# Patient Record
Sex: Male | Born: 1955
Health system: Southern US, Community
[De-identification: ages and names within clinical notes are randomized; demographics above are authoritative.]

## PROBLEM LIST (undated history)

## (undated) DIAGNOSIS — I10 Essential (primary) hypertension: Secondary | ICD-10-CM

## (undated) DIAGNOSIS — I4892 Unspecified atrial flutter: Secondary | ICD-10-CM

## (undated) DIAGNOSIS — G5620 Lesion of ulnar nerve, unspecified upper limb: Secondary | ICD-10-CM

## (undated) DIAGNOSIS — R269 Unspecified abnormalities of gait and mobility: Secondary | ICD-10-CM

## (undated) DIAGNOSIS — K648 Other hemorrhoids: Secondary | ICD-10-CM

## (undated) DIAGNOSIS — D126 Benign neoplasm of colon, unspecified: Secondary | ICD-10-CM

## (undated) DIAGNOSIS — M1990 Unspecified osteoarthritis, unspecified site: Secondary | ICD-10-CM

## (undated) DIAGNOSIS — K760 Fatty (change of) liver, not elsewhere classified: Secondary | ICD-10-CM

## (undated) DIAGNOSIS — K219 Gastro-esophageal reflux disease without esophagitis: Secondary | ICD-10-CM

## (undated) DIAGNOSIS — F1010 Alcohol abuse, uncomplicated: Secondary | ICD-10-CM

## (undated) DIAGNOSIS — M109 Gout, unspecified: Secondary | ICD-10-CM

## (undated) DIAGNOSIS — Z720 Tobacco use: Secondary | ICD-10-CM

## (undated) DIAGNOSIS — R569 Unspecified convulsions: Secondary | ICD-10-CM

## (undated) DIAGNOSIS — R002 Palpitations: Secondary | ICD-10-CM

## (undated) DIAGNOSIS — E785 Hyperlipidemia, unspecified: Secondary | ICD-10-CM

## (undated) DIAGNOSIS — I4891 Unspecified atrial fibrillation: Secondary | ICD-10-CM

## (undated) DIAGNOSIS — I4819 Other persistent atrial fibrillation: Secondary | ICD-10-CM

## (undated) DIAGNOSIS — F419 Anxiety disorder, unspecified: Secondary | ICD-10-CM

## (undated) DIAGNOSIS — I480 Paroxysmal atrial fibrillation: Secondary | ICD-10-CM

## (undated) DIAGNOSIS — I2510 Atherosclerotic heart disease of native coronary artery without angina pectoris: Secondary | ICD-10-CM

## (undated) DIAGNOSIS — I2489 Other forms of acute ischemic heart disease: Secondary | ICD-10-CM

## (undated) DIAGNOSIS — K222 Esophageal obstruction: Secondary | ICD-10-CM

## (undated) DIAGNOSIS — C61 Malignant neoplasm of prostate: Secondary | ICD-10-CM

## (undated) DIAGNOSIS — R011 Cardiac murmur, unspecified: Secondary | ICD-10-CM

## (undated) DIAGNOSIS — I248 Other forms of acute ischemic heart disease: Secondary | ICD-10-CM

## (undated) HISTORY — DX: Atherosclerotic heart disease of native coronary artery without angina pectoris: I25.10

## (undated) HISTORY — DX: Other hemorrhoids: K64.8

## (undated) HISTORY — DX: Unspecified abnormalities of gait and mobility: R26.9

## (undated) HISTORY — DX: Other forms of acute ischemic heart disease: I24.89

## (undated) HISTORY — DX: Unspecified convulsions: R56.9

## (undated) HISTORY — DX: Unspecified atrial flutter: I48.92

## (undated) HISTORY — DX: Benign neoplasm of colon, unspecified: D12.6

## (undated) HISTORY — PX: EYE SURGERY: SHX253

## (undated) HISTORY — DX: Hyperlipidemia, unspecified: E78.5

## (undated) HISTORY — PX: CIRCUMCISION: SUR203

## (undated) HISTORY — DX: Malignant neoplasm of prostate: C61

## (undated) HISTORY — PX: SHOULDER SURGERY: SHX246

## (undated) HISTORY — DX: Fatty (change of) liver, not elsewhere classified: K76.0

## (undated) HISTORY — DX: Lesion of ulnar nerve, unspecified upper limb: G56.20

## (undated) HISTORY — DX: Palpitations: R00.2

## (undated) HISTORY — DX: Essential (primary) hypertension: I10

## (undated) HISTORY — DX: Unspecified osteoarthritis, unspecified site: M19.90

## (undated) HISTORY — DX: Esophageal obstruction: K22.2

## (undated) HISTORY — DX: Paroxysmal atrial fibrillation: I48.0

## (undated) HISTORY — PX: ELBOW SURGERY: SHX618

## (undated) HISTORY — DX: Gastro-esophageal reflux disease without esophagitis: K21.9

## (undated) HISTORY — DX: Anxiety disorder, unspecified: F41.9

## (undated) HISTORY — PX: SHOULDER ARTHROSCOPY: SHX128

## (undated) HISTORY — PX: COLONOSCOPY: SHX174

## (undated) HISTORY — DX: Other forms of acute ischemic heart disease: I24.8

## (undated) HISTORY — DX: Other persistent atrial fibrillation: I48.19

## (undated) HISTORY — PX: HAND SURGERY: SHX662

## (undated) HISTORY — DX: Cardiac murmur, unspecified: R01.1

## (undated) HISTORY — DX: Tobacco use: Z72.0

## (undated) HISTORY — DX: Alcohol abuse, uncomplicated: F10.10

## (undated) HISTORY — DX: Unspecified atrial fibrillation: I48.91

---

## 2005-05-20 ENCOUNTER — Emergency Department: Payer: Self-pay | Admitting: General Practice

## 2008-02-29 ENCOUNTER — Ambulatory Visit: Payer: Self-pay | Admitting: Gastroenterology

## 2008-03-10 ENCOUNTER — Encounter: Payer: Self-pay | Admitting: Gastroenterology

## 2008-03-10 ENCOUNTER — Ambulatory Visit: Payer: Self-pay | Admitting: Gastroenterology

## 2008-03-10 DIAGNOSIS — D126 Benign neoplasm of colon, unspecified: Secondary | ICD-10-CM

## 2008-03-10 HISTORY — DX: Benign neoplasm of colon, unspecified: D12.6

## 2008-03-11 ENCOUNTER — Encounter: Payer: Self-pay | Admitting: Gastroenterology

## 2009-12-01 ENCOUNTER — Encounter: Payer: Self-pay | Admitting: Cardiology

## 2009-12-08 ENCOUNTER — Encounter: Payer: Self-pay | Admitting: Cardiology

## 2009-12-16 ENCOUNTER — Ambulatory Visit: Payer: Self-pay | Admitting: Cardiology

## 2009-12-16 DIAGNOSIS — I4949 Other premature depolarization: Secondary | ICD-10-CM

## 2009-12-16 DIAGNOSIS — I10 Essential (primary) hypertension: Secondary | ICD-10-CM | POA: Insufficient documentation

## 2009-12-23 ENCOUNTER — Encounter: Payer: Self-pay | Admitting: Cardiology

## 2009-12-25 ENCOUNTER — Encounter: Payer: Self-pay | Admitting: Cardiology

## 2009-12-25 ENCOUNTER — Ambulatory Visit: Payer: Self-pay

## 2009-12-25 ENCOUNTER — Ambulatory Visit (HOSPITAL_COMMUNITY)
Admission: RE | Admit: 2009-12-25 | Discharge: 2009-12-25 | Payer: Self-pay | Source: Home / Self Care | Attending: Cardiology | Admitting: Cardiology

## 2009-12-25 ENCOUNTER — Ambulatory Visit: Payer: Self-pay | Admitting: Cardiology

## 2009-12-31 ENCOUNTER — Telehealth: Payer: Self-pay | Admitting: Cardiology

## 2009-12-31 ENCOUNTER — Ambulatory Visit: Payer: Self-pay | Admitting: Cardiology

## 2010-01-01 ENCOUNTER — Encounter: Payer: Self-pay | Admitting: Cardiology

## 2010-01-21 ENCOUNTER — Ambulatory Visit
Admission: RE | Admit: 2010-01-21 | Discharge: 2010-01-21 | Payer: Self-pay | Source: Home / Self Care | Attending: Cardiology | Admitting: Cardiology

## 2010-01-21 DIAGNOSIS — E785 Hyperlipidemia, unspecified: Secondary | ICD-10-CM | POA: Insufficient documentation

## 2010-02-11 NOTE — Letter (Signed)
Summary: Self-Recorded Vitals  Self-Recorded Vitals   Imported By: Marylou Mccoy 12/25/2009 11:57:26  _____________________________________________________________________  External Attachment:    Type:   Image     Comment:   External Document  Appended Document: Self-Recorded Vitals BP improved.   Appended Document: Self-Recorded Vitals I discussed with pt by telephone

## 2010-02-11 NOTE — Letter (Signed)
Summary: [employer] Clinical Forms  [employer] Clinical Forms   Imported By: Marylou Mccoy 12/28/2009 18:38:55  _____________________________________________________________________  External Attachment:    Type:   Image     Comment:   External Document

## 2010-02-11 NOTE — Progress Notes (Signed)
Summary: monitor results 12/25/09  Phone Note Outgoing Call   Call placed by: Katina Dung, RN, BSN,  December 31, 2009 12:52 PM Call placed to: Patient Summary of Call: monitor report  Follow-up for Phone Call        Dr Shirlee Latch reviewed monitor report done 12/25/09---artifact, occ PACs and PVCs , no significant arrhythmias--LMTCB Katina Dung, RN, BSN  December 31, 2009 12:54 PM --pt notifed of results by telephone

## 2010-02-11 NOTE — Assessment & Plan Note (Signed)
Summary: np6/htn/jml   Primary Provider:  Loma Sender, MD  CC:  pt states he has a heart mummur...pt is getting checked out today. He also states he had high BP was on lisinopril-hctz now on bystolic..he states he feels better on this med.  History of Present Illness: 55 yo with history of HTN presents for cardiology evaluation.  He had been planned for a knee arthroscopy about a week ago.  The surgery was delayed that day, and he was getting anxious.  By the time they took him back for surgery, BP was as high as 220/135, and he was noted to have PVCs with trigeminy.  Surgery was cancelled for that day.  He saw Dr. Vear Clock soon after, and his lisinopril/HCTZ was stopped.  Instead, he was begun on nebivolol.  Today, BP is still mildly elevated at 142/86.    Patient has good exercise tolerance except for the pain in his knee.  He denies any exertional chest pain or dyspnea.  He walks 2-3 miles for exercise several times a week. At work, he climbs steps frequently and has to go up ladders.  No lightheadedness or palpitations, no syncope.  He has been told for a number of years that he has a heart murmur.  He also continues to smoke about 1 ppd.    ECG: NSR, normal  Current Medications (verified): 1)  Bystolic 10 Mg Tabs (Nebivolol Hcl) .Marland Kitchen.. 1 Tab By Mouth Once Daily 2)  Multivitamins   Tabs (Multiple Vitamin) .Marland Kitchen.. 1 Tab By Mouth Once Daily 3)  Fish Oil   Oil (Fish Oil) .Marland Kitchen.. 1 Tab By Mouth Once Daily 4)  Vit B12 .Marland Kitchen.. 1 Tab By Mouth Once Daily 5)  Garlic .Marland Kitchen.. 1 Tab By Mouth Once Daily  Allergies: No Known Drug Allergies  Past History:  Past Medical History: 1.  HTN 2.  Heart murmur 3.  Anxiety 4.  Holter monitor 2004 with occasional PVCs 5.  Nuclear stress test 12 years ago: normal per patient.  6.  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   Vital Signs:  Patient profile:   55 year old male Height:      72 inches Weight:      217 pounds BMI:     29.54 Pulse rate:   65 / minute Resp:     14 per minute BP sitting:   142 / 86  (left arm)  Vitals Entered By: Kem Parkinson (December 16, 2009 9:47 AM)  Physical Exam  General:  Well developed, well nourished, in no acute distress. Head:  normocephalic and atraumatic Nose:  no deformity, discharge, inflammation, or lesions Mouth:  Teeth, gums and palate normal. Oral mucosa normal. Neck:  Neck supple, no JVD. No masses, thyromegaly or abnormal cervical nodes. Lungs:  Clear bilaterally to auscultation and percussion. Heart:  Non-displaced PMI, chest non-tender; regular rate and rhythm, S1, S2. +S4.  1/6 systolic murmur along the sternal border. Carotid upstroke normal, no bruit. Pedals normal pulses. No edema, no varicosities. Abdomen:  Bowel sounds positive; abdomen soft and non-tender without masses, organomegaly, or hernias noted. No hepatosplenomegaly. Extremities:  No clubbing or cyanosis. Neurologic:  Alert and oriented x 3. Skin:  Intact without lesions or rashes. Psych:  Normal affect.   Impression &  Recommendations:  Problem # 1:  HYPERTENSION (ICD-401.9) Blood pressure is still elevated today.  I will have him add lisinopril 10 mg daily to nebivolol.  Will get BMET in 10 days and BP check at that time.    Problem # 2:  CARDIAC MURMUR (ICD-785.2) Slight systolic murmur that has been noted in the past.  Suspect benign.  As he also has significant HTN, I think that doing an echo to assess LV function and valves is reasonable.    Problem # 3:  PREMATURE BEATS (ICD-427.60) PVCs noted at the time of surgery.  He also per report had PVCs on a holter back in 2004.  Nebivolol should help suppress the PVCs.  I will have him get a 24 hour holter to assess for any more significant arrhythmias, and as  above, I will get an echo to assess for any cardiac structural abnormalities that might predispose to PVCs.    Problem # 4:  SMOKING I strongly encouraged him to quit.  He plans on entering a smoking cessation program at work.   I will check lipids/LFTs and will see him back in a month.  Other Orders: Echocardiogram (Echo) Holter Monitor (Holter Monitor)  Patient Instructions: 1)  Take and record your blood pressure about 2 hours after you take your medication. Fax the readings to [phone]  Luana Shu  2)  Fasting lab in 2 weeks--lipid profile/liver profile/BMP 401.9 427.60 you have the order--please fax the results to [phone]. 3)  Your physician has requested that you have an echocardiogram.  Echocardiography is a painless test that uses sound waves to create images of your heart. It provides your doctor with information about the size and shape of your heart and how well your heart's chambers and valves are working.  This procedure takes approximately one hour. There are no restrictions for this procedure. 4)  Your physician has recommended that you wear a holter monitor.  Holter monitors are medical devices that record the heart's electrical activity. Doctors most often use these monitors to diagnose arrhythmias. Arrhythmias are problems with the speed or rhythm of the heartbeat. The monitor is a small, portable device. You can wear one while you do your normal daily activities. This is usually used to diagnose what is causing palpitations/syncope (passing out). 24 hour monitor 5)  Your physician recommends that you schedule a follow-up appointment in: 1 month with Dr Shirlee Latch. Prescriptions: LISINOPRIL 10 MG TABS (LISINOPRIL) one daily  #30 x 6   Entered by:   Katina Dung, RN, BSN   Authorized by:   Marca Ancona, MD   Signed by:   Katina Dung, RN, BSN on 12/16/2009   Method used:   Electronically to        [facility]* (retail)       [address]        [place], [place]  [address]       Ph: [phone]       Fax: [phone]   RxID:   [id number] BYSTOLIC 10 MG TABS (NEBIVOLOL HCL) 1 tab by mouth once daily  #30 x 6   Entered by:   Katina Dung, RN, BSN   Authorized by:   Marca Ancona, MD   Signed by:   Katina Dung, RN, BSN on 12/16/2009   Method used:   Electronically to        [facility]* (retail)       [address]       [place], [place]  [address]  Ph: [phone]       Fax: [phone]   RxID:   [id number]

## 2010-02-11 NOTE — Assessment & Plan Note (Signed)
Summary: f64m   Visit Type:  1 month follow up Primary Provider:  Loma Sender, MD  CC:  no complaints.  History of Present Illness: 55 yo with history of HTN and PVCs presents for cardiology followup.  Holter monitor in 12/11 showed PACs and PVCs.  Palpitations seem to be well-suppressed by nebivolol.  BP is 186/92 in the office today but has been running systolic 130s when he gets the nurse at his workplace to check it.  He is still smoking.  No chest pain, no exertional dyspnea.  Echo showed mild LV hypertrophy with normal LV systolic function.   Labs (12/11): K 4.7, creatinine 0.96, TGs 268, LDL 183, HDL 57  Current Medications (verified): 1)  Bystolic 10 Mg Tabs (Nebivolol Hcl) .Marland Kitchen.. 1 Tab By Mouth Once Daily 2)  Multivitamins   Tabs (Multiple Vitamin) .Marland Kitchen.. 1 Tab By Mouth Once Daily 3)  Fish Oil   Oil (Fish Oil) .... 3  Tab By Mouth Once Daily 4)  Vit B12 .Marland Kitchen.. 1 Tab By Mouth Once Daily 5)  Garlic .Marland Kitchen.. 1 Tab By Mouth Once Daily 6)  Lisinopril 10 Mg Tabs (Lisinopril) .... One Daily  Allergies (verified): No Known Drug Allergies  Past History:  Past Medical History: 1.  HTN 2.  Echo (12/11): EF 60-65%, mild LVH, no regional WMAs, grade I diastolic dysfunction, mild LAE 3.  Anxiety 4.  Palpitations: Holter monitor 2004 with occasional PVCs. Holter (12/11) with PACs and PVCs (not common).  5.  Nuclear stress test 12 years ago: normal per patient.  6.  Active smoker, 1 ppd 7.  Hyperlipidemia.  Myalgias with Lipitor.   Family History: Reviewed history from 12/16/2009 and no changes required. No premature CAD  Social History: Reviewed history from 12/16/2009 and no changes required. 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   Vital Signs:  Patient profile:   55 year old male Height:      72 inches Weight:      217.25 pounds BMI:      29.57 Pulse rate:   71 / minute BP sitting:   186 / 92  (left arm) Cuff size:   regular  Vitals Entered By: Caralee Ates CMA (January 21, 2010 4:21 PM)  Physical Exam  General:  Well developed, well nourished, in no acute distress. Neck:  Neck supple, no JVD. No masses, thyromegaly or abnormal cervical nodes. Lungs:  Clear bilaterally to auscultation and percussion. Heart:  Non-displaced PMI, chest non-tender; regular rate and rhythm, S1, S2. +S4.  1/6 systolic murmur along the sternal border. Carotid upstroke normal, no bruit. Pedals normal pulses. No edema, no varicosities. Abdomen:  Bowel sounds positive; abdomen soft and non-tender without masses, organomegaly, or hernias noted. No hepatosplenomegaly. Extremities:  No clubbing or cyanosis. Neurologic:  Alert and oriented x 3. Psych:  Normal affect.   Impression & Recommendations:  Problem # 1:  HYPERTENSION (ICD-401.9) BP high today but always good when checked in the morning at work.  I am going to have him get his BP checked in the afternoon at work daily to see what it is running.  We will give him a call in about 10 days to see how high BP is running, next step would be to increase lisinopril.   Problem # 2:  PREMATURE BEATS (ICD-427.60) Palpitations were likely due to PVCs.  They  are much improved on nebivolol.   Problem # 3:  HYPERLIPIDEMIA-MIXED (ICD-272.4) LDL very high.  Patient had myalgias in the past on Lipitor. I will have him start pravastatin at 40 mg daily with coenzyme Q10 100 mg daily. Lipids/LFTs in 2 months.   Problem # 4:  SMOKING Needs to quit, will try electronic cigarette.   Problem # 5:  CAD RISK Patient has multiple risk factors for CAD including hyperlipidemia, HTN, and smoking.  We are going to focus on risk factor modification.  He will need to take ASA 81 mg daily.   Patient Instructions: 1)  Start Pravastatin 40mg  once daily. 2)  Take Aspirin 81mg  once daily. 3)  Start Co-Enzyme Q10 100mg  once  daily. 4)  Return for FASTING labwork in 2 months (around 03/22/10): lipid/liver  5)  Check and record your blood pressure for the next 10 days. Dr. Alford Highland nurse, Katina Dung, will call you to followup on those readings. 6)  Your physician wants you to follow-up in:  1 year. You will receive a reminder letter in the mail two months in advance. If you don't receive a letter, please call our office to schedule the follow-up appointment. Prescriptions: PRAVASTATIN SODIUM 40 MG TABS (PRAVASTATIN SODIUM) Take one tablet by mouth daily at bedtime  #30 x 6   Entered by:   Sherri Rad, RN, BSN   Authorized by:   Marca Ancona, MD   Signed by:   Sherri Rad, RN, BSN on 01/21/2010   Method used:   Electronically to        [facility]* (retail)       [address]       [place], [place]  [address]       Ph: [phone]       Fax: [phone]   RxID:   [id number]

## 2010-02-11 NOTE — Procedures (Signed)
Summary: summary report  summary report   Imported By: Mirna Mires 01/01/2010 13:39:20  _____________________________________________________________________  External Attachment:    Type:   Image     Comment:   External Document

## 2010-07-01 ENCOUNTER — Encounter: Payer: Self-pay | Admitting: Gastroenterology

## 2010-07-21 ENCOUNTER — Other Ambulatory Visit: Payer: Self-pay | Admitting: *Deleted

## 2010-07-21 MED ORDER — NEBIVOLOL HCL 10 MG PO TABS: 10.0000 mg | ORAL_TABLET | Freq: Every day | ORAL | Status: DC

## 2010-07-21 MED ORDER — LISINOPRIL 10 MG PO TABS: 10.0000 mg | ORAL_TABLET | Freq: Every day | ORAL | Status: DC

## 2010-07-22 ENCOUNTER — Telehealth: Payer: Self-pay | Admitting: Cardiology

## 2010-07-22 NOTE — Telephone Encounter (Signed)
Left message for Pt that his refills are ready at the pharmacy, they were called in yesterday and I verified that with the pharmacy

## 2010-07-22 NOTE — Telephone Encounter (Signed)
Pt needs refill of bystolic 10mg  and lisinopril 10mg , [facility] said pharmacy requested two days ago, almost out needs asap

## 2010-08-12 ENCOUNTER — Ambulatory Visit (INDEPENDENT_AMBULATORY_CARE_PROVIDER_SITE_OTHER): Payer: Managed Care, Other (non HMO) | Admitting: Gastroenterology

## 2010-08-12 ENCOUNTER — Encounter: Payer: Self-pay | Admitting: Gastroenterology

## 2010-08-12 VITALS — BP 132/84 | HR 68 | Ht 72.5 in | Wt 203.0 lb

## 2010-08-12 DIAGNOSIS — Z86010 Personal history of colonic polyps: Secondary | ICD-10-CM

## 2010-08-12 DIAGNOSIS — K6289 Other specified diseases of anus and rectum: Secondary | ICD-10-CM

## 2010-08-12 DIAGNOSIS — K648 Other hemorrhoids: Secondary | ICD-10-CM

## 2010-08-12 MED ORDER — HYDROCORTISONE ACETATE 25 MG RE SUPP: 25.0000 mg | Freq: Every day | RECTAL | Status: AC

## 2010-08-12 NOTE — Progress Notes (Signed)
History of Present Illness: This is a 55 year old man who relates a 2 to 3 month history of recurrent rectal pain. He occasionally has noted a small amount of blood on the tissue paper when wiping. Symptoms began following a move to a new home and significant work around the home. He states his symptoms have responded completely to Preparation H suppositories however the symptoms return after discontinuing the suppositories. He does not note pain with bowel movements. Overall his symptoms have improved substantially now he only has minimal symptoms. He notes no change in stool caliber, change in bowel habits, abdominal pain. He underwent colonoscopy in March 2010 showing internal hemorrhoids and small adenomatous colon polyps.  Current Medications, Allergies, Past Medical History, Past Surgical History, Family History and Social History were reviewed in [employer] record.  Physical Exam: General: Well developed , well nourished, no acute distress Head: Normocephalic and atraumatic Eyes:  sclerae anicteric, EOMI Ears: Normal auditory acuity Mouth: No deformity or lesions Lungs: Clear throughout to auscultation Heart: Regular rate and rhythm; no murmurs, rubs or bruits Abdomen: Soft, non tender and non distended. No masses, hepatosplenomegaly or hernias noted. Normal Bowel sounds Rectal: No external or internal lesions. Tender anal canal on digital examination Hemoccult negative stool. Musculoskeletal: Symmetrical with no gross deformities  Pulses:  Normal pulses noted Extremities: No clubbing, cyanosis, edema or deformities noted Neurological: Alert oriented x 4, grossly nonfocal Psychological:  Alert and cooperative. Normal mood and affect  Assessment and Recommendations:  1. Rectal pain and small volume hematochezia responsive to Preparation H. History of internal hemorrhoids on colonoscopy in 2010. I presume his symptoms are all secondary to internal hemorrhoids. Begin  all standard rectal care instructions and Anusol HC suppositories for one week and then may use when necessary. If his symptoms worsen or do not completely resolve, he is to call for further evaluation and advice.  2. Personal history of adenomatous colon polyps. Surveillance colonoscopy recommended March 2015.

## 2010-08-12 NOTE — Patient Instructions (Signed)
Sitz Bath A sitz bath is a warm water bath taken in the sitting position that covers only the hips and buttocks. It may be used for either healing or hygiene purposes. The water may contain medication. Sitz baths are often used to relieve pain, itching or muscle spasms. Moist heat will help you heal and relax. Follow these steps carefully: HOME CARE INSTRUCTIONS  Fill the bathtub half full with warm water. Not Too Hot!   Sit in the water and open the drain a little.   Turn on the warm water to keep the tub half full. Keep the water running constantly.   Soak in the water for 15 to 20 minutes.   After the sitz bath, blot dry the affected part first.   Take 3 to 4 sitz baths a day.  SEEK MEDICAL CARE IF: You get worse instead of better; stop the sitz baths. Document Released: 09/19/2003 Document Re-Released: 01/18/2009 [facility] Patient Information 2011 [place], [place].  Rectal care instructions given.  A prescription for Anusol HC suppositories have been sent to your pharmacy.

## 2010-11-28 ENCOUNTER — Emergency Department: Payer: Self-pay | Admitting: *Deleted

## 2010-12-10 ENCOUNTER — Ambulatory Visit (INDEPENDENT_AMBULATORY_CARE_PROVIDER_SITE_OTHER): Payer: Managed Care, Other (non HMO) | Admitting: Gastroenterology

## 2010-12-10 ENCOUNTER — Encounter: Payer: Self-pay | Admitting: Gastroenterology

## 2010-12-10 VITALS — BP 150/90 | HR 80 | Ht 73.0 in | Wt 198.4 lb

## 2010-12-10 DIAGNOSIS — R1319 Other dysphagia: Secondary | ICD-10-CM

## 2010-12-10 DIAGNOSIS — K219 Gastro-esophageal reflux disease without esophagitis: Secondary | ICD-10-CM

## 2010-12-10 MED ORDER — ESOMEPRAZOLE MAGNESIUM 40 MG PO CPDR: 40.0000 mg | DELAYED_RELEASE_CAPSULE | Freq: Every day | ORAL | Status: DC

## 2010-12-10 NOTE — Patient Instructions (Addendum)
Our available days for an Upper Endoscopy with propofol are 01/18/11 and 02/02/11. Please call back to schedule this appointment after you speak with your wife.  Also pick up your prescription for Nexium at your pharmacy.  Patient advised to avoid spicy, acidic, citrus, chocolate, mints, fruit and fruit juices.  Limit the intake of caffeine, alcohol and Soda.  Don't exercise too soon after eating.  Don't lie down within 3-4 hours of eating.  Elevate the head of your bed.  cc: Loma Sender, MD

## 2010-12-10 NOTE — Progress Notes (Signed)
History of Present Illness: This is a 55 year old male who relates worsening reflux symptoms over the past few years with several episodes of solid food dysphagia over the past few months. He has taken over-the-counter Prilosec on an as needed basis for his symptoms. One episode of dysphagia occurred 2 weeks ago, persisted for several hours and was associated with chest pain. He was evaluated at [facility] ED no other cause of chest pain was uncovered and the symptoms eventually responded to some type of muscle relaxer per patient's report and his dysphagia resolved. He was started on Nexium 40 mg daily and has been on this medication for the past 2 weeks with improved control his reflux symptoms. He has been avoiding steak since then. Denies weight loss, abdominal pain, constipation, diarrhea, change in stool caliber, melena, hematochezia, nausea, vomiting.  Current Medications, Allergies, Past Medical History, Past Surgical History, Family History and Social History were reviewed in [employer] record.  Physical Exam: General: Well developed , well nourished, no acute distress Head: Normocephalic and atraumatic Eyes:  sclerae anicteric, EOMI Ears: Normal auditory acuity Mouth: No deformity or lesions Lungs: Clear throughout to auscultation Heart: Regular rate and rhythm; no murmurs, rubs or bruits Abdomen: Soft, non tender and non distended. No masses, hepatosplenomegaly or hernias noted. Normal Bowel sounds Musculoskeletal: Symmetrical with no gross deformities  Pulses:  Normal pulses noted Extremities: No clubbing, cyanosis, edema or deformities noted Neurological: Alert oriented x 4, grossly nonfocal Psychological:  Alert and cooperative. Normal mood and affect  Assessment and Recommendations:  1. Dysphagia and GERD. Rule out peptic stricture, esophagitis and upper gastrointestinal tract neoplasms. Continue Nexium 40 mg every morning. Begin all standard and reflux measures.  The risks, benefits, and alternatives to endoscopy with possible biopsy and possible dilation were discussed with the patient and they consent to proceed. Due to his work schedule the patient would like to schedule the endoscopy in January. I advised him to be very careful eating meats and breads until his endoscopy is completed.

## 2010-12-13 ENCOUNTER — Encounter: Payer: Self-pay | Admitting: Gastroenterology

## 2011-01-25 ENCOUNTER — Ambulatory Visit (AMBULATORY_SURGERY_CENTER): Payer: Managed Care, Other (non HMO)

## 2011-01-25 VITALS — Ht 73.0 in | Wt 202.0 lb

## 2011-01-25 DIAGNOSIS — K219 Gastro-esophageal reflux disease without esophagitis: Secondary | ICD-10-CM

## 2011-01-25 DIAGNOSIS — R1319 Other dysphagia: Secondary | ICD-10-CM

## 2011-02-02 ENCOUNTER — Ambulatory Visit (AMBULATORY_SURGERY_CENTER): Payer: Managed Care, Other (non HMO) | Admitting: Gastroenterology

## 2011-02-02 ENCOUNTER — Encounter: Payer: Self-pay | Admitting: Gastroenterology

## 2011-02-02 DIAGNOSIS — K219 Gastro-esophageal reflux disease without esophagitis: Secondary | ICD-10-CM

## 2011-02-02 DIAGNOSIS — R1319 Other dysphagia: Secondary | ICD-10-CM

## 2011-02-02 MED ORDER — SODIUM CHLORIDE 0.9 % IV SOLN: 500.0000 mL | INTRAVENOUS | Status: DC

## 2011-02-02 NOTE — Patient Instructions (Signed)
Please follow the ESOPHAGEAL DILATION DIET as follows:  -Nothing by mouth until 12:15pm  -Clear Liquids for 1 hour 12:15pm-1:15pm  -Soft foods for the rest of today 1:15 until tomorrow  Esophageal Stricture The esophagus is the long, narrow tube which carries food and liquid from the mouth to the stomach. Sometimes a part of the esophagus becomes narrow and makes it difficult, painful, or even impossible to swallow. This is called an esophageal stricture.  CAUSES  Common causes of blockage or strictures of the esophagus are:  Exposure of the lower esophagus to the acid from the stomach may cause narrowing.   Hiatal hernia in which a small part of the stomach bulges up through the diaphragm can cause a narrowing in the bottom of the esophagus.   Scleroderma is a tissue disorder that affects the esophagus and makes swallowing difficult.   Achalasia is an absence of nerves in the lower esophagus and to the esophageal sphincter. This absence of nerves may be congenital (present since birth). This can cause irregular spasms which do not allow food and fluid through.   Strictures may develop from swallowing materials which damage the esophagus. Examples are acids or alkalis such as lye.   Schatzki's Ring is a narrow ring of non-cancerous tissue which narrows the lower esophagus. The cause of this is unknown.   Growths can block the esophagus.  SYMPTOMS  Some of the problems are difficulty swallowing or pain with swallowing. DIAGNOSIS  Your caregiver often suspects this problem by taking a medical history. They will also do a physical exam. They may then take X-rays and/or perform an endoscopy. Endoscopy is an exam in which a tube like a small flexible telescope is used to look at your esophagus.  TREATMENT  One form of treatment is to dilate the narrow area. This means to stretch it.   When this is not successful, chest surgery may be required. This is a much more extensive form of treatment  with a longer recovery time.  Both of the above treatments make the passage of food and water into the stomach easier. They also make it easier for stomach contents to bubble back into the esophagus. Special medications may be used following the procedure to help prevent further narrowing. Medications may be used to lower the amount of acid in the stomach juice.  SEEK IMMEDIATE MEDICAL CARE IF:   Your swallowing is becoming more painful, difficult, or you are unable to swallow.   You vomit up blood.   You develop black tarry stools.   You develop chills.   You have a fever.   You develop chest or abdominal pain.   You develop shortness of breath, feel lightheaded, or faint.  Follow up with medical care as your caregiver suggests. Document Released: 09/06/2005 Document Revised: 09/08/2010 Document Reviewed: 10/13/2005 [facility] Patient Information 2012 [place], [place].

## 2011-02-02 NOTE — Progress Notes (Signed)
Patient did not experience any of the following events: a burn prior to discharge; a fall within the facility; wrong site/side/patient/procedure/implant event; or a hospital transfer or hospital admission upon discharge from the facility. (G8907) Patient did not have preoperative order for IV antibiotic SSI prophylaxis. (G8918)  

## 2011-02-02 NOTE — Op Note (Signed)
[facility] [address] [place], [place]  [address]  ENDOSCOPY PROCEDURE REPORT  PATIENT:  Peter [name], Peter [name]  MR#:  [id number] BIRTHDATE:  Mar 16, 1955, 55 yrs. old  GENDER:  male ENDOSCOPIST:  Judie Petit T. Russella Dar, MD, [facility] Referred by:  Loma Sender, M.D. PROCEDURE DATE:  02/02/2011 PROCEDURE:  EGD with dilatation over guidewire ASA CLASS:  Class II INDICATIONS:  1) dysphagia  2) GERD MEDICATIONS:   MAC sedation, administered by CRNA, propofol (Diprivan) 300 mg IV TOPICAL ANESTHETIC:  none DESCRIPTION OF PROCEDURE:   After the risks benefits and alternatives of the procedure were thoroughly explained, informed consent was obtained.  The [id number] endoscope was introduced through the mouth and advanced to the second portion of the duodenum, without limitations.  The instrument was slowly withdrawn as the mucosa was carefully examined. <<PROCEDUREIMAGES>> A stricture was found at the gastroesophageal junction. It was subtle and  benign appearing  The stomach was entered and closely examined. The pylorus, antrum, angularis, and lesser curvature were well visualized, including a retroflexed view of the cardia and fundus. The stomach wall was normally distensable. The scope passed easily through the pylorus into the duodenum.  The duodenal bulb was normal in appearance, as was the postbulbar duodenum. The examination was otherwise normal.  Dilation was then performed at the gastroesphageal junction: Dilator:  Savary over guidewire  Size:  17 mm Resistance:  none  Heme:  none  COMPLICATIONS:  None  ENDOSCOPIC IMPRESSION: 1) Stricture at the gastroesophageal junction  RECOMMENDATIONS: 1) anti-reflux regimen 2) continue PPI 3) post dilation instructions  Mahalia Dykes T. Russella Dar, MD, [name]  n. eSIGNED:   Venita Lick. Jay Kempe at 02/02/2011 11:07 AM  Greig Castilla, [phone]

## 2011-02-03 ENCOUNTER — Telehealth: Payer: Self-pay | Admitting: *Deleted

## 2011-02-03 NOTE — Telephone Encounter (Signed)

## 2011-02-04 ENCOUNTER — Other Ambulatory Visit: Payer: Managed Care, Other (non HMO) | Admitting: Gastroenterology

## 2011-02-23 ENCOUNTER — Ambulatory Visit: Payer: Self-pay

## 2011-03-04 ENCOUNTER — Other Ambulatory Visit: Payer: Self-pay | Admitting: Cardiology

## 2011-04-04 ENCOUNTER — Encounter: Payer: Self-pay | Admitting: Cardiology

## 2011-04-04 ENCOUNTER — Ambulatory Visit (INDEPENDENT_AMBULATORY_CARE_PROVIDER_SITE_OTHER): Payer: Managed Care, Other (non HMO) | Admitting: Cardiology

## 2011-04-04 VITALS — BP 204/108 | HR 80 | Ht 73.0 in | Wt 201.0 lb

## 2011-04-04 DIAGNOSIS — F17200 Nicotine dependence, unspecified, uncomplicated: Secondary | ICD-10-CM

## 2011-04-04 DIAGNOSIS — E785 Hyperlipidemia, unspecified: Secondary | ICD-10-CM

## 2011-04-04 DIAGNOSIS — I10 Essential (primary) hypertension: Secondary | ICD-10-CM

## 2011-04-04 MED ORDER — LISINOPRIL 20 MG PO TABS: 20.0000 mg | ORAL_TABLET | Freq: Every day | ORAL | Status: DC

## 2011-04-04 MED ORDER — NEBIVOLOL HCL 10 MG PO TABS: 10.0000 mg | ORAL_TABLET | Freq: Every day | ORAL | Status: DC

## 2011-04-04 NOTE — Progress Notes (Signed)
PCP: Loma Sender  56 yo with history of HTN and PVCs presents for cardiology followup. Holter monitor in 12/11 showed PACs and PVCs. Palpitations seem to be well-suppressed by nebivolol. BP is 204/108 in the office today but runs in the 120s-130s/80s-90 when he checks it at home or has the nurse at his workplace check it. He is still smoking 7-8 cigarettes/day. No chest pain, no exertional dyspnea.  He was taking pravastatin in the past but stopped it.  He was not having any particular side effects from it.   Labs (12/11): K 4.7, creatinine 0.96, TGs 268, LDL 183, HDL 57  Labs (11/12): creatinine 0.67  ECG: NSR, LAFB  Allergies (verified):  No Known Drug Allergies   Past Medical History:  1. HTN  2. Echo (12/11): EF 60-65%, mild LVH, no regional WMAs, grade I diastolic dysfunction, mild LAE  3. Anxiety  4. Palpitations: Holter monitor 2004 with occasional PVCs. Holter (12/11) with PACs and PVCs (not common).  5. Nuclear stress test > 10 years ago: normal per patient.  6. Active smoker 7. Hyperlipidemia. Myalgias with Lipitor.  8. GERD with history of esophageal stricture and dilation.   Family History:  No premature CAD   Social History:  Smokes 7-8 cigs/day Lives in [place], married  Teaching laboratory technician for [employer]  Review of Systems  All systems reviewed and negative except as per HPI.   Current Outpatient Prescriptions  Medication Sig Dispense Refill  . aspirin 81 MG tablet Take 81 mg by mouth daily.      . busPIRone (BUSPAR) 30 MG tablet Take 45 mg by mouth every morning.        Marland Kitchen esomeprazole (NEXIUM) 40 MG capsule Take 1 capsule (40 mg total) by mouth daily.  30 capsule  11  . fish oil-omega-3 fatty acids 1000 MG capsule Take 2 g by mouth daily. Take 3  daily      . glucosamine-chondroitin 500-400 MG tablet Take 1 tablet by mouth daily. Take 2 tabs daily      . hydrocortisone (ANUSOL-HC) 25 MG suppository Place 1 suppository (25 mg total)  rectally at bedtime.  12 suppository  1  . Melatonin 5 MG CAPS Take by mouth at bedtime as needed.      . Multiple Vitamin (MULTIVITAMIN) tablet Take 1 tablet by mouth daily. Centrum Plus -Take 1 daily      . nebivolol (BYSTOLIC) 10 MG tablet Take 1 tablet (10 mg total) by mouth daily.  30 tablet  11  . NON FORMULARY Muscadine seed- Take 2 daily      . vitamin B-12 (CYANOCOBALAMIN) 250 MCG tablet Take 250 mcg by mouth daily.      Marland Kitchen lisinopril (PRINIVIL,ZESTRIL) 20 MG tablet Take 1 tablet (20 mg total) by mouth daily.  30 tablet  11  . nicotine (NICODERM CQ - DOSED IN MG/24 HOURS) 14 mg/24hr patch Place 1 patch onto the skin daily.        BP 204/108  Pulse 80  Ht 6\' 1"  (1.854 m)  Wt 201 lb (91.173 kg)  BMI 26.52 kg/m2 General: NAD Neck: No JVD, no thyromegaly or thyroid nodule.  Lungs: Clear to auscultation bilaterally with normal respiratory effort. CV: Nondisplaced PMI.  Heart regular S1/S2, soft S4, no murmur.  No peripheral edema.  No carotid bruit.  Normal pedal pulses.  Abdomen: Soft, nontender, no hepatosplenomegaly, no distention.  Neurologic: Alert and oriented x 3.  Psych: Normal affect. Extremities: No clubbing or cyanosis.

## 2011-04-04 NOTE — Patient Instructions (Signed)
Increase lisinopril to 20mg  daily.  Use a nicotine patch 14mg  daily to help you stop smoking. You can get this without a prescription.  Your physician recommends that you return for lab work in: 10-14 days. You have the order. Please fax the results to Dr Shirlee Latch [phone].  Take and record your blood pressure daily. I will call you in 2 weeks to get the readings. Luana Shu [phone]  Your physician wants you to follow-up in: 1 year with Dr Shirlee Latch. (March 2014).You will receive a reminder letter in the mail two months in advance. If you don't receive a letter, please call our office to schedule the follow-up appointment.

## 2011-04-05 DIAGNOSIS — Z720 Tobacco use: Secondary | ICD-10-CM | POA: Insufficient documentation

## 2011-04-05 NOTE — Assessment & Plan Note (Signed)
I will check lipids.  They were quite high in the past.  Suspect that I will have him restart pravastatin.

## 2011-04-05 NOTE — Assessment & Plan Note (Signed)
I strongly encouraged him to quit smoking.  He will try nicotine patches.

## 2011-04-05 NOTE — Assessment & Plan Note (Signed)
BP is very high here.  However, it is only rarely elevated when he checks it at home or has the nurse at work check it. I am going to increase his lisinopril to 20 mg daily today.  He will keep track of his BP with this change and we will call in 2 wks to see how it is running.

## 2011-04-19 ENCOUNTER — Telehealth: Payer: Self-pay | Admitting: *Deleted

## 2011-04-19 NOTE — Telephone Encounter (Signed)
HYPERTENSION - Marca Ancona, MD 04/05/2011 10:41 PM Signed  BP is very high here. However, it is only rarely elevated when he checks it at home or has the nurse at work check it. I am going to increase his lisinopril to 20 mg daily today. He will keep track of his BP with this change and we will call in 2 wks to see how it is running  04/19/11 LMTCB--I have not seen lab results.

## 2011-04-21 NOTE — Telephone Encounter (Signed)
Pt will fax BP readings to Dr Shirlee Latch tomorrow. He is going to have BMET on Monday 04/25/11.

## 2011-04-21 NOTE — Telephone Encounter (Signed)
Spoke with pt. Pt states he does not have BP readings with him.

## 2011-04-26 NOTE — Telephone Encounter (Signed)
Spoke with pt. Pt will drop BP readings off at our office tomorrow. He will have BMET on 04/29/11 at [facility].

## 2011-05-03 NOTE — Telephone Encounter (Signed)
I received BP readings from pt for Dr Shirlee Latch to review. LM for pt to let him know I did receive the readings.

## 2011-05-04 ENCOUNTER — Encounter: Payer: Self-pay | Admitting: Cardiology

## 2011-05-11 ENCOUNTER — Other Ambulatory Visit: Payer: Self-pay | Admitting: *Deleted

## 2011-05-11 ENCOUNTER — Telehealth: Payer: Self-pay | Admitting: Cardiology

## 2011-05-11 MED ORDER — ROSUVASTATIN CALCIUM 20 MG PO TABS: 20.0000 mg | ORAL_TABLET | Freq: Every day | ORAL | Status: DC

## 2011-05-11 NOTE — Telephone Encounter (Signed)
Spoke with pt about BP readings.

## 2011-05-11 NOTE — Telephone Encounter (Signed)
Spoke with pt about recent lab results--05/04/11 T Chol 319 Trig 95 HDL 69 LDL 231. Pt will start crestor 20mg  daily per Dr Shirlee Latch.  I will mail pt an order for fasting lipid/liver profile in 2 months.

## 2011-05-11 NOTE — Telephone Encounter (Signed)
Fu msg Pt returning your call 

## 2011-07-26 ENCOUNTER — Emergency Department: Payer: Self-pay | Admitting: Emergency Medicine

## 2011-11-07 ENCOUNTER — Other Ambulatory Visit: Payer: Self-pay | Admitting: Gastroenterology

## 2012-03-09 ENCOUNTER — Other Ambulatory Visit: Payer: Self-pay | Admitting: Gastroenterology

## 2012-03-09 ENCOUNTER — Other Ambulatory Visit: Payer: Self-pay | Admitting: Cardiology

## 2012-04-12 ENCOUNTER — Other Ambulatory Visit: Payer: Self-pay | Admitting: Gastroenterology

## 2012-04-18 ENCOUNTER — Other Ambulatory Visit: Payer: Self-pay | Admitting: Gastroenterology

## 2012-04-18 ENCOUNTER — Other Ambulatory Visit: Payer: Self-pay | Admitting: Cardiology

## 2012-04-26 ENCOUNTER — Telehealth: Payer: Self-pay | Admitting: Gastroenterology

## 2012-04-26 MED ORDER — ESOMEPRAZOLE MAGNESIUM 40 MG PO CPDR: DELAYED_RELEASE_CAPSULE | ORAL | Status: DC

## 2012-04-26 NOTE — Telephone Encounter (Signed)
Sent one refill to patient's pharmacy until scheduled appt on 05/08/12.

## 2012-05-08 ENCOUNTER — Telehealth: Payer: Self-pay | Admitting: Cardiology

## 2012-05-08 ENCOUNTER — Ambulatory Visit: Payer: Managed Care, Other (non HMO) | Admitting: Gastroenterology

## 2012-05-08 NOTE — Telephone Encounter (Signed)
Pt had been given tramadol for hand pain by Dr. Cleophas Dunker. Pt filled the prescription for tramadol on Friday.  Pt was told by pharmacist that he should not take lisinopril and tramadol together. Pt stopped lisinopril on Friday. His BP became elevated so he restarted lisinopril today and stopped tramadol. Pt states his BP is in the 150/90 range now. He is requesting appt to follow up on his BP. I have given him an appt with Azucena Kuba 05/10/12.

## 2012-05-08 NOTE — Telephone Encounter (Signed)
New problem     C/O Pinch nerve in right elbow. Scan on Friday.       Elevated blood pressure. On pain medication from orthopedics.        Offer an appt with Herma Carson on  5/5 . Pt refuse stating someone should see him sooner than that.

## 2012-05-08 NOTE — Telephone Encounter (Signed)
Spoke with pt. Pt states for the last 3 months he has pain in his right hand. He thinks he has a pinched nerve and is under treatment for that.

## 2012-05-10 ENCOUNTER — Ambulatory Visit (INDEPENDENT_AMBULATORY_CARE_PROVIDER_SITE_OTHER): Payer: Managed Care, Other (non HMO) | Admitting: Physician Assistant

## 2012-05-10 ENCOUNTER — Encounter: Payer: Self-pay | Admitting: *Deleted

## 2012-05-10 ENCOUNTER — Encounter: Payer: Self-pay | Admitting: Physician Assistant

## 2012-05-10 VITALS — BP 154/84 | HR 74 | Ht 73.0 in | Wt 214.6 lb

## 2012-05-10 DIAGNOSIS — R002 Palpitations: Secondary | ICD-10-CM

## 2012-05-10 DIAGNOSIS — I10 Essential (primary) hypertension: Secondary | ICD-10-CM

## 2012-05-10 DIAGNOSIS — E785 Hyperlipidemia, unspecified: Secondary | ICD-10-CM

## 2012-05-10 NOTE — Patient Instructions (Addendum)
Your physician recommends that you continue on your current medications as directed. Please refer to the Current Medication list given to you today. Stay on lisinipril   Your physician wants you to follow-up in: 4 months with Dr. Shirlee Latch. You will receive a reminder letter in the mail two months in advance. If you don't receive a letter, please call our office to schedule the follow-up appointment.   You will receive a return to work letter today.

## 2012-05-10 NOTE — Progress Notes (Signed)
[address] [place], [place]  [address] Phone: [phone] Fax:  [phone]  Date:  05/10/2012   ID:  Peter [name], DOB 07/30/1955, MRN [id number]  PCP:  Raliegh Ip, MD  Primary Cardiologist:  Dr. Marca Ancona     History of Present Illness: Peter [name] is a 57 y.o. male who returns for evaluation of blood pressure. He has a hx of HTN and PVCs. Holter monitor in 12/11 showed PACs and PVCs. Palpitations seem to be well-suppressed by nebivolol.  Patient called in recently with elevated BP.  Of note, he stopped Lisinopril recently due to potential interaction with tramadol which was given to him for hand pain.  There does not appear to be any interaction between lisinopril and tramadol on my check.  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  He restarted lisinopril yesterday. He has been taking large amounts of ibuprofen. He denies headaches, blurry vision, chest pain, shortness of breath, syncope, orthopnea, PND or edema.  Labs (12/11): K 4.7, creatinine 0.96, TGs 268, LDL 183, HDL 57  Labs (11/12): Cr 0.67   Wt Readings from Last 3 Encounters:  04/04/11 201 lb (91.173 kg)  02/02/11 198 lb (89.812 kg)  01/25/11 202 lb (91.627 kg)    Past Medical History:  1. HTN  2. Echo (12/11): EF 60-65%, mild LVH, no regional WMAs, grade I diastolic dysfunction, mild LAE  3. Anxiety  4. Palpitations: Holter monitor 2004 with occasional PVCs. Holter (12/11) with PACs and PVCs (not common).  5. Nuclear stress test > 10 years ago: normal per patient.  6. Active smoker  7. Hyperlipidemia. Myalgias with Lipitor.  8. GERD with history of esophageal stricture and dilation   Current Outpatient Prescriptions  Medication Sig Dispense Refill  . aspirin 81 MG tablet Take 81 mg by mouth daily.      . busPIRone (BUSPAR) 30 MG tablet Take 45 mg by mouth every morning.        Marland Kitchen BYSTOLIC 10 MG tablet TAKE 1 TABLET (10  MG TOTAL) BY MOUTH DAILY.  30 tablet  5  . esomeprazole (NEXIUM) 40 MG capsule TAKE 1 CAPSULE (40 MG TOTAL) BY MOUTH DAILY.  MUST KEEP APPT FOR ANY FURTHER REFILLS!!  30 capsule  0  . fish oil-omega-3 fatty acids 1000 MG capsule Take 2 g by mouth daily. Take 3  daily      . glucosamine-chondroitin 500-400 MG tablet Take 1 tablet by mouth daily. Take 2 tabs daily      . lisinopril (PRINIVIL,ZESTRIL) 20 MG tablet TAKE 1 TABLET (20 MG TOTAL) BY MOUTH DAILY.  30 tablet  2  . Melatonin 5 MG CAPS Take by mouth at bedtime as needed.      . Multiple Vitamin (MULTIVITAMIN) tablet Take 1 tablet by mouth daily. Centrum Plus -Take 1 daily      . nicotine (NICODERM CQ - DOSED IN MG/24 HOURS) 14 mg/24hr patch Place 1 patch onto the skin daily.      . NON FORMULARY Muscadine seed- Take 2 daily      . rosuvastatin (CRESTOR) 20 MG tablet Take 1 tablet (20 mg total) by mouth at bedtime.  30 tablet  3  . vitamin B-12 (CYANOCOBALAMIN) 250 MCG tablet Take 250 mcg by mouth daily.       No current facility-administered medications for this visit.    Allergies:   No Known Allergies  Social History:  The patient  reports that he has been smoking Cigarettes.  He has a 2.4 pack-year smoking history. He has never used smokeless tobacco. He reports that he drinks about 14.0 ounces of alcohol per week. He reports that he does not use illicit drugs.   ROS:  Please see the history of present illness.     All other systems reviewed and negative.   PHYSICAL EXAM: VS:  BP 154/84  Pulse 74  Ht 6\' 1"  (1.854 m)  Wt 214 lb 9.6 oz (97.342 kg)  BMI 28.32 kg/m2 Well nourished, well developed, in no acute distress HEENT: normal Neck: no JVD Cardiac:  normal S1, S2; RRR; no murmur Lungs:  clear to auscultation bilaterally, no wheezing, rhonchi or rales Abd: soft, nontender, no hepatomegaly Ext: no edema Skin: warm and dry Neuro:  CNs 2-12 intact, no focal abnormalities noted  EKG:  NSR, HR 74, LAD, no acute changes      ASSESSMENT AND PLAN:  1. Hypertension:  I reviewed ACE inhibitors and tramadol.  There is no interaction between ACE inhibitors and tramadol. He should remain on his lisinopril. He may take tramadol as needed. He has been advised to stop taking ibuprofen as much. He may use Tylenol as needed.  He may return to work. 2. Hyperlipidemia: He is currently not on a statin. He prefers to try to bring his cholesterol down by other means. 3. Palpitations: Controlled. 4. Disposition: Follow up with Dr. Shirlee Latch in 4 months.  Peter Glasgow, PA-C  11:06 AM 05/10/2012

## 2012-05-15 ENCOUNTER — Ambulatory Visit: Payer: Managed Care, Other (non HMO) | Admitting: Gastroenterology

## 2012-05-16 ENCOUNTER — Telehealth: Payer: Self-pay | Admitting: Cardiology

## 2012-05-16 NOTE — Telephone Encounter (Signed)
I will forward to Dr McLean for review 

## 2012-05-16 NOTE — Telephone Encounter (Signed)
New problem    Patient calling C/O pinch nerve right elbow. Dx by orthopedic Dr. Cleophas Dunker. Pain is causing his blood pressure to be high. 138/85 this am . Lunch  160/90 . 3 pm today 200/96 .    Taken pain medication tramadol 50 mg  One tab day.    Should blood pressure medication be increase.

## 2012-05-17 NOTE — Telephone Encounter (Signed)
Spoke with patient who c/o BP being elevated (199/100) during the day r/t elbow pain and cannot take Tramadol as it makes him dizzy/loopy and he cannot work while taking it.  When patient gets home from work and takes the Tramadol his BP is 110/75.  I advised patient that we do not want his BP any lower than this. I advised him to call the orthopedist to request a medication that he can take during the day while he works and that I will route message to Dr. Shirlee Latch to see if he has any other advice.

## 2012-05-17 NOTE — Telephone Encounter (Signed)
Needs to get pain under control it sounds like.

## 2012-05-17 NOTE — Telephone Encounter (Signed)
New Prob      Pt is concerned about BP. States it has been elevated the last few days and is needing some advice in regards to what to do. Would like to speak to nurse.

## 2012-05-28 ENCOUNTER — Telehealth: Payer: Self-pay | Admitting: Cardiology

## 2012-05-28 NOTE — Telephone Encounter (Signed)
New problem    Left a CD with Tereso Newcomer on last week .  Want to know has Dr. Shirlee Latch review it yet.

## 2012-05-28 NOTE — Telephone Encounter (Signed)
I have CD for Dr Shirlee Latch to review. Dr Shirlee Latch will plan to review on Wed when he is back in the office. Unable to leave message for patient, mailbox full.

## 2012-05-28 NOTE — Telephone Encounter (Signed)
Spoke with pt

## 2012-05-30 ENCOUNTER — Ambulatory Visit: Payer: Managed Care, Other (non HMO) | Admitting: Gastroenterology

## 2012-05-30 NOTE — Telephone Encounter (Signed)
Dr Shirlee Latch reviewed L-Spine CD done at [facility]. Per Dr Nunzio Cobbs indicate some plaque in aorta. Dr Shirlee Latch recommends continue statin and aspirin. If pt has not had lipid recently he should have that done to be sure cholesterol is under good control.

## 2012-05-30 NOTE — Telephone Encounter (Signed)
Unable to leave message at [phone], mailbox full. Other numbers not working.

## 2012-06-01 NOTE — Telephone Encounter (Signed)
Attempted to contact patient again , unable to leave message.

## 2012-06-05 MED ORDER — ROSUVASTATIN CALCIUM 20 MG PO TABS: 20.0000 mg | ORAL_TABLET | Freq: Every day | ORAL | Status: DC

## 2012-06-05 NOTE — Telephone Encounter (Signed)
Spoke with patient.

## 2012-06-05 NOTE — Telephone Encounter (Signed)
Pt states he has not been taking crestor. He will restart crestor 20mg  daily. I will leave an order for fasting lipid/liver profile at front desk for pt to pick up to take to [facility].

## 2012-06-05 NOTE — Telephone Encounter (Signed)
Pt is aware he should have fasting lipid /liver profile 2 months after restarting crestor.

## 2012-07-25 ENCOUNTER — Other Ambulatory Visit: Payer: Self-pay | Admitting: Cardiology

## 2012-08-28 ENCOUNTER — Telehealth: Payer: Self-pay | Admitting: Neurology

## 2012-08-28 NOTE — Telephone Encounter (Signed)
Dr. Cleophas Dunker called concerning this patient. The patient has had decompression of the right ulnar nerve at the cubital tunnel in may of 2014.. The patient did well initially, but now has had recurrent symptoms. The patient apparently had a repeat EMG nerve conduction study done on 08/17/2012. This study however, involved the left arm and ulnar nerve, not the right side. There was evidence of a severe ulnar neuropathy on this side as well. Dr. Cleophas Dunker wishes to have this patient evaluated prior to a possible transposition of the right ulnar nerve in the near future. We will try to get the patient worked in.

## 2012-09-03 ENCOUNTER — Telehealth: Payer: Self-pay | Admitting: Neurology

## 2012-09-03 ENCOUNTER — Encounter: Payer: Self-pay | Admitting: Neurology

## 2012-09-03 ENCOUNTER — Ambulatory Visit: Payer: Self-pay | Admitting: Neurology

## 2012-09-03 ENCOUNTER — Ambulatory Visit (INDEPENDENT_AMBULATORY_CARE_PROVIDER_SITE_OTHER): Payer: 59 | Admitting: Neurology

## 2012-09-03 ENCOUNTER — Other Ambulatory Visit: Payer: Self-pay | Admitting: Neurology

## 2012-09-03 VITALS — BP 119/79 | HR 93 | Ht 72.25 in | Wt 196.0 lb

## 2012-09-03 DIAGNOSIS — R269 Unspecified abnormalities of gait and mobility: Secondary | ICD-10-CM

## 2012-09-03 DIAGNOSIS — G5620 Lesion of ulnar nerve, unspecified upper limb: Secondary | ICD-10-CM

## 2012-09-03 DIAGNOSIS — G5621 Lesion of ulnar nerve, right upper limb: Secondary | ICD-10-CM

## 2012-09-03 HISTORY — DX: Lesion of ulnar nerve, unspecified upper limb: G56.20

## 2012-09-03 HISTORY — DX: Unspecified abnormalities of gait and mobility: R26.9

## 2012-09-03 NOTE — Progress Notes (Signed)
Reason for visit: Right ulnar neuropathy  JANIS CUFFE is a 57 y.o. male  History of present illness:  Mr. Agner is a 57 year old right-handed white male with a history of alcohol overuse. The patient indicates that he was drinking quite a bit of alcohol until he retired in June of 2014. The patient claims that he began having some numbness in the right hand in February 2014, and in April of 2014, the patient awakened with significant weakness and numbness of the right hand. The patient underwent surgery on the ulnar nerve on 05/22/2012. The patient had neurolysis of the ulnar nerve at the elbow. The patient gained good improvement for about 6 weeks, and then he began noting worsening of symptoms with numbness and weakness of the right hand. The patient underwent another EMG study that revealed worsening function of the ulnar nerve. EMG and nerve conduction study is reported out as being done on the left side, but the patient indicates that the study was done on the right side. The patient has pain at the right elbow. The patient denies any neck pain, or shoulder discomfort, or pain down arms or the patient does have a history of low back pain, but he indicates that this has improved since he retired. The patient apparently has a chronic gait disorder, and the wife indicates that this has significantly worsened over the last 2 or 3 months. The patient will fall on occasion. The gait disorder has been there for one or 2 years. The patient denies numbness in the feet or burning or stinging in the feet. The patient does have some urgency with the bladder, and occasional incontinence. The patient is sent to this office for further evaluation.   Past Medical History  Diagnosis Date  . Hypertension   . Anxiety   . Palpitations   . Hyperlipidemia   . Internal hemorrhoids   . Tubular adenoma of colon 03/2008  . Arthritis   . Murmur, cardiac   . Esophageal stricture   . Lesion of ulnar nerve  09/03/2012    Right ulnar neuropathy  . Abnormality of gait 09/03/2012  . Alcohol abuse     Past Surgical History  Procedure Laterality Date  . Hand surgery      left  . Circumcision    . Colonoscopy    . Elbow surgery    . Shoulder surgery      Family History  Problem Relation Age of Onset  . Diabetes Father   . Kidney disease Father   . Lung cancer Father     smoker  . Lung cancer Mother     smoke    Social history:  reports that he has been smoking Cigarettes.  He has a 24 pack-year smoking history. He has never used smokeless tobacco. He reports that he drinks about 14.0 ounces of alcohol per week. He reports that he does not use illicit drugs.  Medications:  Current Outpatient Prescriptions on File Prior to Visit  Medication Sig Dispense Refill  . b complex vitamins tablet Take 1 tablet by mouth daily.      . busPIRone (BUSPAR) 30 MG tablet Take 45 mg by mouth every morning.        Marland Kitchen BYSTOLIC 10 MG tablet TAKE 1 TABLET (10 MG TOTAL) BY MOUTH DAILY.  30 tablet  2  . lisinopril (PRINIVIL,ZESTRIL) 20 MG tablet TAKE 1 TABLET (20 MG TOTAL) BY MOUTH DAILY.  30 tablet  2  . milk thistle 175  MG tablet Take 175 mg by mouth daily.      . Multiple Vitamin (MULTIVITAMIN) tablet Take 1 tablet by mouth daily. Centrum Plus -Take 1 daily       No current facility-administered medications on file prior to visit.     No Known Allergies  ROS:  Out of a complete 14 system review of symptoms, the patient complains only of the following symptoms, and all other reviewed systems are negative.  Weight loss, fatigue Easy bruising, feeling cold Diarrhea Memory loss, confusion, weakness, slurred speech, dizziness, tremor Anxiety, too much sleep, decreased energy, change in appetite, disinterest in activities, racing thoughts Snoring Change in sensation of taste  Blood pressure 119/79, pulse 93, height 6' 0.25" (1.835 m), weight 196 lb (88.905 kg).  Physical Exam  General: The  patient is alert and cooperative at the time of the examination.  Head: Pupils are equal, round, and reactive to light. Discs are flat bilaterally.  Neck: The neck is supple, no carotid bruits are noted.  Respiratory: The respiratory examination is clear.  Cardiovascular: The cardiovascular examination reveals a regular rate and rhythm, no obvious murmurs or rubs are noted.  Skin: Extremities are without significant edema.  Neurologic Exam  Mental status:  Cranial nerves: Facial symmetry is present. There is good sensation of the face to pinprick and soft touch bilaterally. The strength of the facial muscles and the muscles to head turning and shoulder shrug are normal bilaterally. Speech is well enunciated, no aphasia or dysarthria is noted. Extraocular movements are full. Visual fields are full.  Motor: The motor testing reveals 5 over 5 strength of all 4 extremities, with exception of some weakness of the intrinsic muscles of the right hand, and with distal flexion of the fourth and fifth fingers of the right hand. The patient has atrophy of the first dorsal interosseous muscle on the right.  Good symmetric motor tone is noted throughout.  Sensory: Sensory testing is intact to pinprick, soft touch, vibration sensation, and position sense on all 4 extremities, with exception of some decreased pinprick sensation on the fourth and fifth fingers of the right hand . No evidence of extinction is noted.  Coordination: Cerebellar testing reveals good finger-nose-finger and heel-to-shin bilaterally.  Gait and station: Gait is wide-based, unsteady. Tandem gait is unsteady.  Romberg is negative. No drift is seen.  Reflexes: Deep tendon reflexes are symmetric and normal bilaterally, with exception that the ankle jerk reflexes are depressed bilaterally. Toes are downgoing bilaterally.   Assessment/Plan:  One. Right ulnar neuropathy  2. Gait disorder  3. Alcohol overuse  The patient has had  worsening of his right ulnar neuropathy following the initial surgery in May of 2014. The clinical examination is fully consistent with a tardy ulnar palsy, and given the clinical worsening, a full transposition procedure is indicated. The patient also has problems with his ability to ambulate, with a very wide-based, unsteady gait. The patient may have small vessel disease or an alcohol-related gait disorder. MRI of the brain will be done, and the patient will undergo some blood work today. I have asked the patient to stop drinking to allow for healing of the ulnar nerve following surgery. The patient is on schedule for surgery on 09/06/2012. The patient followup in 6 months.   Marlan Palau MD 09/03/2012 7:13 PM  [facility] [address] [place], [place] [address]  Phone [phone] Fax [phone]

## 2012-09-04 NOTE — Telephone Encounter (Signed)
I called patient. I explained that the RPR is a standard test for individuals with walking problems. The test is cheap, and  if abnormalities are seen, this is a very treatable entity.

## 2012-09-06 LAB — TSH: TSH: 0.959 u[IU]/mL (ref 0.450–4.500)

## 2012-09-12 ENCOUNTER — Ambulatory Visit (INDEPENDENT_AMBULATORY_CARE_PROVIDER_SITE_OTHER): Payer: 59 | Admitting: Cardiology

## 2012-09-12 ENCOUNTER — Encounter: Payer: Self-pay | Admitting: Cardiology

## 2012-09-12 VITALS — BP 130/70 | HR 113 | Ht 72.25 in | Wt 201.0 lb

## 2012-09-12 DIAGNOSIS — I10 Essential (primary) hypertension: Secondary | ICD-10-CM

## 2012-09-12 DIAGNOSIS — R002 Palpitations: Secondary | ICD-10-CM

## 2012-09-12 DIAGNOSIS — F17200 Nicotine dependence, unspecified, uncomplicated: Secondary | ICD-10-CM

## 2012-09-12 DIAGNOSIS — E785 Hyperlipidemia, unspecified: Secondary | ICD-10-CM

## 2012-09-12 MED ORDER — NEBIVOLOL HCL 5 MG PO TABS: 5.0000 mg | ORAL_TABLET | Freq: Every day | ORAL | Status: DC

## 2012-09-12 NOTE — Progress Notes (Signed)
Patient ID: Peter [name], male   DOB: 1955-04-09, 57 y.o.   MRN: [id number] PCP: Loma Sender  57 yo with history of HTN, hyperlipidemia, and PVCs presents for cardiology followup.   He had been having trouble regulating his blood pressure but has recently retired and has lost about 10 lbs.  His blood pressure most recently has been well-controlled and was getting low (SBP in 90s).  Therefore, he stopped both nebivolol and lisinopril.  BP is 130/70 today.  HR is elevated today with sinus tachycardia (has been off nebivolol for several days).  No chest pain, no exertional dyspnea.  He was on Crestor 20 mg daily but developed muscle weakness so stopped it.  His PCP has wanted him to try Crestor 5 mg daily (he has the prescription and just wanted to ask me about it).  Main problems recently has been right ulnar neuropathy.  He had ulnar nerve transposition in 8/14. He still smokes.   Labs (12/11): K 4.7, creatinine 0.96, TGs 268, LDL 183, HDL 57  Labs (11/12): creatinine 0.67  ECG: sinus tachycardia, inferior Qs of ?significance.    Allergies (verified):  No Known Drug Allergies   Past Medical History:  1. HTN  2. Echo (12/11): EF 60-65%, mild LVH, no regional WMAs, grade I diastolic dysfunction, mild LAE  3. Anxiety  4. Palpitations: Holter monitor 2004 with occasional PVCs. Holter (12/11) with PACs and PVCs (not common).  5. Nuclear stress test > 10 years ago: normal per patient.  6. Active smoker 7. Hyperlipidemia. Myalgias with Lipitor.  Weakness with Crestor 20.  8. GERD with history of esophageal stricture and dilation.  9. Ulnar neuropathy  Family History:  No premature CAD   Social History:  Smokes 10 cigs/day +ETOH Lives in [place], married  Retired Teaching laboratory technician for [employer]  Review of Systems  All systems reviewed and negative except as per HPI.   Current Outpatient Prescriptions  Medication Sig Dispense Refill  . b complex vitamins tablet  Take 1 tablet by mouth daily.      . busPIRone (BUSPAR) 30 MG tablet Take 45 mg by mouth every morning.        . chlordiazePOXIDE (LIBRIUM) 5 MG capsule Take 5 mg by mouth 3 (three) times daily as needed.      . milk thistle 175 MG tablet Take 175 mg by mouth daily.      . Multiple Vitamin (MULTIVITAMIN) tablet Take 1 tablet by mouth daily. Centrum Plus -Take 1 daily      . omeprazole (PRILOSEC) 20 MG capsule Take 20 mg by mouth daily.      . rosuvastatin (CRESTOR) 5 MG tablet Take 5 mg by mouth daily.      . traMADol (ULTRAM) 50 MG tablet Take 50 mg by mouth every 6 (six) hours as needed for pain.      Marland Kitchen aspirin EC 81 MG tablet Take 1 tablet (81 mg total) by mouth daily.      . nebivolol (BYSTOLIC) 5 MG tablet Take 1 tablet (5 mg total) by mouth daily.  30 tablet  11   No current facility-administered medications for this visit.    BP 130/70  Pulse 113  Ht 6' 0.25" (1.835 m)  Wt 91.173 kg (201 lb)  BMI 27.08 kg/m2  SpO2 98% General: NAD Neck: No JVD, no thyromegaly or thyroid nodule.  Lungs: Clear to auscultation bilaterally with normal respiratory effort. CV: Nondisplaced PMI.  Heart mildly tachycardic, regular S1/S2, soft S4,  no murmur.  No peripheral edema.  No carotid bruit.  Normal pedal pulses.  Abdomen: Soft, nontender, no hepatosplenomegaly, no distention.  Neurologic: Alert and oriented x 3.  Psych: Normal affect. Extremities: No clubbing or cyanosis.   Assessment/Plan: 1. HTN: BP is under much better control since he retired (decreased stress, weight loss). He has not been taking as much Ibuprofen.  He has not been drinking as much ETOH.  Looking at recent blood pressures, I think that he can stay off lisinopril.  I will have him restart nebivolol at 5 mg daily given sinus tachycardia (suspect due to beta blocker withdrawal).   2. Hyperlipidemia: LDL has been very high in the past.  I will have him try Crestor 5 mg daily (weakness with Crestor 20, myalgias with Lipitor).   Lipids/LFTs in 2 months.   3. He should start ASA 81 mg daily.  4. Smoking: I strongly encouraged him to stop smoking.   Marca Ancona 09/13/2012

## 2012-09-12 NOTE — Patient Instructions (Addendum)
Start bystolic 5mg  daily.   Start aspirin 81mg  daily.  Take crestor 5mg  daily.   Your physician recommends that you return for a FASTING lipid profile /liver profile in 2 months.    Your physician wants you to follow-up in: 1 year with Dr Shirlee Latch. (September 2015).  You will receive a reminder letter in the mail two months in advance. If you don't receive a letter, please call our office to schedule the follow-up appointment.

## 2012-09-13 NOTE — Progress Notes (Signed)
Quick Note:  Relayed results of labs to wife. B12 elevated (pt on supplementation), otherwise unremarkable. ______

## 2012-09-26 ENCOUNTER — Other Ambulatory Visit: Payer: Self-pay | Admitting: *Deleted

## 2012-09-26 DIAGNOSIS — R0989 Other specified symptoms and signs involving the circulatory and respiratory systems: Secondary | ICD-10-CM

## 2012-09-26 DIAGNOSIS — M7989 Other specified soft tissue disorders: Secondary | ICD-10-CM

## 2012-10-01 ENCOUNTER — Telehealth: Payer: Self-pay | Admitting: Vascular Surgery

## 2012-10-01 NOTE — Telephone Encounter (Addendum)
Message copied by Fredrich Birks on Mon Oct 01, 2012  3:52 PM ------      Message from: Phillips Odor      Created: Mon Oct 01, 2012  3:05 PM      Regarding: cancellation or any available new pt. appt.      Contact: [phone]       This pt. has requested if any openings become avail., to be worked in  earlier than 10/3.  I explained that with the vascular studies attached to the appt., it may not be possible any earlier than 10/3.  Can we put him on a cancellation list?    ------  Patient is now on the wait list- dpm

## 2012-10-03 DIAGNOSIS — R269 Unspecified abnormalities of gait and mobility: Secondary | ICD-10-CM

## 2012-10-05 ENCOUNTER — Telehealth: Payer: Self-pay | Admitting: Neurology

## 2012-10-05 ENCOUNTER — Other Ambulatory Visit: Payer: Self-pay | Admitting: Diagnostic Neuroimaging

## 2012-10-05 DIAGNOSIS — R269 Unspecified abnormalities of gait and mobility: Secondary | ICD-10-CM

## 2012-10-05 DIAGNOSIS — G5621 Lesion of ulnar nerve, right upper limb: Secondary | ICD-10-CM

## 2012-10-05 NOTE — Telephone Encounter (Signed)
I called patient. The MRI the brain shows minimal small vessel changes. The patient has a history of dyslipidemia and hypertension, chronic alcohol use. The patient indicates that he went off of Crestor, and started eating better, and his walking has improved. The patient wishes to cancel his next revisit. The patient has had his ulnar nerve transposition, and he is healing up from this.

## 2012-10-11 ENCOUNTER — Encounter: Payer: Self-pay | Admitting: Vascular Surgery

## 2012-10-12 ENCOUNTER — Ambulatory Visit (HOSPITAL_COMMUNITY)
Admission: RE | Admit: 2012-10-12 | Discharge: 2012-10-12 | Disposition: A | Discharge status: Home / Self Care | Payer: 59 | Source: Ambulatory Visit | Attending: Vascular Surgery | Admitting: Vascular Surgery

## 2012-10-12 ENCOUNTER — Ambulatory Visit (INDEPENDENT_AMBULATORY_CARE_PROVIDER_SITE_OTHER)
Admission: RE | Admit: 2012-10-12 | Discharge: 2012-10-12 | Disposition: A | Discharge status: Home / Self Care | Payer: 59 | Source: Ambulatory Visit | Attending: Vascular Surgery | Admitting: Vascular Surgery

## 2012-10-12 ENCOUNTER — Ambulatory Visit (INDEPENDENT_AMBULATORY_CARE_PROVIDER_SITE_OTHER): Payer: 59 | Admitting: Vascular Surgery

## 2012-10-12 ENCOUNTER — Encounter: Payer: Self-pay | Admitting: Vascular Surgery

## 2012-10-12 VITALS — BP 152/91 | HR 87 | Ht 72.0 in | Wt 207.0 lb

## 2012-10-12 DIAGNOSIS — R0989 Other specified symptoms and signs involving the circulatory and respiratory systems: Secondary | ICD-10-CM | POA: Insufficient documentation

## 2012-10-12 DIAGNOSIS — M7989 Other specified soft tissue disorders: Secondary | ICD-10-CM

## 2012-10-12 DIAGNOSIS — I872 Venous insufficiency (chronic) (peripheral): Secondary | ICD-10-CM

## 2012-10-12 DIAGNOSIS — I739 Peripheral vascular disease, unspecified: Secondary | ICD-10-CM

## 2012-10-12 NOTE — Progress Notes (Signed)
[facility]  Referred by:  Valeria Batman, MD [address] [place], [place] [address]  Reason for referral: Blue and swollen feet  History of Present Illness  Peter [name] is a 57 y.o. (05-03-1955) male who presents with chief complaint: blue and swollen feet.  Patient notes, onset of swelling a few week ago,  associated with crestor initiation.  The patient's symptoms include: intermittent aching in feet with swelling lower legs and some cyanotic hue.  The patient has had no history of DVT, no history of varicose vein, no history of venous stasis ulcers, no history of  Lymphedema and no history of skin changes in lower legs.  There is no family history of venous disorders.  The patient has never used compression stockings in the past.  The patient denies any intermittent claudication or rest pain.   Past Medical History  Diagnosis Date  . Hypertension   . Anxiety   . Palpitations   . Hyperlipidemia   . Internal hemorrhoids   . Tubular adenoma of colon 03/2008  . Arthritis   . Murmur, cardiac   . Esophageal stricture   . Lesion of ulnar nerve 09/03/2012    Right ulnar neuropathy  . Abnormality of gait 09/03/2012  . Alcohol abuse     Past Surgical History  Procedure Laterality Date  . Hand surgery      left  . Circumcision    . Colonoscopy    . Elbow surgery    . Shoulder surgery      History   Social History  . Marital Status: Married    Spouse Name: N/A    Number of Children: 0  . Years of Education: N/A   Occupational History  . Maintenance supervisor  [employer]  . 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    Social History Main Topics  . Smoking status: Current Every Day Smoker -- 0.50 packs/day for 24 years    Types: Cigarettes  . Smokeless tobacco: Never Used  . Alcohol Use: 13.2 - 15.6 oz/week    10-14 Glasses of wine, 12 Cans of beer per week     Comment: 4 per day, history of alcohol abuse  . Drug Use: Yes    Special: Marijuana   Comment: 1970's occ.   Marland Kitchen Sexual Activity: Not on file   Other Topics Concern  . Not on file   Social History Narrative  . No narrative on file    Family History  Problem Relation Age of Onset  . Diabetes Father   . Kidney disease Father   . Lung cancer Father     smoker  . Cancer Father   . Hyperlipidemia Father   . Hypertension Father   . Lung cancer Mother     smoke  . Cancer Mother   . Hypertension Mother     Current Outpatient Prescriptions on File Prior to Visit  Medication Sig Dispense Refill  . aspirin EC 81 MG tablet Take 1 tablet (81 mg total) by mouth daily.      Marland Kitchen b complex vitamins tablet Take 1 tablet by mouth daily.      . busPIRone (BUSPAR) 30 MG tablet Take 45 mg by mouth every morning.        . chlordiazePOXIDE (LIBRIUM) 5 MG capsule Take 5 mg by mouth 3 (three) times daily as needed.      . milk thistle 175 MG tablet Take 175 mg by mouth daily.      Marland Kitchen  Multiple Vitamin (MULTIVITAMIN) tablet Take 1 tablet by mouth daily. Centrum Plus -Take 1 daily      . nebivolol (BYSTOLIC) 5 MG tablet Take 1 tablet (5 mg total) by mouth daily.  30 tablet  11  . omeprazole (PRILOSEC) 20 MG capsule Take 20 mg by mouth daily.      . traMADol (ULTRAM) 50 MG tablet Take 50 mg by mouth every 6 (six) hours as needed for pain.       No current facility-administered medications on file prior to visit.    No Known Allergies   REVIEW OF SYSTEMS:  (Positives checked otherwise negative)  CARDIOVASCULAR:  []  chest pain, []  chest pressure, []  palpitations, []  shortness of breath when laying flat, []  shortness of breath with exertion,  [x]  pain in feet when walking, [x]  pain in feet when laying flat, []  history of blood clot in veins (DVT), []  history of phlebitis, [x]  swelling in legs, []  varicose veins  PULMONARY:  []  productive cough, []  asthma, []  wheezing  NEUROLOGIC:  []  weakness in arms or legs, []  numbness in arms or legs, []  difficulty speaking or slurred speech, []   temporary loss of vision in one eye, [x]  dizziness  HEMATOLOGIC:  []  bleeding problems, []  problems with blood clotting too easily  MUSCULOSKEL:  []  joint pain, []  joint swelling  GASTROINTEST:  []  vomiting blood, []  blood in stool     GENITOURINARY:  []  burning with urination, []  blood in urine  PSYCHIATRIC:  []  history of major depression  INTEGUMENTARY:  []  rashes, []  ulcers  CONSTITUTIONAL:  []  fever, []  chills   Physical Examination Filed Vitals:   10/12/12 1542  BP: 152/91  Pulse: 87  Height: 6' (1.829 m)  Weight: 207 lb (93.895 kg)  SpO2: 100%   Body mass index is 28.07 kg/(m^2).  General: A&O x 3, WDWN  Head: [place]/AT  Ear/Nose/Throat: Hearing grossly intact, nares w/o erythema or drainage, oropharynx w/o Erythema/Exudate  Eyes: PERRLA, EOMI  Neck: Supple, no nuchal rigidity, no palpable LAD  Pulmonary: Sym exp, good air movt, CTAB, no rales, rhonchi, & wheezing  Cardiac: RRR, Nl S1, S2, no Murmurs, rubs or gallops  Vascular: Vessel Right Left  Radial Palpable Palpable  Brachial Palpable Palpable  Carotid Palpable, without bruit Palpable, without bruit  Aorta Not palpable N/A  Femoral Palpable Palpable  Popliteal Not palpable Not palpable  PT Palpable Palpable  DP Palpable Palpable   Gastrointestinal: soft, NTND, -G/R, - HSM, - masses, - CVAT B  Musculoskeletal: M/S 5/5 throughout , Extremities without ischemic changes , cyanotic hue B feet, L>R edema 1-2+, L lateral burn  Neurologic: CN grossly intact, Pain and light touch intact in extremities , Motor exam as listed above  Psychiatric: Judgment intact, Mood & affect appropriate for pt's clinical situation  Dermatologic: See M/S exam for extremity exam, no rashes otherwise noted  Lymph : No Cervical, Axillary, or Inguinal lymphadenopathy   Non-Invasive Vascular Imaging  LLE Venous Insufficiency Duplex (Date: 10/12/2012):   LLE: - DVT and SVT, - GSV reflux, mild femoral venous reflux  ABI  (Date: 10/12/2012)  R: 1.12 , DP: tri, PT: tri, TBI: 0.89  L: 1.16 , DP: tri, PT: tri, TBI: 0.68    Outside Studies/Documentation 5 pages of outside documents were reviewed including: outside ortho records.  Medical Decision Making  Peter [name] is a 57 y.o. male who presents with: BLE chronic venous insufficiency (C2), tobacco dependence, no evidence of PAD   Based on the  patient's history and examination, I recommend: OTC compression stockings.    I suspect some of the cyanotic hue and swelling is his CVI but I doubt it accounts for all his swelling.  His ABI demonstrate normal arterial blood flow so there is no evidence ischemia as the etiology for his findings.  I would consider other elements of the DDx including systemic etiology such as CHF or drug reaction.  Thank you for allowing [place] to participate in this patient's care.  Leonides Sake, MD [facility] Office: [phone] Pager: [phone]  10/12/2012, 4:45 PM

## 2012-10-15 ENCOUNTER — Other Ambulatory Visit: Payer: Self-pay | Admitting: Vascular Surgery

## 2012-10-16 ENCOUNTER — Encounter: Payer: Self-pay | Admitting: Neurology

## 2012-10-31 ENCOUNTER — Ambulatory Visit: Payer: 59 | Admitting: Neurology

## 2012-11-01 ENCOUNTER — Ambulatory Visit: Payer: 59 | Admitting: Gastroenterology

## 2012-11-23 ENCOUNTER — Emergency Department: Payer: Self-pay | Admitting: Emergency Medicine

## 2012-11-23 LAB — COMPREHENSIVE METABOLIC PANEL
Albumin: 2.8 g/dL — ABNORMAL LOW (ref 3.4–5.0)
Anion Gap: 6 — ABNORMAL LOW (ref 7–16)
Calcium, Total: 8.4 mg/dL — ABNORMAL LOW (ref 8.5–10.1)
Chloride: 107 mmol/L (ref 98–107)
Co2: 26 mmol/L (ref 21–32)
EGFR (African American): >60
Osmolality: 280 (ref 275–301)
SGOT(AST): 111 U/L — ABNORMAL HIGH (ref 15–37)

## 2012-11-23 LAB — PROTIME-INR
INR: 1.1
Prothrombin Time: 14.7 secs (ref 11.5–14.7)

## 2012-11-23 LAB — APTT: Activated PTT: 40.4 secs — ABNORMAL HIGH (ref 23.6–35.9)

## 2012-11-23 LAB — CK: CK, Total: 139 U/L (ref 35–232)

## 2012-11-23 LAB — CBC
HCT: 40.1 % (ref 40.0–52.0)
MCHC: 35.5 g/dL (ref 32.0–36.0)
MCV: 112 fL — ABNORMAL HIGH (ref 80–100)
RBC: 3.58 10*6/uL — ABNORMAL LOW (ref 4.40–5.90)
WBC: 9.2 10*3/uL (ref 3.8–10.6)

## 2012-12-03 ENCOUNTER — Ambulatory Visit: Payer: Self-pay | Admitting: Gastroenterology

## 2012-12-10 ENCOUNTER — Encounter: Payer: Self-pay | Admitting: Gastroenterology

## 2012-12-10 ENCOUNTER — Ambulatory Visit (INDEPENDENT_AMBULATORY_CARE_PROVIDER_SITE_OTHER): Payer: 59 | Admitting: Gastroenterology

## 2012-12-10 VITALS — BP 140/80 | HR 92 | Ht 72.0 in | Wt 219.0 lb

## 2012-12-10 DIAGNOSIS — Z23 Encounter for immunization: Secondary | ICD-10-CM

## 2012-12-10 DIAGNOSIS — R188 Other ascites: Secondary | ICD-10-CM

## 2012-12-10 MED ORDER — ESOMEPRAZOLE MAGNESIUM 40 MG PO CPDR: 40.0000 mg | DELAYED_RELEASE_CAPSULE | Freq: Every day | ORAL | Status: DC

## 2012-12-10 NOTE — Progress Notes (Signed)
    History of Present Illness: This is a 57 year old male that I have evaluated in past for GERD with an esophageal stricture and for adenomatous colon polyps. He was evaluated at [facility] emergency department for abdominal distention and possible liver disease. He was then evaluated at [facility] gastroenterology on 11/30/2012. He now is transferring his care here. I have reviewed extensive records from [facility] and [facility]. Patient related worsening abdominal distention and lower extremities edema over several weeks. He has a long history of heavy alcohol use and abuse. He states he discontinued all alcohol use after being informed of possible liver disease at [facility] emergency department. Blood work was remarkable for a hemoglobin of 12 and an MCV of 118. AST of 95, alkaline phosphatase of 119,  albumin 3.2, serum calcium 8.5. Hepatitis A, B, and C serologies were negative. Iron elevated at 190. Alpha-fetoprotein, ceruloplasmin, alpha-1 antitrypsin were apparently sent but I do not have the records Abdominal ultrasound showed an echogenic liver with coarse echotexture and a small amount of perihepatic ascites attempt at ultrasound-guided paracentesis is apparently not successful. He was placed on Lasix but discontinued it after one week as he states it made him feel poorly.  Current Medications, Allergies, Past Medical History, Past Surgical History, Family History and Social History were reviewed in [employer] record.  Physical Exam: General: Well developed , well nourished, no acute distress Head: Normocephalic and atraumatic Eyes:  sclerae anicteric, EOMI Ears: Normal auditory acuity Mouth: No deformity or lesions Lungs: Clear throughout to auscultation Heart: Regular rate and rhythm; no murmurs, rubs or bruits Abdomen: Soft, non tender and mildly protuberant and mildly distended. No fluid wave or shifting dullness. No masses, hepatosplenomegaly or hernias noted. Normal  Bowel sounds Musculoskeletal: Symmetrical with no gross deformities  Pulses:  Normal pulses noted Extremities: No clubbing, cyanosis, or deformities noted. 2+ pedal and pretibial edema Neurological: Alert oriented x 4, grossly nonfocal Psychological:  Alert and cooperative. Normal mood and affect  Assessment and Recommendations:  1. Presumed cirrhosis, alcohol related. Possible alcoholic hepatitis. Presumed ascites. Peripheral edema. Obtain blood work previously ordered by the [facility] and add an ANA, anti-smooth muscle antibody and AMA. Advised complete abstinence from alcohol long-term. Advised no more than 2 g of acetaminophen daily. Advised a low-sodium diet long-term. Schedule abdominal/pelvic CT scan to further evaluate cirrhosis, for possible liver lesions and for ascites. If significant ascites is noted on CT scan schedule paracentesis. If there is only minimal ascites will resume a diuretic regimen with furosemide and spironolactone. Monitor and record daily weights. Hepatitis A and B vaccinations. Schedule upper endoscopy to screen for varices. The risks, benefits, and alternatives to endoscopy with possible biopsy and possible dilation were discussed with the patient and they consent to proceed.   2. Alcoholism. Long-term alcohol abstinence is recommended. Support for for maintenance of abstinence is strongly recommended.  3. GERD with a history of peptic stricture. Continue Nexium 40 mg daily and standard antireflux measures.   4. Personal history of adenomatous colon polyps. Surveillance colonoscopy recommended March 2015.

## 2012-12-10 NOTE — Patient Instructions (Addendum)
You have been scheduled for an endoscopy with propofol. Please follow written instructions given to you at your visit today. If you use inhalers (even only as needed), please bring them with you on the day of your procedure.  You have been scheduled for a CT scan of the abdomen and pelvis on 12/11/12 at 8:45am at [address] [facility] outpatient radiology center). Please arrive 15 minutes early for registration. Please go by and pick up there contrast kit today for the CT scan. Please have nothing to eat or drink 4 hours prior to this test. Please bring a list of your medications.   Please come back on 12/17/12 at 10:30am for your next Twinrix injection to our building on the third floor.   We have sent the following medications to your pharmacy for you to pick up at your convenience: Nexium.  Thank you for choosing me and [facility].  Venita Lick. Pleas Koch., MD., [name]

## 2012-12-11 ENCOUNTER — Encounter: Payer: Self-pay | Admitting: Gastroenterology

## 2012-12-11 ENCOUNTER — Other Ambulatory Visit: Payer: Self-pay | Admitting: Gastroenterology

## 2012-12-11 ENCOUNTER — Ambulatory Visit: Payer: Self-pay | Admitting: Gastroenterology

## 2012-12-11 DIAGNOSIS — R945 Abnormal results of liver function studies: Secondary | ICD-10-CM

## 2012-12-12 ENCOUNTER — Telehealth: Payer: Self-pay | Admitting: Gastroenterology

## 2012-12-12 NOTE — Telephone Encounter (Signed)
Patient states he lives right beside a [facility] and would like me to fax the requisition order for the labs to [place] at [phone]. Faxed the labs to [facility].

## 2012-12-12 NOTE — Telephone Encounter (Signed)
Returned patient call with no answer and unable to leave a message because his mailbox is full.

## 2012-12-17 ENCOUNTER — Telehealth: Payer: Self-pay | Admitting: Gastroenterology

## 2012-12-17 ENCOUNTER — Ambulatory Visit (INDEPENDENT_AMBULATORY_CARE_PROVIDER_SITE_OTHER): Payer: 59 | Admitting: Gastroenterology

## 2012-12-17 DIAGNOSIS — K7460 Unspecified cirrhosis of liver: Secondary | ICD-10-CM

## 2012-12-17 DIAGNOSIS — Z23 Encounter for immunization: Secondary | ICD-10-CM

## 2012-12-17 NOTE — Telephone Encounter (Signed)
Patient advised unable to schedule a double for 12/25/12.  He is scheduled for colon for 01/31/13 and pre-visit 01/21/13

## 2012-12-17 NOTE — Telephone Encounter (Signed)
Left message for patient to call back  

## 2012-12-18 ENCOUNTER — Telehealth: Payer: Self-pay

## 2012-12-18 DIAGNOSIS — R1010 Upper abdominal pain, unspecified: Secondary | ICD-10-CM

## 2012-12-18 DIAGNOSIS — R932 Abnormal findings on diagnostic imaging of liver and biliary tract: Secondary | ICD-10-CM

## 2012-12-18 NOTE — Telephone Encounter (Signed)
Patient advised of abnormal CT at [facility] .  He is scheduled for MRI at [facility] for 12/24/12 arrive at 2:45 for a 3:15 appt.  He is aware to be NPO after midnight

## 2012-12-22 ENCOUNTER — Emergency Department: Payer: Self-pay | Admitting: Internal Medicine

## 2012-12-22 LAB — CBC
HCT: 41.6 % (ref 40.0–52.0)
Platelet: 167 10*3/uL (ref 150–440)
RBC: 3.84 10*6/uL — ABNORMAL LOW (ref 4.40–5.90)
WBC: 11.9 10*3/uL — ABNORMAL HIGH (ref 3.8–10.6)

## 2012-12-22 LAB — DRUG SCREEN, URINE
Amphetamines, Ur Screen: NEGATIVE (ref ?–1000)
Barbiturates, Ur Screen: NEGATIVE (ref ?–200)
Benzodiazepine, Ur Scrn: POSITIVE (ref ?–200)
Cannabinoid 50 Ng, Ur ~~LOC~~: NEGATIVE (ref ?–50)
Cocaine Metabolite,Ur ~~LOC~~: NEGATIVE (ref ?–300)
MDMA (Ecstasy)Ur Screen: NEGATIVE (ref ?–500)
Methadone, Ur Screen: NEGATIVE (ref ?–300)
Tricyclic, Ur Screen: NEGATIVE (ref ?–1000)

## 2012-12-22 LAB — COMPREHENSIVE METABOLIC PANEL
Albumin: 3.2 g/dL — ABNORMAL LOW (ref 3.4–5.0)
Anion Gap: 8 (ref 7–16)
BUN: 9 mg/dL (ref 7–18)
Bilirubin,Total: 0.7 mg/dL (ref 0.2–1.0)
Calcium, Total: 9.1 mg/dL (ref 8.5–10.1)
Chloride: 103 mmol/L (ref 98–107)
Co2: 24 mmol/L (ref 21–32)
Creatinine: 0.47 mg/dL — ABNORMAL LOW (ref 0.60–1.30)
EGFR (African American): >60
EGFR (Non-African Amer.): >60
Glucose: 83 mg/dL (ref 65–99)
Osmolality: 268 (ref 275–301)
Potassium: 4.5 mmol/L (ref 3.5–5.1)
SGOT(AST): 57 U/L — ABNORMAL HIGH (ref 15–37)
SGPT (ALT): 28 U/L (ref 12–78)
Sodium: 135 mmol/L — ABNORMAL LOW (ref 136–145)
Total Protein: 7.3 g/dL (ref 6.4–8.2)

## 2012-12-22 LAB — URINALYSIS, COMPLETE
Bacteria: NONE SEEN
Bilirubin,UR: NEGATIVE
Glucose,UR: NEGATIVE mg/dL (ref 0–75)
Ketone: NEGATIVE
Leukocyte Esterase: NEGATIVE
Nitrite: NEGATIVE
Ph: 6 (ref 4.5–8.0)
Specific Gravity: 1.004 (ref 1.003–1.030)
Squamous Epithelial: NONE SEEN

## 2012-12-22 LAB — CK TOTAL AND CKMB (NOT AT ARMC)
CK, Total: 145 U/L (ref 35–232)
CK-MB: 1.2 ng/mL (ref 0.5–3.6)

## 2012-12-22 LAB — TROPONIN I: Troponin-I: <0.02 ng/mL

## 2012-12-25 ENCOUNTER — Telehealth: Payer: Self-pay | Admitting: *Deleted

## 2012-12-25 ENCOUNTER — Telehealth: Payer: Self-pay | Admitting: Gastroenterology

## 2012-12-25 ENCOUNTER — Ambulatory Visit (AMBULATORY_SURGERY_CENTER): Payer: 59 | Admitting: Gastroenterology

## 2012-12-25 ENCOUNTER — Encounter: Payer: Self-pay | Admitting: Gastroenterology

## 2012-12-25 VITALS — BP 170/82 | HR 65 | Temp 97.6°F | Resp 29 | Ht 73.0 in | Wt 225.0 lb

## 2012-12-25 DIAGNOSIS — K319 Disease of stomach and duodenum, unspecified: Secondary | ICD-10-CM

## 2012-12-25 DIAGNOSIS — K7460 Unspecified cirrhosis of liver: Secondary | ICD-10-CM

## 2012-12-25 DIAGNOSIS — K3189 Other diseases of stomach and duodenum: Secondary | ICD-10-CM

## 2012-12-25 LAB — GLUCOSE, CAPILLARY: Glucose-Capillary: 116 mg/dL — ABNORMAL HIGH (ref 70–99)

## 2012-12-25 MED ORDER — SODIUM CHLORIDE 0.9 % IV SOLN: 500.0000 mL | INTRAVENOUS | Status: DC

## 2012-12-25 NOTE — Telephone Encounter (Signed)
[facility] called back,  They will reschedule the patient for MRI abd/MRCP

## 2012-12-25 NOTE — Op Note (Signed)
[facility] [address] [place], [address]   ENDOSCOPY PROCEDURE REPORT  PATIENT: Peter [name], [place]  MR#: [id number] BIRTHDATE: 04/15/1955 , 57  yrs. old GENDER: Male ENDOSCOPIST: Meryl Dare, MD, [facility] REFERRED BY:  Loma Sender, M.D. PROCEDURE DATE:  12/25/2012 PROCEDURE:  EGD w/ biopsy ASA CLASS:     Class III INDICATIONS:  Screening for varices.   cirrhosis. MEDICATIONS: MAC sedation, administered by CRNA and propofol (Diprivan) 300mg  IV TOPICAL ANESTHETIC: Cetacaine Spray DESCRIPTION OF PROCEDURE: After the risks benefits and alternatives of the procedure were thoroughly explained, informed consent was obtained.  The LB [id number] endoscope was introduced through the mouth and advanced to the second portion of the duodenum. Without limitations.  The instrument was slowly withdrawn as the mucosa was fully examined.  ESOPHAGUS: The mucosa of the esophagus appeared normal.   No esophageal varices noted. STOMACH: Mild portal hypertensive gastropathy was found in the gastric body and in the gastric fundus. Biopsies taken from the body and fundus. The stomach otherwise appeared normal. DUODENUM: The duodenal mucosa showed no abnormalities in the bulb and second portion of the duodenum.  Retroflexed views revealed no abnormalities. No gastric varices noted.  The scope was then withdrawn from the patient and the procedure completed.  COMPLICATIONS: There were no complications.  ENDOSCOPIC IMPRESSION: 1.   Esophagus appeared normal 2.   Portal hypertensive gastropathy  RECOMMENDATIONS: 1.  Continue PPI daily and antireflux measures 2.  Await pathology results 3.  EGD in 3 years.    eSigned:  Meryl Dare, MD, [facility] 12/25/2012 2:24 PM

## 2012-12-25 NOTE — Progress Notes (Signed)
Called to room to assist during endoscopic procedure.  Patient ID and intended procedure confirmed with present staff. Received instructions for my participation in the procedure from the performing physician.  

## 2012-12-25 NOTE — Progress Notes (Signed)
Patient did not have preoperative order for IV antibiotic SSI prophylaxis. (G8918)  Patient did not experience any of the following events: a burn prior to discharge; a fall within the facility; wrong site/side/patient/procedure/implant event; or a hospital transfer or hospital admission upon discharge from the facility. (G8907)  

## 2012-12-25 NOTE — Telephone Encounter (Signed)
I spoke with [facility] they are confused about what the patient needs to his MRI.  He needs MRI abd/MRCP.  They are going to call me back

## 2012-12-25 NOTE — Patient Instructions (Signed)

## 2012-12-25 NOTE — Telephone Encounter (Signed)
Pt called and asked if ok to take his Bystolic and Buspar today. I told him it was fine and to drink clear liquids until 3 hours before the procedure  Understanding voiced

## 2012-12-26 ENCOUNTER — Telehealth: Payer: Self-pay | Admitting: *Deleted

## 2012-12-26 ENCOUNTER — Telehealth: Payer: Self-pay | Admitting: Gastroenterology

## 2012-12-26 ENCOUNTER — Telehealth: Payer: Self-pay

## 2012-12-26 NOTE — Telephone Encounter (Signed)
CT from 12/11/12 at [facility] reviewed.  Minimal ascites not amenable to paracentesis.

## 2012-12-26 NOTE — Telephone Encounter (Signed)
Message left

## 2012-12-26 NOTE — Telephone Encounter (Signed)
CT scan has been placed in your chair.  Please review and advise

## 2012-12-26 NOTE — Telephone Encounter (Signed)
Patient wants to have a paracentesis.  He reports pressure in his abdomen. "I really need to get this done".  Per Dr. Russella Dar he requests another copy of the CT scan performed at [facility] to evaluate ascites.  I have requested a copy from [facility] medical records (copy has not been scanned in the chart yet).

## 2012-12-26 NOTE — Telephone Encounter (Signed)
Pt. Called back and message left for pt.

## 2012-12-26 NOTE — Telephone Encounter (Signed)
Discussed with Dr. Russella Dar.  CT scan reviewed again patient with minimal ascites.  He does not need a paracentesis.  He is advised to keep the follow up MRCP for 01/04/13 to evaluate cyst at the pancreatic head.

## 2012-12-26 NOTE — Telephone Encounter (Signed)
Patient states his was seen recently by his PCP and he was told that he needed a paracentesis for his ascites. Patient would like to discuss with nurse.

## 2012-12-26 NOTE — Telephone Encounter (Signed)
Received call from pt. He stated we were just great in the lec and we took great care of him but he had questions for [facility] on the 3rd floor about scheduling fluid removal from his abdomen. Marchelle Folks notified about pt's concerns and then transferred to [facility] to speak with pt.

## 2013-01-01 ENCOUNTER — Encounter: Payer: Self-pay | Admitting: Gastroenterology

## 2013-01-04 ENCOUNTER — Encounter: Payer: Self-pay | Admitting: Gastroenterology

## 2013-01-04 ENCOUNTER — Ambulatory Visit: Payer: Self-pay | Admitting: Gastroenterology

## 2013-01-07 ENCOUNTER — Telehealth: Payer: Self-pay | Admitting: Gastroenterology

## 2013-01-07 NOTE — Telephone Encounter (Signed)
Patient advised that results are not reviewed and that we will call with the results as soon as they are

## 2013-01-08 ENCOUNTER — Telehealth: Payer: Self-pay | Admitting: Gastroenterology

## 2013-01-08 NOTE — Telephone Encounter (Signed)
Patient advised again that Dr. Russella Dar is out of the office this week and that we will call with the results once he has reviewed them

## 2013-01-14 ENCOUNTER — Telehealth: Payer: Self-pay

## 2013-01-14 NOTE — Telephone Encounter (Signed)
Patient advised of the results and the recommendation that he have follow up MRI in 6 months.  MRI was performed at [facility] and scanned in the chart

## 2013-01-18 ENCOUNTER — Encounter: Payer: Self-pay | Admitting: Gastroenterology

## 2013-01-31 ENCOUNTER — Encounter: Payer: 59 | Admitting: Gastroenterology

## 2013-02-04 ENCOUNTER — Telehealth: Payer: Self-pay | Admitting: Gastroenterology

## 2013-02-04 NOTE — Telephone Encounter (Signed)
Patient c/o slight abdominal pain in the am. He was changed from nexium to omeprazole at his EGD in Dec.  He is advised to remain on a antireflux diet and add another omeprazole at HS.  He is asked to keep his colonoscopy scheduled for March

## 2013-02-14 ENCOUNTER — Encounter: Payer: Self-pay | Admitting: Gastroenterology

## 2013-03-08 ENCOUNTER — Ambulatory Visit (AMBULATORY_SURGERY_CENTER): Payer: 59

## 2013-03-08 VITALS — Ht 73.0 in | Wt 225.0 lb

## 2013-03-08 DIAGNOSIS — Z860100 Personal history of colon polyps, unspecified: Secondary | ICD-10-CM

## 2013-03-08 MED ORDER — MOVIPREP 100 G PO SOLR: 1.0000 | Freq: Once | ORAL | Status: DC

## 2013-03-11 ENCOUNTER — Telehealth: Payer: Self-pay | Admitting: Gastroenterology

## 2013-03-11 NOTE — Telephone Encounter (Addendum)
Patient states that he has had 6 loose BM's today.  States also that he "can't get his bp below 150/100. States he has taken two doses of his BP meds.   Wants to know if he should cancel colonoscopy and see his medical doctor due to the high BP.   Patient leary of taking prep due to high salt content. He is asymptomatic at this time.  Spoke with Holland Falling, CRNA and Dr. Hilarie Fredrickson regarding this patient, and they agreed that the patient would be okay to do a colonoscopy.  I called the patient, and he will take the prep as ordered.   I also told him to call 911 if he has any chest pain, bad headache or any other unusual symptoms.   He did not want to reschedule.

## 2013-03-12 ENCOUNTER — Ambulatory Visit (AMBULATORY_SURGERY_CENTER): Payer: 59 | Admitting: Gastroenterology

## 2013-03-12 ENCOUNTER — Encounter: Payer: Self-pay | Admitting: Gastroenterology

## 2013-03-12 VITALS — BP 105/54 | HR 61 | Temp 98.9°F | Resp 25 | Ht 73.0 in | Wt 225.0 lb

## 2013-03-12 DIAGNOSIS — D126 Benign neoplasm of colon, unspecified: Secondary | ICD-10-CM

## 2013-03-12 DIAGNOSIS — Z86010 Personal history of colonic polyps: Secondary | ICD-10-CM

## 2013-03-12 MED ORDER — SODIUM CHLORIDE 0.9 % IV SOLN: 500.0000 mL | INTRAVENOUS | Status: DC

## 2013-03-12 NOTE — Progress Notes (Signed)
Called to room to assist during endoscopic procedure.  Patient ID and intended procedure confirmed with present staff. Received instructions for my participation in the procedure from the performing physician.  

## 2013-03-12 NOTE — Patient Instructions (Signed)
Discharge instructions given with verbal understanding. Handouts on polyps and hemorrhoids. Resume previous medications. YOU HAD AN ENDOSCOPIC PROCEDURE TODAY AT THE [facility]: Refer to the procedure report that was given to you for any specific questions about what was found during the examination.  If the procedure report does not answer your questions, please call your gastroenterologist to clarify.  If you requested that your care partner not be given the details of your procedure findings, then the procedure report has been included in a sealed envelope for you to review at your convenience later.  YOU SHOULD EXPECT: Some feelings of bloating in the abdomen. Passage of more gas than usual.  Walking can help get rid of the air that was put into your GI tract during the procedure and reduce the bloating. If you had a lower endoscopy (such as a colonoscopy or flexible sigmoidoscopy) you may notice spotting of blood in your stool or on the toilet paper. If you underwent a bowel prep for your procedure, then you may not have a normal bowel movement for a few days.  DIET: Your first meal following the procedure should be a light meal and then it is ok to progress to your normal diet.  A half-sandwich or bowl of soup is an example of a good first meal.  Heavy or fried foods are harder to digest and may make you feel nauseous or bloated.  Likewise meals heavy in dairy and vegetables can cause extra gas to form and this can also increase the bloating.  Drink plenty of fluids but you should avoid alcoholic beverages for 24 hours.  ACTIVITY: Your care partner should take you home directly after the procedure.  You should plan to take it easy, moving slowly for the rest of the day.  You can resume normal activity the day after the procedure however you should NOT DRIVE or use heavy machinery for 24 hours (because of the sedation medicines used during the test).    SYMPTOMS TO REPORT  IMMEDIATELY: A gastroenterologist can be reached at any hour.  During normal business hours, 8:30 AM to 5:00 PM Monday through Friday, call [phone].  After hours and on weekends, please call the GI answering service at [phone] who will take a message and have the physician on call contact you.   Following lower endoscopy (colonoscopy or flexible sigmoidoscopy):  Excessive amounts of blood in the stool  Significant tenderness or worsening of abdominal pains  Swelling of the abdomen that is new, acute  Fever of 100F or higher  FOLLOW UP: If any biopsies were taken you will be contacted by phone or by letter within the next 1-3 weeks.  Call your gastroenterologist if you have not heard about the biopsies in 3 weeks.  Our staff will call the home number listed on your records the next business day following your procedure to check on you and address any questions or concerns that you may have at that time regarding the information given to you following your procedure. This is a courtesy call and so if there is no answer at the home number and we have not heard from you through the emergency physician on call, we will assume that you have returned to your regular daily activities without incident.  SIGNATURES/CONFIDENTIALITY: You and/or your care partner have signed paperwork which will be entered into your electronic medical record.  These signatures attest to the fact that that the information above on your After Visit Summary   has been reviewed and is understood.  Full responsibility of the confidentiality of this discharge information lies with you and/or your care-partner. 

## 2013-03-12 NOTE — Op Note (Signed)
[place]  [address] [place], [address]   COLONOSCOPY PROCEDURE REPORT  PATIENT: Peter [name], Peter [name]  MR#: [id number] BIRTHDATE: 1955/05/24 , 79  yrs. old GENDER: Male ENDOSCOPIST: Ladene Artist, MD, [facility] PROCEDURE DATE:  03/12/2013 PROCEDURE:   Colonoscopy with biopsy and snare polypectomy First Screening Colonoscopy - Avg.  risk and is 50 yrs.  old or older - No.  Prior Negative Screening - Now for repeat screening. N/A  History of Adenoma - Now for follow-up colonoscopy & has been > or = to 3 yrs.  Yes hx of adenoma.  Has been 3 or more years since last colonoscopy.  Polyps Removed Today? Yes. ASA CLASS:   Class III INDICATIONS:Patient's personal history of adenomatous colon polyps.  MEDICATIONS: MAC sedation, administered by CRNA and propofol (Diprivan) 350mg  IV DESCRIPTION OF PROCEDURE:   After the risks benefits and alternatives of the procedure were thoroughly explained, informed consent was obtained.  A digital rectal exam revealed no abnormalities of the rectum.   The LB [id number]  endoscope was introduced through the anus and advanced to the cecum, which was identified by both the appendix and ileocecal valve. No adverse events experienced.   The quality of the prep was good, using MoviPrep  The instrument was then slowly withdrawn as the colon was fully examined.  COLON FINDINGS: A sessile polyp measuring 4 mm in size was found in the descending colon.  A polypectomy was performed with cold forceps.  The resection was complete and the polyp tissue was completely retrieved.   A sessile polyp measuring 6 mm in size was found in the sigmoid colon.  A polypectomy was performed with a cold snare.  The resection was complete and the polyp tissue was completely retrieved.   The colon was otherwise normal.  There was no diverticulosis, inflammation, polyps or cancers unless previously stated.  Retroflexed views revealed moderate  internal hemorrhoids. The time to cecum=3 minutes 44 seconds.  Withdrawal time=10 minutes 15 seconds.  The scope was withdrawn and the procedure completed. COMPLICATIONS: There were no complications.  ENDOSCOPIC IMPRESSION: 1.   Sessile polyp measuring 4 mm in the descending colon; polypectomy performed with cold forceps 2.   Sessile polyp measuring 6 mm in the sigmoid colon; polypectomy performed with a cold snare 3.   Moderate internal hemorrhoids  RECOMMENDATIONS: 1.  Await pathology results 2.  Repeat Colonoscopy in 5 years.  eSigned:  Ladene Artist, MD, [facility] 03/12/2013 2:06 PM   cc: Laurian Brim, MD

## 2013-03-12 NOTE — Progress Notes (Signed)
Report to pacu rn, vss, bbs=clear 

## 2013-03-13 ENCOUNTER — Telehealth: Payer: Self-pay | Admitting: *Deleted

## 2013-03-13 NOTE — Telephone Encounter (Signed)
  Follow up Call-  Call back number 03/12/2013 12/25/2012 02/02/2011  Post procedure Call Back phone  # [phone]  Permission to leave phone message Yes Yes -     Patient questions:  Do you have a fever, pain , or abdominal swelling? no Pain Score  0 *  Have you tolerated food without any problems? yes  Have you been able to return to your normal activities? yes  Do you have any questions about your discharge instructions: Diet   no Medications  no Follow up visit  no  Do you have questions or concerns about your Care? no  Actions: * If pain score is 4 or above: No action needed, pain <4.  Questions answered by spouse.

## 2013-03-18 ENCOUNTER — Encounter: Payer: Self-pay | Admitting: Gastroenterology

## 2013-03-21 ENCOUNTER — Other Ambulatory Visit: Payer: Self-pay | Admitting: Cardiology

## 2013-03-21 NOTE — Telephone Encounter (Signed)
See note from pharmacy requesting to change to a 10mg  tablet as patient stated that his bp is elevated. Please advise. Thanks, MI

## 2013-03-21 NOTE — Telephone Encounter (Signed)
Pharmacist stated pt says his bp is running 188C systolic  I spoke with pharmacist who will call pt about coming in to our office tomorrow for a blood pressure check. Horton Chin RN

## 2013-03-25 ENCOUNTER — Ambulatory Visit (INDEPENDENT_AMBULATORY_CARE_PROVIDER_SITE_OTHER): Payer: 59

## 2013-03-25 ENCOUNTER — Telehealth: Payer: Self-pay | Admitting: Neurology

## 2013-03-25 ENCOUNTER — Encounter: Payer: Self-pay | Admitting: Neurology

## 2013-03-25 ENCOUNTER — Ambulatory Visit (INDEPENDENT_AMBULATORY_CARE_PROVIDER_SITE_OTHER): Payer: 59 | Admitting: Neurology

## 2013-03-25 VITALS — BP 162/84 | HR 69 | Wt 232.0 lb

## 2013-03-25 DIAGNOSIS — F05 Delirium due to known physiological condition: Secondary | ICD-10-CM

## 2013-03-25 DIAGNOSIS — G5620 Lesion of ulnar nerve, unspecified upper limb: Secondary | ICD-10-CM

## 2013-03-25 NOTE — Progress Notes (Signed)
Reason for visit: Acute confusional state   Peter [name] is an 58 y.o. male  History of present illness:  Peter [name] is a 58 year old right-handed white male with a history of a left ulnar neuropathy. The patient has had a transposition surgery done, but he has not gained much benefit from the surgery itself. The patient indicates that he continues to have some clumsiness of the hand, and numbness of the fourth and fifth fingers of the right hand. The patient returns to this office primarily because of 2 acute confusional states that occurred recently. The first event occurred in December 2014. The patient was at a store, and then suddenly became confused. The patient picked up a package of plastic ice trays, and try to eat them. The patient was confused for about 3 hours, he had no recollection of any events during this period time. The patient went to [facility], and the patient's wife is not able to indicate what workup was done during that hospitalization. The patient had a second event 1 month ago while at home. The wife found the patient down stairs on the floor with a dog, and the patient thought that he was in his own bedroom. The patient remained confused for about 15 minutes, and then the confusion cleared. The patient acts normally between these events. The patient never blacked out, or had any jerking of the extremities. The patient is sent to this office for further evaluation.  Past Medical History  Diagnosis Date  . Hypertension   . Anxiety   . Palpitations   . Hyperlipidemia   . Internal hemorrhoids   . Tubular adenoma of colon 03/2008  . Arthritis   . Murmur, cardiac   . Esophageal stricture   . Lesion of ulnar nerve 09/03/2012    Right ulnar neuropathy  . Abnormality of gait 09/03/2012  . Alcohol abuse   . Cirrhosis   . Ascites   . GERD (gastroesophageal reflux disease)   . Seizures     pt states he had seizure 12/14  . Acute confusional state 03/25/2013    Past  Surgical History  Procedure Laterality Date  . Hand surgery      left  . Circumcision    . Colonoscopy    . Elbow surgery    . Shoulder surgery      Family History  Problem Relation Age of Onset  . Diabetes Father   . Kidney disease Father   . Lung cancer Father     smoker  . Cancer Father   . Hyperlipidemia Father   . Hypertension Father   . Lung cancer Mother     smoke  . Cancer Mother   . Hypertension Mother   . Colon cancer Neg Hx   . Pancreatic cancer Neg Hx   . Stomach cancer Neg Hx     Social history:  reports that he has been smoking Cigarettes.  He has a 12 pack-year smoking history. He has never used smokeless tobacco. He reports that he drinks about 7.2 ounces of alcohol per week. He reports that he uses illicit drugs (Marijuana).    Allergies  Allergen Reactions  . Crestor [Rosuvastatin] Other (See Comments)    Bloating, swelling of legs    Medications:  Current Outpatient Prescriptions on File Prior to Visit  Medication Sig Dispense Refill  . allopurinol (ZYLOPRIM) 300 MG tablet Take 300 mg by mouth daily. Marland Kitchen5 tablets daily      . busPIRone (BUSPAR) 30  MG tablet Take 45 mg by mouth every morning.        Marland Kitchen BYSTOLIC 5 MG tablet TAKE 1 TABLET (5 MG TOTAL) BY MOUTH DAILY.  30 tablet  10  . chlordiazePOXIDE (LIBRIUM) 5 MG capsule Take 5 mg by mouth 3 (three) times daily as needed.      . milk thistle 175 MG tablet Take 175 mg by mouth daily.      . Multiple Vitamin (MULTIVITAMIN) tablet Take 1 tablet by mouth daily. Centrum Plus -Take 1 daily      . omeprazole (PRILOSEC) 20 MG capsule Take 20 mg by mouth daily.       No current facility-administered medications on file prior to visit.    ROS:  Out of a complete 14 system review of symptoms, the patient complains only of the following symptoms, and all other reviewed systems are negative.  Heart murmur Swollen abdomen, diarrhea Snoring, sleep talking Bruising easily Memory loss, passing out Behavior  problem, confusion  Blood pressure 162/84, pulse 69, weight 232 lb (105.235 kg).  Physical Exam  General: The patient is alert and cooperative at the time of the examination. The patient is moderately obese.  Skin: No significant peripheral edema is noted.   Neurologic Exam  Mental status: The patient is oriented x 3. Mini-Mental status examination done today shows total score of 29/30.  Cranial nerves: Facial symmetry is present. Speech is normal, no aphasia or dysarthria is noted. Extraocular movements are full. Visual fields are full.  Motor: The patient has good strength in all 4 extremities.  Sensory examination: Soft touch sensation on the face, arms, and legs is symmetric.  Coordination: The patient has good finger-nose-finger and heel-to-shin bilaterally.  Gait and station: The patient has a normal gait. Tandem gait is normal. Romberg is negative. No drift is seen.  Reflexes: Deep tendon reflexes are symmetric.    MRI brain 10/05/12:  IMPRESSION:  Mildly abnormal MRI brain (without) demonstrating: 1. Few scattered periventricular and subcortical foci of non-specific gliosis. 2. Non-specific fluid/inflammation in the mastoid air cells. Mucosal thickening in the maxillary, ethmoid, frontal and sphenoid sinuses.  3. No acute findings.     Assessment/Plan:  1. Right ulnar neuropathy  2. Acute confusional state  The patient indicates that his alcohol intake has been curtailed, and he drinks an occasional beer. The patient was on Ultram taking 4 tablets daily when the first confusional event occurred. The patient has had 2 events, and these certainly could represent seizures. The patient had MRI evaluation of the brain in September of 2014 showing minimal small vessel disease. These 2 confusional events have occurred since the last MRI of the brain. The patient will undergo an EEG study, and he will undergo MRI evaluation of the brain with and without gadolinium  enhancement. The patient is not to operate a motor vehicle until further notice. The patient denies any further issues with balance.   Addendum: The EEG study done today was normal.  Jill Alexanders MD 03/25/2013 9:12 PM  [facility] [address] [place], [place] [address]  Phone [phone] Fax [phone]

## 2013-03-25 NOTE — Telephone Encounter (Signed)
I called patient, talked with the patient and wife. EEG study was normal. I would not recommend the patient drives for least 3-4 months. If another event occurs, we will consider treating him  for seizures. MRI the brain is pending.

## 2013-03-25 NOTE — Procedures (Signed)
    History:  Peter [name] is a 58 year old gentleman with a history of confusional episodes that have occurred in December of 2014, with recurrence one month ago. The patient is being evaluated for possible seizures.  This is a routine EEG. No skull defects are noted. Medications include allopurinol, BuSpar, Bystolic, Librium, multivitamins, and Prilosec.   EEG classification: Normal awake  Description of the recording: The background rhythms of this recording consists of a fairly well modulated medium amplitude alpha rhythm of 10 Hz that is reactive to eye opening and closure. As the record progresses, the patient appears to remain in the waking state throughout the recording. An overlying beta frequency activity is seen throughout the recording. Photic stimulation was performed, resulting in a bilateral and symmetric photic driving response. Hyperventilation was also performed, resulting in a minimal buildup of the background rhythm activities without significant slowing seen. At no time during the recording does there appear to be evidence of spike or spike wave discharges or evidence of focal slowing. EKG monitor shows no evidence of cardiac rhythm abnormalities with a heart rate of 66.  Impression: This is a normal EEG recording in the waking state. No evidence of ictal or interictal discharges are seen. The beta frequency activity seen throughout the recording may be related to medication effect from benzodiazepines such as Librium.

## 2013-04-04 ENCOUNTER — Other Ambulatory Visit: Payer: 59

## 2013-04-10 ENCOUNTER — Ambulatory Visit: Payer: 59 | Admitting: Physician Assistant

## 2013-04-11 ENCOUNTER — Other Ambulatory Visit: Payer: 59

## 2013-04-11 ENCOUNTER — Ambulatory Visit (INDEPENDENT_AMBULATORY_CARE_PROVIDER_SITE_OTHER): Payer: 59 | Admitting: Nurse Practitioner

## 2013-04-11 ENCOUNTER — Ambulatory Visit: Payer: 59

## 2013-04-11 ENCOUNTER — Encounter: Payer: Self-pay | Admitting: Nurse Practitioner

## 2013-04-11 VITALS — BP 150/90 | HR 70 | Ht 73.0 in | Wt 235.8 lb

## 2013-04-11 DIAGNOSIS — I5032 Chronic diastolic (congestive) heart failure: Secondary | ICD-10-CM

## 2013-04-11 LAB — BASIC METABOLIC PANEL
BUN: 7 mg/dL (ref 6–23)
CO2: 26 mEq/L (ref 19–32)
Calcium: 9.4 mg/dL (ref 8.4–10.5)
Chloride: 103 mEq/L (ref 96–112)
Creatinine, Ser: 0.6 mg/dL (ref 0.4–1.5)
GFR: 139.65 mL/min (ref >60.00)
Glucose, Bld: 160 mg/dL — ABNORMAL HIGH (ref 70–99)
Potassium: 3.8 mEq/L (ref 3.5–5.1)
Sodium: 139 mEq/L (ref 135–145)

## 2013-04-11 MED ORDER — GADOPENTETATE DIMEGLUMINE 469.01 MG/ML IV SOLN: 20.0000 mL | Freq: Once | INTRAVENOUS | Status: AC | PRN

## 2013-04-11 MED ORDER — LISINOPRIL 10 MG PO TABS: 10.0000 mg | ORAL_TABLET | Freq: Every day | ORAL | Status: DC

## 2013-04-11 NOTE — Progress Notes (Signed)
Buckner [name] Date of Birth: 1955-01-18 Medical Record #[id number]  History of Present Illness: Peter [name] is seen back today for a follow up visit. Seen for Dr. Aundra Dubin. He is a 58 year old male with HTN, HLD and PVCs. Ongoing tobacco abuse.  Last seen here in September. Had stopped medicines after actively losing weight and BP was low. Was still smoking and drinking alcohol.  Comes back today. Here with his wife. History is quite hard to follow. Says that Crestor gave him a fatty liver. Looks like he has had an abnormal MRI of his abdomen and unsuccessful attempt at paracentesis. Seeing Dr. Fuller Plan. Has had spells of "confusion" ?seizure activity and is seening neurology - was to have repeat MRI of his brain today but the machine broke down - for scan on Monday. Not able to drive. No palpitations. Not short of breath. No chest pain. Belly bloated. BP running up. Does not quantify how much alcohol he drinks. Says he "loves beer" - drinks daily and has ever since "he was legal". Continues to smoke.    Current Outpatient Prescriptions  Medication Sig Dispense Refill  . busPIRone (BUSPAR) 30 MG tablet Take 45 mg by mouth every morning.        Marland Kitchen BYSTOLIC 5 MG tablet TAKE 1 TABLET (5 MG TOTAL) BY MOUTH DAILY.  30 tablet  10  . chlordiazePOXIDE (LIBRIUM) 5 MG capsule Take 5 mg by mouth 3 (three) times daily as needed.      . colchicine 0.6 MG tablet Take 0.6 mg by mouth as needed. For gout flare ups      . milk thistle 175 MG tablet Take 175 mg by mouth daily.      . Multiple Vitamin (MULTIVITAMIN) tablet Take 1 tablet by mouth daily. Centrum Plus -Take 1 daily      . NON FORMULARY once a week. Black cherry extract ( concentrate)      . omeprazole (PRILOSEC) 20 MG capsule Take 20 mg by mouth daily.      . VOLTAREN 1 % GEL as needed.        No current facility-administered medications for this visit.   Facility-Administered Medications Ordered in Other Visits  Medication Dose Route Frequency  Provider Last Rate Last Dose  . gadopentetate dimeglumine (MAGNEVIST) injection 20 mL  20 mL Intravenous Once PRN Kathrynn Ducking, MD        Allergies  Allergen Reactions  . Crestor [Rosuvastatin] Other (See Comments)    Bloating, swelling of legs    Past Medical History  Diagnosis Date  . Hypertension   . Anxiety   . Palpitations   . Hyperlipidemia   . Internal hemorrhoids   . Tubular adenoma of colon 03/2008  . Arthritis   . Murmur, cardiac   . Esophageal stricture   . Lesion of ulnar nerve 09/03/2012    Right ulnar neuropathy  . Abnormality of gait 09/03/2012  . Alcohol abuse   . Cirrhosis   . Ascites   . GERD (gastroesophageal reflux disease)   . Seizures     pt states he had seizure 12/14  . Acute confusional state 03/25/2013    Past Surgical History  Procedure Laterality Date  . Hand surgery      left  . Circumcision    . Colonoscopy    . Elbow surgery    . Shoulder surgery      History  Smoking status  . Current Every Day Smoker -- 0.50 packs/day  for 24 years  . Types: Cigarettes  Smokeless tobacco  . Never Used    History  Alcohol Use  . 7.2 oz/week  . 12 Cans of beer per week    Comment: 4 per day, history of alcohol abuse, pt states that he has "cut back" 12/14    Family History  Problem Relation Age of Onset  . Diabetes Father   . Kidney disease Father   . Lung cancer Father     smoker  . Cancer Father   . Hyperlipidemia Father   . Hypertension Father   . Lung cancer Mother     smoke  . Cancer Mother   . Hypertension Mother   . Colon cancer Neg Hx   . Pancreatic cancer Neg Hx   . Stomach cancer Neg Hx     Review of Systems: The review of systems is per the HPI.  All other systems were reviewed and are negative.  Physical Exam: BP 150/90  Pulse 70  Ht 6\' 1"  (1.854 m)  Wt 235 lb 12.8 oz (106.958 kg)  BMI 31.12 kg/m2  SpO2 96% Weight is up 10 pounds in the past month.  Patient is alert and in no acute distress. Smells of  tobacco. Skin is warm and dry. Color is normal.  HEENT is unremarkable. Normocephalic/atraumatic. PERRL. Sclera are nonicteric. Neck is supple. No masses. No JVD. Lungs are clear. Cardiac exam shows a regular rate and rhythm.+S4.  Abdomen is protuberant but soft. Extremities are without edema. Gait and ROM are intact. No gross neurologic deficits noted.  Wt Readings from Last 3 Encounters:  04/11/13 235 lb 12.8 oz (106.958 kg)  03/25/13 232 lb (105.235 kg)  03/12/13 225 lb (102.059 kg)     LABORATORY DATA: Lab Results  Component Value Date   TSH 0.959 09/03/2012    Echo Study Conclusions from 2011  - Left ventricle: The cavity size was normal. Wall thickness was increased in a pattern of mild LVH. There was mild concentric hypertrophy. Systolic function was normal. The estimated ejection fraction was in the range of 60% to 65%. Wall motion was normal; there were no regional wall motion abnormalities. Doppler parameters are consistent with abnormal left ventricular relaxation (grade 1 diastolic dysfunction). - Left atrium: The atrium was mildly dilated. - Atrial septum: No defect or patent foramen ovale was identified.    Assessment / Plan: 1. HTN - will restart Lisinopril 10 mg a day, check BMET today.  2. ?fatty liver/cirrhosis - he does not quantify his alcohol use - continues to drink. Needs echo. Says he has not tolerated diuretic in the past - but not able to be any more specific. Really not receptive to hearing about abstaining from alcohol.   3. HLD - off statin  4. Abnormal MRI of the abdomen - seeing Dr. Fuller Plan  5. "Confusion" episodes - seeing neurology.   Patient is agreeable to this plan and will call if any problems develop in the interim.   Peter Junes, RN, [facility] [address] [place], [place]  [phone]

## 2013-04-11 NOTE — Patient Instructions (Signed)
We will check a potassium level and kidney function today  We need to get an ultrasound of your heart  Restart Lisinopril 10 mg a day - this is in addition to your Bystolic  Call the [place] office at [phone] if you have any questions, problems or concerns.

## 2013-04-15 ENCOUNTER — Encounter: Payer: Self-pay | Admitting: Gastroenterology

## 2013-04-15 ENCOUNTER — Ambulatory Visit (INDEPENDENT_AMBULATORY_CARE_PROVIDER_SITE_OTHER): Payer: 59 | Admitting: Gastroenterology

## 2013-04-15 VITALS — BP 154/90 | HR 72 | Ht 72.0 in | Wt 233.4 lb

## 2013-04-15 DIAGNOSIS — K700 Alcoholic fatty liver: Secondary | ICD-10-CM

## 2013-04-15 DIAGNOSIS — R933 Abnormal findings on diagnostic imaging of other parts of digestive tract: Secondary | ICD-10-CM

## 2013-04-15 DIAGNOSIS — R569 Unspecified convulsions: Secondary | ICD-10-CM

## 2013-04-15 MED ORDER — PANTOPRAZOLE SODIUM 40 MG PO TBEC: 40.0000 mg | DELAYED_RELEASE_TABLET | Freq: Every day | ORAL | Status: DC

## 2013-04-15 NOTE — Patient Instructions (Addendum)
You have been given a separate informational sheet regarding your tobacco use, the importance of quitting and local resources to help you quit.  Stop taking your omeprazole.   We have sent the following medications to your pharmacy for you to pick up at your convenience:pantoprazole.  Please call me back regarding scheduling your MRI/ MRCP of the abdomen. Ask to speak to [facility]. This should be scheduled in late May or June.   Thank you for choosing me and [facility].  Pricilla Riffle. Dagoberto Ligas., MD., Marval Regal

## 2013-04-15 NOTE — Progress Notes (Signed)
    History of Present Illness: This is a 58 year old male returns today accompanied by his wife. He complaints of slightly decreased appetite for the past several months, occasional mild epigastric discomfort and mild diarrhea that he feels are related to taking omeprazole. He takes omeprazole as needed. I reviewed the results of his CT, MRI, EGD and blood work with the patient and his wife. I answered their questions.  Current Medications, Allergies, Past Medical History, Past Surgical History, Family History and Social History were reviewed in [employer] record.  Physical Exam: General: Well developed , well nourished, no acute distress Head: Normocephalic and atraumatic Eyes:  sclerae anicteric, EOMI Ears: Normal auditory acuity Mouth: No deformity or lesions Lungs: Clear throughout to auscultation Heart: Regular rate and rhythm; no murmurs, rubs or bruits Abdomen: Soft, non tender and distended. Hepatomegaly with liver edge 2 FB below R costal margin. Left lobe palpable in epigastrium. No masses, splenomegaly or hernias noted. Normal Bowel sounds. Musculoskeletal: Symmetrical with no gross deformities  Pulses:  Normal pulses noted Extremities: No clubbing, cyanosis, or deformities noted. Trace L ankle edema Neurological: Alert oriented x 4, appears to have some memory deficits Psychological:  Alert and cooperative. Normal mood and affect  Assessment and Recommendations:  1. Fatty liver, hepatomegaly, concern for underlying cirrhosis. Avoid all etoh, follow a low-fat diet and a weight loss program supervised by his PCP. All hepatic serologies were  negative. Repeat LFTs and PT/INR at next office appointment in 6 months. He did not complete his hepatitis A and B vaccinations so we will resume the vaccination protocol.  2. GERD. Changed to pantoprazole 40 mg daily. I  discussed the importance of taking a PPI on a daily basis along with antireflux measures to prevent  reflux symptoms.  3. 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 Follow up MRI/MRCP at 6 months as recommended.   4 Personal history of adenomatous colon polyps. Surveillance colonoscopy recommended March 2020.

## 2013-04-16 ENCOUNTER — Telehealth: Payer: Self-pay

## 2013-04-16 NOTE — Telephone Encounter (Addendum)
Left a message for patient to return my call to schedule his MRI/MRCP and re-start Twinrix injections.

## 2013-04-17 NOTE — Telephone Encounter (Signed)
Spoke with patient and reminded him that he has not called me regarding scheduling his MRI/MRCP. Pt states he is still coordinating his schedule with his wife but will call me on Friday to schedule. Also informed patient that he never came back for his 3rd Twinrix injection in January. Patient states he only needs one more. I told patient that he actually needs 4 total injections and missed his third one. Told patient and because it has been so long since last injection he will have to start the injections over. Patient states it smells like money and insurance and is not sure he wants to go through with more injections due to cost. Patient states that it's not that he doesn't trust [place] because we are the professionals but wants to do some research before he agrees to start the injections over. Told patient it was his decision but it is important that he has these injections. Patient states he will let me know on Friday if he decides to get injections.

## 2013-04-18 ENCOUNTER — Telehealth: Payer: Self-pay | Admitting: Neurology

## 2013-04-18 NOTE — Telephone Encounter (Signed)
I called the patient. MRI the brain shows no significant change from one done in September 2014. If the patient has another episode of confusion or staring off, we will initiate treatment with Keppra. The patient is to stay off of the Ultram.   MRI brain 04/18/2013:  IMPRESSION: Abnormal MRI scan the brain showing mild changes of chronic microvascular ischemia and generalized cerebral atrophy. There are incidental changes of mild paranasal sinusitis and mastoiditis. Overall no significant change compared with previous MRI scan dated 10/03/2012

## 2013-04-19 ENCOUNTER — Other Ambulatory Visit: Payer: Self-pay | Admitting: Gastroenterology

## 2013-04-19 ENCOUNTER — Telehealth: Payer: Self-pay | Admitting: Neurology

## 2013-04-19 DIAGNOSIS — R933 Abnormal findings on diagnostic imaging of other parts of digestive tract: Secondary | ICD-10-CM

## 2013-04-19 NOTE — Telephone Encounter (Signed)
Returned call to patient re: MRI results. Results were left by Dr. Jannifer Franklin, patient had questions.

## 2013-04-19 NOTE — Telephone Encounter (Signed)
Patient returning call regarding MRI results. °

## 2013-04-19 NOTE — Telephone Encounter (Signed)
Patient states his bp med was changed. He has not had anymore problems since. He would like to know if he can resume driving at this time. Please advise.

## 2013-04-19 NOTE — Telephone Encounter (Signed)
Patient states he is establishing care in [facility] in [place] and would like to get his Twinrix injections restarted there since it is closer to his home. Told patient that was fine. I called [facility] in [place] and they did verify they have Twinrix in stock and can give him this on the day of his NP appt on 05/14/13 with Charolette Forward, NP. Told patient to remind her that he needs this injection and there is a total of 4 injections that he has to get to finish the series. Patient verbalized understanding.

## 2013-04-19 NOTE — Telephone Encounter (Signed)
I called patient. I would not recommend that he try this point. The patient indicated that his blood pressure was going too low, and his blood pressure medications have been readjusted. I would like to see him go at least 3 months without any further spells before he is back to driving. He is to contact me if another episode occurs, and we'll consider treatment with Keppra.

## 2013-04-19 NOTE — Telephone Encounter (Signed)
Scheduled MRI/MRCP at [facility] for 06/10/13 at 10:45am. Faxed order to [facility]. Pt notified and verbalized understanding.

## 2013-04-24 ENCOUNTER — Ambulatory Visit (HOSPITAL_COMMUNITY): Payer: 59 | Attending: Internal Medicine | Admitting: Radiology

## 2013-04-24 DIAGNOSIS — I509 Heart failure, unspecified: Secondary | ICD-10-CM

## 2013-04-24 DIAGNOSIS — I5032 Chronic diastolic (congestive) heart failure: Secondary | ICD-10-CM

## 2013-04-24 DIAGNOSIS — R1900 Intra-abdominal and pelvic swelling, mass and lump, unspecified site: Secondary | ICD-10-CM | POA: Insufficient documentation

## 2013-04-24 DIAGNOSIS — R011 Cardiac murmur, unspecified: Secondary | ICD-10-CM | POA: Insufficient documentation

## 2013-04-24 NOTE — Progress Notes (Signed)
Echocardiogram Performed. 

## 2013-04-25 ENCOUNTER — Telehealth: Payer: Self-pay | Admitting: Nurse Practitioner

## 2013-04-25 NOTE — Telephone Encounter (Signed)
Following up     Pt returning call for his test results.   Please give him a call at home.

## 2013-04-25 NOTE — Telephone Encounter (Signed)
Follow up  ° ° ° °Returning call back to nurse  °

## 2013-05-07 ENCOUNTER — Other Ambulatory Visit: Payer: Self-pay | Admitting: *Deleted

## 2013-05-07 ENCOUNTER — Telehealth: Payer: Self-pay | Admitting: *Deleted

## 2013-05-07 DIAGNOSIS — I10 Essential (primary) hypertension: Secondary | ICD-10-CM

## 2013-05-07 MED ORDER — LISINOPRIL 10 MG PO TABS: 10.0000 mg | ORAL_TABLET | Freq: Two times a day (BID) | ORAL | Status: DC

## 2013-05-07 NOTE — Telephone Encounter (Signed)
S/w pt stated bp this am 180/70 took medication lisinopril  10 mg)and bystolic ( 5 mg ) daily bp drops to 140/80 takes bp again 180/90 takes another lisinipril and bystolic and feels better, has headaches. Is down to 4 cigs a day and 4 beers a day Cecille Rubin wants pt to get a bmet tomorrow and increase lisinopril to ( 10 mg ) bid will repeat bmet when pt sees Dr. Aundra Dubin

## 2013-05-07 NOTE — Telephone Encounter (Signed)
Follow Up  Pt requests that this message is sent to the office of Np. Truitt Merle. Pt is requesting a call back to discuss changing his BP medications. He states that he is having continual BP issues and that he cannot get his BP under control. Requests a call back to discuss.

## 2013-05-09 ENCOUNTER — Telehealth: Payer: Self-pay | Admitting: Gastroenterology

## 2013-05-09 NOTE — Telephone Encounter (Signed)
Patient did no return for the remainder of his Twinrix.  Will need to start the series over.  He will come for injection #1 on 5/5 11:00.

## 2013-05-10 ENCOUNTER — Other Ambulatory Visit: Payer: Self-pay | Admitting: Cardiology

## 2013-05-11 LAB — BASIC METABOLIC PANEL
BUN/Creatinine Ratio: 16 (ref 9–20)
BUN: 12 mg/dL (ref 6–24)
CALCIUM: 9.2 mg/dL (ref 8.7–10.2)
CHLORIDE: 97 mmol/L (ref 97–108)
CO2: 20 mmol/L (ref 18–29)
Creatinine, Ser: 0.74 mg/dL — ABNORMAL LOW (ref 0.76–1.27)
GFR calc Af Amer: 119 mL/min/{1.73_m2} (ref >59)
GFR, EST NON AFRICAN AMERICAN: 103 mL/min/{1.73_m2} (ref >59)
GLUCOSE: 99 mg/dL (ref 65–99)
Potassium: 5 mmol/L (ref 3.5–5.2)
Sodium: 137 mmol/L (ref 134–144)

## 2013-05-14 ENCOUNTER — Ambulatory Visit: Payer: 59 | Admitting: Adult Health

## 2013-05-15 ENCOUNTER — Ambulatory Visit (INDEPENDENT_AMBULATORY_CARE_PROVIDER_SITE_OTHER): Payer: 59 | Admitting: Gastroenterology

## 2013-05-15 DIAGNOSIS — Z23 Encounter for immunization: Secondary | ICD-10-CM

## 2013-05-20 ENCOUNTER — Telehealth: Payer: Self-pay | Admitting: Nurse Practitioner

## 2013-05-20 NOTE — Telephone Encounter (Signed)
New message     Want lab results----OK to leave msg on vm

## 2013-05-20 NOTE — Telephone Encounter (Signed)
Left lab results on pt's vm per pt's request

## 2013-05-21 ENCOUNTER — Telehealth: Payer: Self-pay

## 2013-05-21 NOTE — Telephone Encounter (Signed)
Spoke with patient and reminded him of appointment tomorrow.  He said that for 3 days after his Twinrix he was real achy and felt bad.  Feeling much better today.  Should we advise him to take ibuprofen after his Twinrix tomorrow to hopefully prevent this from happening again?

## 2013-05-21 NOTE — Telephone Encounter (Signed)
Ibuprofen or Tylenol as directed on label for the day of the injection and for a few days after as needed

## 2013-05-21 NOTE — Telephone Encounter (Signed)
Dr. Fuller Plan please review question and advise

## 2013-05-21 NOTE — Telephone Encounter (Signed)
Message copied by [place], Vashaun Osmon E on Tue May 21, 2013 11:58 AM ------      Message from: [place], Janiece Scovill E      Created: Wed May 15, 2013  3:36 PM       Call and remind him to come tomorrow,05/22/13, for his Twinrix.at 2:45pm, [phone] ------

## 2013-05-21 NOTE — Telephone Encounter (Signed)
I have left a message for the patient with Dr. Lynne Leader recommendations.  He is asked to call for any questions prior to coming for injection tomorrow

## 2013-05-21 NOTE — Telephone Encounter (Signed)
Patient will be informed tomorrow when he comes in for his injection to use the Ibuprofen or Tylenol as Dr. Fuller Plan advised.  I have informed Peter [name], CMA who is doing injections tomorrow pm of this information.

## 2013-05-22 ENCOUNTER — Ambulatory Visit (INDEPENDENT_AMBULATORY_CARE_PROVIDER_SITE_OTHER): Payer: 59 | Admitting: Gastroenterology

## 2013-05-22 DIAGNOSIS — K7460 Unspecified cirrhosis of liver: Secondary | ICD-10-CM

## 2013-05-22 DIAGNOSIS — Z23 Encounter for immunization: Secondary | ICD-10-CM

## 2013-05-23 ENCOUNTER — Encounter: Payer: Self-pay | Admitting: *Deleted

## 2013-05-23 ENCOUNTER — Other Ambulatory Visit: Payer: Self-pay | Admitting: *Deleted

## 2013-05-30 ENCOUNTER — Encounter: Payer: Self-pay | Admitting: Cardiology

## 2013-05-30 ENCOUNTER — Ambulatory Visit (INDEPENDENT_AMBULATORY_CARE_PROVIDER_SITE_OTHER): Payer: 59 | Admitting: Cardiology

## 2013-05-30 VITALS — BP 176/86 | HR 78 | Ht 73.0 in | Wt 234.0 lb

## 2013-05-30 DIAGNOSIS — I10 Essential (primary) hypertension: Secondary | ICD-10-CM

## 2013-05-30 DIAGNOSIS — I5032 Chronic diastolic (congestive) heart failure: Secondary | ICD-10-CM

## 2013-05-30 DIAGNOSIS — E785 Hyperlipidemia, unspecified: Secondary | ICD-10-CM

## 2013-05-30 DIAGNOSIS — F17200 Nicotine dependence, unspecified, uncomplicated: Secondary | ICD-10-CM

## 2013-05-30 MED ORDER — NEBIVOLOL HCL 10 MG PO TABS: 10.0000 mg | ORAL_TABLET | Freq: Every day | ORAL | Status: DC

## 2013-05-30 NOTE — Patient Instructions (Signed)
Increase bystolic to 10mg  daily. You can take 2 of your 5mg  tablets daily at the same time and use your current supply.  Your physician wants you to follow-up in: 6 months with Dr Aundra Dubin. (November 2015). You will receive a reminder letter in the mail two months in advance. If you don't receive a letter, please call our office to schedule the follow-up appointment.

## 2013-05-31 NOTE — Progress Notes (Signed)
Patient ID: Peter [name], male   DOB: [birth year], 58 y.o.   MRN: [id number] PCP: Laurian Brim  58 yo with history of HTN, hyperlipidemia, and PVCs presents for cardiology followup.   He was last seen by Truitt Merle.  Weight is 234 today.  This is a considerable rise over the last year (about 30 lbs).  Corresponding to this rise in weight, his blood pressure has been running higher (176/86 today).  He stopped his statin because of liver problems.  He has a fatty liver and ?cirrhosis (seeing GI).  He is going to have a liver MRI.  He has a history of heavy ETOH intake.  He has cut back to 4 beers/day which is less than in the past.  He is still smoking 1 pack/week.  He is not getting much exercise, has had some pain in his knee.  He tries to watch his diet.  He denies exertional dyspnea and chest pain.  Last echo in 4/15 showed EF 60-65% with no significant abnormalities.   Labs (12/11): K 4.7, creatinine 0.96, TGs 268, LDL 183, HDL 57  Labs (11/12): creatinine 0.67 Labs (5/15): K 5, creatinine 0.74  ECG: sinus tachycardia, inferior Qs of ?significance.    Allergies (verified):  No Known Drug Allergies   Past Medical History:  1. HTN  2. Echo (12/11): EF 60-65%, mild LVH, no regional WMAs, grade I diastolic dysfunction, mild LAE.  Echo (4/15) with EF 40-98%, grade I diastolic dysfunction.  3. Anxiety  4. Palpitations: Holter monitor 2004 with occasional PVCs. Holter (12/11) with PACs and PVCs (not common).  5. Nuclear stress test > 10 years ago: normal per patient.  6. Active smoker 7. Hyperlipidemia. Myalgias with Lipitor.  Weakness with Crestor 20.  8. GERD with history of esophageal stricture and dilation.  9. Ulnar neuropathy 10. Fatty liver, ?cirrhosis: has history of heavy ETOH.  11. H/o seizure  Family History:  No premature CAD   Social History:  Smokes 1 pack/week +ETOH Lives in [place], married  Retired Therapist, music for [employer] reviewed and negative except as per HPI.   Current Outpatient Prescriptions  Medication Sig Dispense Refill  . busPIRone (BUSPAR) 30 MG tablet Take 45 mg by mouth every morning.        . chlordiazePOXIDE (LIBRIUM) 5 MG capsule Take 5 mg by mouth 3 (three) times daily as needed.      . colchicine 0.6 MG tablet Take 0.6 mg by mouth as needed. For gout flare ups      . lisinopril (PRINIVIL,ZESTRIL) 10 MG tablet Take 1 tablet (10 mg total) by mouth 2 (two) times daily.  60 tablet  3  . milk thistle 175 MG tablet Take 175 mg by mouth daily.      . Multiple Vitamin (MULTIVITAMIN) tablet Take 1 tablet by mouth daily. Centrum Plus -Take 1 daily      . NON FORMULARY once a week. Black cherry extract ( concentrate)      . pantoprazole (PROTONIX) 40 MG tablet Take 1 tablet (40 mg total) by mouth daily.  30 tablet  11  . VOLTAREN 1 % GEL as needed.       . nebivolol (BYSTOLIC) 10 MG tablet Take 1 tablet (10 mg total) by mouth daily.  90 tablet  2   No current facility-administered medications for this visit.    BP 176/86  Pulse 78  Ht 6\' 1"  (1.854 m)  Wt  106.142 kg (234 lb)  BMI 30.88 kg/m2 General: NAD Neck: No JVD, no thyromegaly or thyroid nodule.  Lungs: Clear to auscultation bilaterally with normal respiratory effort. CV: Nondisplaced PMI.  Heart mildly tachycardic, regular S1/S2, soft S4, no murmur.  No peripheral edema.  No carotid bruit.  Normal pedal pulses.  Abdomen: Soft, nontender, no hepatosplenomegaly, no distention.  Neurologic: Alert and oriented x 3.  Psych: Normal affect. Extremities: No clubbing or cyanosis.   Assessment/Plan: 1. HTN: Weight is up 30 lbs over the last year with a corresponding rise in blood pressure.   - Patient needs to exercise more (walk) and cut back on po intake.  - He needs to cut back more on ETOH - Increase Bystolic to 10 mg daily.  No change in lisinopril given high normal K on recent BMET. 2. Hyperlipidemia: LDL has been  very high in the past. He was on Crestor 5 mg daily but stopped it because of liver issues (fatty liver, ?cirrhosis).  I think this is likely due to ETOH and not affected by the statin.  He could probably restart the low dose Crestor though he is not interested in doing that right now.   3. He should start ASA 81 mg daily.  4. Smoking: I strongly encouraged him to stop smoking.   Mr Lindholm needs a number of lifestyle changes (weight loss, smoking cessation, ETOH curtailment) or I am afraid that his health is going to significantly worsen in the future.   Larey Dresser 05/31/2013

## 2013-06-10 ENCOUNTER — Ambulatory Visit: Payer: Self-pay | Admitting: Gastroenterology

## 2013-06-11 ENCOUNTER — Ambulatory Visit (INDEPENDENT_AMBULATORY_CARE_PROVIDER_SITE_OTHER): Payer: 59 | Admitting: Gastroenterology

## 2013-06-11 DIAGNOSIS — Z23 Encounter for immunization: Secondary | ICD-10-CM

## 2013-06-12 ENCOUNTER — Telehealth: Payer: Self-pay

## 2013-06-12 NOTE — Telephone Encounter (Signed)
Per Dr. Fuller Plan there is no change in the cystic lesion since previous MRI/MRCP done in 12/2012. Dr. Fuller Plan wants to follow Radiologist recommendations with having a repeat MRI/MRCP in 6 months.   Left a message for patient to return my call.

## 2013-06-13 NOTE — Telephone Encounter (Signed)
Patient notified of MRI/MRCP results and recommendations of Dr. Fuller Plan. Pt agreed and I will put a reminder in system for 6 month f/u MRI/MRCP for patient.

## 2013-11-02 ENCOUNTER — Other Ambulatory Visit: Payer: Self-pay | Admitting: Nurse Practitioner

## 2013-12-10 ENCOUNTER — Telehealth: Payer: Self-pay | Admitting: *Deleted

## 2013-12-10 ENCOUNTER — Other Ambulatory Visit: Payer: Self-pay | Admitting: Nurse Practitioner

## 2013-12-10 DIAGNOSIS — K862 Cyst of pancreas: Secondary | ICD-10-CM

## 2013-12-10 NOTE — Telephone Encounter (Signed)
-----   Message from Marzella Schlein, [place] sent at 06/13/2013  2:13 PM EDT ----- Patient needs 6 month f/u MRI/MRCP for pancreatic head cyst and peripancreatic lymph node.

## 2013-12-10 NOTE — Telephone Encounter (Signed)
Patient has been scheduled for MRI/MRCP at [facility] on 01/09/14 @ 3:00 pm (this date per his request). I have advised him to arrive at [facility] radiology at 2:45 pm. He is to have nothing to eat or drink after 9 am morning of his test. He verbalizes understanding and has relayed this information back to me.

## 2013-12-10 NOTE — Telephone Encounter (Signed)
I have left a message for patient to call back so I can get an idea of which dates work better for him to have MRI/MRCP.

## 2014-01-09 ENCOUNTER — Ambulatory Visit (HOSPITAL_COMMUNITY)
Admission: RE | Admit: 2014-01-09 | Discharge: 2014-01-09 | Disposition: A | Discharge status: Home / Self Care | Payer: 59 | Source: Ambulatory Visit | Attending: Gastroenterology | Admitting: Gastroenterology

## 2014-01-09 ENCOUNTER — Other Ambulatory Visit: Payer: Self-pay | Admitting: Gastroenterology

## 2014-01-09 DIAGNOSIS — K862 Cyst of pancreas: Secondary | ICD-10-CM | POA: Diagnosis not present

## 2014-01-09 LAB — POCT I-STAT CREATININE: Creatinine, Ser: 0.7 mg/dL (ref 0.50–1.35)

## 2014-01-09 MED ORDER — GADOBENATE DIMEGLUMINE 529 MG/ML IV SOLN
20.0000 mL | Freq: Once | INTRAVENOUS | Status: AC | PRN
  Administered 2014-01-09: 20 mL via INTRAVENOUS

## 2014-01-10 DIAGNOSIS — C61 Malignant neoplasm of prostate: Secondary | ICD-10-CM

## 2014-01-10 HISTORY — DX: Malignant neoplasm of prostate: C61

## 2014-02-11 ENCOUNTER — Other Ambulatory Visit: Payer: Self-pay | Admitting: Cardiology

## 2014-02-19 ENCOUNTER — Other Ambulatory Visit: Payer: Self-pay | Admitting: Cardiology

## 2014-03-13 ENCOUNTER — Ambulatory Visit: Payer: Self-pay | Admitting: Ophthalmology

## 2014-03-20 ENCOUNTER — Ambulatory Visit: Payer: Self-pay | Admitting: Ophthalmology

## 2014-04-25 ENCOUNTER — Emergency Department: Admit: 2014-04-25 | Disposition: A | Discharge status: Home / Self Care | Payer: Self-pay | Admitting: Student

## 2014-04-25 LAB — ETHANOL: ETHANOL LVL: 180 mg/dL

## 2014-05-11 NOTE — Op Note (Signed)
PATIENT NAME:  [name], Peter MR#:  [id number] DATE OF BIRTH:  Oct 22, 1955  DATE OF PROCEDURE:  03/20/2014  PREOPERATIVE DIAGNOSIS:  Nuclear sclerotic cataract, left eye.  POSTOPERATIVE DIAGNOSIS:  Nuclear sclerotic cataract, left eye.  PROCEDURE:  Phacoemulsification with posterior chamber intraocular lens left eye, model SN60WF  SURGEON:  Lyla Glassing, MD  INDICATIONS:  This is a 59 year old male with decreased vision in the left eye.  PROCEDURE:  The risks and benefits of cataract surgery were discussed at length with the patient, including bleeding, infection, retinal detachment, re-operation, diplopia, ptosis, loss of vision, and loss of the eye. Informed consent was obtained. On the day of surgery, several sets of preoperative medication were administered to the operative eye including 0.5% tetracaine,1% cyclopentolate, 10% phenylephrine, 0.5% ketorolac, 0.5% gatifloxacin, and 2% lidocaine .  The patient was taken to the operating room and sedated via IV sedation. Topical tetracaine was placed in the eye. The operative eye was prepped using a diluted 10% Betadine solution and then covered in sterile drapes leaving only the operative eye exposed. A Lieberman lid speculum was placed to provide exposure. Using 0.12 forceps and a side-port blade, a paracentesis was created. Then a mixture of BSS, preservative free lidocaine, and epinephrine was injected into the anterior chamber. Next, a 2.4 mm keratome blade was used to create a two-step full-thickness clear corneal incision temporally. The cystitome and Utrata forceps were used to create a continuous capsulorrhexis in the anterior lens capsule. BSS on a hydrodissection cannula was used to perform gentle hydrodissection. Phacoemulsification was then performed to remove the nucleus. Irrigation and aspiration was performed to remove the remaining cortical material. Provisc was injected to fill the capsular bag and anterior chamber. A 19.5 diopter  [id number] intraocular lens was injected into the capsular bag. The Connor wand was used to rotate it into proper position in the capsular bag. Irrigation and aspiration was performed to remove the remaining Viscoelastic material from the eye. BSS on a 30-gauge cannula was used to hydrate the wound. An intracameral antibiotic was administered. The wounds were checked and found to be watertight. The lid speculum and drapes were carefully removed. Several drops of Vigamox were placed in the operative eye. The eye was covered with protective eyewear. The patient was taken to the recovery area in good condition. There were no complications.   ____________________________ Lyla Glassing, MD nm:at D: 03/20/2014 09:38:30 ET T: 03/20/2014 17:58:40 ET JOB#: [id number]  cc: Lyla Glassing, MD, <Dictator> Lyla Glassing MD ELECTRONICALLY SIGNED 03/24/2014 11:21

## 2014-05-13 ENCOUNTER — Other Ambulatory Visit: Payer: Self-pay | Admitting: Gastroenterology

## 2014-05-13 ENCOUNTER — Ambulatory Visit (INDEPENDENT_AMBULATORY_CARE_PROVIDER_SITE_OTHER): Payer: 59 | Admitting: Gastroenterology

## 2014-05-13 DIAGNOSIS — Z23 Encounter for immunization: Secondary | ICD-10-CM

## 2014-05-31 IMAGING — CT CT ABD-PELV W/ CM
2 of 5 series · 16 of 46 positions shown, 18 images · IV contrast (isovue)
Comparison: None

CLINICAL DATA: Abdominal pain and bloating, loss of appetite
cirrhosis, ascites

EXAM:
CT ABDOMEN AND PELVIS WITH CONTRAST
TECHNIQUE: Multidetector CT imaging of the abdomen and pelvis was performed
using the standard protocol following bolus administration of
intravenous contrast. Sagittal and coronal MPR images reconstructed
from axial data set.
CONTRAST:  Dilute oral contrast.  100 cc Isovue 370 IV

[Series 2: routine with · axial · 0.84mm/px · z∈[-1093,-663]mm · 13 of 98 slices shown, 15 images]
[im 6/98  soft-tissue]
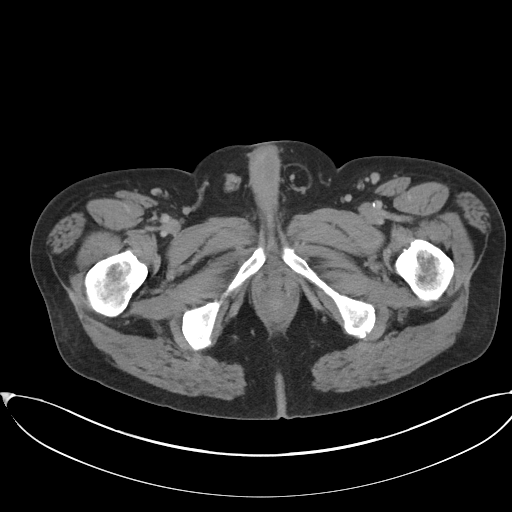
[im 6/98  bone]
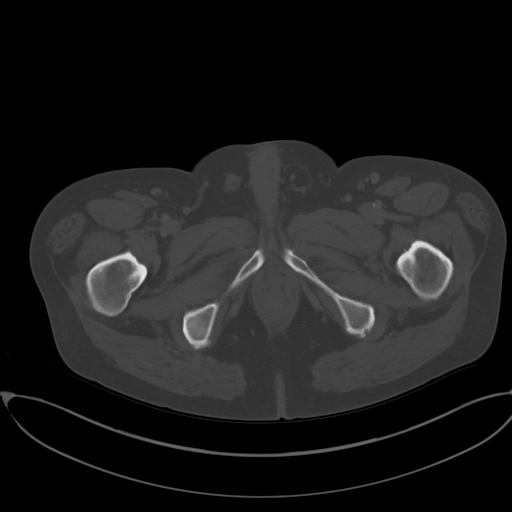
[im 16/98  soft-tissue]
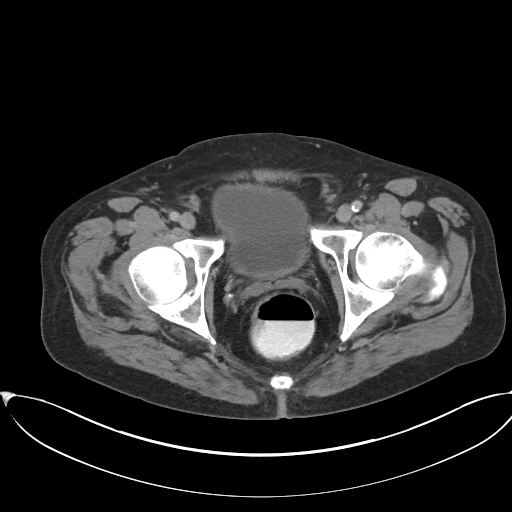
[im 21/98  soft-tissue]
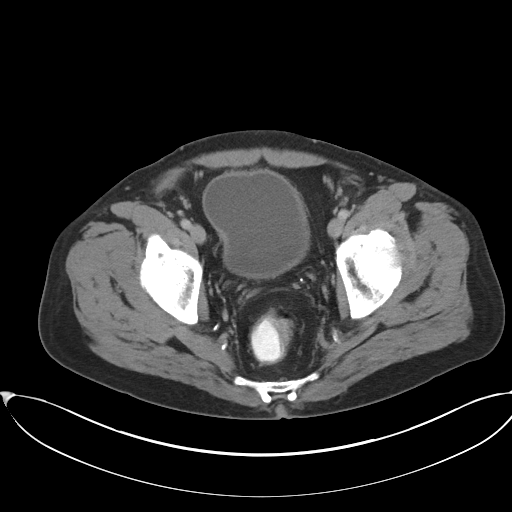
[im 26/98  soft-tissue]
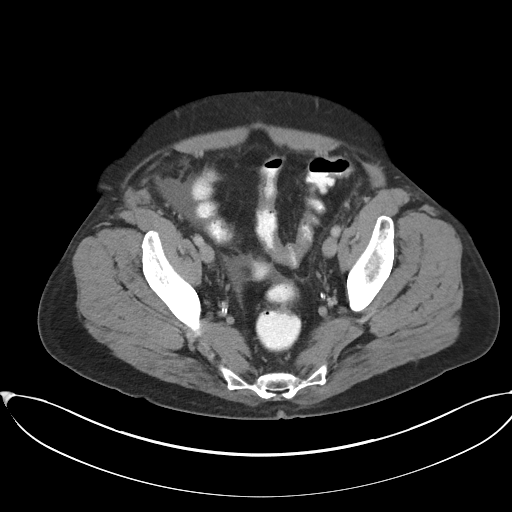
[im 36/98  soft-tissue]
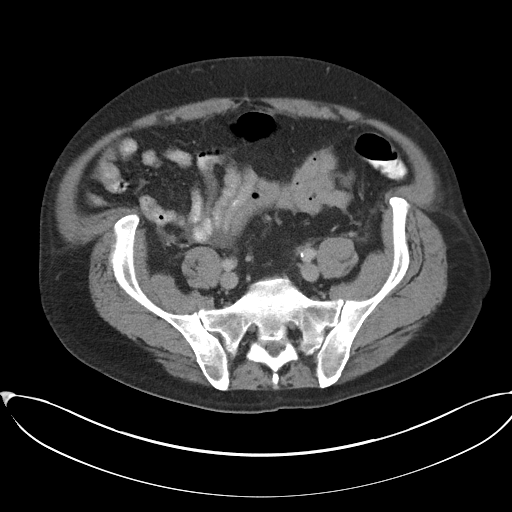
[im 41/98  soft-tissue]
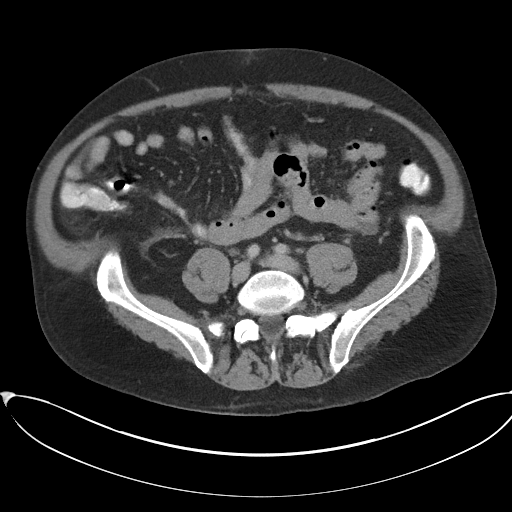
[im 52/98  soft-tissue]
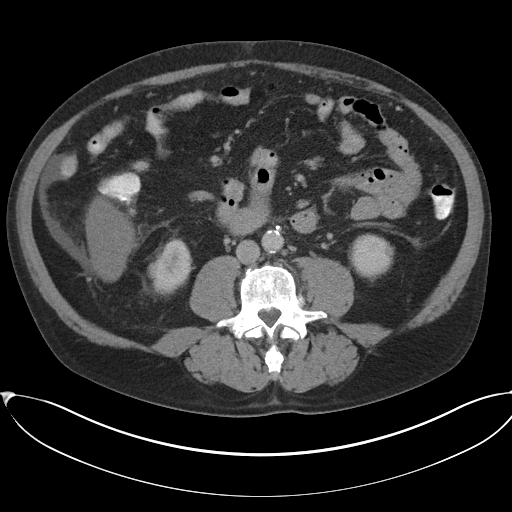
[im 57/98  soft-tissue]
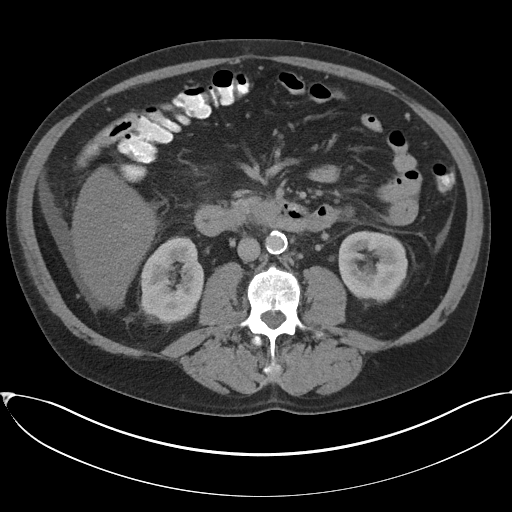
[im 62/98  soft-tissue]
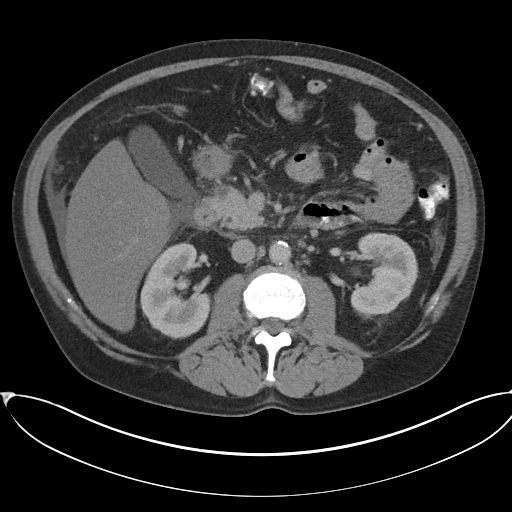
[im 62/98  bone]
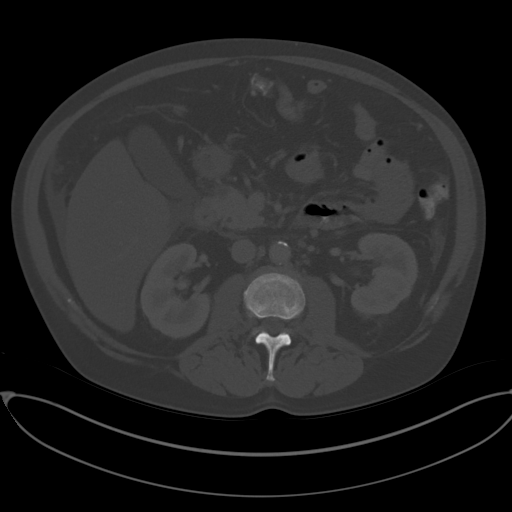
[im 72/98  soft-tissue]
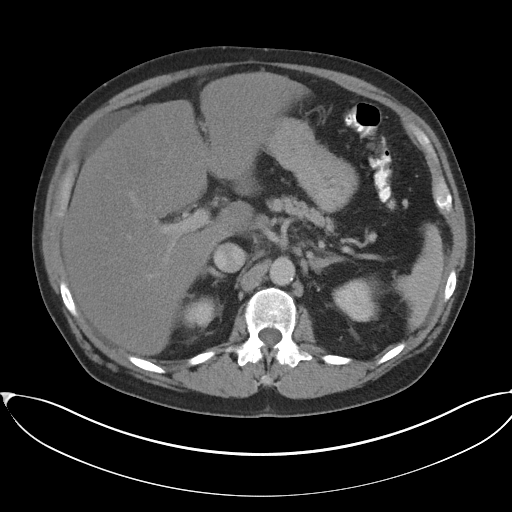
[im 77/98  soft-tissue]
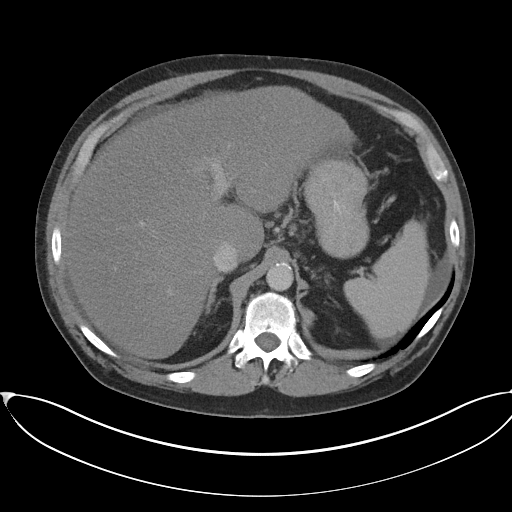
[im 82/98  soft-tissue]
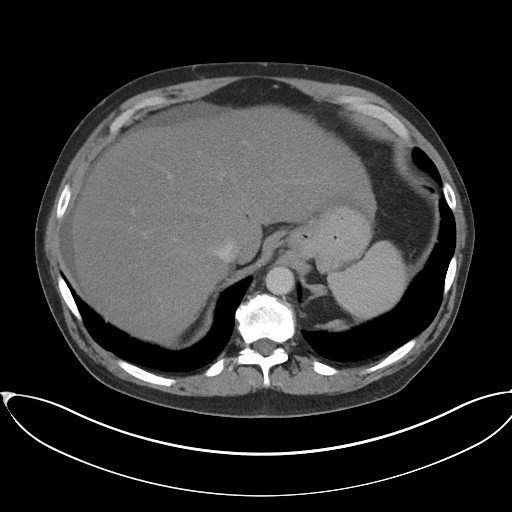
[im 92/98  soft-tissue]
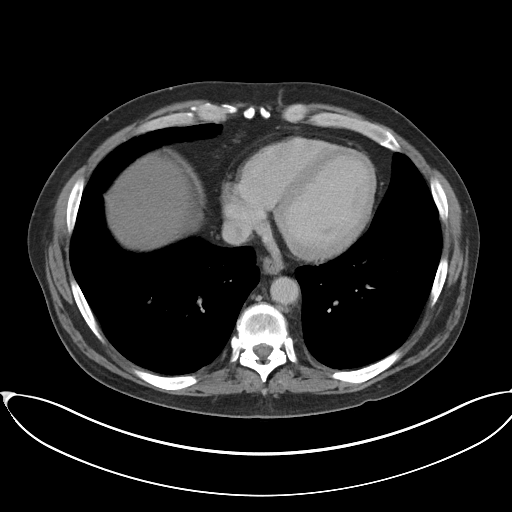

[Series 6: cor routine with · coronal · 0.84mm/px · 3 of 164 slices shown]
[im 55/164  soft-tissue]
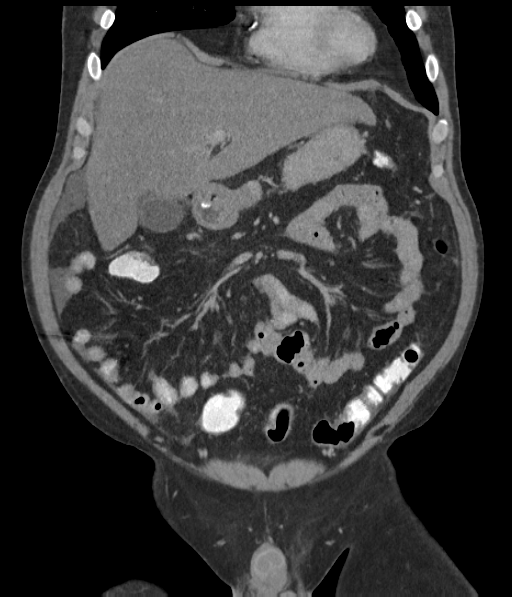
[im 73/164  soft-tissue]
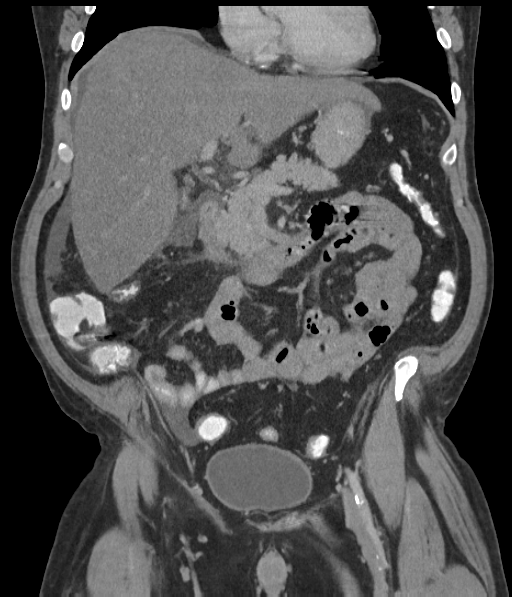
[im 91/164  soft-tissue]
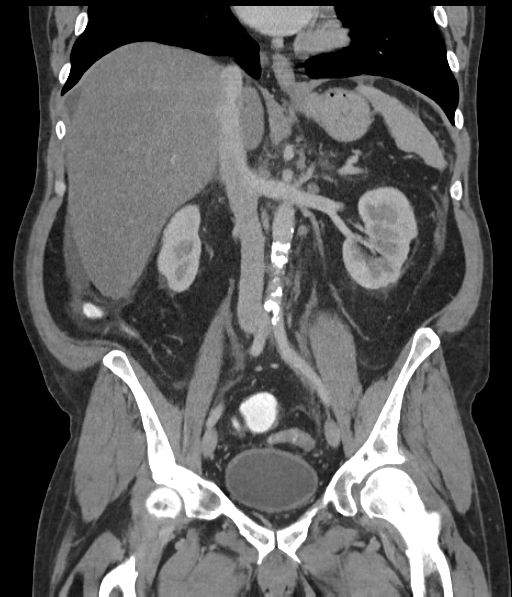

[16 of 46 positions shown; findings below may reference images not displayed]

FINDINGS: Lung bases clear.

Small posterior medial left diaphragmatic defect containing fat.

Fatty infiltration of liver.

No definite hepatic mass or nodularity.

Minimally prominent peripancreatic lymph node lobe mm short axis
image 29.

Spleen, pancreas, kidneys and adrenal glands otherwise normal
appearance.

Appendix not definitely visualized.

Small amounts of ascites are present perisplenic, perihepatic, in
pericolic gutters, and in pelvis.

Scattered stranding of retroperitoneal and mesenteric tissue planes
throughout abdomen and pelvis, question related ascites or
hyperproteinemia

Sigmoid colon and stomach incompletely distended unable to exclude
wall thickening.

Bowel loops otherwise normal appearance.

Question small bilateral fat containing inguinal hernias.

Minimal diffuse bladder wall thickening.

No mass, adenopathy, or free air.

Scattered atherosclerotic calcification.

Mild scattered degenerative disc disease changes thoracolumbar
spine.
IMPRESSION: Fatty infiltration of liver without discrete mass or nodularity.

Scattered ascites.

Cystic lesion at pancreatic head 13 x 10 x 13 mm; as patient has
upper abdominal symptoms, recommend characterization of this lesion
by MR imaging with and without contrast.

Unable to exclude wall thickening at the stomach ; if patient has
persistent symptoms consider EGD or upper GI exam.

## 2014-06-25 ENCOUNTER — Telehealth: Payer: Self-pay | Admitting: Cardiology

## 2014-06-25 NOTE — Telephone Encounter (Signed)
Pt c/o of Chest Pain: STAT if CP now or developed within 24 hours  1. Are you having CP right now? Not right. Every hour could be indigestion he is not sure.   2. Are you experiencing any other symptoms (ex. SOB, nausea, vomiting, sweating)? No   3. How long have you been experiencing CP? Started last night (Within 24 hours) every hour a little pain  4. Is your CP continuous or coming and going? Coming a going  5. Have you taken Nitroglycerin? No  ?

## 2014-06-25 NOTE — Telephone Encounter (Signed)
Patient reporting that today he has been having mid-sternal chest discomfort that is intermittent (recurs about each hour) and last for approximately 10 seconds. Denies SOB, radiating pain, diaphoresis, dizziness, reflux, coughing or arm pain. States he ate lasagna last night late prior to bedtime. He also forgot to take his Prilosec. He has being treated by GI specialist for ?cirrhosis. Patient had eye scan done today and upon returning home he took two ASA, his antacid and his Prilosec. He states the chest discomfort has subsided now. His BP 148/74. No other complaints, however he is still anxious regarding the occurrence. Offered to call him back this afternoon to check status.   Called patient back at 4:40 pm. He states all discomfort has subsided and he has no further sx. States he will remember to take his Prilosec and antacid on time. Scheduled upcoming follow up appt (last seen May, 2015). Patient will follow up with GI MD for further GI sx/problems. States he is appreciative for the call back today and "I feel fine now. I just have to remember to take my medicines".

## 2014-06-26 ENCOUNTER — Encounter: Payer: Self-pay | Admitting: Gastroenterology

## 2014-07-01 ENCOUNTER — Encounter: Payer: Self-pay | Admitting: *Deleted

## 2014-07-01 DIAGNOSIS — M1990 Unspecified osteoarthritis, unspecified site: Secondary | ICD-10-CM | POA: Diagnosis not present

## 2014-07-01 DIAGNOSIS — I10 Essential (primary) hypertension: Secondary | ICD-10-CM | POA: Diagnosis not present

## 2014-07-01 DIAGNOSIS — R011 Cardiac murmur, unspecified: Secondary | ICD-10-CM | POA: Diagnosis not present

## 2014-07-01 DIAGNOSIS — Z8546 Personal history of malignant neoplasm of prostate: Secondary | ICD-10-CM | POA: Diagnosis not present

## 2014-07-01 DIAGNOSIS — M109 Gout, unspecified: Secondary | ICD-10-CM | POA: Diagnosis not present

## 2014-07-01 DIAGNOSIS — H2511 Age-related nuclear cataract, right eye: Secondary | ICD-10-CM | POA: Diagnosis not present

## 2014-07-01 DIAGNOSIS — Z9849 Cataract extraction status, unspecified eye: Secondary | ICD-10-CM | POA: Diagnosis not present

## 2014-07-01 DIAGNOSIS — F329 Major depressive disorder, single episode, unspecified: Secondary | ICD-10-CM | POA: Diagnosis not present

## 2014-07-01 DIAGNOSIS — Z885 Allergy status to narcotic agent status: Secondary | ICD-10-CM | POA: Diagnosis not present

## 2014-07-01 DIAGNOSIS — K219 Gastro-esophageal reflux disease without esophagitis: Secondary | ICD-10-CM | POA: Diagnosis not present

## 2014-07-01 DIAGNOSIS — Z888 Allergy status to other drugs, medicaments and biological substances status: Secondary | ICD-10-CM | POA: Diagnosis not present

## 2014-07-01 DIAGNOSIS — F17200 Nicotine dependence, unspecified, uncomplicated: Secondary | ICD-10-CM | POA: Diagnosis not present

## 2014-07-01 DIAGNOSIS — Z9889 Other specified postprocedural states: Secondary | ICD-10-CM | POA: Diagnosis not present

## 2014-07-01 DIAGNOSIS — H269 Unspecified cataract: Secondary | ICD-10-CM | POA: Diagnosis present

## 2014-07-01 DIAGNOSIS — J449 Chronic obstructive pulmonary disease, unspecified: Secondary | ICD-10-CM | POA: Diagnosis not present

## 2014-07-01 DIAGNOSIS — Z79899 Other long term (current) drug therapy: Secondary | ICD-10-CM | POA: Diagnosis not present

## 2014-07-01 DIAGNOSIS — R251 Tremor, unspecified: Secondary | ICD-10-CM | POA: Diagnosis not present

## 2014-07-03 ENCOUNTER — Encounter
Admission: RE | Disposition: A | Discharge status: Home / Self Care | Payer: Self-pay | Source: Ambulatory Visit | Attending: Ophthalmology

## 2014-07-03 ENCOUNTER — Ambulatory Visit: Payer: 59 | Admitting: Anesthesiology

## 2014-07-03 ENCOUNTER — Ambulatory Visit
Admission: RE | Admit: 2014-07-03 | Discharge: 2014-07-03 | Disposition: A | Discharge status: Home / Self Care | Payer: 59 | Source: Ambulatory Visit | Attending: Ophthalmology | Admitting: Ophthalmology

## 2014-07-03 ENCOUNTER — Encounter: Payer: Self-pay | Admitting: Ophthalmology

## 2014-07-03 DIAGNOSIS — Z885 Allergy status to narcotic agent status: Secondary | ICD-10-CM | POA: Insufficient documentation

## 2014-07-03 DIAGNOSIS — K219 Gastro-esophageal reflux disease without esophagitis: Secondary | ICD-10-CM | POA: Insufficient documentation

## 2014-07-03 DIAGNOSIS — M109 Gout, unspecified: Secondary | ICD-10-CM | POA: Insufficient documentation

## 2014-07-03 DIAGNOSIS — F17200 Nicotine dependence, unspecified, uncomplicated: Secondary | ICD-10-CM | POA: Insufficient documentation

## 2014-07-03 DIAGNOSIS — I10 Essential (primary) hypertension: Secondary | ICD-10-CM | POA: Insufficient documentation

## 2014-07-03 DIAGNOSIS — Z9889 Other specified postprocedural states: Secondary | ICD-10-CM | POA: Insufficient documentation

## 2014-07-03 DIAGNOSIS — F329 Major depressive disorder, single episode, unspecified: Secondary | ICD-10-CM | POA: Insufficient documentation

## 2014-07-03 DIAGNOSIS — H2511 Age-related nuclear cataract, right eye: Secondary | ICD-10-CM | POA: Insufficient documentation

## 2014-07-03 DIAGNOSIS — J449 Chronic obstructive pulmonary disease, unspecified: Secondary | ICD-10-CM | POA: Insufficient documentation

## 2014-07-03 DIAGNOSIS — M1990 Unspecified osteoarthritis, unspecified site: Secondary | ICD-10-CM | POA: Insufficient documentation

## 2014-07-03 DIAGNOSIS — R011 Cardiac murmur, unspecified: Secondary | ICD-10-CM | POA: Insufficient documentation

## 2014-07-03 DIAGNOSIS — Z79899 Other long term (current) drug therapy: Secondary | ICD-10-CM | POA: Insufficient documentation

## 2014-07-03 DIAGNOSIS — Z8546 Personal history of malignant neoplasm of prostate: Secondary | ICD-10-CM | POA: Insufficient documentation

## 2014-07-03 DIAGNOSIS — R251 Tremor, unspecified: Secondary | ICD-10-CM | POA: Insufficient documentation

## 2014-07-03 DIAGNOSIS — Z9849 Cataract extraction status, unspecified eye: Secondary | ICD-10-CM | POA: Insufficient documentation

## 2014-07-03 DIAGNOSIS — Z888 Allergy status to other drugs, medicaments and biological substances status: Secondary | ICD-10-CM | POA: Insufficient documentation

## 2014-07-03 HISTORY — DX: Gout, unspecified: M10.9

## 2014-07-03 HISTORY — PX: CATARACT EXTRACTION W/PHACO: SHX586

## 2014-07-03 SURGERY — PHACOEMULSIFICATION, CATARACT, WITH IOL INSERTION
Anesthesia: Monitor Anesthesia Care | Site: Eye | Laterality: Right | Wound class: Clean

## 2014-07-03 MED ORDER — TETRACAINE HCL 0.5 % OP SOLN
1.0000 [drp] | OPHTHALMIC | Status: DC | PRN
  Administered 2014-07-03: 1 [drp] via OPHTHALMIC

## 2014-07-03 MED ORDER — MOXIFLOXACIN HCL 0.5 % OP SOLN
1.0000 [drp] | OPHTHALMIC | Status: AC
  Administered 2014-07-03 (×3): 1 [drp] via OPHTHALMIC

## 2014-07-03 MED ORDER — CEFUROXIME OPHTHALMIC INJECTION 1 MG/0.1 ML
INJECTION | OPHTHALMIC | Status: DC | PRN
  Administered 2014-07-03: 0.1 mL via INTRACAMERAL

## 2014-07-03 MED ORDER — CEFUROXIME OPHTHALMIC INJECTION 1 MG/0.1 ML
INJECTION | OPHTHALMIC | Status: AC
  Filled 2014-07-03: qty 0.1

## 2014-07-03 MED ORDER — PHENYLEPHRINE HCL 10 % OP SOLN
OPHTHALMIC | Status: AC
  Administered 2014-07-03: 1 [drp] via OPHTHALMIC
  Filled 2014-07-03: qty 5

## 2014-07-03 MED ORDER — TETRACAINE HCL 0.5 % OP SOLN
OPHTHALMIC | Status: AC
  Administered 2014-07-03: 1 [drp] via OPHTHALMIC
  Filled 2014-07-03: qty 2

## 2014-07-03 MED ORDER — MIDAZOLAM HCL 2 MG/2ML IJ SOLN
INTRAMUSCULAR | Status: DC | PRN
  Administered 2014-07-03: 2 mg via INTRAVENOUS

## 2014-07-03 MED ORDER — PHENYLEPHRINE HCL 10 % OP SOLN
1.0000 [drp] | OPHTHALMIC | Status: AC
  Administered 2014-07-03 (×4): 1 [drp] via OPHTHALMIC

## 2014-07-03 MED ORDER — CYCLOPENTOLATE HCL 2 % OP SOLN
OPHTHALMIC | Status: AC
  Administered 2014-07-03: 1 [drp] via OPHTHALMIC
  Filled 2014-07-03: qty 2

## 2014-07-03 MED ORDER — BSS IO SOLN
INTRAOCULAR | Status: DC | PRN
Start: 2014-07-03 — End: 2014-07-03
  Administered 2014-07-03: 1

## 2014-07-03 MED ORDER — NA HYALUR & NA CHOND-NA HYALUR 0.55-0.5 ML IO KIT
PACK | INTRAOCULAR | Status: AC
  Filled 2014-07-03: qty 1.05

## 2014-07-03 MED ORDER — TRYPAN BLUE 0.06 % OP SOLN
OPHTHALMIC | Status: AC
  Filled 2014-07-03: qty 0.5

## 2014-07-03 MED ORDER — LIDOCAINE HCL (PF) 4 % IJ SOLN
INTRAMUSCULAR | Status: DC | PRN
  Administered 2014-07-03: 8.5 mL via OPHTHALMIC

## 2014-07-03 MED ORDER — EPINEPHRINE HCL 1 MG/ML IJ SOLN
INTRAMUSCULAR | Status: AC
  Filled 2014-07-03: qty 1

## 2014-07-03 MED ORDER — TETRACAINE 0.5 % OP SOLN OPTIME - NO CHARGE
OPHTHALMIC | Status: DC | PRN
Start: 2014-07-03 — End: 2014-07-03
  Administered 2014-07-03: 2 [drp] via OPHTHALMIC

## 2014-07-03 MED ORDER — NA HYALUR & NA CHOND-NA HYALUR 0.4-0.35 ML IO KIT
PACK | INTRAOCULAR | Status: DC | PRN
  Administered 2014-07-03: .35 mL via INTRAOCULAR

## 2014-07-03 MED ORDER — LIDOCAINE HCL (PF) 1 % IJ SOLN
INTRAOCULAR | Status: DC | PRN
  Administered 2014-07-03: 4 mL via OPHTHALMIC

## 2014-07-03 MED ORDER — LIDOCAINE HCL (PF) 4 % IJ SOLN
INTRAMUSCULAR | Status: AC
  Filled 2014-07-03: qty 5

## 2014-07-03 MED ORDER — MOXIFLOXACIN HCL 0.5 % OP SOLN
OPHTHALMIC | Status: DC | PRN
  Administered 2014-07-03: 1 [drp] via OPHTHALMIC

## 2014-07-03 MED ORDER — FENTANYL CITRATE (PF) 100 MCG/2ML IJ SOLN
INTRAMUSCULAR | Status: DC | PRN
  Administered 2014-07-03: 50 ug via INTRAVENOUS

## 2014-07-03 MED ORDER — SODIUM CHLORIDE 0.9 % IV SOLN
INTRAVENOUS | Status: DC
  Administered 2014-07-03 (×2): via INTRAVENOUS

## 2014-07-03 MED ORDER — MOXIFLOXACIN HCL 0.5 % OP SOLN
OPHTHALMIC | Status: AC
  Administered 2014-07-03: 1 [drp] via OPHTHALMIC
  Filled 2014-07-03: qty 3

## 2014-07-03 MED ORDER — CYCLOPENTOLATE HCL 2 % OP SOLN
1.0000 [drp] | OPHTHALMIC | Status: AC
  Administered 2014-07-03 (×4): 1 [drp] via OPHTHALMIC

## 2014-07-03 SURGICAL SUPPLY — 23 items
Acrysof IQ SN60WF 19.5 ×3 IMPLANT
CUP MEDICINE 2OZ PLAST GRAD ST (MISCELLANEOUS) ×3 IMPLANT
GLOVE BIO SURGEON STRL SZ7 (GLOVE) ×3 IMPLANT
GLOVE SURG LX 6.5 MICRO (GLOVE) ×4
GLOVE SURG LX STRL 6.5 MICRO (GLOVE) ×2 IMPLANT
GOWN STRL REUS W/ TWL LRG LVL3 (GOWN DISPOSABLE) ×2 IMPLANT
GOWN STRL REUS W/TWL LRG LVL3 (GOWN DISPOSABLE) ×6
LENS IOL ACRSF IQ PC 19.5 (Intraocular Lens) IMPLANT
LENS IOL ACRYSOF IQ POST 19.5 (Intraocular Lens) ×3 IMPLANT
NEEDLE FILTER BLUNT 18X 1/2SAF (NEEDLE) ×2
NEEDLE FILTER BLUNT 18X1 1/2 (NEEDLE) ×1 IMPLANT
PACK CATARACT (MISCELLANEOUS) ×3 IMPLANT
PACK CATARACT BRASINGTON LX (MISCELLANEOUS) ×3 IMPLANT
PACK EYE AFTER SURG (MISCELLANEOUS) ×3 IMPLANT
SOL BSS BAG (MISCELLANEOUS) ×3
SOL PREP PVP 2OZ (MISCELLANEOUS) ×3
SOLUTION BSS BAG (MISCELLANEOUS) ×1 IMPLANT
SOLUTION PREP PVP 2OZ (MISCELLANEOUS) ×1 IMPLANT
SYR 3ML LL SCALE MARK (SYRINGE) ×6 IMPLANT
SYR 5ML LL (SYRINGE) ×3 IMPLANT
SYR TB 1ML 27GX1/2 LL (SYRINGE) ×3 IMPLANT
WATER STERILE IRR 1000ML POUR (IV SOLUTION) ×3 IMPLANT
WIPE NON LINTING 3.25X3.25 (MISCELLANEOUS) ×3 IMPLANT

## 2014-07-03 NOTE — Anesthesia Postprocedure Evaluation (Signed)
  Anesthesia Post-op Note  Patient: Peter [name]  Procedure(s) Performed: Procedure(s) with comments: CATARACT EXTRACTION PHACO AND INTRAOCULAR LENS PLACEMENT (IOC) (Right) - lot # [id number] H [place]: 00:32.3 AP% 9.1 CDE: 2.92  Anesthesia type:MAC  Patient location: PACU  Post pain: Pain level controlled  Post assessment: Post-op Vital signs reviewed, Patient's Cardiovascular Status Stable, Respiratory Function Stable, Patent Airway and No signs of Nausea or vomiting  Post vital signs: Reviewed and stable  Last Vitals:  Filed Vitals:   07/03/14 0903  BP: 147/66  Pulse: 57  Temp: 36.2 C  Resp: 18    Level of consciousness: awake, alert  and patient cooperative  Complications: No apparent anesthesia complications

## 2014-07-03 NOTE — H&P (Signed)
The history and physical was faxed to the hospital. The history and physical was reviewed by me and no changes have occurred.   

## 2014-07-03 NOTE — Transfer of Care (Signed)
Immediate Anesthesia Transfer of Care Note  Patient: Peter [name]  Procedure(s) Performed: Procedure(s) with comments: CATARACT EXTRACTION PHACO AND INTRAOCULAR LENS PLACEMENT (IOC) (Right) - lot # [id number] H [place]: 00:32.3 AP% 9.1 CDE: 2.92  Patient Location: PACU  Anesthesia Type:MAC  Level of Consciousness: awake  Airway & Oxygen Therapy: Patient Spontanous Breathing  Post-op Assessment: Report given to RN  Post vital signs: Reviewed and stable  Last Vitals:  Filed Vitals:   07/03/14 0903  BP: 147/66  Pulse: 57  Temp: 36.2 C  Resp: 18    Complications: No apparent anesthesia complications

## 2014-07-03 NOTE — Op Note (Signed)
  07/03/2014  PRE-OP DIAGNOSIS: Cataract (ICD-10 H25.11) Nuclear sclerotic catarct, RIGHT EYE  Post operative diagnosis: Cataract (ICD-10 H25.11) Nuclear sclerotic cataract, RIGHT EYE  Procedure: Phacoemulsification with introcular lens [id number](17616)   SURGEON: Surgeon(s) and Role:    * Lyla Glassing, MD - Primary  ANESTHESIA: Choice   ESTIMATED BLOOD LOSS: MINIMAL  COMPLICATIONS: None  OPERATIVE DESCRIPTION:   Therapeutic options were discussed with the patient preoperatively, including a discussion of risks and benefits of surgery.  Informed consent was obtained. A dilated fundus exam was performed within 6 months.   The patient was premedicated and brought to the operating room and placed on the operating table in the supine position.  Topical tetracaine was instilled.  After adequate anesthesia, the patient was prepped and draped in the usual fashion.  A wire lid speculum was inserted and the microscope was positioned.  A sideport was used to create a paracentesis site and a mixture of preservative-free lidocaine, BSS, and epinephrine was was instilled into the anterior chamber, followed by viscoelastic.  A clear corneal incision was created using a keratome blade.  Capsulorrhexis was then performed.  In situ phacoemulsification was performed.  Cortical material was removed with the irrigation-aspiration unit.  Viscoelastic was instilled to open the capsular bag.  A posterior chamber intraocular lens, model 19.5 diopters, was inserted and positioned.  Irrigation-aspiration was used to remove all viscoelastic. Intracameral cefuroxime was injected into the eye. Wounds were checked for leakage and confirmed to be secure.  Lid speculum was removed and a shield was placed over the eye.  Patient was returned to the recovery room in stable condition. IMPLANTS:   Implant Name Type Inv. Item Serial No. Manufacturer Lot No. LRB No. Used  Acrysof IQ [id number] 19.5     [id number]     Right 1      Postoperative care and discharge medication counseling was discussed with the patient or the parents prior to discharge

## 2014-07-03 NOTE — Discharge Instructions (Signed)
POST OPERATIVE INSTRUCTIONS  °DAY OF CATARACT SURGERY °Your surgery went well.  °Today, take it easy and protect the eye.  °Don’t bend over at the waist. °Don’t lift objects heavier than a jug of milk (10lbs). °Don’t let anything get in the eye other than the drops we give you. °Keep the eye shield on at all times except to put in the drops. °You may have a mild headache, soreness or scratchy sensation after surgery. °EYE DROPS AFTER SURGERY ° °        Durezol °Steroid (SHAKE WELL) Ilevro °Anti-inflammatory Vigamox °Antibiotic °  °    2 times a day Once daily 4 times a day  °   ° Wait 5 minutes between drops  °If you have questions, call [facility] °We will see you tomorrow in the eye clinic. ° °

## 2014-07-03 NOTE — Anesthesia Preprocedure Evaluation (Signed)
Anesthesia Evaluation  Patient identified by MRN, date of birth, ID band Patient awake    Reviewed: Allergy & Precautions, NPO status , Patient's Chart, lab work & pertinent test results  History of Anesthesia Complications Negative for: history of anesthetic complications  Airway Mallampati: II       Dental   Pulmonary COPDCurrent Smoker,    + decreased breath sounds      Cardiovascular hypertension, Rhythm:Regular     Neuro/Psych Seizures -,  Anxiety    GI/Hepatic Neg liver ROS, GERD-  ,  Endo/Other  negative endocrine ROS  Renal/GU negative Renal ROS  negative genitourinary   Musculoskeletal  (+) Arthritis -, Osteoarthritis,    Abdominal Normal abdominal exam  (+)   Peds negative pediatric ROS (+)  Hematology negative hematology ROS (+)   Anesthesia Other Findings   Reproductive/Obstetrics negative OB ROS                             Anesthesia Physical Anesthesia Plan  ASA: III  Anesthesia Plan: MAC   Post-op Pain Management:    Induction: Intravenous  Airway Management Planned: Nasal Cannula  Additional Equipment:   Intra-op Plan:   Post-operative Plan:   Informed Consent: I have reviewed the patients History and Physical, chart, labs and discussed the procedure including the risks, benefits and alternatives for the proposed anesthesia with the patient or authorized representative who has indicated his/her understanding and acceptance.     Plan Discussed with: CRNA  Anesthesia Plan Comments:         Anesthesia Quick Evaluation

## 2014-07-03 NOTE — Anesthesia Procedure Notes (Signed)
Procedure Name: MAC Date/Time: 07/03/2014 8:32 AM Performed by: Allean Found Pre-anesthesia Checklist: Emergency Drugs available, Patient identified, Suction available, Patient being monitored and Timeout performed Oxygen Delivery Method: Nasal cannula

## 2014-07-30 ENCOUNTER — Ambulatory Visit (INDEPENDENT_AMBULATORY_CARE_PROVIDER_SITE_OTHER): Payer: 59 | Admitting: Nurse Practitioner

## 2014-07-30 ENCOUNTER — Encounter: Payer: Self-pay | Admitting: Nurse Practitioner

## 2014-07-30 VITALS — BP 136/80 | HR 57 | Ht 73.0 in | Wt 238.1 lb

## 2014-07-30 DIAGNOSIS — E785 Hyperlipidemia, unspecified: Secondary | ICD-10-CM

## 2014-07-30 DIAGNOSIS — Z720 Tobacco use: Secondary | ICD-10-CM

## 2014-07-30 DIAGNOSIS — R011 Cardiac murmur, unspecified: Secondary | ICD-10-CM

## 2014-07-30 DIAGNOSIS — I10 Essential (primary) hypertension: Secondary | ICD-10-CM | POA: Diagnosis not present

## 2014-07-30 DIAGNOSIS — R010 Benign and innocent cardiac murmurs: Secondary | ICD-10-CM | POA: Diagnosis not present

## 2014-07-30 DIAGNOSIS — R079 Chest pain, unspecified: Secondary | ICD-10-CM | POA: Diagnosis not present

## 2014-07-30 MED ORDER — LISINOPRIL 10 MG PO TABS: 10.0000 mg | ORAL_TABLET | Freq: Every day | ORAL | Status: DC

## 2014-07-30 MED ORDER — ASPIRIN EC 81 MG PO TBEC: 81.0000 mg | DELAYED_RELEASE_TABLET | Freq: Every day | ORAL | Status: DC

## 2014-07-30 MED ORDER — NEBIVOLOL HCL 5 MG PO TABS: 5.0000 mg | ORAL_TABLET | Freq: Every day | ORAL | Status: DC

## 2014-07-30 NOTE — Patient Instructions (Addendum)
We will be checking the following labs today - NONE   Medication Instructions:    Continue with your current medicines.   Take your medicines every day as prescribed  Take a baby aspirin each day    Testing/Procedures To Be Arranged:  N/A  Follow-Up:   See Dr. Aundra Dubin in 6 months    Other Special Instructions:   Total smoking cessation and alcohol cessation encouraged.  Monitor your blood pressure with your new cuff. Do not use a wrist cuff.   Call the [place] office at [phone] if you have any questions, problems or concerns.

## 2014-07-30 NOTE — Progress Notes (Signed)
CARDIOLOGY OFFICE NOTE  Date:  07/30/2014    Peter [name] Date of Birth: [birth year] Medical Record #[id number]  PCP:  Morton Peters, MD  Cardiologist:  Aundra Dubin    Chief Complaint  Patient presents with  . FU for multiple CV risk factors.    Seen for Dr. Aundra Dubin.     History of Present Illness: Peter [name] is a 59 y.o. male who presents today for a 14 month check. Seen for Dr. Aundra Dubin. He has a history of HTN, hyperlipidemia, and PVCs.   Last seen in May of 2015 with rising weight and BP. Off statin due to liver issues.   He has a history of heavy ETOH intake. He had cut back  He was still smoking 1 pack/week. Not getting much exercise.  Last echo in 4/15 showed EF 60-65% with no significant abnormalities.    Past Medical History:  1. HTN  2. Echo (12/11): EF 60-65%, mild LVH, no regional WMAs, grade I diastolic dysfunction, mild LAE. Echo (4/15) with EF 16-83%, grade I diastolic dysfunction.  3. Anxiety  4. Palpitations: Holter monitor 2004 with occasional PVCs. Holter (12/11) with PACs and PVCs (not common).  5. Nuclear stress test > 10 years ago: normal per patient.  6. Active smoker 7. Hyperlipidemia. Myalgias with Lipitor. Weakness with Crestor 20.  8. GERD with history of esophageal stricture and dilation.  9. Ulnar neuropathy 10. Fatty liver, ?cirrhosis: has history of heavy ETOH.  11. H/o seizure 12. Prostate cancer - treated with radioactive seed implant in 2016  Comes in today. Here alone. Says he is "falling apart". Has had prostate CA - earlier this year - treated with radiation. Says this has given him the "shakes" and he now has to take Librium. Complains of a "sharp" chest pain with doing his chores at home - not with walking but really not exercising. Not short of breath. Drinking less alcohol. Continues to smoke. Some "flutters" occasionally. Says he has a bulging disk - got an MRI use to get injections.  Limited by knee pain. Not  on statin - not interested in taking. Told my CMA that he did not take his medicines regularly - would stop for about 3 days and then note that BP would go "way high".    Past Medical History  Diagnosis Date  . Hypertension   . Anxiety   . Palpitations   . Hyperlipidemia   . Internal hemorrhoids   . Tubular adenoma of colon 03/2008  . Arthritis   . Murmur, cardiac   . Esophageal stricture   . Lesion of ulnar nerve 09/03/2012    Right ulnar neuropathy  . Abnormality of gait 09/03/2012  . Alcohol abuse   . Cirrhosis   . Ascites   . GERD (gastroesophageal reflux disease)   . Seizures     pt states he had seizure 12/14  . Acute confusional state 03/25/2013  . Tremors of nervous system   . Gout   . Cancer     prostate  . Edema     feet  . Prostate cancer 2016    treated with radioactive seed implant    Past Surgical History  Procedure Laterality Date  . Hand surgery Left   . Circumcision    . Colonoscopy    . Elbow surgery    . Shoulder surgery    . Eye surgery    . Cataract extraction w/phaco Right 07/03/2014    Procedure: CATARACT  EXTRACTION PHACO AND INTRAOCULAR LENS PLACEMENT (IOC);  Surgeon: Lyla Glassing, MD;  Location: [facility] ORS;  Service: Ophthalmology;  Laterality: Right;  lot # [id number] H [place]: 00:32.3 AP% 9.1 CDE: 2.92     Medications: Current Outpatient Prescriptions  Medication Sig Dispense Refill  . busPIRone (BUSPAR) 30 MG tablet Take 30 mg by mouth daily.    . chlordiazePOXIDE (LIBRIUM) 25 MG capsule Take 25 mg by mouth 3 (three) times daily as needed for anxiety.    . colchicine 0.6 MG tablet Take 0.6 mg by mouth as needed (gout). For gout flare ups    . DUREZOL 0.05 % EMUL Place 0.05 drops into both eyes 2 (two) times daily.     . hydrOXYzine (ATARAX/VISTARIL) 50 MG tablet Take 50 mg by mouth at bedtime.    . Hyprom-Naphaz-Polysorb-Zn Sulf (CLEAR EYES COMPLETE OP) Apply 2 drops to eye daily as needed (dryness).    . ILEVRO 0.3 % ophthalmic suspension  Place 1 drop into both eyes daily.     Marland Kitchen lisinopril (PRINIVIL,ZESTRIL) 10 MG tablet Take 1 tablet (10 mg total) by mouth daily. 90 tablet 3  . milk thistle 175 MG tablet Take 350 mg by mouth daily.     Marland Kitchen moxifloxacin (VIGAMOX) 0.5 % ophthalmic solution 1 drop 3 (three) times daily.    . Multiple Vitamin (MULTIVITAMIN) tablet Take 1 tablet by mouth daily. Centrum Plus -Take 1 daily    . nebivolol (BYSTOLIC) 5 MG tablet Take 1 tablet (5 mg total) by mouth daily. 90 tablet 3  . NON FORMULARY Take 10 mLs by mouth as needed (gout flare). Black cherry extract ( concentrate)    . Omega-3 Fatty Acids (FISH OIL PO) Take 2 capsules by mouth every morning.    . pantoprazole (PROTONIX) 40 MG tablet TAKE 1 TABLET (40 MG TOTAL) BY MOUTH DAILY. (Patient taking differently: TAKE 1 TABLET (40 MG TOTAL) BY MOUTH DAILY PRN HEARTBURN) 30 tablet 0  . tamsulosin (FLOMAX) 0.4 MG CAPS capsule Take 0.4 mg by mouth daily.     . VOLTAREN 1 % GEL Apply 2 g topically as needed (knees).     Marland Kitchen aspirin EC 81 MG tablet Take 1 tablet (81 mg total) by mouth daily. 90 tablet 3   No current facility-administered medications for this visit.    Allergies: Allergies  Allergen Reactions  . Crestor [Rosuvastatin] Other (See Comments)    Bloating, swelling of legs  . Hydrocodone Bitartrate Er     Social History: The patient  reports that he has been smoking Cigarettes.  He has a 12 pack-year smoking history. He has never used smokeless tobacco. He reports that he drinks about 7.2 oz of alcohol per week. He reports that he uses illicit drugs (Marijuana).   Family History: The patient's family history includes Cancer in his father and mother; Diabetes in his father; Hyperlipidemia in his father; Hypertension in his father and mother; Kidney disease in his father; Lung cancer in his father and mother. There is no history of Colon cancer, Pancreatic cancer, or Stomach cancer.   Review of Systems: Please see the history of present  illness.   Otherwise, the review of systems is positive for appetite change, chest pain, leg swelling, DOE, back pain, easy bruising, leg pain, snoring, anxiety, difficulty urinating, and balance issues.   All other systems are reviewed and negative.   Physical Exam: VS:  BP 136/80 mmHg  Pulse 57  Ht 6\' 1"  (1.854 m)  Wt 238 lb 1.9 oz (  108.011 kg)  BMI 31.42 kg/m2 .  BMI Body mass index is 31.42 kg/(m^2).  Wt Readings from Last 3 Encounters:  07/30/14 238 lb 1.9 oz (108.011 kg)  07/01/14 240 lb (108.863 kg)  05/30/13 234 lb (106.142 kg)    General: Pleasant. Well developed, well nourished and in no acute distress.  HEENT: Normal. Neck: Supple, no JVD, carotid bruits, or masses noted.  Cardiac: Regular rate and rhythm. Soft outflow murmur. No edema.  Respiratory:  Lungs are clear to auscultation bilaterally with normal work of breathing.  GI: Soft and nontender.  MS: No deformity or atrophy. Gait and ROM intact. Skin: Warm and dry. Color is normal.  Neuro:  Strength and sensation are intact and no gross focal deficits noted.  Psych: Alert, appropriate and with normal affect.   LABORATORY DATA:  EKG:  EKG is ordered today. This demonstrates sinus brady.  Lab Results  Component Value Date   WBC 11.9* 12/22/2012   HGB 14.2 12/22/2012   HCT 41.6 12/22/2012   PLT 167 12/22/2012   GLUCOSE 99 05/10/2013   ALT 28 12/22/2012   AST 57* 12/22/2012   NA 137 05/10/2013   K 5.0 05/10/2013   CL 97 05/10/2013   CREATININE 0.70 01/09/2014   BUN 12 05/10/2013   CO2 20 05/10/2013   TSH 0.959 09/03/2012   INR 1.1 11/23/2012    BNP (last 3 results) No results for input(s): BNP in the last 8760 hours.  ProBNP (last 3 results) No results for input(s): PROBNP in the last 8760 hours.   Other Studies Reviewed Today:  Echo Study Conclusions from 04/2013  - Left ventricle: The cavity size was normal. There was mild concentric hypertrophy. Systolic function was normal.  The estimated ejection fraction was in the range of 60% to 65%. Wall motion was normal; there were no regional wall motion abnormalities. Doppler parameters are consistent with abnormal left ventricular relaxation (grade 1 diastolic dysfunction). The E/e' ratio is >10, suggesting elevated LV filling pressure. - Aortic valve: Trileaflet. Sclerosis without stenosis. Transvalvular velocity was minimally increased. There was no stenosis. No regurgitation. - Left atrium: The atrium was at the upper limits of normal in size. - Systemic veins: Not visualized. - Pericardium, extracardiac: There was no pericardial effusion.  Assessment/Plan: 1. HTN - BP ok on his current regimen - he needs to take his medicines regularly.   2. Hyperlipidemia: LDL has been very high in the past. He was on Crestor 5 mg daily but stopped it because of liver issues (fatty liver, ?cirrhosis). We have thought that this is likely due to ETOH and not affected by the statin. He could probably restart the low dose Crestor though he is not interested in doing that right now.   3. Chest pain - multiple CV risk factors - will arrange for Lexiscan - he is not able to walk on treadmill. Needs to start aspirin 81 mg - this has been recommend before.   4. Smoking: I strongly encouraged him to stop smoking  5. Alcohol use - again - encouraged to stop.   Current medicines are reviewed with the patient today.  The patient does not have concerns regarding medicines other than what has been noted above.  The following changes have been made:  See above.  Labs/ tests ordered today include:    Orders Placed This Encounter  Procedures  . Myocardial Perfusion Imaging  . EKG 12-Lead     Disposition:   FU with Dr. Aundra Dubin in 6  months.   Patient is agreeable to this plan and will call if any problems develop in the interim.   Signed: Burtis Junes, RN, ANP-C 07/30/2014 10:00 AM  [facility] [address] [place], [place]  [address] Phone: [phone] Fax: [phone]

## 2014-08-04 ENCOUNTER — Telehealth (HOSPITAL_COMMUNITY): Payer: Self-pay

## 2014-08-04 NOTE — Telephone Encounter (Signed)
erronous encounter

## 2014-08-04 NOTE — Telephone Encounter (Signed)
Patient given detailed instructions per Myocardial Perfusion Study Information Sheet for test on 08-07-2014 at 0900. Patient Notified to arrive 15 minutes early, and that it is imperative to arrive on time for appointment to keep from having the test rescheduled. Patient verbalized understanding. Oletta Lamas, Rafaelita Foister A

## 2014-08-07 ENCOUNTER — Encounter: Payer: Self-pay | Admitting: Cardiology

## 2014-08-07 ENCOUNTER — Ambulatory Visit (HOSPITAL_COMMUNITY): Payer: 59 | Attending: Cardiology

## 2014-08-07 DIAGNOSIS — Z720 Tobacco use: Secondary | ICD-10-CM

## 2014-08-07 DIAGNOSIS — R010 Benign and innocent cardiac murmurs: Secondary | ICD-10-CM

## 2014-08-07 DIAGNOSIS — F17200 Nicotine dependence, unspecified, uncomplicated: Secondary | ICD-10-CM | POA: Insufficient documentation

## 2014-08-07 DIAGNOSIS — E785 Hyperlipidemia, unspecified: Secondary | ICD-10-CM | POA: Diagnosis not present

## 2014-08-07 DIAGNOSIS — R079 Chest pain, unspecified: Secondary | ICD-10-CM | POA: Diagnosis not present

## 2014-08-07 DIAGNOSIS — R9439 Abnormal result of other cardiovascular function study: Secondary | ICD-10-CM | POA: Insufficient documentation

## 2014-08-07 DIAGNOSIS — I10 Essential (primary) hypertension: Secondary | ICD-10-CM | POA: Insufficient documentation

## 2014-08-07 DIAGNOSIS — R011 Cardiac murmur, unspecified: Secondary | ICD-10-CM

## 2014-08-07 LAB — MYOCARDIAL PERFUSION IMAGING
LV dias vol: 183 mL
LV sys vol: 117 mL
Peak HR: 79 {beats}/min
RATE: 0.36
Rest HR: 66 {beats}/min
SDS: 2
SRS: 4
SSS: 6
TID: 0.97

## 2014-08-07 MED ORDER — TECHNETIUM TC 99M SESTAMIBI GENERIC - CARDIOLITE
9.7000 | Freq: Once | INTRAVENOUS | Status: AC | PRN
  Administered 2014-08-07: 10 via INTRAVENOUS

## 2014-08-07 MED ORDER — REGADENOSON 0.4 MG/5ML IV SOLN
0.4000 mg | Freq: Once | INTRAVENOUS | Status: AC
  Administered 2014-08-07: 0.4 mg via INTRAVENOUS

## 2014-08-07 MED ORDER — TECHNETIUM TC 99M SESTAMIBI GENERIC - CARDIOLITE
30.4000 | Freq: Once | INTRAVENOUS | Status: AC | PRN
  Administered 2014-08-07: 30 via INTRAVENOUS

## 2014-10-01 ENCOUNTER — Other Ambulatory Visit: Payer: Self-pay | Admitting: Orthopaedic Surgery

## 2014-10-01 DIAGNOSIS — M545 Low back pain: Secondary | ICD-10-CM

## 2014-10-09 ENCOUNTER — Other Ambulatory Visit: Payer: Self-pay | Admitting: Gastroenterology

## 2014-10-11 ENCOUNTER — Ambulatory Visit
Admission: RE | Admit: 2014-10-11 | Discharge: 2014-10-11 | Disposition: A | Discharge status: Home / Self Care | Payer: 59 | Source: Ambulatory Visit | Attending: Orthopaedic Surgery | Admitting: Orthopaedic Surgery

## 2014-10-11 DIAGNOSIS — M545 Low back pain: Secondary | ICD-10-CM

## 2014-12-03 ENCOUNTER — Other Ambulatory Visit: Payer: Self-pay | Admitting: Orthopaedic Surgery

## 2014-12-03 DIAGNOSIS — M25561 Pain in right knee: Secondary | ICD-10-CM

## 2014-12-13 ENCOUNTER — Other Ambulatory Visit: Payer: 59

## 2014-12-15 ENCOUNTER — Ambulatory Visit
Admission: RE | Admit: 2014-12-15 | Discharge: 2014-12-15 | Disposition: A | Discharge status: Home / Self Care | Payer: 59 | Source: Ambulatory Visit | Attending: Orthopaedic Surgery | Admitting: Orthopaedic Surgery

## 2014-12-15 DIAGNOSIS — M25561 Pain in right knee: Secondary | ICD-10-CM

## 2014-12-31 ENCOUNTER — Telehealth: Payer: Self-pay | Admitting: Gastroenterology

## 2014-12-31 NOTE — Telephone Encounter (Signed)
Spoke with pt and let him know he had completed the Twinrix vaccine series.

## 2015-01-30 ENCOUNTER — Telehealth: Payer: Self-pay | Admitting: Cardiology

## 2015-01-30 NOTE — Telephone Encounter (Signed)
Patient wants to switch to [facility] provider as [place] office is no longer convenient .  Is this ok with Dr. Aundra Dubin?

## 2015-01-30 NOTE — Telephone Encounter (Signed)
That is fine, he can see Dr Fletcher Anon or Dr Rockey Situ.

## 2015-02-02 NOTE — Telephone Encounter (Signed)
Notified that Dr. Aundra Dubin approved of him seeing physician in [place].  Will refer to Dr. Fletcher Anon or Dr. Rockey Situ.

## 2015-02-04 ENCOUNTER — Encounter: Payer: Self-pay | Admitting: Family Medicine

## 2015-02-04 ENCOUNTER — Ambulatory Visit (INDEPENDENT_AMBULATORY_CARE_PROVIDER_SITE_OTHER): Payer: 59 | Admitting: Family Medicine

## 2015-02-04 VITALS — BP 136/84 | HR 62 | Temp 98.1°F | Ht 73.0 in | Wt 250.5 lb

## 2015-02-04 DIAGNOSIS — M79606 Pain in leg, unspecified: Secondary | ICD-10-CM | POA: Diagnosis not present

## 2015-02-04 DIAGNOSIS — Z23 Encounter for immunization: Secondary | ICD-10-CM

## 2015-02-04 DIAGNOSIS — Z720 Tobacco use: Secondary | ICD-10-CM | POA: Diagnosis not present

## 2015-02-04 DIAGNOSIS — E669 Obesity, unspecified: Secondary | ICD-10-CM | POA: Diagnosis not present

## 2015-02-04 DIAGNOSIS — K700 Alcoholic fatty liver: Secondary | ICD-10-CM | POA: Diagnosis not present

## 2015-02-04 DIAGNOSIS — I10 Essential (primary) hypertension: Secondary | ICD-10-CM | POA: Diagnosis not present

## 2015-02-04 DIAGNOSIS — Z0000 Encounter for general adult medical examination without abnormal findings: Secondary | ICD-10-CM | POA: Diagnosis not present

## 2015-02-04 DIAGNOSIS — E66811 Obesity, class 1: Secondary | ICD-10-CM

## 2015-02-04 DIAGNOSIS — F1011 Alcohol abuse, in remission: Secondary | ICD-10-CM | POA: Insufficient documentation

## 2015-02-04 DIAGNOSIS — Z8546 Personal history of malignant neoplasm of prostate: Secondary | ICD-10-CM | POA: Insufficient documentation

## 2015-02-04 NOTE — Assessment & Plan Note (Signed)
Colonoscopy up-to-date. Tetanus and flu vaccines up-to-date. Pneumovax was given today. Patient has had prior hepatitis screening. Labs today. Discussed alcohol cessation as well as tobacco cessation.

## 2015-02-04 NOTE — Assessment & Plan Note (Signed)
Initially blood pressure was elevated. Repeat was improved. Blood pressure currently at goal. Continue lisinopril and bystolic.

## 2015-02-04 NOTE — Progress Notes (Signed)
Subjective:  Patient ID: Peter [name], male    DOB: [birth year]  Age: 60 y.o. MRN: [id number]  CC: Establish care  HPI Peter [name] is a 60 y.o. male presents to establish care.  Preventative Healthcare  Colonoscopy: Up to date.  Immunizations  Tetanus - Up to date.   Pneumococcal - In need of given that he is a current smoker. Will give today.  Flu - up-to-date. Received earlier this year.  Zoster - not indicated.  Prostate cancer screening: patient has a history of prostate cancer and is followed by urology.  Hepatitis C screening - patient has had screening per notes in EMR.  Labs: in need of labs today. Obtaining CBC, CMP, lipid, A1c, TSH ([facility]).  Exercise: No.  Alcohol use: Yes. Patient drinks 4 beers a day. He has a documented history of alcohol abuse.  Smoking/tobacco use: current smoker.  Regular dental exams: yes.  Wears seat belt: yes.  Leg pain, cramps  Patient reports a 3 week history of leg pain and cramping.  Worse at night.  No known exacerbating or relieving factors.  He does have a known history of degenerative disc disease.  HTN  Well controlled per report.  Patient compliant with lisinopril and bystolic.  Denies chest pain, shortness of breath.  Tobacco abuse  Patient states that he has cut back and is smoking approximately 5 cigarettes a day.  He states that he has wife are discussing quitting.  Alcohol abuse, fatty liver  I reviewed the most recent note from Dr. Fuller Plan, gastroenterology.  His note reflects that the patient has fatty liver and possibly cirrhosis.  Patient has not followed up with GI.  He states that he enjoys drinking and does not think it is a problem.  He continues to drink daily.  PMH, Surgical Hx, Family Hx, Social History reviewed and updated as below.  Past Medical History  Diagnosis Date  . Hypertension   . Anxiety   . Palpitations   . Hyperlipidemia   . Internal hemorrhoids   .  Tubular adenoma of colon 03/2008  . Arthritis   . Murmur, cardiac   . Esophageal stricture   . Lesion of ulnar nerve 09/03/2012    Right ulnar neuropathy  . Abnormality of gait 09/03/2012  . Alcohol abuse   . GERD (gastroesophageal reflux disease)   . Seizures ([place])     pt states he had seizure 12/14  . Gout   . Prostate cancer ([place]) 2016    treated with radioactive seed implant   Past Surgical History  Procedure Laterality Date  . Hand surgery Left   . Circumcision    . Colonoscopy    . Elbow surgery    . Shoulder surgery    . Eye surgery    . Cataract extraction w/phaco Right 07/03/2014    Procedure: CATARACT EXTRACTION PHACO AND INTRAOCULAR LENS PLACEMENT (IOC);  Surgeon: Lyla Glassing, MD;  Location: [facility] ORS;  Service: Ophthalmology;  Laterality: Right;  lot # [id number] H [place]: 00:32.3 AP% 9.1 CDE: 2.92   Family History  Problem Relation Age of Onset  . Diabetes Father   . Kidney disease Father   . Lung cancer Father     smoker  . Hyperlipidemia Father   . Hypertension Father   . Lung cancer Mother     smoke  . Hypertension Mother   . Colon cancer Neg Hx   . Pancreatic cancer Neg Hx   . Stomach cancer Neg Hx  Social History  Substance Use Topics  . Smoking status: Current Every Day Smoker -- 0.50 packs/day for 24 years    Types: Cigarettes  . Smokeless tobacco: Never Used  . Alcohol Use: 7.2 oz/week    12 Cans of beer per week     Comment: 4 per day, history of alcohol abuse   Review of Systems  Genitourinary: Positive for frequency.  Musculoskeletal:       Muscle aches/cramps.  Psychiatric/Behavioral:       Sadness.  All other systems reviewed and are negative.  Objective:   Today's Vitals: BP 136/84 mmHg  Pulse 62  Temp(Src) 98.1 F (36.7 C) (Oral)  Ht 6\' 1"  (1.854 m)  Wt 250 lb 8 oz (113.626 kg)  BMI 33.06 kg/m2  SpO2 97%  Physical Exam  Constitutional: He appears well-developed. No distress.  HENT:  Head: Normocephalic and atraumatic.    Mouth/Throat: Oropharynx is clear and moist.  Eyes: Conjunctivae are normal. No scleral icterus.  Neck: Neck supple.  Cardiovascular: Normal rate and regular rhythm.   Murmur heard. Pulmonary/Chest: Effort normal and breath sounds normal. No respiratory distress. He has no wheezes. He has no rales.  Abdominal: Soft. He exhibits no distension. There is no tenderness. There is no rebound and no guarding.  No palpable hepatomegaly.  Musculoskeletal: He exhibits no edema.  Neurological: He is alert.  Skin: Skin is warm and dry.  Psychiatric: He has a normal mood and affect.  Vitals reviewed.   Assessment & Plan:   Problem List Items Addressed This Visit    Essential hypertension    Initially blood pressure was elevated. Repeat was improved. Blood pressure currently at goal. Continue lisinopril and bystolic.      Tobacco abuse    Encouraged cessation. He states that he and his wife are contemplating quitting at this time.      Alcohol induced fatty liver    I encouraged follow-up with GI. Patient is in denial about his alcoholism. He does not seem to think this is a problem. I encouraged him to cut back and then quit. Does not feel like this problem and does not appear to be motivated to do so.      Obesity (BMI 30.0-34.9)   Preventative health care - Primary    Colonoscopy up-to-date. Tetanus and flu vaccines up-to-date. Pneumovax was given today. Patient has had prior hepatitis screening. Labs today. Discussed alcohol cessation as well as tobacco cessation.      Relevant Orders   Pneumococcal polysaccharide vaccine 23-valent greater than or equal to 2yo subcutaneous/IM (Completed)   Leg pain    Likely related to venous especially versus radiculopathy. Labs today. Patient to follow-up closely with orthopedics.         Outpatient Encounter Prescriptions as of 02/04/2015  Medication Sig  . aspirin EC 81 MG tablet Take 1 tablet (81 mg total) by mouth daily.  .  chlordiazePOXIDE (LIBRIUM) 25 MG capsule Take 25 mg by mouth once.   . colchicine 0.6 MG tablet Take 0.6 mg by mouth as needed (gout). For gout flare ups  . esomeprazole (NEXIUM) 20 MG capsule Take 20 mg by mouth daily at 12 noon.  . hydrOXYzine (ATARAX/VISTARIL) 50 MG tablet Take 50 mg by mouth at bedtime. Reported on 02/04/2015  . lisinopril (PRINIVIL,ZESTRIL) 10 MG tablet Take 1 tablet (10 mg total) by mouth daily.  . milk thistle 175 MG tablet Take 350 mg by mouth daily.   . Multiple Vitamin (MULTIVITAMIN) tablet Take  1 tablet by mouth daily. Centrum Plus -Take 1 daily  . nebivolol (BYSTOLIC) 5 MG tablet Take 1 tablet (5 mg total) by mouth daily.  . tamsulosin (FLOMAX) 0.4 MG CAPS capsule Take 0.4 mg by mouth daily.   . traMADol (ULTRAM) 50 MG tablet   . triamcinolone cream (KENALOG) 0.1 %   . VOLTAREN 1 % GEL Apply 2 g topically as needed (knees).   . [DISCONTINUED] DUREZOL 0.05 % EMUL Place 0.05 drops into both eyes 2 (two) times daily.   . [DISCONTINUED] ILEVRO 0.3 % ophthalmic suspension Place 1 drop into both eyes daily.   . [DISCONTINUED] pantoprazole (PROTONIX) 40 MG tablet TAKE 1 TABLET (40 MG TOTAL) BY MOUTH DAILY. (Patient taking differently: TAKE 1 TABLET (40 MG TOTAL) BY MOUTH DAILY PRN HEARTBURN)  . [DISCONTINUED] busPIRone (BUSPAR) 30 MG tablet Take 30 mg by mouth daily. Reported on 02/04/2015  . [DISCONTINUED] Hyprom-Naphaz-Polysorb-Zn Sulf (CLEAR EYES COMPLETE OP) Apply 2 drops to eye daily as needed (dryness). Reported on 02/04/2015  . [DISCONTINUED] moxifloxacin (VIGAMOX) 0.5 % ophthalmic solution 1 drop 3 (three) times daily. Reported on 02/04/2015  . [DISCONTINUED] NON FORMULARY Take 10 mLs by mouth as needed (gout flare). Black cherry extract ( concentrate)  . [DISCONTINUED] Omega-3 Fatty Acids (FISH OIL PO) Take 2 capsules by mouth every morning.   No facility-administered encounter medications on file as of 02/04/2015.    Follow-up: Return 3-6 months, for Follow up  Chronic medical issues.  [place]

## 2015-02-04 NOTE — Assessment & Plan Note (Signed)
Encouraged cessation. He states that he and his wife are contemplating quitting at this time.

## 2015-02-04 NOTE — Progress Notes (Signed)
Pre visit review using our clinic review tool, if applicable. No additional management support is needed unless otherwise documented below in the visit note. 

## 2015-02-04 NOTE — Assessment & Plan Note (Signed)
Likely related to venous especially versus radiculopathy. Labs today. Patient to follow-up closely with orthopedics.

## 2015-02-04 NOTE — Patient Instructions (Signed)
We will call with your lab results.  Follow up in 3-6 months.  Continue your current medications.   Stop smoking and stop the alcohol as this is good for your health.  Take care  Dr. Lacinda Axon

## 2015-02-04 NOTE — Assessment & Plan Note (Signed)
I encouraged follow-up with GI. Patient is in denial about his alcoholism. He does not seem to think this is a problem. I encouraged him to cut back and then quit. Does not feel like this problem and does not appear to be motivated to do so.

## 2015-02-05 ENCOUNTER — Other Ambulatory Visit: Payer: Self-pay | Admitting: Family Medicine

## 2015-02-06 LAB — TSH: TSH: 1.77 u[IU]/mL (ref 0.450–4.500)

## 2015-02-06 LAB — COMPREHENSIVE METABOLIC PANEL
A/G RATIO: 1.2 (ref 1.1–2.5)
ALBUMIN: 4 g/dL (ref 3.5–5.5)
ALK PHOS: 98 IU/L (ref 39–117)
ALT: 29 IU/L (ref 0–44)
AST: 64 IU/L — AB (ref 0–40)
BILIRUBIN TOTAL: 2.1 mg/dL — AB (ref 0.0–1.2)
BUN / CREAT RATIO: 15 (ref 9–20)
BUN: 11 mg/dL (ref 6–24)
CO2: 19 mmol/L (ref 18–29)
CREATININE: 0.75 mg/dL — AB (ref 0.76–1.27)
Calcium: 9.3 mg/dL (ref 8.7–10.2)
Chloride: 93 mmol/L — ABNORMAL LOW (ref 96–106)
GFR calc Af Amer: 117 mL/min/{1.73_m2} (ref >59)
GFR calc non Af Amer: 101 mL/min/{1.73_m2} (ref >59)
GLOBULIN, TOTAL: 3.3 g/dL (ref 1.5–4.5)
Glucose: 120 mg/dL — ABNORMAL HIGH (ref 65–99)
POTASSIUM: 4.6 mmol/L (ref 3.5–5.2)
SODIUM: 130 mmol/L — AB (ref 134–144)
Total Protein: 7.3 g/dL (ref 6.0–8.5)

## 2015-02-06 LAB — CBC WITH DIFFERENTIAL/PLATELET
BASOS: 1 %
Basophils Absolute: 0.1 10*3/uL (ref 0.0–0.2)
EOS (ABSOLUTE): 0.3 10*3/uL (ref 0.0–0.4)
EOS: 3 %
HEMATOCRIT: 40.8 % (ref 37.5–51.0)
HEMOGLOBIN: 14.7 g/dL (ref 12.6–17.7)
Immature Grans (Abs): 0 10*3/uL (ref 0.0–0.1)
Immature Granulocytes: 0 %
LYMPHS ABS: 3.6 10*3/uL — AB (ref 0.7–3.1)
Lymphs: 36 %
MCH: 37.4 pg — AB (ref 26.6–33.0)
MCHC: 36 g/dL — AB (ref 31.5–35.7)
MCV: 104 fL — AB (ref 79–97)
MONOCYTES: 15 %
MONOS ABS: 1.5 10*3/uL — AB (ref 0.1–0.9)
NEUTROS ABS: 4.6 10*3/uL (ref 1.4–7.0)
Neutrophils: 45 %
Platelets: 134 10*3/uL — ABNORMAL LOW (ref 150–379)
RBC: 3.93 x10E6/uL — AB (ref 4.14–5.80)
RDW: 13.1 % (ref 12.3–15.4)
WBC: 10.1 10*3/uL (ref 3.4–10.8)

## 2015-02-06 LAB — LIPID PANEL W/O CHOL/HDL RATIO
CHOLESTEROL TOTAL: 235 mg/dL — AB (ref 100–199)
HDL: 38 mg/dL — ABNORMAL LOW (ref >39)
LDL CALC: 160 mg/dL — AB (ref 0–99)
TRIGLYCERIDES: 186 mg/dL — AB (ref 0–149)
VLDL Cholesterol Cal: 37 mg/dL (ref 5–40)

## 2015-02-06 LAB — HGB A1C W/O EAG: HEMOGLOBIN A1C: 5.5 % (ref 4.8–5.6)

## 2015-02-11 ENCOUNTER — Other Ambulatory Visit: Payer: Self-pay

## 2015-03-10 ENCOUNTER — Telehealth: Payer: Self-pay | Admitting: Cardiology

## 2015-03-10 NOTE — Telephone Encounter (Signed)
New message:  Pt called in stating that he will be switching offices , the [place] office is not closer to his home. He says that about 3 years ago he had Dr. Joni Fears to send a CD of imagines of his spine to this office. He would just like to know if that CD can be mailed to the [place] office. Please f/u with pt

## 2015-03-12 NOTE — Telephone Encounter (Signed)
LVM for patient we do not keep keep copies of CD's for patient these have been destroyed.

## 2015-03-20 ENCOUNTER — Encounter: Payer: Self-pay | Admitting: Cardiovascular Disease

## 2015-03-20 ENCOUNTER — Ambulatory Visit (INDEPENDENT_AMBULATORY_CARE_PROVIDER_SITE_OTHER): Payer: 59 | Admitting: Cardiovascular Disease

## 2015-03-20 VITALS — BP 180/90 | HR 70 | Ht 73.0 in | Wt 254.4 lb

## 2015-03-20 DIAGNOSIS — I700 Atherosclerosis of aorta: Secondary | ICD-10-CM | POA: Insufficient documentation

## 2015-03-20 DIAGNOSIS — I872 Venous insufficiency (chronic) (peripheral): Secondary | ICD-10-CM

## 2015-03-20 DIAGNOSIS — F1010 Alcohol abuse, uncomplicated: Secondary | ICD-10-CM

## 2015-03-20 DIAGNOSIS — Z720 Tobacco use: Secondary | ICD-10-CM

## 2015-03-20 DIAGNOSIS — I25810 Atherosclerosis of coronary artery bypass graft(s) without angina pectoris: Secondary | ICD-10-CM

## 2015-03-20 DIAGNOSIS — I10 Essential (primary) hypertension: Secondary | ICD-10-CM

## 2015-03-20 DIAGNOSIS — E785 Hyperlipidemia, unspecified: Secondary | ICD-10-CM

## 2015-03-20 DIAGNOSIS — F1011 Alcohol abuse, in remission: Secondary | ICD-10-CM

## 2015-03-20 MED ORDER — LISINOPRIL 10 MG PO TABS: 10.0000 mg | ORAL_TABLET | Freq: Two times a day (BID) | ORAL | Status: DC

## 2015-03-20 MED ORDER — NEBIVOLOL HCL 5 MG PO TABS: 5.0000 mg | ORAL_TABLET | Freq: Two times a day (BID) | ORAL | Status: DC

## 2015-03-20 NOTE — Assessment & Plan Note (Signed)
For his blood pressure we have recommended he increase the bystolic  Up to 5 mgrams twice a day, continue lisinopril 10 mg daily.  Recommended close monitoring  Of his blood pressure  if numbers continue run high, will increase lisinopril up to 10 g twice a day  suggested he call our office with numbers  hypertension likely exacerbated by alcohol intake  And obesity

## 2015-03-20 NOTE — Assessment & Plan Note (Signed)
He is not particularly eager to start a cholesterol medication given history of intolerance  recommended CT coronary calcium scoring for risk stratification  if score is very low, could pursue  Dietary changes and zetia

## 2015-03-20 NOTE — Assessment & Plan Note (Signed)
CT scan reviewed with him showing mild diffuse aortic atherosclerosis  stressed importance of smoking cessation, low cholesterol  CT coronary calcium score ordered  For risk stratification   Total encounter time more than 25 minutes  Greater than 50% was spent in counseling and coordination of care with the patient

## 2015-03-20 NOTE — Progress Notes (Signed)
Patient ID: Peter [name], male    DOB: 1955-04-21, 60 y.o.   MRN: [id number]  HPI Comments:  Peter [name] is a 60 year old gentleman  With long history of smoking, hypertension, daily alcohol drinking, prostate cancer , statin intolerance , previously seen by Dr. Algernon Huxley last in May 2015 who presents to establish care in the [facility] office for hypertension.  patient was referred for consultation by Dr. Lacinda Axon   He reports that his blood pressure has been labile.  Sometimes systolic pressure 0000000, other times 180s.  Seems to be elevated in the morning.  He does report that on higher dose beta blocker, blood pressure was dropping too low.  He has severe arthritis in his legs in the morning, seems to get better as the day goes on.  Weight has been a problem, has been trending upwards over the past several years.   cardiac testing discussed with him in detail  Previous stress test July 2016 showing no ischemia   CT scan of the abdomen pulled up in the office today showing scattered diffuse mild aortic atherosclerosis.   He does report myalgias on Lipitor, leg swelling on Crestor.   Lab work reviewed with him showing hemoglobin A1c 5.5 , total cholesterol more than 230   EKG on today's visit shows normal sinus rhythm with rate 70 bpm, ST and T wave abnormality in inferior leads   Allergies  Allergen Reactions  . Crestor [Rosuvastatin] Other (See Comments)    Bloating, swelling of legs  . Hydrocodone Bitartrate Er     Current Outpatient Prescriptions on File Prior to Visit  Medication Sig Dispense Refill  . aspirin EC 81 MG tablet Take 1 tablet (81 mg total) by mouth daily. 90 tablet 3  . chlordiazePOXIDE (LIBRIUM) 25 MG capsule Take 25 mg by mouth once.     . colchicine 0.6 MG tablet Take 0.6 mg by mouth as needed (gout). For gout flare ups    . esomeprazole (NEXIUM) 20 MG capsule Take 20 mg by mouth daily at 12 noon.    . hydrOXYzine (ATARAX/VISTARIL) 50 MG tablet Take 50 mg by  mouth at bedtime. Reported on 02/04/2015    . milk thistle 175 MG tablet Take 350 mg by mouth daily.     . Multiple Vitamin (MULTIVITAMIN) tablet Take 1 tablet by mouth daily. Centrum Plus -Take 1 daily    . tamsulosin (FLOMAX) 0.4 MG CAPS capsule Take 0.4 mg by mouth daily.     . traMADol (ULTRAM) 50 MG tablet     . triamcinolone cream (KENALOG) 0.1 %     . VOLTAREN 1 % GEL Apply 2 g topically as needed (knees).      No current facility-administered medications on file prior to visit.    Past Medical History  Diagnosis Date  . Hypertension   . Anxiety   . Palpitations   . Hyperlipidemia   . Internal hemorrhoids   . Tubular adenoma of colon 03/2008  . Arthritis   . Murmur, cardiac   . Esophageal stricture   . Lesion of ulnar nerve 09/03/2012    Right ulnar neuropathy  . Abnormality of gait 09/03/2012  . Alcohol abuse   . GERD (gastroesophageal reflux disease)   . Seizures ([place])     pt states he had seizure 12/14  . Gout   . Prostate cancer ([place]) 2016    treated with radioactive seed implant    Past Surgical History  Procedure Laterality Date  . Hand  surgery Left   . Circumcision    . Colonoscopy    . Elbow surgery    . Shoulder surgery    . Eye surgery    . Cataract extraction w/phaco Right 07/03/2014    Procedure: CATARACT EXTRACTION PHACO AND INTRAOCULAR LENS PLACEMENT (IOC);  Surgeon: Lyla Glassing, MD;  Location: [facility] ORS;  Service: Ophthalmology;  Laterality: Right;  lot # [id number] H [place]: 00:32.3 AP% 9.1 CDE: 2.92    Social History  reports that he has been smoking Cigarettes.  He has a 12 pack-year smoking history. He has never used smokeless tobacco. He reports that he drinks about 7.2 oz of alcohol per week. He reports that he uses illicit drugs (Marijuana).  Family History family history includes Diabetes in his father; Hyperlipidemia in his father; Hypertension in his father and mother; Kidney disease in his father; Lung cancer in his father and mother. There  is no history of Colon cancer, Pancreatic cancer, or Stomach cancer.   Review of Systems  Constitutional: Negative.   HENT: Negative.   Eyes: Negative.   Respiratory: Negative.   Cardiovascular: Negative.   Gastrointestinal: Negative.   Endocrine: Negative.   Musculoskeletal: Positive for arthralgias.  Skin: Negative.   Allergic/Immunologic: Negative.   Neurological: Negative.   Hematological: Negative.   Psychiatric/Behavioral: Negative.   All other systems reviewed and are negative.   BP 180/90 mmHg  Pulse 70  Ht 6\' 1"  (1.854 m)  Wt 254 lb 6.4 oz (115.395 kg)  BMI 33.57 kg/m2  recheck blood pressure also 180/100  Physical Exam  Constitutional: He is oriented to person, place, and time. He appears well-developed and well-nourished.  Obese  HENT:  Head: Normocephalic.  Nose: Nose normal.  Mouth/Throat: Oropharynx is clear and moist.  Eyes: Conjunctivae are normal. Pupils are equal, round, and reactive to light.  Neck: Normal range of motion. Neck supple. No JVD present.  Cardiovascular: Normal rate, regular rhythm, normal heart sounds and intact distal pulses.  Exam reveals no gallop and no friction rub.   No murmur heard. Pulmonary/Chest: Effort normal and breath sounds normal. No respiratory distress. He has no wheezes. He has no rales. He exhibits no tenderness.  Abdominal: Soft. Bowel sounds are normal. He exhibits no distension. There is no tenderness.  Musculoskeletal: Normal range of motion. He exhibits no edema or tenderness.  Lymphadenopathy:    He has no cervical adenopathy.  Neurological: He is alert and oriented to person, place, and time. Coordination normal.  Skin: Skin is warm and dry. No rash noted. No erythema.  Psychiatric: He has a normal mood and affect. His behavior is normal. Judgment and thought content normal.

## 2015-03-20 NOTE — Assessment & Plan Note (Signed)
We have encouraged him to continue to work on weaning his cigarettes and smoking cessation. He will continue to work on this and does not want any assistance with chantix.  

## 2015-03-20 NOTE — Patient Instructions (Addendum)
You are doing well.  We will order a CT coronary calcium score, for atherosclerosis on CT scan  Please increase the bystolic up to 5 mg twice a day Monitor blood pressure If blood pressure continues to run high, Increase the lisinopril up to 10 mg twice a day  Please call us if you have new issues that need to be addressed before your next appt.  Your physician wants you to follow-up in: 6 months.

## 2015-03-20 NOTE — Assessment & Plan Note (Signed)
Recommended compression hose for leg edema, leg elevation

## 2015-03-20 NOTE — Assessment & Plan Note (Signed)
He reports that he drinks 2-4 drinks per day  we did discuss effect on his heart and liver as well as risk of cardiopathy

## 2015-04-08 ENCOUNTER — Ambulatory Visit: Payer: 59 | Admitting: Family Medicine

## 2015-04-21 ENCOUNTER — Ambulatory Visit (INDEPENDENT_AMBULATORY_CARE_PROVIDER_SITE_OTHER)
Admission: RE | Admit: 2015-04-21 | Discharge: 2015-04-21 | Disposition: A | Discharge status: Home / Self Care | Payer: Self-pay | Source: Ambulatory Visit | Attending: Cardiovascular Disease | Admitting: Cardiovascular Disease

## 2015-04-21 DIAGNOSIS — I25810 Atherosclerosis of coronary artery bypass graft(s) without angina pectoris: Secondary | ICD-10-CM

## 2015-04-24 ENCOUNTER — Telehealth: Payer: Self-pay | Admitting: Cardiovascular Disease

## 2015-04-24 NOTE — Telephone Encounter (Signed)
Pt had CT cardiac scoring April 11. Results in MD basket and have been read.  Awaiting further instruction from MD before calling pt w/results.

## 2015-04-24 NOTE — Telephone Encounter (Signed)
Patient called and is wanting the results of the CT calcium score.

## 2015-05-06 ENCOUNTER — Encounter: Payer: Self-pay | Admitting: Cardiovascular Disease

## 2015-05-06 ENCOUNTER — Ambulatory Visit (INDEPENDENT_AMBULATORY_CARE_PROVIDER_SITE_OTHER): Payer: 59 | Admitting: Cardiovascular Disease

## 2015-05-06 VITALS — BP 180/100 | HR 69 | Ht 72.0 in | Wt 247.8 lb

## 2015-05-06 DIAGNOSIS — E669 Obesity, unspecified: Secondary | ICD-10-CM | POA: Diagnosis not present

## 2015-05-06 DIAGNOSIS — E785 Hyperlipidemia, unspecified: Secondary | ICD-10-CM

## 2015-05-06 DIAGNOSIS — F1010 Alcohol abuse, uncomplicated: Secondary | ICD-10-CM

## 2015-05-06 DIAGNOSIS — F1011 Alcohol abuse, in remission: Secondary | ICD-10-CM

## 2015-05-06 DIAGNOSIS — I2510 Atherosclerotic heart disease of native coronary artery without angina pectoris: Secondary | ICD-10-CM

## 2015-05-06 DIAGNOSIS — K700 Alcoholic fatty liver: Secondary | ICD-10-CM

## 2015-05-06 DIAGNOSIS — I25118 Atherosclerotic heart disease of native coronary artery with other forms of angina pectoris: Secondary | ICD-10-CM | POA: Insufficient documentation

## 2015-05-06 DIAGNOSIS — Z720 Tobacco use: Secondary | ICD-10-CM

## 2015-05-06 DIAGNOSIS — I10 Essential (primary) hypertension: Secondary | ICD-10-CM | POA: Diagnosis not present

## 2015-05-06 MED ORDER — NEBIVOLOL HCL 20 MG PO TABS: 20.0000 mg | ORAL_TABLET | Freq: Every day | ORAL | Status: DC

## 2015-05-06 MED ORDER — LISINOPRIL 40 MG PO TABS: 40.0000 mg | ORAL_TABLET | Freq: Every day | ORAL | Status: DC

## 2015-05-06 MED ORDER — VARENICLINE TARTRATE 1 MG PO TABS: 1.0000 mg | ORAL_TABLET | Freq: Two times a day (BID) | ORAL | Status: DC

## 2015-05-06 NOTE — Assessment & Plan Note (Addendum)
Long discussion concerning his smoking cessation. He is interested in trying Chantix. Coupon and instructions provided to him   Total encounter time more than 45 minutes  Greater than 50% was spent in counseling and coordination of care with the patient

## 2015-05-06 NOTE — Assessment & Plan Note (Signed)
We have encouraged continued exercise, careful diet management in an effort to lose weight. Limited ability to exercise secondary to severe knee pain

## 2015-05-06 NOTE — Patient Instructions (Addendum)
Blood pressure is elevated  Please increase the bystolic up to 20 mg daily Please increase the zestril up to 40 mg daily Please monitor blood pressure  Please start chantix 1/2 pill twice a day for 1 to 2 weeks Then increase up to a fuill pill twice a day  Please call if you have chest pain or shortness of breath  Please call us if you have new issues that need to be addressed before your next appt.  Your physician wants you to follow-up in: 3 months.  You will receive a reminder letter in the mail two months in advance. If you don't receive a letter, please call our office to schedule the follow-up appointment.

## 2015-05-06 NOTE — Assessment & Plan Note (Signed)
Spent some time discussing his chronic alcohol use. He is trying to cut down, reports is not a problem, he can stop that any time but enjoys it

## 2015-05-06 NOTE — Progress Notes (Signed)
Patient ID: Peter [name], male    DOB: [birth year], 60 y.o.   MRN: [id number]  HPI Comments:  Mr. Ferrand is a 60 year old gentleman  With long history of smoking, hypertension, daily alcohol drinking, prostate cancer , statin intolerance , previously seen by Dr. Algernon Huxley last in May 2015 who presents for follow-up of his coronary artery disease, hypertension, hyperlipidemia  patient of Dr. Lacinda Axon  On his last clinic visit, blood pressure was very elevated, we increased his lisinopril and his bystolic. On today's visit, blood pressure continues to run very high. He reports that he is asymptomatic, has been taking lisinopril 10 mg twice a day,bystolic   He reports that his blood pressure has been labile.  He continues to drink 4 beers per day Smokes 4-5 cigarettes per day  Feels he might be depressed, has severe arthritis in his legs in the morning,  Reports that he has never "weight over 200 pounds before". Poor diet   Previous stress test July 2016 showing no ischemia CT scan of the chest, CT coronary calcium score discussed with him in detail, images pulled up in the office. His scores close to 3000. He denies any anginal symptoms. Images show diffuse heavy calcification/calcified atherosclerosis in the LAD, diagonal branch, ostial and proximal RCA. No significant disease noted in the left circumflex.  Long discussion concerning his use of statins in the past, he reports being debilitated when he was on Crestor, Lipitor He had diffuse muscle pain in his legs, has tried different doses, does not want to retry a statin given the debilitating side effects. Feels it made it impossible to get around, on top of his arthritis, severe knee pain, it was just too much  Other past medical history reviewed  CT scan of the abdomen  shows scattered diffuse mild aortic atherosclerosis.   Lab work reviewed with him showing hemoglobin A1c 5.5 , total cholesterol more than 230    Allergies  Allergen  Reactions  . Crestor [Rosuvastatin] Other (See Comments)    Bloating, swelling of legs, fatty liver  . Hydrocodone Bitartrate Er   . Lipitor [Atorvastatin] Other (See Comments)    Myalgias    Current Outpatient Prescriptions on File Prior to Visit  Medication Sig Dispense Refill  . aspirin EC 81 MG tablet Take 1 tablet (81 mg total) by mouth daily. 90 tablet 3  . chlordiazePOXIDE (LIBRIUM) 25 MG capsule Take 25 mg by mouth once.     . colchicine 0.6 MG tablet Take 0.6 mg by mouth as needed (gout). For gout flare ups    . esomeprazole (NEXIUM) 20 MG capsule Take 20 mg by mouth daily at 12 noon.    . hydrOXYzine (ATARAX/VISTARIL) 50 MG tablet Take 50 mg by mouth at bedtime. Reported on 02/04/2015    . milk thistle 175 MG tablet Take 350 mg by mouth daily.     . Multiple Vitamin (MULTIVITAMIN) tablet Take 1 tablet by mouth daily. Centrum Plus -Take 1 daily    . tamsulosin (FLOMAX) 0.4 MG CAPS capsule Take 0.4 mg by mouth daily.     . traMADol (ULTRAM) 50 MG tablet     . triamcinolone cream (KENALOG) 0.1 %     . VOLTAREN 1 % GEL Apply 2 g topically as needed (knees).      No current facility-administered medications on file prior to visit.    Past Medical History  Diagnosis Date  . Hypertension   . Anxiety   . Palpitations   .  Hyperlipidemia   . Internal hemorrhoids   . Tubular adenoma of colon 03/2008  . Arthritis   . Murmur, cardiac   . Esophageal stricture   . Lesion of ulnar nerve 09/03/2012    Right ulnar neuropathy  . Abnormality of gait 09/03/2012  . Alcohol abuse   . GERD (gastroesophageal reflux disease)   . Seizures ([place])     pt states he had seizure 12/14  . Gout   . Prostate cancer ([place]) 2016    treated with radioactive seed implant    Past Surgical History  Procedure Laterality Date  . Hand surgery Left   . Circumcision    . Colonoscopy    . Elbow surgery    . Shoulder surgery    . Eye surgery    . Cataract extraction w/phaco Right 07/03/2014    Procedure:  CATARACT EXTRACTION PHACO AND INTRAOCULAR LENS PLACEMENT (IOC);  Surgeon: Lyla Glassing, MD;  Location: [facility] ORS;  Service: Ophthalmology;  Laterality: Right;  lot # [id number] H [place]: 00:32.3 AP% 9.1 CDE: 2.92    Social History  reports that he has been smoking Cigarettes.  He has a 6 pack-year smoking history. He has never used smokeless tobacco. He reports that he drinks about 7.2 oz of alcohol per week. He reports that he does not use illicit drugs.  Family History family history includes Diabetes in his father; Hyperlipidemia in his father; Hypertension in his father and mother; Kidney disease in his father; Lung cancer in his father and mother. There is no history of Colon cancer, Pancreatic cancer, or Stomach cancer.   Review of Systems  Constitutional: Negative.   Respiratory: Negative.   Cardiovascular: Negative.   Gastrointestinal: Negative.   Musculoskeletal: Positive for arthralgias.  Neurological: Negative.   Hematological: Negative.   Psychiatric/Behavioral: Negative.   All other systems reviewed and are negative.   BP 180/100 mmHg  Pulse 69  Ht 6' (1.829 m)  Wt 247 lb 12 oz (112.379 kg)  BMI 33.59 kg/m2  recheck blood pressure also 180/100  Physical Exam  Constitutional: He is oriented to person, place, and time. He appears well-developed and well-nourished.  Obese  HENT:  Head: Normocephalic.  Nose: Nose normal.  Mouth/Throat: Oropharynx is clear and moist.  Eyes: Conjunctivae are normal. Pupils are equal, round, and reactive to light.  Neck: Normal range of motion. Neck supple. No JVD present.  Cardiovascular: Normal rate, regular rhythm, normal heart sounds and intact distal pulses.  Exam reveals no gallop and no friction rub.   No murmur heard. Pulmonary/Chest: Effort normal. No respiratory distress. He has decreased breath sounds. He has no wheezes. He has no rales. He exhibits no tenderness.  Abdominal: Soft. Bowel sounds are normal. He exhibits no  distension. There is no tenderness.  Musculoskeletal: Normal range of motion. He exhibits no edema or tenderness.  Lymphadenopathy:    He has no cervical adenopathy.  Neurological: He is alert and oriented to person, place, and time. Coordination normal.  Skin: Skin is warm and dry. No rash noted. No erythema.  Psychiatric: He has a normal mood and affect. His behavior is normal. Judgment and thought content normal.

## 2015-05-06 NOTE — Assessment & Plan Note (Signed)
Intolerant of Crestor, Lipitor. Notes indicating previously on pravastatin, unclear why this was stopped. He does not want to retry a statin. Given his severe diffuse coronary disease, continued smoking, he is high risk of ischemia.  We did discuss some of the newer agents. Will inquire whether he might be a candidate for repatha or praluent given his statin intolerance, severe coronary disease as well as aortic atherosclerosis.

## 2015-05-06 NOTE — Assessment & Plan Note (Signed)
Recommended alcohol cessation, total bilirubin up 3 months ago, up to 2.1

## 2015-05-06 NOTE — Assessment & Plan Note (Signed)
Diffuse heavy coronary atherosclerosis seen on CT scan Predominately in LAD, RCA as well as diagonal branch Images discussed with him in detail, long discussion concerning anginal symptoms. He denies any symptoms at this time. If he develops worsening shortness of breath or chest discomfort, recommended he call our office for cardiac catheterization

## 2015-05-06 NOTE — Assessment & Plan Note (Signed)
Blood pressure continues to be poorly controlled Recommended he increase lisinopril up to 40 mg daily, would increase bytsolic up to 20 mg daily With closely monitor his blood pressure and call our office if this continues to run high Other options for blood pressure includes amlodipine, clonidine, etc.

## 2015-05-12 ENCOUNTER — Telehealth: Payer: Self-pay | Admitting: *Deleted

## 2015-05-12 ENCOUNTER — Telehealth: Payer: Self-pay | Admitting: Cardiovascular Disease

## 2015-05-12 NOTE — Telephone Encounter (Signed)
Hyperlipidemia - Peter Merritts, MD at 05/06/2015 1:08 PM          Intolerant of Crestor, Lipitor. Notes indicating previously on pravastatin, unclear why this was stopped. He does not want to retry a statin. Given his severe diffuse coronary disease, continued smoking, he is high risk of ischemia.  We did discuss some of the newer agents. Will inquire whether he might be a candidate for repatha or praluent given his statin intolerance, severe coronary disease as well as aortic atherosclerosis.

## 2015-05-12 NOTE — Telephone Encounter (Signed)
Pt requiring PA for Chantix PA has been FWD to [facility] will await approval.

## 2015-05-12 NOTE — Telephone Encounter (Signed)
Pt calling stating last here was here that Dr Rockey Situ had suggested that he try the monthly "pen" to help with his Cholesterol  Was told that we would do the research for patient and find out if his insurance covers it or if this a fit for him He states he can't take Lipitor or Crestor, states he is allergic to it Please advise

## 2015-05-13 ENCOUNTER — Other Ambulatory Visit: Payer: Self-pay | Admitting: Nurse Practitioner

## 2015-05-13 NOTE — Telephone Encounter (Signed)
Reviewed pts chart and [employer] criteria.  Will need updated labwork.  Spoke with pt.  He would prefer to have it drawn at [facility].  Will send an order so he can have it done and faxed to our office.

## 2015-05-19 NOTE — Telephone Encounter (Signed)
PA under prior review.

## 2015-05-21 ENCOUNTER — Encounter: Payer: Self-pay | Admitting: Pharmacist

## 2015-05-21 ENCOUNTER — Other Ambulatory Visit: Payer: Self-pay | Admitting: Cardiovascular Disease

## 2015-05-22 LAB — LIPID PANEL W/O CHOL/HDL RATIO
Cholesterol, Total: 326 mg/dL — ABNORMAL HIGH (ref 100–199)
HDL: 64 mg/dL (ref >39)
LDL CALC: 227 mg/dL — AB (ref 0–99)
Triglycerides: 173 mg/dL — ABNORMAL HIGH (ref 0–149)
VLDL Cholesterol Cal: 35 mg/dL (ref 5–40)

## 2015-05-22 NOTE — Telephone Encounter (Signed)
Received lab results from [place].  TC 326, TG 173, HDL 64, LDL 227.  Will submit PA for Praluent at this time.   Pt aware of lab results.

## 2015-05-25 NOTE — Telephone Encounter (Signed)
Pt has been denied for Chantix. Pt must have tried or have hx of failure, contraindication/intolerance to one of the following: Habitrol; Nicoderm, Nicorette gum, Nicorette lozenge, nicorette mini lozenge, thrive gum or thrive lozenge and or bupropion.

## 2015-05-29 ENCOUNTER — Telehealth: Payer: Self-pay | Admitting: Cardiovascular Disease

## 2015-05-29 NOTE — Telephone Encounter (Signed)
Received records request Disabiltiy Determination Services, forwarded to [employer] for processing.  

## 2015-06-01 ENCOUNTER — Encounter: Payer: Self-pay | Admitting: *Deleted

## 2015-06-01 NOTE — Telephone Encounter (Signed)
Spoke with pt and he mentioned he has tried nicoderm patch as well as the gum and both are unaffective.

## 2015-06-01 NOTE — Telephone Encounter (Signed)
Appeal letter has been submitted for review awaiting response.

## 2015-06-02 MED ORDER — ALIROCUMAB 75 MG/ML ~~LOC~~ SOPN: 75.0000 mg | PEN_INJECTOR | SUBCUTANEOUS | Status: DC

## 2015-06-02 NOTE — Addendum Note (Signed)
Addended by: Elberta Leatherwood R on: 06/02/2015 04:26 PM   Modules accepted: Orders

## 2015-06-02 NOTE — Addendum Note (Signed)
Addended by: Aris Georgia on: 06/02/2015 04:39 PM   Modules accepted: Orders

## 2015-06-02 NOTE — Telephone Encounter (Signed)
Praluent PA approved.  Spoke with pt.  Rx sent to [facility].  Instructed pt to call once he received shipment to discuss injection technique.

## 2015-06-04 ENCOUNTER — Other Ambulatory Visit: Payer: Self-pay

## 2015-06-09 NOTE — Telephone Encounter (Signed)
Pt has been denied for Chantix pak after appeal letter. Please advise.

## 2015-06-12 ENCOUNTER — Telehealth: Payer: Self-pay | Admitting: Cardiovascular Disease

## 2015-06-12 NOTE — Telephone Encounter (Signed)
Received records request Disability Determination Services, forwarded to [employer] for processing.  

## 2015-06-12 NOTE — Telephone Encounter (Signed)
-   Informed patient regarding release of information packet.  - Mailed release packet to patient.

## 2015-06-15 ENCOUNTER — Telehealth: Payer: Self-pay | Admitting: Cardiovascular Disease

## 2015-06-15 ENCOUNTER — Other Ambulatory Visit: Payer: Self-pay

## 2015-06-15 MED ORDER — VARENICLINE TARTRATE 1 MG PO TABS: 1.0000 mg | ORAL_TABLET | Freq: Two times a day (BID) | ORAL | Status: DC

## 2015-06-15 NOTE — Telephone Encounter (Signed)
Pt has been approved for Chantix for zero copay with deductible bypass for three months per twelve month period. Pt has been approved for chantix until May 30,2018.

## 2015-06-15 NOTE — Telephone Encounter (Signed)
Requested Prescriptions   Signed Prescriptions Disp Refills  . varenicline (CHANTIX CONTINUING MONTH PAK) 1 MG tablet 60 tablet 6    Sig: Take 1 tablet (1 mg total) by mouth 2 (two) times daily.    Authorizing Provider: Minna Merritts    Ordering User: Britt Bottom

## 2015-06-15 NOTE — Telephone Encounter (Signed)
Attempted to contact pt.  No answer, no machine.  Pt's Chantix rx has been approved thru May 2018.

## 2015-06-15 NOTE — Telephone Encounter (Signed)
Pt has some questions regarding Chantix. Please call. [place] on [place]

## 2015-06-25 ENCOUNTER — Telehealth: Payer: Self-pay | Admitting: Cardiovascular Disease

## 2015-06-25 NOTE — Telephone Encounter (Signed)
Pt calling stating he did not receive his Medical Release package from [place]. Sent another package to him.  Also had some questions about his medications.  Pt c/o BP issue: STAT if pt c/o blurred vision, one-sided weakness or slurred speech  1. What are your last 5 BP readings?  06/25/15 161/104 about an hour ago  158/97 (when he woke up 7 am)  06/24/15 (doubled up on medication) 110/78 pm 158/97 am 06/23/15  142/91 pm     2. Are you having any other symptoms (ex. Dizziness, headache, blurred vision, passed out)? Nothing, just a little headache in the morning.   3. What is your BP issue? Would like to know which medication that he is taking can be taken to control more of his BP  Was told by Dr Rockey Situ he could take a little more of Lisinopril 40 mg Bystolic 5 mg now is taking 20 mg  Still not working.

## 2015-06-25 NOTE — Telephone Encounter (Signed)
Spoke w/ pt.  He reports that is BP "is all over the place".  Reports that he checks his BP 3-4 times per day. At last ov, lisinopril was increased from 10 mg to 40 mg daily and Bystolic was increased from 5 mg to 20 mg daily.  He reports that he has also been taking 1/2 doses of these meds in the PM, around 5pm. Advised pt to monitor his BP 1-2 times daily, no more than twice. He verbalizes that he will check 1-2 hrs after his am meds, and then again 1-2 hrs after pm meds. Pt admits that he focuses on BP throughout the day and that's why he checks it so frequently, esp if/when he does not feel well. Asked pt to call back next week w/ readings.

## 2015-06-25 NOTE — Telephone Encounter (Signed)
Attempted to contact pt.  No answer, no machine, unable to leave message.

## 2015-06-29 ENCOUNTER — Telehealth: Payer: Self-pay | Admitting: *Deleted

## 2015-06-29 NOTE — Telephone Encounter (Signed)
Patient stated he would like to have better pain medication.  His orthopedic gave him tramadol for sx.  Patient has been having a lot of pain in his leg and back.  Last night/early this morning px was 8/10 pain.  At the moment 4/10 due to taking two tramadol tablets

## 2015-06-29 NOTE — Telephone Encounter (Signed)
Patient requested to have a medication to help with his back and leg pain. He's been seen by the orthopedic and was advised that the pain is part of his condition. He requested to have the nurse call him back to discuss his issue, before scheduling an appointment.  Pt contact [phone]

## 2015-06-29 NOTE — Telephone Encounter (Signed)
This should come from ortho. I do not prescribed chronic narcotics.

## 2015-06-30 NOTE — Telephone Encounter (Signed)
Tried to speak with patient but was unable to reach patient or leave a message.  Patient does not have answering service.

## 2015-06-30 NOTE — Telephone Encounter (Signed)
Informed patient that Dr. Lacinda Axon does not prescribe Chronic narcotics.  Patient said he will bring it up with his Orthopedics.  Patient understood and had no questions, comments, or concerns.

## 2015-06-30 NOTE — Telephone Encounter (Signed)
Patient has requested a call in return he will be at [phone] today

## 2015-07-16 ENCOUNTER — Telehealth: Payer: Self-pay | Admitting: Cardiovascular Disease

## 2015-07-16 ENCOUNTER — Other Ambulatory Visit: Payer: Self-pay | Admitting: Cardiovascular Disease

## 2015-07-16 ENCOUNTER — Other Ambulatory Visit: Payer: Self-pay | Admitting: *Deleted

## 2015-07-16 MED ORDER — NEBIVOLOL HCL 20 MG PO TABS: ORAL_TABLET | ORAL | Status: DC

## 2015-07-16 MED ORDER — LISINOPRIL 40 MG PO TABS: 40.0000 mg | ORAL_TABLET | Freq: Every day | ORAL | Status: DC

## 2015-07-16 NOTE — Telephone Encounter (Signed)
Spoke w/ pt.  He would like to know if he can use a TENS unit for his back.  Advised him that he may use this w/ no interactions. He is appreciative and will call back w/ any questions or concerns.

## 2015-07-16 NOTE — Telephone Encounter (Signed)
Pt calling stating he needs a refill Insurance won't pay until we send in  Pt had this new machine for his back. Wants to also make sure he is allowed to use this. Please advise.   *STAT* If patient is at the pharmacy, call can be transferred to refill team.  1. Which medications need to be refilled? (please list name of each medication and dose if known) Lisinopril 40 mg 2 x a day Bystolic 20 mg 2 x a day    2. Which pharmacy/location (including street and city if local pharmacy) is medication to be sent to? [facility] on [address]   3. Do they need a 30 day or 90 day supply? [place]

## 2015-07-16 NOTE — Telephone Encounter (Signed)
Lisinopril 40 mg Takes 1 tablet by mouth daily. 123456 R#3 Bystolic 20 mg Takes 1 tablet by mouth daily.  #90 R#3 sent to [facility]. Per last OV with Dr. Rockey Situ.

## 2015-07-16 NOTE — Telephone Encounter (Signed)
Please see note below pt has questions about using new machine for back. Please advise.

## 2015-07-17 NOTE — Telephone Encounter (Addendum)
I discussed this w/ him previously.  Pt is very difficult to keep on topic during discussions.  He is to take Bystolic 20 mg once daily.   His updated rx will reflect this.

## 2015-07-17 NOTE — Telephone Encounter (Signed)
I spoke with pt this am and he mentioned that Peter Situ, MD told him to take Bytolic 20 mg 1 tablet am and 1 tablet pm. Pt said that he is only taking 1 tablet am and 1/2 tablet pm. I let him know based on his last OV and instructions He was told to take bystolic 20 mg 1 tablet daily. Please advise pt so that correct Rx can be sent to Pharmacy.

## 2015-07-20 ENCOUNTER — Other Ambulatory Visit: Payer: Self-pay

## 2015-07-20 NOTE — Telephone Encounter (Signed)
Pt states he talked with Dr. Rockey Situ about increasing the mg.

## 2015-07-20 NOTE — Telephone Encounter (Signed)
[name] is having problems with his Bystolic running out to soon.  The patient states he is taking Bystolic 20 mg one tablet in the am and 1/2 to 1 in the pm depending on his BP reading. The last office note has Bystolic 20 mg one tablet daily. Please advise how the patient needs to take the Bystolic.   Patient will be away from home until after 2 pm today.

## 2015-07-20 NOTE — Telephone Encounter (Signed)
Patient needs an updated rx for bystolic sent to [facility] [address] [place]    Patient also Wants to do labs prior to upcoming appt in September at [facility] off of [address] behind [employer] .  Patient wants to have a liver panel added as well.

## 2015-07-21 MED ORDER — NEBIVOLOL HCL 20 MG PO TABS
30.0000 mg | ORAL_TABLET | Freq: Every day | ORAL | Status: DC
Start: 2015-07-21 — End: 2015-09-21

## 2015-07-21 NOTE — Telephone Encounter (Signed)
Informed pt that bystolic refill was submitted today by Christell Faith, PA-C to [facility]. Pt verbalized understanding and is appreciative of the call.

## 2015-07-21 NOTE — Telephone Encounter (Signed)
patient was advised at his last OV to increae Bystolic to 20 mg daily. Last phone indicates he self titrated dose by adding PM dose on his own. If he is having controlled BP with this dose ok to continue. Rx sent in.

## 2015-09-15 ENCOUNTER — Telehealth: Payer: Self-pay | Admitting: Gastroenterology

## 2015-09-15 ENCOUNTER — Telehealth: Payer: Self-pay | Admitting: Cardiovascular Disease

## 2015-09-15 DIAGNOSIS — E785 Hyperlipidemia, unspecified: Secondary | ICD-10-CM

## 2015-09-15 NOTE — Telephone Encounter (Signed)
Notes Recorded by Ladene Artist, MD on 01/10/2014 at 12:36 PM Pancreatic cyst is smaller-imaging suggests a benign lesion, likely a pseudocyst Mild hepatic steatosis No further imaging is recommended  Left message for patient to call back

## 2015-09-15 NOTE — Telephone Encounter (Signed)
Spoke w/ pt.  Advised him that I can put the orders in for him.  He asks that I fax the orders to [facility] at [phone].

## 2015-09-15 NOTE — Telephone Encounter (Signed)
Pt states he is having his PSA tomorrow at [facility], states he asks if he can have his lipid/liver, kidney "the whole thing"  panel drawn at the same time. Please call and advise. States his wife works at [employer] and he can get labs for free.

## 2015-09-15 NOTE — Telephone Encounter (Signed)
Patient notified that he does not need any further imaging of pancreas.  He will call back for any additional questions or new GI concerns

## 2015-09-16 ENCOUNTER — Encounter: Payer: Self-pay | Admitting: Cardiovascular Disease

## 2015-09-16 ENCOUNTER — Other Ambulatory Visit: Payer: Self-pay | Admitting: Cardiovascular Disease

## 2015-09-17 LAB — LIPID PANEL W/O CHOL/HDL RATIO
CHOLESTEROL TOTAL: 264 mg/dL — AB (ref 100–199)
HDL: 36 mg/dL — ABNORMAL LOW (ref >39)
LDL CALC: 174 mg/dL — AB (ref 0–99)
TRIGLYCERIDES: 270 mg/dL — AB (ref 0–149)
VLDL CHOLESTEROL CAL: 54 mg/dL — AB (ref 5–40)

## 2015-09-17 LAB — HEPATIC FUNCTION PANEL
ALT: 32 IU/L (ref 0–44)
AST: 75 IU/L — ABNORMAL HIGH (ref 0–40)
Albumin: 3.9 g/dL (ref 3.5–5.5)
Alkaline Phosphatase: 97 IU/L (ref 39–117)
BILIRUBIN TOTAL: 2.5 mg/dL — AB (ref 0.0–1.2)
BILIRUBIN, DIRECT: 0.78 mg/dL — AB (ref 0.00–0.40)
TOTAL PROTEIN: 7.4 g/dL (ref 6.0–8.5)

## 2015-09-21 ENCOUNTER — Encounter: Payer: Self-pay | Admitting: Cardiovascular Disease

## 2015-09-21 ENCOUNTER — Ambulatory Visit (INDEPENDENT_AMBULATORY_CARE_PROVIDER_SITE_OTHER): Payer: 59 | Admitting: Cardiovascular Disease

## 2015-09-21 VITALS — BP 172/110 | HR 104 | Ht 72.0 in | Wt 252.5 lb

## 2015-09-21 DIAGNOSIS — I2510 Atherosclerotic heart disease of native coronary artery without angina pectoris: Secondary | ICD-10-CM

## 2015-09-21 DIAGNOSIS — K700 Alcoholic fatty liver: Secondary | ICD-10-CM

## 2015-09-21 DIAGNOSIS — I10 Essential (primary) hypertension: Secondary | ICD-10-CM | POA: Diagnosis not present

## 2015-09-21 DIAGNOSIS — I483 Typical atrial flutter: Secondary | ICD-10-CM

## 2015-09-21 DIAGNOSIS — Z720 Tobacco use: Secondary | ICD-10-CM

## 2015-09-21 MED ORDER — DILTIAZEM HCL ER COATED BEADS 120 MG PO CP24: 120.0000 mg | ORAL_CAPSULE | Freq: Two times a day (BID) | ORAL | 3 refills | Status: DC

## 2015-09-21 MED ORDER — LOSARTAN POTASSIUM 100 MG PO TABS: 100.0000 mg | ORAL_TABLET | Freq: Every day | ORAL | 0 refills | Status: DC

## 2015-09-21 MED ORDER — NEBIVOLOL HCL 20 MG PO TABS: 40.0000 mg | ORAL_TABLET | Freq: Two times a day (BID) | ORAL | 3 refills | Status: DC

## 2015-09-21 NOTE — Patient Instructions (Addendum)
Medication Instructions:   Please start diltiazem 120 mg twice a day for heart speed, atrial flutter, also helps blood pressure  Please start eliquis twice a day, blood thinner Try to minimize advil Stop aspirin  Take one lisinopril a day When you run out in 4 days, take losartan one a day for one month then change back to lisinopril   Labwork:  No new labs needed  Testing/Procedures:  No further testing at this time   Follow-Up: It was a pleasure seeing you in the office today. Please call us if you have new issues that need to be addressed before your next appt.  [phone]  Your physician wants you to follow-up in: 1 month.  You will receive a reminder letter in the mail two months in advance. If you don't receive a letter, please call our office to schedule the follow-up appointment.  If you need a refill on your cardiac medications before your next appointment, please call your pharmacy.

## 2015-09-21 NOTE — Progress Notes (Signed)
Cardiology Office Note  Date:  09/21/2015   ID:  RORY SWATEK, DOB [birth year], MRN [id number]  PCP:  Coral Spikes, DO   Chief Complaint  Patient presents with  . Other    6 month f/u c/o body shaking. Meds reviewed verbally with pt.    HPI:  Mr. Canupp is a 60 year old gentleman  with long history of smoking, hypertension, daily alcohol drinking, prostate cancer , statin intolerance ,  who presents for follow-up of his coronary artery disease, hypertension, hyperlipidemia  patient of Dr. Lacinda Axon History of underlying liver dysfunction, elevated total bilirubin  In follow-up today, he reports that he continues to smoke, used chantix, down to 3 to 4 cigs a day Still with elevated BP,  Started exercising, walking Limited by back pain , was told he could get a "shot" Trying to watch his diet Tried praluent, had a reaction, like "flu" "shaking" for 2 years since prostate seeds, stopped librium Still with heavy ETOH, previous notes indicating 4-5 beers per day Frequent nocturia Has been taking lisinopril 40 mg x 2, presumably because blood pressure was running high, now running out of his medication. Did not call our office  On today's visit has New atrial flutter EKG with atrial flutter rate 104 bpm, no significant ST or T-wave changes Denies any sx, On discussion of his blood thinner pills, reports he bruises already  Other past medical history reviewed  Previous stress test July 2016 showing no ischemia CT scan of the chest, CT coronary calcium score discussed with him in detail, images pulled up in the office. His scores close to 3000. He denies any anginal symptoms. Images show diffuse heavy calcification/calcified atherosclerosis in the LAD, diagonal branch, ostial and proximal RCA. No significant disease noted in the left circumflex.   he reports being debilitated when he was on Crestor, Lipitor He had diffuse muscle pain in his legs, has tried different doses, does not want  to retry a statin given the debilitating side effects. Feels it made it impossible to get around, on top of his arthritis, severe knee pain, it was just too much  Other past medical history reviewed  CT scan of the abdomen  shows scattered diffuse mild aortic atherosclerosis.   Lab work reviewed with him showing hemoglobin A1c 5.5 , total cholesterol more than 230    PMH:   has a past medical history of Abnormality of gait (09/03/2012); Alcohol abuse; Anxiety; Arthritis; Esophageal stricture; GERD (gastroesophageal reflux disease); Gout; Hyperlipidemia; Hypertension; Internal hemorrhoids; Lesion of ulnar nerve (09/03/2012); Murmur, cardiac; Palpitations; Prostate cancer ([place]) (2016); Seizures ([place]); and Tubular adenoma of colon (03/2008).  PSH:    Past Surgical History:  Procedure Laterality Date  . CATARACT EXTRACTION W/PHACO Right 07/03/2014   Procedure: CATARACT EXTRACTION PHACO AND INTRAOCULAR LENS PLACEMENT (IOC);  Surgeon: Lyla Glassing, MD;  Location: [facility] ORS;  Service: Ophthalmology;  Laterality: Right;  lot # [id number] H [place]: 00:32.3 AP% 9.1 CDE: 2.92  . CIRCUMCISION    . COLONOSCOPY    . ELBOW SURGERY    . EYE SURGERY    . HAND SURGERY Left   . SHOULDER SURGERY      Current Outpatient Prescriptions  Medication Sig Dispense Refill  . Alirocumab (PRALUENT) 75 MG/ML SOPN Inject 75 mg into the skin every 14 (fourteen) days. 2 pen 6  . chlordiazePOXIDE (LIBRIUM) 25 MG capsule Take 25 mg by mouth once.     . colchicine 0.6 MG tablet Take 0.6 mg by mouth as needed (  gout). For gout flare ups    . esomeprazole (NEXIUM) 20 MG capsule Take 20 mg by mouth daily at 12 noon.    . hydrOXYzine (ATARAX/VISTARIL) 50 MG tablet Take 50 mg by mouth at bedtime. Reported on 02/04/2015    . lisinopril (PRINIVIL,ZESTRIL) 40 MG tablet Take 1 tablet (40 mg total) by mouth daily. 90 tablet 3  . milk thistle 175 MG tablet Take 350 mg by mouth daily.     . Multiple Vitamin (MULTIVITAMIN) tablet Take 1  tablet by mouth daily. Centrum Plus -Take 1 daily    . Nebivolol HCl (BYSTOLIC) 20 MG TABS Take 2 tablets (40 mg total) by mouth 2 (two) times daily. 180 tablet 3  . tamsulosin (FLOMAX) 0.4 MG CAPS capsule Take 0.4 mg by mouth daily.     . traMADol (ULTRAM) 50 MG tablet     . triamcinolone cream (KENALOG) 0.1 %     . varenicline (CHANTIX CONTINUING MONTH PAK) 1 MG tablet Take 1 tablet (1 mg total) by mouth 2 (two) times daily. 60 tablet 6  . VOLTAREN 1 % GEL Apply 2 g topically as needed (knees).     Marland Kitchen diltiazem (CARDIZEM CD) 120 MG 24 hr capsule Take 1 capsule (120 mg total) by mouth 2 (two) times daily. 180 capsule 3  . losartan (COZAAR) 100 MG tablet Take 1 tablet (100 mg total) by mouth daily. 30 tablet 0   No current facility-administered medications for this visit.      Allergies:   Crestor [rosuvastatin]; Hydrocodone bitartrate er; and Lipitor [atorvastatin]   Social History:  The patient  reports that he has been smoking Cigarettes.  He has a 6.00 pack-year smoking history. He has never used smokeless tobacco. He reports that he drinks about 7.2 oz of alcohol per week . He reports that he does not use drugs.   Family History:   family history includes Diabetes in his father; Hyperlipidemia in his father; Hypertension in his father and mother; Kidney disease in his father; Lung cancer in his father and mother.    Review of Systems: Review of Systems  Constitutional: Negative.   Respiratory: Negative.   Cardiovascular: Negative.   Gastrointestinal: Negative.   Musculoskeletal: Negative.   Neurological: Negative.   Psychiatric/Behavioral: Negative.   All other systems reviewed and are negative.    PHYSICAL EXAM: VS:  BP (!) 172/110 (BP Location: Left Arm, Patient Position: Sitting, Cuff Size: Normal)   Pulse (!) 104   Ht 6' (1.829 m)   Wt 252 lb 8 oz (114.5 kg)   BMI 34.25 kg/m  , BMI Body mass index is 34.25 kg/m. GEN: Well nourished, well developed, in no acute  distress  HEENT: normal  Neck: no JVD, carotid bruits, or masses Cardiac: RRR; no murmurs, rubs, or gallops,no edema  Respiratory:  clear to auscultation bilaterally, normal work of breathing GI: soft, nontender, nondistended, + BS MS: no deformity or atrophy  Skin: warm and dry, no rash Neuro:  Strength and sensation are intact Psych: euthymic mood, full affect    Recent Labs: 02/05/2015: BUN 11; Creatinine, Ser 0.75; Platelets 134; Potassium 4.6; Sodium 130; TSH 1.770 09/16/2015: ALT 32    Lipid Panel Lab Results  Component Value Date   CHOL 264 (H) 09/16/2015   HDL 36 (L) 09/16/2015   LDLCALC 174 (H) 09/16/2015   TRIG 270 (H) 09/16/2015      Wt Readings from Last 3 Encounters:  09/21/15 252 lb 8 oz (114.5 kg)  05/06/15 247 lb 12 oz (112.4 kg)  03/20/15 254 lb 6.4 oz (115.4 kg)       ASSESSMENT AND PLAN:   Coronary artery disease involving native coronary artery of native heart without angina pectoris - Plan: EKG 12-Lead Currently with no symptoms of angina. No further workup at this time. Continue current medication regimen. Stressed importance of staying on his praluent. Calcium score of 3000  Essential hypertension Blood pressure continues to run high Recommended he start diltiazem 120 mrem twice a day for blood pressure and heart rate control Continue his other medications, decrease lisinopril back to 40 mg daily. He had increased this up to 80 mg daily on his own  Typical atrial flutter (HCC) New diagnosis of atrial flutter on today's visit Long discussion concerning risk and benefit of being on anticoagulation He is willing to start eliquis 5 mg twice a day We have recommended he minimize NSAID use, stop aspirin We spent time discussing the next option after anticoagulation for one month. This includes possible ablation versus cardioversion  Alcohol induced fatty liver Stressed importance of alcohol cessation given elevated total bilirubin, fatty  liver  Tobacco abuse Down to several cigarettes per day. Recommended cessation Reports that Chantix is working for him  Long discussion concerning new diagnosis of atrial flutter  Total encounter time more than 40 minutes  Greater than 50% was spent in counseling and coordination of care with the patient  Disposition:   F/U  1 month   Orders Placed This Encounter  Procedures  . EKG 12-Lead     Signed, Esmond Plants, M.D., Ph.D. 09/21/2015  [place], [place]

## 2015-09-22 ENCOUNTER — Telehealth: Payer: Self-pay | Admitting: Cardiovascular Disease

## 2015-09-22 ENCOUNTER — Telehealth: Payer: Self-pay

## 2015-09-22 MED ORDER — APIXABAN 5 MG PO TABS: 5.0000 mg | ORAL_TABLET | Freq: Two times a day (BID) | ORAL | 6 refills | Status: DC

## 2015-09-22 NOTE — Telephone Encounter (Signed)
Spoke with patient and he denies having any chest pain at this time. He reported that he had some tingling but he didn't know if it was from taking the extra dose of eliquis or not. Instructed him to continue monitoring his symptoms and if he should develop chest pain then that would be a concern. He stated that he was reading his paperwork and took the 2 tablets by accident. Instructed him to skip this afternoon dose and then resume tomorrow taking 1 tablet twice a day and prescription sent in for this. He verbalized understanding of our conversation and had no further questions at this time. Let him know to please call back if he had any questions or further problems.

## 2015-09-22 NOTE — Telephone Encounter (Signed)
Pt is asking will he get a rx for Eliquis, states he was given samples and a copay card, but would like a rx.

## 2015-09-22 NOTE — Telephone Encounter (Signed)
Would stay on eliquis 5 BID Would not stop After one month on eliquis, we can discuss optiopn for getting him out of atrial flutter, but stay on eliquis BID

## 2015-09-22 NOTE — Telephone Encounter (Signed)
Spoke with Estill Bamberg pharmacist on duty and made her aware that I canceled prescription for Eliquis as the patient is to come in and see Dr. Rockey Situ after current supply. She verbalized understanding to cancel this order and had no further questions.

## 2015-09-22 NOTE — Telephone Encounter (Signed)
Pt calling stating he accident;y 10 mg of Eliquis this morning.  Spoke to pharmacist and he told him he was to take 5 in the am and 5 mg in the pm He was then told not to take another one for today.  But pt states he is now feeling a bit of tingling in his chest, not a pain just a feeling.  He is just making sure he will be okay.  Please advise.

## 2015-09-22 NOTE — Telephone Encounter (Signed)
Duplicate note. Please see other telephone note.

## 2015-09-22 NOTE — Telephone Encounter (Signed)
Spoke with patient again to verify current supply of medication. Per Dr. Donivan Scull note he was to take the samples and then come back in for follow up to discuss further plan of care. He then stated he had forgot about all of that and now he remembers. Let him know that I canceled the refills for Eliquis at this time and also notified pharmacy to not fill this at this time. Patient verbalized understanding that he is to see Dr. Rockey Situ again before getting more of this medication. Reviewed instructions with him on taking this again and that if he has any further questions to please call back. He verbalized understanding of our conversation and had no further questions.

## 2015-09-22 NOTE — Addendum Note (Signed)
Addended by: Valora Corporal on: 09/22/2015 10:49 AM   Modules accepted: Orders

## 2015-09-24 ENCOUNTER — Telehealth: Payer: Self-pay | Admitting: Cardiovascular Disease

## 2015-09-24 NOTE — Telephone Encounter (Signed)
Pt has a question regarding Cartia-XT  States his BP is 131/85 Should he still take his BP medication, bystolic and Lisinopril? Please advise

## 2015-09-24 NOTE — Telephone Encounter (Signed)
Spoke w/ pt.  He reports that he picked up his CARDIZEM CD and is wondering if he should indeed take this med. Pt reports this his BP has improved since starting Eliquis and he would like to know if he should be on both meds. Advised pt that blood thinners do not affect BP, but he states that he is "familiar with medicine" and that it does.  He states that 131/85 is a low BP for him. Pt feels fine - he states that he read the package insert for the Cardizem and did not realize that it is a BP pill. Advised him to take his meds as prescribed, continue to monitor his BP and call if he has any further questions or concerns.

## 2015-09-28 MED ORDER — DILTIAZEM HCL ER COATED BEADS 120 MG PO CP24: 120.0000 mg | ORAL_CAPSULE | Freq: Every day | ORAL | 3 refills | Status: DC

## 2015-09-28 NOTE — Addendum Note (Signed)
Addended by: Dede Query R on: 09/28/2015 05:37 PM   Modules accepted: Orders

## 2015-09-28 NOTE — Telephone Encounter (Signed)
Spoke w/ pt regarding lab results. He reports that his BP has improved greatly since Cardizem CD, but he has been taking 120 mg once daily instead of BID w/ good results. He would like to continue once daily dosing and let us know if readings start to climb.

## 2015-10-08 ENCOUNTER — Telehealth: Payer: Self-pay | Admitting: Cardiovascular Disease

## 2015-10-08 ENCOUNTER — Other Ambulatory Visit: Payer: Self-pay | Admitting: *Deleted

## 2015-10-08 MED ORDER — APIXABAN 5 MG PO TABS: 5.0000 mg | ORAL_TABLET | Freq: Two times a day (BID) | ORAL | 3 refills | Status: DC

## 2015-10-08 NOTE — Telephone Encounter (Signed)
Requested Prescriptions   Signed Prescriptions Disp Refills  . apixaban (ELIQUIS) 5 MG TABS tablet 180 tablet 3    Sig: Take 1 tablet (5 mg total) by mouth 2 (two) times daily.    Authorizing Provider: Minna Merritts    Ordering User: Britt Bottom

## 2015-10-08 NOTE — Telephone Encounter (Signed)
°*  STAT* If patient is at the pharmacy, call can be transferred to refill team.   1. Which medications need to be refilled? (please list name of each medication and dose if known) Eliquis  5 mg po bid   2. Which pharmacy/location (including street and city if local pharmacy) is medication to be sent to? [facility]   3. Do they need a 30 day or 90 day supply? [place]

## 2015-10-12 ENCOUNTER — Telehealth: Payer: Self-pay | Admitting: Gastroenterology

## 2015-10-13 NOTE — Telephone Encounter (Signed)
Pt is calling back states he has a few questions he needs to discuss with nurse. Best contact number is [phone].

## 2015-10-14 NOTE — Telephone Encounter (Signed)
Patient reports that he is having some abdominal pain. He is asking about the results of his last MRCP in 2015.  He will call back for any additional questions or concerns or if he wants an appt

## 2015-10-18 ENCOUNTER — Other Ambulatory Visit: Payer: Self-pay | Admitting: Cardiovascular Disease

## 2015-10-21 ENCOUNTER — Telehealth: Payer: Self-pay | Admitting: Cardiovascular Disease

## 2015-10-21 NOTE — Telephone Encounter (Signed)
Pt would like to get an order for labwork before his appt on 10/18. He states he has read the some of his medicaitons may affect his liver. He requests cholesterol , liver, kidney, a "full blown" labs. He states his wife works for [employer] and there is no charge for his labs. Please call to discuss.

## 2015-10-21 NOTE — Telephone Encounter (Signed)
Patient called in because he has an upcoming appointment and wanted to see if he could get his lab work done prior to his office visit on 10/26/15 at 08:40AM. He stated that he had reviewed medications online and is aware that some may cause liver damage and he wanted a full panel of labs to check this. Reviewed with him that he had labs previously done last month 09/15/15 and kidney function back in January. Let him know that when he sees Dr. Rockey Situ on Monday if he wants any labs then we can do them at that time and call him with results because he may not need any at this time. He verbalized understanding and is willing to wait and come see him first before having any done. He verbalized understanding of our conversation and he had no further questions at this time.

## 2015-10-26 ENCOUNTER — Telehealth: Payer: Self-pay | Admitting: Cardiovascular Disease

## 2015-10-26 ENCOUNTER — Encounter: Payer: Self-pay | Admitting: Cardiovascular Disease

## 2015-10-26 ENCOUNTER — Ambulatory Visit (INDEPENDENT_AMBULATORY_CARE_PROVIDER_SITE_OTHER): Payer: 59 | Admitting: Cardiovascular Disease

## 2015-10-26 VITALS — BP 158/84 | HR 89 | Ht 72.0 in | Wt 249.8 lb

## 2015-10-26 DIAGNOSIS — Z01812 Encounter for preprocedural laboratory examination: Secondary | ICD-10-CM

## 2015-10-26 DIAGNOSIS — I483 Typical atrial flutter: Secondary | ICD-10-CM

## 2015-10-26 DIAGNOSIS — I872 Venous insufficiency (chronic) (peripheral): Secondary | ICD-10-CM | POA: Diagnosis not present

## 2015-10-26 DIAGNOSIS — E7800 Pure hypercholesterolemia, unspecified: Secondary | ICD-10-CM

## 2015-10-26 DIAGNOSIS — I700 Atherosclerosis of aorta: Secondary | ICD-10-CM

## 2015-10-26 DIAGNOSIS — Z87898 Personal history of other specified conditions: Secondary | ICD-10-CM

## 2015-10-26 DIAGNOSIS — F1011 Alcohol abuse, in remission: Secondary | ICD-10-CM

## 2015-10-26 DIAGNOSIS — Z23 Encounter for immunization: Secondary | ICD-10-CM | POA: Diagnosis not present

## 2015-10-26 DIAGNOSIS — I2510 Atherosclerotic heart disease of native coronary artery without angina pectoris: Secondary | ICD-10-CM

## 2015-10-26 DIAGNOSIS — I10 Essential (primary) hypertension: Secondary | ICD-10-CM

## 2015-10-26 NOTE — Telephone Encounter (Addendum)
Rescheduled patients DCCV for Monday 11/02/15 at 08:30 AM and needs to arrive at 07:30AM that day. Reviewed information with patient and called over to [place] regarding preoperative labs. Patient wants to go there due to discount that he receives. He verbalized understanding and had no further questions at this time.

## 2015-10-26 NOTE — Telephone Encounter (Signed)
Patient called stating that his wife was off on Monday 11/02/15 and wants to try and get it for that day. He also needs pre procedure labs done as well. Orders placed for labwork and will try and reschedule procedure. Let him know that I would call him back to review all information once I get procedure date. He verbalized understanding and had no further questions at this time.

## 2015-10-26 NOTE — Progress Notes (Signed)
Cardiology Office Note  Date:  10/26/2015   ID:  Peter [name], DOB 02-28-1955, MRN [id number]  PCP:  Coral Spikes, DO   Chief Complaint  Patient presents with  . other    1 month f/u.    HPI:  [name] is a 60 year old gentleman  with long history of smoking, hypertension, daily alcohol drinking, prostate cancer , statin intolerance ,  who presents for follow-up of his coronary artery disease, hypertension, hyperlipidemia, follow-up of his atrial flutter  patient of Dr. Lacinda Axon History of underlying liver dysfunction, elevated total bilirubin History of alcohol abuse  In follow-up today, he feels well, occasional palpitations otherwise no complaints Still in atrial flutter on EKG Feels ok, no SOB, no swelling, Almost quit smoking, on chantix Blood pressure running between 120 up to 140, rarely runs low. No symptoms of orthostasis Has bulging disks, gets back pain  ON praluent given himself 2 doses, no side effects  EKG on today's visit shows atrial flutter with ventricular rate 89 bpm  Other past medical history Limited by back pain , was told he could get a "shot" "shaking" for 2 years since prostate seeds, stopped librium Still with heavy ETOH, previous notes indicating 4-5 beers per day Frequent nocturia Has been taking lisinopril 40 mg x 2, presumably because blood pressure was running high, now running out of his medication. Did not call our office   Previous stress test July 2016 showing no ischemia CT scan of the chest, CT coronary calcium score discussed with him in detail, images pulled up in the office. His scores close to 3000. He denies any anginal symptoms. Images show diffuse heavy calcification/calcified atherosclerosis in the LAD, diagonal branch, ostial and proximal RCA. No significant disease noted in the left circumflex.   he reports being debilitated when he was on Crestor, Lipitor He had diffuse muscle pain in his legs, has tried different doses, does  not want to retry a statin given the debilitating side effects. Feels it made it impossible to get around, on top of his arthritis, severe knee pain, it was just too much  Other past medical history reviewed  CT scan of the abdomen  shows scattered diffuse mild aortic atherosclerosis.   Lab work reviewed with him showing hemoglobin A1c 5.5 , total cholesterol more than 230    PMH:   has a past medical history of Abnormality of gait (09/03/2012); Alcohol abuse; Anxiety; Arthritis; Esophageal stricture; GERD (gastroesophageal reflux disease); Gout; Hyperlipidemia; Hypertension; Internal hemorrhoids; Lesion of ulnar nerve (09/03/2012); Murmur, cardiac; Palpitations; Prostate cancer ([place]) (2016); Seizures ([place]); and Tubular adenoma of colon (03/2008).  PSH:    Past Surgical History:  Procedure Laterality Date  . CATARACT EXTRACTION W/PHACO Right 07/03/2014   Procedure: CATARACT EXTRACTION PHACO AND INTRAOCULAR LENS PLACEMENT (IOC);  Surgeon: Lyla Glassing, MD;  Location: [facility] ORS;  Service: Ophthalmology;  Laterality: Right;  lot # [id number] H [place]: 00:32.3 AP% 9.1 CDE: 2.92  . CIRCUMCISION    . COLONOSCOPY    . ELBOW SURGERY    . EYE SURGERY    . HAND SURGERY Left   . SHOULDER SURGERY      Current Outpatient Prescriptions  Medication Sig Dispense Refill  . Alirocumab (PRALUENT) 75 MG/ML SOPN Inject 75 mg into the skin every 14 (fourteen) days. 2 pen 6  . apixaban (ELIQUIS) 5 MG TABS tablet Take 1 tablet (5 mg total) by mouth 2 (two) times daily. 180 tablet 3  . chlordiazePOXIDE (LIBRIUM) 25 MG capsule Take  25 mg by mouth once.     . colchicine 0.6 MG tablet Take 0.6 mg by mouth as needed (gout). For gout flare ups    . diltiazem (CARDIZEM CD) 120 MG 24 hr capsule Take 1 capsule (120 mg total) by mouth daily. 180 capsule 3  . esomeprazole (NEXIUM) 20 MG capsule Take 20 mg by mouth daily at 12 noon.    . hydrOXYzine (ATARAX/VISTARIL) 50 MG tablet Take 50 mg by mouth at bedtime. Reported  on 02/04/2015    . lisinopril (PRINIVIL,ZESTRIL) 40 MG tablet Take 1 tablet (40 mg total) by mouth daily. 90 tablet 3  . losartan (COZAAR) 100 MG tablet TAKE ONE TABLET BY MOUTH DAILY 30 tablet 6  . milk thistle 175 MG tablet Take 350 mg by mouth daily.     . Multiple Vitamin (MULTIVITAMIN) tablet Take 1 tablet by mouth daily. Centrum Plus -Take 1 daily    . Nebivolol HCl (BYSTOLIC) 20 MG TABS Take 2 tablets (40 mg total) by mouth 2 (two) times daily. 180 tablet 3  . tamsulosin (FLOMAX) 0.4 MG CAPS capsule Take 0.4 mg by mouth daily.     . traMADol (ULTRAM) 50 MG tablet     . triamcinolone cream (KENALOG) 0.1 %     . varenicline (CHANTIX CONTINUING MONTH PAK) 1 MG tablet Take 1 tablet (1 mg total) by mouth 2 (two) times daily. 60 tablet 6  . VOLTAREN 1 % GEL Apply 2 g topically as needed (knees).      No current facility-administered medications for this visit.      Allergies:   Crestor [rosuvastatin]; Hydrocodone bitartrate er; and Lipitor [atorvastatin]   Social History:  The patient  reports that he has been smoking Cigarettes.  He has a 6.00 pack-year smoking history. He has never used smokeless tobacco. He reports that he drinks about 7.2 oz of alcohol per week . He reports that he does not use drugs.   Family History:   family history includes Diabetes in his father; Hyperlipidemia in his father; Hypertension in his father and mother; Kidney disease in his father; Lung cancer in his father and mother.    Review of Systems: Review of Systems  Constitutional: Negative.   Respiratory: Negative.   Cardiovascular: Negative.   Gastrointestinal: Negative.   Musculoskeletal: Positive for back pain.  Neurological: Negative.   Psychiatric/Behavioral: Negative.   All other systems reviewed and are negative.    PHYSICAL EXAM: VS:  BP (!) 158/84 (BP Location: Left Arm, Patient Position: Sitting, Cuff Size: Normal)   Pulse 89   Ht 6' (1.829 m)   Wt 249 lb 12 oz (113.3 kg)   BMI 33.87  kg/m  , BMI Body mass index is 33.87 kg/m. GEN: Well nourished, well developed, in no acute distress  HEENT: normal  Neck: no JVD, carotid bruits, or masses Cardiac: RRR; no murmurs, rubs, or gallops,no edema  Respiratory:  clear to auscultation bilaterally, normal work of breathing GI: soft, nontender, nondistended, + BS MS: no deformity or atrophy  Skin: warm and dry, no rash Neuro:  Strength and sensation are intact Psych: euthymic mood, full affect    Recent Labs: 02/05/2015: BUN 11; Creatinine, Ser 0.75; Platelets 134; Potassium 4.6; Sodium 130; TSH 1.770 09/16/2015: ALT 32    Lipid Panel Lab Results  Component Value Date   CHOL 264 (H) 09/16/2015   HDL 36 (L) 09/16/2015   LDLCALC 174 (H) 09/16/2015   TRIG 270 (H) 09/16/2015  Wt Readings from Last 3 Encounters:  10/26/15 249 lb 12 oz (113.3 kg)  09/21/15 252 lb 8 oz (114.5 kg)  05/06/15 247 lb 12 oz (112.4 kg)       ASSESSMENT AND PLAN:   Coronary artery disease involving native coronary artery of native heart without angina pectoris - Plan: EKG 12-Lead Currently with no symptoms of angina, tolerating praluent  we'll recheck lipids in 2 months time Calcium score of 3000  Essential hypertension Blood pressure stable, recommended he continue his current medication regiment  Typical atrial flutter ([place]) Continues to run in atrial flutter, will Schedule a cardioversion at his convenience Recommended he stay on blood thinners twice a day, stay on beta blocker, calcium channel blocker . We did spend some time discussing various treatment options including medical management, ablation. He prefers to go with cardioversion  Alcohol induced fatty liver Stressed importance of alcohol cessation given elevated total bilirubin, fatty liver  Tobacco abuse Down to several cigarettes per day. Recommended cessation  Chantix is working for him   Total encounter time more than 25 minutes  Greater than 50% was spent in  counseling and coordination of care with the patient   Disposition:   F/U  1 month   Orders Placed This Encounter  Procedures  . Flu Vaccine QUAD 36+ mos IM  . EKG 12-Lead     Signed, Esmond Plants, M.D., Ph.D. 10/26/2015  [place], [place]

## 2015-10-26 NOTE — Patient Instructions (Addendum)
Medication Instructions:   No medication changes made  Labwork:  No new labs needed  Testing/Procedures:   We will schedule a cardioversion for atrial flutter   Follow-Up: It was a pleasure seeing you in the office today. Please call us if you have new issues that need to be addressed before your next appt.  [phone]  Your physician wants you to follow-up in: 1 month.    If you need a refill on your cardiac medications before your next appointment, please call your pharmacy.    You are scheduled for a Cardioversion on _Monday 11/02/15 _ with Dr._Gollan_ Please arrive at the [facility] at __07:30_ a.m. on the day of your procedure.  DIET INSTRUCTIONS:  Nothing to eat or drink after midnight except your medications with a sip of water.         1) Labs: _CBC, BMP, and PT/INR___  2) Medications:  YOU MAY TAKE ALL of your remaining medications with a small amount of water.  3) Must have a responsible person to drive you home.  4) Bring a current list of your medications and current insurance cards.    If you have any questions after you get home, please call the office at [phone]  It was a pleasure seeing you today here in the office. Please do not hesitate to give [place] a call back if you have any further questions. [place], BSN

## 2015-10-28 ENCOUNTER — Other Ambulatory Visit: Payer: Self-pay | Admitting: Cardiovascular Disease

## 2015-10-29 LAB — BASIC METABOLIC PANEL
BUN / CREAT RATIO: 13 (ref 9–20)
BUN: 10 mg/dL (ref 6–24)
CHLORIDE: 98 mmol/L (ref 96–106)
CO2: 21 mmol/L (ref 18–29)
Calcium: 9.5 mg/dL (ref 8.7–10.2)
Creatinine, Ser: 0.75 mg/dL — ABNORMAL LOW (ref 0.76–1.27)
GFR, EST AFRICAN AMERICAN: 116 mL/min/{1.73_m2} (ref >59)
GFR, EST NON AFRICAN AMERICAN: 100 mL/min/{1.73_m2} (ref >59)
Glucose: 127 mg/dL — ABNORMAL HIGH (ref 65–99)
POTASSIUM: 4.4 mmol/L (ref 3.5–5.2)
Sodium: 137 mmol/L (ref 134–144)

## 2015-10-29 LAB — CBC WITH DIFFERENTIAL/PLATELET
BASOS ABS: 0.1 10*3/uL (ref 0.0–0.2)
BASOS: 1 %
EOS (ABSOLUTE): 0.3 10*3/uL (ref 0.0–0.4)
Eos: 2 %
HEMATOCRIT: 37.9 % (ref 37.5–51.0)
Hemoglobin: 13.9 g/dL (ref 12.6–17.7)
Immature Grans (Abs): 0 10*3/uL (ref 0.0–0.1)
Immature Granulocytes: 0 %
LYMPHS ABS: 4.2 10*3/uL — AB (ref 0.7–3.1)
Lymphs: 37 %
MCH: 39.2 pg — AB (ref 26.6–33.0)
MCHC: 36.7 g/dL — ABNORMAL HIGH (ref 31.5–35.7)
MCV: 107 fL — AB (ref 79–97)
MONOS ABS: 1.1 10*3/uL — AB (ref 0.1–0.9)
Monocytes: 10 %
NEUTROS ABS: 5.6 10*3/uL (ref 1.4–7.0)
Neutrophils: 50 %
PLATELETS: 138 10*3/uL — AB (ref 150–379)
RBC: 3.55 x10E6/uL — ABNORMAL LOW (ref 4.14–5.80)
RDW: 13.8 % (ref 12.3–15.4)
WBC: 11.2 10*3/uL — ABNORMAL HIGH (ref 3.4–10.8)

## 2015-10-29 LAB — PROTIME-INR
INR: 1.3 — ABNORMAL HIGH (ref 0.8–1.2)
Prothrombin Time: 13.4 s — ABNORMAL HIGH (ref 9.1–12.0)

## 2015-10-30 ENCOUNTER — Telehealth: Payer: Self-pay | Admitting: Cardiovascular Disease

## 2015-10-30 ENCOUNTER — Other Ambulatory Visit: Payer: Self-pay | Admitting: Cardiovascular Disease

## 2015-10-30 NOTE — Telephone Encounter (Signed)
Spoke w/ pt.  Advised him that he will need to arrive for his DCCV on Monday @ 6:30 am, as his procedure is @ 7:30. His wife will be dropping him off and coming back after her own appt @ [facility].  He states that they should both be done at the same time.

## 2015-10-30 NOTE — Telephone Encounter (Signed)
Patient wanted to make sure it was ok for his wife to [facility] for [facility] and come back to pick him up after an appointment.   Patient notified to have wife check in at 2nd floor desk and let the staff know she has returned .

## 2015-11-02 ENCOUNTER — Ambulatory Visit: Payer: 59 | Admitting: Anesthesiology

## 2015-11-02 ENCOUNTER — Encounter
Admission: RE | Disposition: A | Discharge status: Home / Self Care | Payer: Self-pay | Source: Ambulatory Visit | Attending: Cardiovascular Disease

## 2015-11-02 ENCOUNTER — Encounter: Payer: Self-pay | Admitting: *Deleted

## 2015-11-02 ENCOUNTER — Ambulatory Visit
Admission: RE | Admit: 2015-11-02 | Discharge: 2015-11-02 | Disposition: A | Discharge status: Home / Self Care | Payer: 59 | Source: Ambulatory Visit | Attending: Cardiovascular Disease | Admitting: Cardiovascular Disease

## 2015-11-02 DIAGNOSIS — I2510 Atherosclerotic heart disease of native coronary artery without angina pectoris: Secondary | ICD-10-CM | POA: Insufficient documentation

## 2015-11-02 DIAGNOSIS — K219 Gastro-esophageal reflux disease without esophagitis: Secondary | ICD-10-CM | POA: Insufficient documentation

## 2015-11-02 DIAGNOSIS — I483 Typical atrial flutter: Secondary | ICD-10-CM | POA: Diagnosis present

## 2015-11-02 DIAGNOSIS — Z6833 Body mass index (BMI) 33.0-33.9, adult: Secondary | ICD-10-CM | POA: Insufficient documentation

## 2015-11-02 DIAGNOSIS — E785 Hyperlipidemia, unspecified: Secondary | ICD-10-CM | POA: Diagnosis not present

## 2015-11-02 DIAGNOSIS — Z8546 Personal history of malignant neoplasm of prostate: Secondary | ICD-10-CM | POA: Insufficient documentation

## 2015-11-02 DIAGNOSIS — I10 Essential (primary) hypertension: Secondary | ICD-10-CM | POA: Diagnosis not present

## 2015-11-02 DIAGNOSIS — K760 Fatty (change of) liver, not elsewhere classified: Secondary | ICD-10-CM | POA: Insufficient documentation

## 2015-11-02 DIAGNOSIS — F17210 Nicotine dependence, cigarettes, uncomplicated: Secondary | ICD-10-CM | POA: Diagnosis not present

## 2015-11-02 DIAGNOSIS — R0602 Shortness of breath: Secondary | ICD-10-CM | POA: Diagnosis not present

## 2015-11-02 DIAGNOSIS — Z7901 Long term (current) use of anticoagulants: Secondary | ICD-10-CM | POA: Insufficient documentation

## 2015-11-02 DIAGNOSIS — Z79899 Other long term (current) drug therapy: Secondary | ICD-10-CM | POA: Diagnosis not present

## 2015-11-02 DIAGNOSIS — E669 Obesity, unspecified: Secondary | ICD-10-CM | POA: Diagnosis not present

## 2015-11-02 DIAGNOSIS — M109 Gout, unspecified: Secondary | ICD-10-CM | POA: Insufficient documentation

## 2015-11-02 HISTORY — PX: ELECTROPHYSIOLOGIC STUDY: SHX172A

## 2015-11-02 SURGERY — CARDIOVERSION (CATH LAB)
Anesthesia: General | Laterality: Bilateral

## 2015-11-02 SURGERY — CARDIOVERSION (CATH LAB)
Anesthesia: General

## 2015-11-02 MED ORDER — PROPOFOL 10 MG/ML IV BOLUS
INTRAVENOUS | Status: DC | PRN
  Administered 2015-11-02: 160 mg via INTRAVENOUS

## 2015-11-02 MED ORDER — SODIUM CHLORIDE 0.9 % IV SOLN
INTRAVENOUS | Status: DC | PRN
  Administered 2015-11-02: 07:00:00 via INTRAVENOUS

## 2015-11-02 NOTE — CV Procedure (Signed)
Cardioversion procedure note For atrial flutter, typical  Procedure Details:  Consent: Risks of procedure as well as the alternatives and risks of each were explained to the (patient/caregiver). Consent for procedure obtained.  Time Out: Verified patient identification, verified procedure, site/side was marked, verified correct patient position, special equipment/implants available, medications/allergies/relevent history reviewed, required imaging and test results available. Performed  Patient placed on cardiac monitor, pulse oximetry, supplemental oxygen as necessary.  Sedation given: propofol IV, Dr. Randa [name] Pacer pads placed anterior and posterior chest.   Cardioverted 1 time(s).  Cardioverted at  150 J. Synchronized biphasic Converted to NSR   Evaluation: Findings: Post procedure EKG shows: NSR Complications: None Patient did tolerate procedure well.  Time Spent Directly with the Patient:  56 minutes   Esmond Plants, M.D., Ph.D.

## 2015-11-02 NOTE — Anesthesia Postprocedure Evaluation (Signed)
Anesthesia Post Note  Patient: Peter [name]  Procedure(s) Performed: Procedure(s) (LRB): CARDIOVERSION (N/A)  Patient location during evaluation: Other Anesthesia Type: General Level of consciousness: awake and alert and oriented Pain management: pain level controlled Vital Signs Assessment: post-procedure vital signs reviewed and stable Respiratory status: spontaneous breathing, nonlabored ventilation and respiratory function stable Cardiovascular status: blood pressure returned to baseline and stable Postop Assessment: no signs of nausea or vomiting Anesthetic complications: no    Last Vitals:  Vitals:   11/02/15 0800 11/02/15 0818  BP: 123/72 129/75  Pulse: 63 70  Resp: 18 19  Temp:      Last Pain:  Vitals:   11/02/15 0655  PainSc: 1                  Sylvanus Telford

## 2015-11-02 NOTE — Anesthesia Preprocedure Evaluation (Addendum)
Anesthesia Evaluation  Patient identified by MRN, date of birth, ID band Patient awake    Reviewed: Allergy & Precautions, NPO status , Patient's Chart, lab work & pertinent test results, reviewed documented beta blocker date and time   History of Anesthesia Complications Negative for: history of anesthetic complications  Airway Mallampati: II  TM Distance: >3 FB Neck ROM: Full    Dental  (+) Implants   Pulmonary neg sleep apnea, neg COPD, Current Smoker,    breath sounds clear to auscultation- rhonchi (-) wheezing      Cardiovascular hypertension, Pt. on medications and Pt. on home beta blockers + CAD  (-) Past MI, (-) Cardiac Stents and (-) CABG  Rhythm:Irregular Rate:Normal - Systolic murmurs and - Diastolic murmurs NM stress test 08/07/14:  Nuclear stress EF: 64%.  There was no ST segment deviation noted during stress.  This is a low risk study.  The left ventricular ejection fraction is normal (55-65%).   Small, mild partially reversible basal to mid inferolateral defect.  This could be suggestive of ischemia but would also consider soft tissue attenuation.  EF 64% with normal wall motion.   Neuro/Psych neg Seizures    GI/Hepatic negative GI ROS, NAFLD   Endo/Other  negative endocrine ROSneg diabetes  Renal/GU negative Renal ROS     Musculoskeletal  (+) Arthritis , Osteoarthritis,    Abdominal (+) + obese,   Peds  Hematology negative hematology ROS (+)   Anesthesia Other Findings Past Medical History: 09/03/2012: Abnormality of gait No date: Alcohol abuse No date: Anxiety No date: Arthritis No date: Esophageal stricture No date: GERD (gastroesophageal reflux disease) No date: Gout No date: Hyperlipidemia No date: Hypertension No date: Internal hemorrhoids 09/03/2012: Lesion of ulnar nerve     Comment: Right ulnar neuropathy No date: Murmur, cardiac No date: Palpitations 2016: Prostate cancer  ([place])     Comment: treated with radioactive seed implant 03/2008: Tubular adenoma of colon   Reproductive/Obstetrics                            Anesthesia Physical Anesthesia Plan  ASA: III  Anesthesia Plan: General   Post-op Pain Management:    Induction: Intravenous  Airway Management Planned: Natural Airway  Additional Equipment:   Intra-op Plan:   Post-operative Plan:   Informed Consent: I have reviewed the patients History and Physical, chart, labs and discussed the procedure including the risks, benefits and alternatives for the proposed anesthesia with the patient or authorized representative who has indicated his/her understanding and acceptance.   Dental advisory given  Plan Discussed with: CRNA and Anesthesiologist  Anesthesia Plan Comments:         Anesthesia Quick Evaluation

## 2015-11-02 NOTE — Discharge Instructions (Signed)
Electrical Cardioversion, Care After °Refer to this sheet in the next few weeks. These instructions provide you with information on caring for yourself after your procedure. Your health care provider may also give you more specific instructions. Your treatment has been planned according to current medical practices, but problems sometimes occur. Call your health care provider if you have any problems or questions after your procedure. °WHAT TO EXPECT AFTER THE PROCEDURE °After your procedure, it is typical to have the following sensations: °· Some redness on the skin where the shocks were delivered. If this is tender, a sunburn lotion or hydrocortisone cream may help. °· Possible return of an abnormal heart rhythm within hours or days after the procedure. °HOME CARE INSTRUCTIONS °· Take medicines only as directed by your health care provider. Be sure you understand how and when to take your medicine. °· Learn how to feel your pulse and check it often. °· Limit your activity for 48 hours after the procedure or as directed by your health care provider. °· Avoid or minimize caffeine and other stimulants as directed by your health care provider. °SEEK MEDICAL CARE IF: °· You feel like your heart is beating too fast or your pulse is not regular. °· You have any questions about your medicines. °· You have bleeding that will not stop. °SEEK IMMEDIATE MEDICAL CARE IF: °· You are dizzy or feel faint. °· It is hard to breathe or you feel short of breath. °· There is a change in discomfort in your chest. °· Your speech is slurred or you have trouble moving an arm or leg on one side of your body. °· You get a serious muscle cramp that does not go away. °· Your fingers or toes turn cold or blue. °  °This information is not intended to replace advice given to you by your health care provider. Make sure you discuss any questions you have with your health care provider. °  °Document Released: 10/17/2012 Document Revised: 01/17/2014  Document Reviewed: 10/17/2012 °Elsevier Interactive Patient Education ©2016 [employer] ° °

## 2015-11-02 NOTE — Transfer of Care (Signed)
Immediate Anesthesia Transfer of Care Note  Patient: Peter [name]  Procedure(s) Performed: Procedure(s): CARDIOVERSION (N/A)  Patient Location: PACU  Anesthesia Type:General  Level of Consciousness: sedated  Airway & Oxygen Therapy: Patient connected to nasal cannula oxygen  Post-op Assessment: Report given to RN and Post -op Vital signs reviewed and stable  Post vital signs: Reviewed and stable  Last Vitals:  Vitals:   11/02/15 0744 11/02/15 0745  BP:  123/72  Pulse: (!) 53 64  Resp: 19 15  Temp:      Last Pain:  Vitals:   11/02/15 0655  PainSc: 1          Complications: No apparent anesthesia complications

## 2015-11-02 NOTE — Progress Notes (Signed)
Pt clinically stable in sinus rhythm post cardioversion, Dr Rockey Situ in to speak with pt. Taking po's without difficulty,denies complaints, alert and oriented

## 2015-11-03 ENCOUNTER — Encounter: Payer: Self-pay | Admitting: Cardiovascular Disease

## 2015-11-04 ENCOUNTER — Telehealth: Payer: Self-pay | Admitting: Cardiovascular Disease

## 2015-11-04 NOTE — Telephone Encounter (Signed)
Pt is asking if we call back with the labs he did last week  Please call back

## 2015-11-04 NOTE — Telephone Encounter (Signed)
Please see result note 

## 2015-11-10 ENCOUNTER — Other Ambulatory Visit: Payer: Self-pay

## 2015-11-16 ENCOUNTER — Telehealth: Payer: Self-pay | Admitting: Cardiovascular Disease

## 2015-11-16 NOTE — Telephone Encounter (Signed)
Pt is calling states he received a call from administrative services that he needed an order for labwork for his cholesterol to prove that "this pen" he needs for his cholesterol. Please call.

## 2015-11-16 NOTE — Telephone Encounter (Signed)
Spoke w/ pt.  Clarified that he is to have liver & lipids drawn for his Praluent. He will go to [facility] one morning, fasting, for labs. Advised him that I have already faxed the order over.  Asked him to call back w/ any questions or concerns.

## 2015-11-17 ENCOUNTER — Telehealth: Payer: Self-pay | Admitting: *Deleted

## 2015-11-17 NOTE — Telephone Encounter (Signed)
They have contacted [place] 3 times about this. Pt needs labs drawn on Praluent. He has these scheduled already. Will resend prior authorization once we have updated labs.

## 2015-11-17 NOTE — Telephone Encounter (Signed)
Pt requiring PA for Praluent 75 mg/ml pen. Please contact [facility] for Prior authorization.

## 2015-11-19 ENCOUNTER — Telehealth: Payer: Self-pay | Admitting: Gastroenterology

## 2015-11-19 NOTE — Telephone Encounter (Signed)
Patient is requesting an MRI of his pancreas. Patient reports that he is having LLQ and low back pain.  He reports a palpable "olive sized" bulge I can feel about 2 inches off my spine.  Pain improves with Voltaren Gel to the area and Tramadol.  Last MRI was in Dec 2015 that showed a pancreatic cyst.   Notes Recorded by Ladene Artist, MD on 01/10/2014 at 12:36 PM Pancreatic cyst is smaller-imaging suggests a benign lesion, likely a pseudocyst Mild hepatic steatosis No further imaging is recommended  Patient feels that his pain is related to the pancreatic cyst.  I explained that this is highly unlikely given his symptoms.  He is advised that he would need an office visit to evaluate his LLQ pain.  Last office visit was in 04/2013.  He has no history of diverticulosis per last colonoscopy 03/2013.  Patient declines offer for office visit.  He states he lives in [place] and feels that he will start with his orthopedic doctor.  He understands to call back if LLQ pain is not explained by orthopedic MD or his primary care.

## 2015-11-19 NOTE — Telephone Encounter (Signed)
Orthopedic and PCP evaluations would be appropriate. GI evaluation if cause of LLQ pain is not found.

## 2015-11-20 ENCOUNTER — Encounter: Payer: Self-pay | Admitting: Cardiovascular Disease

## 2015-11-20 ENCOUNTER — Other Ambulatory Visit: Payer: Self-pay | Admitting: Cardiovascular Disease

## 2015-11-21 LAB — HEPATIC FUNCTION PANEL
ALBUMIN: 3.8 g/dL (ref 3.5–5.5)
ALK PHOS: 92 IU/L (ref 39–117)
ALT: 29 IU/L (ref 0–44)
AST: 60 IU/L — AB (ref 0–40)
BILIRUBIN, DIRECT: 0.63 mg/dL — AB (ref 0.00–0.40)
Bilirubin Total: 1.9 mg/dL — ABNORMAL HIGH (ref 0.0–1.2)
TOTAL PROTEIN: 7.3 g/dL (ref 6.0–8.5)

## 2015-11-21 LAB — LIPID PANEL W/O CHOL/HDL RATIO
Cholesterol, Total: 161 mg/dL (ref 100–199)
HDL: 46 mg/dL (ref >39)
LDL Calculated: 86 mg/dL (ref 0–99)
TRIGLYCERIDES: 143 mg/dL (ref 0–149)
VLDL Cholesterol Cal: 29 mg/dL (ref 5–40)

## 2015-11-22 ENCOUNTER — Emergency Department
Admission: EM | Admit: 2015-11-22 | Discharge: 2015-11-22 | Disposition: A | Discharge status: Home / Self Care | Payer: 59 | Attending: Emergency Medicine | Admitting: Emergency Medicine

## 2015-11-22 DIAGNOSIS — M62830 Muscle spasm of back: Secondary | ICD-10-CM | POA: Diagnosis not present

## 2015-11-22 DIAGNOSIS — F17210 Nicotine dependence, cigarettes, uncomplicated: Secondary | ICD-10-CM | POA: Diagnosis not present

## 2015-11-22 DIAGNOSIS — I10 Essential (primary) hypertension: Secondary | ICD-10-CM | POA: Diagnosis not present

## 2015-11-22 DIAGNOSIS — M5442 Lumbago with sciatica, left side: Secondary | ICD-10-CM | POA: Diagnosis not present

## 2015-11-22 DIAGNOSIS — I2510 Atherosclerotic heart disease of native coronary artery without angina pectoris: Secondary | ICD-10-CM | POA: Insufficient documentation

## 2015-11-22 DIAGNOSIS — G8929 Other chronic pain: Secondary | ICD-10-CM | POA: Diagnosis not present

## 2015-11-22 DIAGNOSIS — Z8546 Personal history of malignant neoplasm of prostate: Secondary | ICD-10-CM | POA: Insufficient documentation

## 2015-11-22 DIAGNOSIS — M545 Low back pain: Secondary | ICD-10-CM | POA: Diagnosis present

## 2015-11-22 DIAGNOSIS — Z79899 Other long term (current) drug therapy: Secondary | ICD-10-CM | POA: Insufficient documentation

## 2015-11-22 LAB — URINALYSIS COMPLETE WITH MICROSCOPIC (ARMC ONLY)
BACTERIA UA: NONE SEEN
Bilirubin Urine: NEGATIVE
Glucose, UA: NEGATIVE mg/dL
HGB URINE DIPSTICK: NEGATIVE
Ketones, ur: NEGATIVE mg/dL
LEUKOCYTES UA: NEGATIVE
NITRITE: NEGATIVE
PROTEIN: NEGATIVE mg/dL
SPECIFIC GRAVITY, URINE: 1.002 — AB (ref 1.005–1.030)
SQUAMOUS EPITHELIAL / LPF: NONE SEEN
pH: 6 (ref 5.0–8.0)

## 2015-11-22 MED ORDER — CYCLOBENZAPRINE HCL 10 MG PO TABS: 10.0000 mg | ORAL_TABLET | Freq: Three times a day (TID) | ORAL | 0 refills | Status: DC | PRN

## 2015-11-22 NOTE — ED Triage Notes (Addendum)
Patient presents to the ED with left lower back pain x 2 weeks.  Patient states, "I went to my doctor and he told me it was probably arthritis and I told him I wanted to try to tough it out and he gave me tramodol but it ain't touching it and last night I couldn't sleep."  Patient states laying down makes back pain worse but sitting and moving around improves pain.  Patient denies any urinary issues. Patient denies history of kidney stones.

## 2015-11-22 NOTE — ED Notes (Signed)

## 2015-11-22 NOTE — ED Provider Notes (Signed)
[facility] Emergency Department Provider Note  ____________________________________________  Time seen: Approximately 10:34 AM  I have reviewed the triage vital signs and the nursing notes.   HISTORY  Chief Complaint Back Pain    HPI Peter [name] is a 60 y.o. male, NAD, presents to the emergency department with 3 day history of worsening left lower back pain.  States he has a history of intermittent chronic low back pain with sciatica components in which she sees an orthopedic as well as a physiatry strep. Is given tramadol to use as needed for pain andhe has been taking 2 tablets daily over the last 3 days without alleviation of pain. States the pain has been worsening over the last few months but the pain is now causing him to lose sleep over the last 2 days.  Was told he had arthritis in his low back. Unfortunately cannot take aspirin or NSAIDs due to being on chronic blood thinners. Has not taken any Tylenol in conjunction with the tramadol. Denies saddle paresthesias or loss of bowel or bladder control. Has had no dysuria, hematuria, urinary frequency/urgency, abdominal pain, nausea, vomiting. Denies fevers, chills, body aches. Has had no chest pain or shortness of breath. Does have a history of atrial fibrillation but states he has had no fluttering or palpitations at this time. Denies any new injuries, traumas or falls.    Past Medical History:  Diagnosis Date  . Abnormality of gait 09/03/2012  . Alcohol abuse   . Anxiety   . Arthritis   . Esophageal stricture   . GERD (gastroesophageal reflux disease)   . Gout   . Hyperlipidemia   . Hypertension   . Internal hemorrhoids   . Lesion of ulnar nerve 09/03/2012   Right ulnar neuropathy  . Murmur, cardiac   . Palpitations   . Prostate cancer ([place]) 2016   treated with radioactive seed implant  . Seizures ([place])    pt states he had seizure 12/14  . Tubular adenoma of colon 03/2008    Patient Active  Problem List   Diagnosis Date Noted  . Typical atrial flutter ([place]) 09/21/2015  . CAD (coronary artery disease) 05/06/2015  . Aortic atherosclerosis ([place]) 03/20/2015  . History of alcohol abuse 02/04/2015  . Alcohol induced fatty liver 02/04/2015  . Obesity (BMI 30.0-34.9) 02/04/2015  . Back pain 02/04/2015  . History of prostate cancer 02/04/2015  . Preventative health care 02/04/2015  . Leg pain 02/04/2015  . Chronic venous insufficiency 10/12/2012  . Lesion of ulnar nerve 09/03/2012  . Tobacco abuse 04/05/2011  . Hyperlipidemia 01/21/2010  . Essential hypertension 12/16/2009  . Premature beats 12/16/2009    Past Surgical History:  Procedure Laterality Date  . CATARACT EXTRACTION W/PHACO Right 07/03/2014   Procedure: CATARACT EXTRACTION PHACO AND INTRAOCULAR LENS PLACEMENT (IOC);  Surgeon: Lyla Glassing, MD;  Location: [facility] ORS;  Service: Ophthalmology;  Laterality: Right;  lot # [id number] H [place]: 00:32.3 AP% 9.1 CDE: 2.92  . CIRCUMCISION    . COLONOSCOPY    . ELBOW SURGERY    . ELECTROPHYSIOLOGIC STUDY N/A 11/02/2015   Procedure: CARDIOVERSION;  Surgeon: Minna Merritts, MD;  Location: [facility] ORS;  Service: Cardiovascular;  Laterality: N/A;  . EYE SURGERY    . HAND SURGERY Left   . SHOULDER SURGERY      Prior to Admission medications   Medication Sig Start Date End Date Taking? Authorizing Provider  Alirocumab (PRALUENT) 75 MG/ML SOPN Inject 75 mg into the skin every 14 (  fourteen) days. 06/02/15   Minna Merritts, MD  apixaban (ELIQUIS) 5 MG TABS tablet Take 1 tablet (5 mg total) by mouth 2 (two) times daily. 10/08/15   Minna Merritts, MD  chlordiazePOXIDE (LIBRIUM) 25 MG capsule Take 25 mg by mouth once.     Historical Provider, MD  colchicine 0.6 MG tablet Take 0.6 mg by mouth as needed (gout). For gout flare ups    Historical Provider, MD  cyclobenzaprine (FLEXERIL) 10 MG tablet Take 1 tablet (10 mg total) by mouth 3 (three) times daily as needed for muscle spasms.  11/22/15   Court Gracia L Aditri Louischarles, PA-C  diltiazem (CARDIZEM CD) 120 MG 24 hr capsule Take 1 capsule (120 mg total) by mouth daily. 09/28/15 09/27/16  Ryan M Dunn, PA-C  esomeprazole (NEXIUM) 20 MG capsule Take 20 mg by mouth daily at 12 noon.    Historical Provider, MD  hydrOXYzine (ATARAX/VISTARIL) 50 MG tablet Take 50 mg by mouth at bedtime. Reported on 02/04/2015    Historical Provider, MD  lisinopril (PRINIVIL,ZESTRIL) 40 MG tablet Take 1 tablet (40 mg total) by mouth daily. 07/16/15   Minna Merritts, MD  losartan (COZAAR) 100 MG tablet TAKE ONE TABLET BY MOUTH DAILY 10/19/15   Minna Merritts, MD  milk thistle 175 MG tablet Take 350 mg by mouth daily.     Historical Provider, MD  Multiple Vitamin (MULTIVITAMIN) tablet Take 1 tablet by mouth daily. Centrum Plus -Take 1 daily    Historical Provider, MD  Nebivolol HCl (BYSTOLIC) 20 MG TABS Take 2 tablets (40 mg total) by mouth 2 (two) times daily. 09/21/15   Minna Merritts, MD  tamsulosin (FLOMAX) 0.4 MG CAPS capsule Take 0.4 mg by mouth daily.     Historical Provider, MD  traMADol Veatrice Bourbon) 50 MG tablet  01/29/15   Historical Provider, MD  triamcinolone cream (KENALOG) 0.1 %  12/24/14   Historical Provider, MD  varenicline (CHANTIX CONTINUING MONTH PAK) 1 MG tablet Take 1 tablet (1 mg total) by mouth 2 (two) times daily. 06/15/15   Minna Merritts, MD  VOLTAREN 1 % GEL Apply 2 g topically as needed (knees).  04/02/13   Historical Provider, MD    Allergies Crestor [rosuvastatin]; Hydrocodone bitartrate er; and Lipitor [atorvastatin]  Family History  Problem Relation Age of Onset  . Diabetes Father   . Kidney disease Father   . Lung cancer Father     smoker  . Hyperlipidemia Father   . Hypertension Father   . Lung cancer Mother     smoke  . Hypertension Mother   . Colon cancer Neg Hx   . Pancreatic cancer Neg Hx   . Stomach cancer Neg Hx     Social History Social History  Substance Use Topics  . Smoking status: Current Every Day Smoker     Packs/day: 0.25    Years: 24.00    Types: Cigarettes  . Smokeless tobacco: Never Used  . Alcohol use 7.2 oz/week    12 Cans of beer per week     Comment: 4 per day, history of alcohol abuse     Review of Systems  Constitutional: No fever/chills Cardiovascular: No chest pain, fluttering, palpitations. Respiratory: No shortness of breath. No wheezing.  Gastrointestinal:   No abdominal pain. No nausea, vomiting.   Genitourinary: Negative for dysuria, hematuria. No urinary hesitancy, urgency or increased frequency. Musculoskeletal: Positive left lower back pain. No neck pain. Skin: Negative for rash, redness, skin sores. Neurological: Negative for  numbness, weakness, tingling. No saddle paresthesias or loss of bowel or bladder control. 10-point ROS otherwise negative.  ____________________________________________   PHYSICAL EXAM:  VITAL SIGNS: ED Triage Vitals  Enc Vitals Group     BP 11/22/15 1004 (!) 153/72     Pulse Rate 11/22/15 1004 66     Resp 11/22/15 1004 18     Temp 11/22/15 1004 97.7 F (36.5 C)     Temp Source 11/22/15 1004 Oral     SpO2 11/22/15 1004 94 %     Weight 11/22/15 1007 250 lb (113.4 kg)     Height 11/22/15 1007 6' (1.829 m)     Head Circumference --      Peak Flow --      Pain Score 11/22/15 1008 3     Pain Loc --      Pain Edu? --      Excl. in [employer]? --      Constitutional: Alert and oriented. Well appearing and in no acute distress. Eyes: Conjunctivae are normal.  Head: Atraumatic. Neck:  Supple with full range of motion Hematological/Lymphatic/Immunilogical: No cervical lymphadenopathy. Cardiovascular: Normal rate, regular rhythm. Normal S1 and S2.  Good peripheral circulation. Respiratory: Normal respiratory effort without tachypnea or retractions. Lungs CTAB with breath sounds noted in all lung fields. No wheeze, rhonchi, rales. Musculoskeletal: No tenderness to palpation about the midline thoracic, lumbar or sacral spine without step-offs  or deformity. Muscle spasm is noted about the left lower thoracic and left lower lumbar regions with mild tenderness to palpation. No lower extremity tenderness nor edema.  No joint effusions. Neurologic:  Gait and posture or normal. Normal speech and language. No gross focal neurologic deficits are appreciated.  Skin:  Skin is warm, dry and intact. No rash on the skin sores, redness, swelling, bruising noted. Psychiatric: Mood and affect are normal. Speech and behavior are normal. Patient exhibits appropriate insight and judgement.   ____________________________________________   LABS (all labs ordered are listed, but only abnormal results are displayed)  Labs Reviewed  URINALYSIS COMPLETEWITH MICROSCOPIC ([place] ONLY) - Abnormal; Notable for the following:       Result Value   Color, Urine STRAW (*)    APPearance CLEAR (*)    Specific Gravity, Urine 1.002 (*)    All other components within normal limits   ____________________________________________  EKG  None ____________________________________________  RADIOLOGY  None ____________________________________________    PROCEDURES  Procedure(s) performed: None   Procedures   Medications - No data to display   ____________________________________________   INITIAL IMPRESSION / ASSESSMENT AND PLAN / ED COURSE  Pertinent labs & imaging results that were available during my care of the patient were reviewed by me and considered in my medical decision making (see chart for details).  Clinical Course as of Nov 21 1217  Nancy Fetter Nov 22, 2015  [place] controlled substance database was accessed and patient has been receiving tramadol prescriptions with refills since 04/20/2015. Dr. Vonzella Nipple has been providing these prescriptions. Last prescription fill was 11/18/2015 for tramadol 50 mg #60 tablets.  [JH]    Clinical Course User Index [JH] Tauri Ethington L Leslyn Monda, PA-C    Patient's diagnosis is consistent with  chronic bilateral lower back pain with left sided sciatica and muscle spasm of the back. Patient will be discharged home with prescriptions for flexeril to take as directed in conjunction with Tramadol. Patient is to follow up with Dr. Vonzella Nipple if symptoms persist past this treatment course. Patient is given  ED precautions to return to the ED for any worsening or new symptoms.    ____________________________________________  FINAL CLINICAL IMPRESSION(S) / ED DIAGNOSES  Final diagnoses:  Chronic bilateral low back pain with left-sided sciatica  Muscle spasm of back      NEW MEDICATIONS STARTED DURING THIS VISIT:  Discharge Medication List as of 11/22/2015 11:54 AM    START taking these medications   Details  cyclobenzaprine (FLEXERIL) 10 MG tablet Take 1 tablet (10 mg total) by mouth 3 (three) times daily as needed for muscle spasms., Starting Sun 11/22/2015, Print             Braxton Feathers, PA-C 11/22/15 [place], MD 11/22/15 [phone]

## 2015-11-27 ENCOUNTER — Ambulatory Visit (INDEPENDENT_AMBULATORY_CARE_PROVIDER_SITE_OTHER): Payer: 59 | Admitting: Cardiovascular Disease

## 2015-11-27 ENCOUNTER — Encounter: Payer: Self-pay | Admitting: Cardiovascular Disease

## 2015-11-27 VITALS — BP 158/78 | HR 55 | Ht 72.0 in | Wt 254.8 lb

## 2015-11-27 DIAGNOSIS — I10 Essential (primary) hypertension: Secondary | ICD-10-CM

## 2015-11-27 DIAGNOSIS — I483 Typical atrial flutter: Secondary | ICD-10-CM | POA: Diagnosis not present

## 2015-11-27 DIAGNOSIS — I2510 Atherosclerotic heart disease of native coronary artery without angina pectoris: Secondary | ICD-10-CM | POA: Diagnosis not present

## 2015-11-27 MED ORDER — NEBIVOLOL HCL 20 MG PO TABS: 40.0000 mg | ORAL_TABLET | Freq: Two times a day (BID) | ORAL | 3 refills | Status: DC

## 2015-11-27 MED ORDER — ALIROCUMAB 75 MG/ML ~~LOC~~ SOPN: 150.0000 mg | PEN_INJECTOR | SUBCUTANEOUS | 12 refills | Status: DC

## 2015-11-27 NOTE — Patient Instructions (Addendum)
Medication Instructions:   We will increase the praluent up to 150 mg twice a month  bystolic coupon  Move some blood pressure meds to dinner or before bed   Labwork:  No new labs needed  Testing/Procedures:  No further testing at this time   I recommend watching educational videos on topics of interest to you at:       [email or web address]  Enter code: [email or web address]    Follow-Up: It was a pleasure seeing you in the office today. Please call us if you have new issues that need to be addressed before your next appt.  [phone]  Your physician wants you to follow-up in: 6 months.  You will receive a reminder letter in the mail two months in advance. If you don't receive a letter, please call our office to schedule the follow-up appointment.  If you need a refill on your cardiac medications before your next appointment, please call your pharmacy.

## 2015-11-27 NOTE — Progress Notes (Signed)
Cardiology Office Note  Date:  11/27/2015   ID:  SHRI ENFINGER, DOB 12/05/1955, MRN [id number]  PCP:  Coral Spikes, DO   Chief Complaint  Patient presents with  . other    21m f/u post cardioversion for atrial flutter on 11/02/15. Pt states he is doing well. Reviewed meds with pt verbally.     HPI:  [name] is a 60 year old gentleman  with long history of smoking, hypertension, daily alcohol drinking, prostate cancer , statin intolerance ,  who presents for follow-up of his coronary artery disease, hypertension, hyperlipidemia, follow-up of his atrial flutter History of underlying liver dysfunction, elevated total bilirubin CT coronary calcium score 2778 in 04/2015 Stress test 07/2014 no ischemia Cardioversion 11/02/2015 For atrial flutter  In follow-up today, he reports that he continues to have back pain He went to the ER for back pain 11/22/2015 Started On tramadol PRN  He is tolerating praluent , dramatic improvement in his cholesterol down to 160 from 260  Down to 2 cigs a day Taking chantix  Denies any chest pain concerning for angina EKG on today's visit shows sinus bradycardia, no significant ST or T-wave changes  Other past medical history Limited by back pain , was told he could get a "shot" "shaking" for 2 years since prostate seeds, stopped librium Still with heavy ETOH, previous notes indicating 4-5 beers per day Frequent nocturia Has been taking lisinopril 40 mg x 2, presumably because blood pressure was running high, now running out of his medication. Did not call our office   Previous stress test July 2016 showing no ischemia CT scan of the chest, CT coronary calcium score discussed with him in detail, images pulled up in the office. His scores close to 3000. He denies any anginal symptoms. Images show diffuse heavy calcification/calcified atherosclerosis in the LAD, diagonal branch, ostial and proximal RCA. No significant disease noted in the left  circumflex.   he reports being debilitated when he was on Crestor, Lipitor He had diffuse muscle pain in his legs, has tried different doses, does not want to retry a statin given the debilitating side effects. Feels it made it impossible to get around, on top of his arthritis, severe knee pain, it was just too much  Other past medical history reviewed  CT scan of the abdomen  shows scattered diffuse mild aortic atherosclerosis.   Lab work reviewed with him showing hemoglobin A1c 5.5 , total cholesterol more than 230    PMH:   has a past medical history of Abnormality of gait (09/03/2012); Alcohol abuse; Anxiety; Arthritis; Arthritis; Esophageal stricture; GERD (gastroesophageal reflux disease); Gout; Hyperlipidemia; Hypertension; Internal hemorrhoids; Lesion of ulnar nerve (09/03/2012); Murmur, cardiac; Palpitations; Prostate cancer ([place]) (2016); Seizures ([place]); and Tubular adenoma of colon (03/2008).  PSH:    Past Surgical History:  Procedure Laterality Date  . CATARACT EXTRACTION W/PHACO Right 07/03/2014   Procedure: CATARACT EXTRACTION PHACO AND INTRAOCULAR LENS PLACEMENT (IOC);  Surgeon: Lyla Glassing, MD;  Location: [facility] ORS;  Service: Ophthalmology;  Laterality: Right;  lot # [id number] H [place]: 00:32.3 AP% 9.1 CDE: 2.92  . CIRCUMCISION    . COLONOSCOPY    . ELBOW SURGERY    . ELECTROPHYSIOLOGIC STUDY N/A 11/02/2015   Procedure: CARDIOVERSION;  Surgeon: Minna Merritts, MD;  Location: [facility] ORS;  Service: Cardiovascular;  Laterality: N/A;  . EYE SURGERY    . HAND SURGERY Left   . SHOULDER SURGERY      Current Outpatient Prescriptions  Medication Sig Dispense  Refill  . Alirocumab (PRALUENT) 75 MG/ML SOPN Inject 75 mg into the skin every 14 (fourteen) days. 2 pen 6  . apixaban (ELIQUIS) 5 MG TABS tablet Take 1 tablet (5 mg total) by mouth 2 (two) times daily. 180 tablet 3  . chlordiazePOXIDE (LIBRIUM) 25 MG capsule Take 25 mg by mouth once.     . colchicine 0.6 MG tablet Take  0.6 mg by mouth as needed (gout). For gout flare ups    . cyclobenzaprine (FLEXERIL) 10 MG tablet Take 1 tablet (10 mg total) by mouth 3 (three) times daily as needed for muscle spasms. 21 tablet 0  . diltiazem (CARDIZEM CD) 120 MG 24 hr capsule Take 1 capsule (120 mg total) by mouth daily. 180 capsule 3  . esomeprazole (NEXIUM) 20 MG capsule Take 20 mg by mouth daily at 12 noon.    . hydrOXYzine (ATARAX/VISTARIL) 50 MG tablet Take 50 mg by mouth at bedtime. Reported on 02/04/2015    . lisinopril (PRINIVIL,ZESTRIL) 40 MG tablet Take 1 tablet (40 mg total) by mouth daily. 90 tablet 3  . losartan (COZAAR) 100 MG tablet TAKE ONE TABLET BY MOUTH DAILY 30 tablet 6  . milk thistle 175 MG tablet Take 350 mg by mouth daily.     . Multiple Vitamin (MULTIVITAMIN) tablet Take 1 tablet by mouth daily. Centrum Plus -Take 1 daily    . Nebivolol HCl (BYSTOLIC) 20 MG TABS Take 2 tablets (40 mg total) by mouth 2 (two) times daily. 180 tablet 3  . tamsulosin (FLOMAX) 0.4 MG CAPS capsule Take 0.4 mg by mouth daily.     . traMADol (ULTRAM) 50 MG tablet     . triamcinolone cream (KENALOG) 0.1 %     . varenicline (CHANTIX CONTINUING MONTH PAK) 1 MG tablet Take 1 tablet (1 mg total) by mouth 2 (two) times daily. 60 tablet 6  . VOLTAREN 1 % GEL Apply 2 g topically as needed (knees).      No current facility-administered medications for this visit.      Allergies:   Crestor [rosuvastatin]; Hydrocodone bitartrate er; and Lipitor [atorvastatin]   Social History:  The patient  reports that he has been smoking Cigarettes.  He has a 6.00 pack-year smoking history. He has never used smokeless tobacco. He reports that he drinks about 7.2 oz of alcohol per week . He reports that he does not use drugs.   Family History:   family history includes Diabetes in his father; Hyperlipidemia in his father; Hypertension in his father and mother; Kidney disease in his father; Lung cancer in his father and mother.    Review of  Systems: Review of Systems  Constitutional: Negative.   Respiratory: Negative.   Cardiovascular: Negative.   Gastrointestinal: Negative.   Musculoskeletal: Positive for back pain.  Neurological: Negative.   Psychiatric/Behavioral: Negative.   All other systems reviewed and are negative.    PHYSICAL EXAM: VS:  BP (!) 158/78 (BP Location: Left Arm, Patient Position: Sitting, Cuff Size: Normal)   Pulse (!) 55   Ht 6' (1.829 m)   Wt 254 lb 12 oz (115.6 kg)   BMI 34.55 kg/m  , BMI Body mass index is 34.55 kg/m. GEN: Well nourished, well developed, in no acute distress, obese  HEENT: normal  Neck: no JVD, carotid bruits, or masses Cardiac: RRR; no murmurs, rubs, or gallops,no edema  Respiratory:  clear to auscultation bilaterally, normal work of breathing GI: soft, nontender, nondistended, + BS MS: no deformity or  atrophy  Skin: warm and dry, no rash Neuro:  Strength and sensation are intact Psych: euthymic mood, full affect    Recent Labs: 02/05/2015: TSH 1.770 10/28/2015: BUN 10; Creatinine, Ser 0.75; Platelets 138; Potassium 4.4; Sodium 137 11/20/2015: ALT 29    Lipid Panel Lab Results  Component Value Date   CHOL 161 11/20/2015   HDL 46 11/20/2015   LDLCALC 86 11/20/2015   TRIG 143 11/20/2015      Wt Readings from Last 3 Encounters:  11/27/15 254 lb 12 oz (115.6 kg)  11/22/15 250 lb (113.4 kg)  11/02/15 249 lb (112.9 kg)       ASSESSMENT AND PLAN:   Coronary artery disease involving native coronary artery of native heart without angina pectoris - Plan: EKG 12-Lead Currently with no symptoms of angina. No further workup at this time.  Calcium score of 2800 Continue aggressive management Stress test last year with no ischemia  Essential hypertension Blood pressure elevated on today's visit, Reports it is low in the afternoon Recommended he try to distribute his medications some morning and some evening to avoid drop later in the day Suggested he call  us if blood pressure continues to run high  Typical atrial flutter ([place]) Recent successful cardioversion Maintaining normal sinus rhythm Will continue anticoagulation.  Alcohol induced fatty liver Stressed importance of alcohol cessation given elevated total bilirubin, fatty liver  Tobacco abuse Down to several cigarettes per day. Recommended cessation  Chantix is working for him  Hyperlipidemia Long discussion with him, goal LDL less than 70, preferably 60 given severe coronary disease. We will increase his Praluent up to 150 mg every 2 weeks   Total encounter time more than 25 minutes  Greater than 50% was spent in counseling and coordination of care with the patient   Disposition:   F/U  6 month   Orders Placed This Encounter  Procedures  . EKG 12-Lead     Signed, Esmond Plants, M.D., Ph.D. 11/27/2015  [place], [place]

## 2015-11-30 ENCOUNTER — Ambulatory Visit (INDEPENDENT_AMBULATORY_CARE_PROVIDER_SITE_OTHER): Payer: 59 | Admitting: Orthopaedic Surgery

## 2015-11-30 ENCOUNTER — Encounter (INDEPENDENT_AMBULATORY_CARE_PROVIDER_SITE_OTHER): Payer: Self-pay | Admitting: Orthopaedic Surgery

## 2015-11-30 VITALS — BP 154/53 | HR 68 | Resp 16 | Ht 73.0 in | Wt 250.0 lb

## 2015-11-30 DIAGNOSIS — G8929 Other chronic pain: Secondary | ICD-10-CM | POA: Diagnosis not present

## 2015-11-30 DIAGNOSIS — M545 Low back pain: Secondary | ICD-10-CM | POA: Diagnosis not present

## 2015-11-30 NOTE — Progress Notes (Signed)
Office Visit Note   Patient: Peter [name]           Date of Birth: 08-07-1955           MRN: [id number] Visit Date: 11/30/2015              Requested by: Coral Spikes, DO [address] [place], [place] [address] PCP: Coral Spikes, DO   Assessment & Plan: Visit Diagnoses: No diagnosis found.  Plan: Peter [name] will return in 1 month if not feeling any better. He has had an exacerbation of chronic low back pain. He feels the discomfort to the left of the of the midline without referred discomfort to either lower extremity. He has not had any bowel or bladder dysfunction. He was seen in the emergency room recently and placed on Flexeril. He notes that it made a difference. I have instructed him on exercises. He preferred not to go to physical therapy. Presently is on eliquis and cannot take NSAIDs. He will continue with the Flexeril and perform the exercises and if not much better we can repeat the MRI scan  Follow-Up Instructions: No Follow-up on file.   Orders:  No orders of the defined types were placed in this encounter.  No orders of the defined types were placed in this encounter.  Area and this demonstrated areas of arthritis from L2-3 through L5-S1. He had some mild spinal stenosis at several levels does not appear to be symptomatic.   Procedures: Peter [name] did have an MRI scan of his lumbar spine in October 2016 No procedures performed   Clinical Data: No additional findings.   Subjective: Chief Complaint  Patient presents with  . Left Shoulder - Pain  . Lower Back - Pain    Low back pain x 1 year, worsening, pain off and one, severe when hurts - pain level 9, more pain on left side, emergency room -  [facility] 11/22/2015, difficulty sleeping, difficulty walking - cyclobenzaprine prn - helps some  Left shoulder pain x 1 month, no injury, burning, stinging, worse at night after activity, heat pad helps  On blood thinner, cannot take Advil or  Ibuprofin    Review of Systems   Objective: Vital Signs: BP (!) 154/53 (BP Location: Left Arm, Patient Position: Sitting, Cuff Size: Large)   Pulse 68   Resp 16   Ht 6\' 1"  (1.854 m)   Wt 250 lb (113.4 kg)   BMI 32.98 kg/m   Physical Exam  Ortho Exam straight leg raise is negative bilaterally . Reflexes were symmetrical. Neurovascular exam was intact. There was no percussible tenderness of the lumbar spine. Pain is range of motion of both hips.  Specialty Comments:  No specialty comments available.  Imaging: No results found.   PMFS History: Patient Active Problem List   Diagnosis Date Noted  . Typical atrial flutter ([place]) 09/21/2015  . CAD (coronary artery disease) 05/06/2015  . Aortic atherosclerosis ([place]) 03/20/2015  . History of alcohol abuse 02/04/2015  . Alcohol induced fatty liver 02/04/2015  . Obesity (BMI 30.0-34.9) 02/04/2015  . Back pain 02/04/2015  . History of prostate cancer 02/04/2015  . Preventative health care 02/04/2015  . Leg pain 02/04/2015  . Chronic venous insufficiency 10/12/2012  . Lesion of ulnar nerve 09/03/2012  . Tobacco abuse 04/05/2011  . Hyperlipidemia 01/21/2010  . Essential hypertension 12/16/2009  . Premature beats 12/16/2009   Past Medical History:  Diagnosis Date  . Abnormality of gait 09/03/2012  .  Alcohol abuse   . Anxiety   . Arthritis   . Arthritis   . Esophageal stricture   . GERD (gastroesophageal reflux disease)   . Gout   . Hyperlipidemia   . Hypertension   . Internal hemorrhoids   . Lesion of ulnar nerve 09/03/2012   Right ulnar neuropathy  . Murmur, cardiac   . Palpitations   . Prostate cancer ([place]) 2016   treated with radioactive seed implant  . Seizures ([place])    pt states he had seizure 12/14  . Tubular adenoma of colon 03/2008    Family History  Problem Relation Age of Onset  . Diabetes Father   . Kidney disease Father   . Lung cancer Father     smoker  . Hyperlipidemia Father   . Hypertension  Father   . Lung cancer Mother     smoke  . Hypertension Mother   . Colon cancer Neg Hx   . Pancreatic cancer Neg Hx   . Stomach cancer Neg Hx     Past Surgical History:  Procedure Laterality Date  . CATARACT EXTRACTION W/PHACO Right 07/03/2014   Procedure: CATARACT EXTRACTION PHACO AND INTRAOCULAR LENS PLACEMENT (IOC);  Surgeon: Lyla Glassing, MD;  Location: [facility] ORS;  Service: Ophthalmology;  Laterality: Right;  lot # [id number] H [place]: 00:32.3 AP% 9.1 CDE: 2.92  . CIRCUMCISION    . COLONOSCOPY    . ELBOW SURGERY    . ELECTROPHYSIOLOGIC STUDY N/A 11/02/2015   Procedure: CARDIOVERSION;  Surgeon: Minna Merritts, MD;  Location: [facility] ORS;  Service: Cardiovascular;  Laterality: N/A;  . EYE SURGERY    . HAND SURGERY Left   . SHOULDER SURGERY     Social History   Occupational History  . Maintenance supervisor  [employer]  . 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 [employer]   Social History Main Topics  . Smoking status: Current Every Day Smoker    Years: 24.00    Types: Cigarettes  . Smokeless tobacco: Never Used     Comment: down to 2 ciggs a day.  . Alcohol use 8.4 oz/week    14 Cans of beer per week     Comment: 2 per day, history of alcohol abuse  . Drug use: No     Comment: 1970's occ.   Marland Kitchen Sexual activity: Not on file

## 2015-12-07 ENCOUNTER — Telehealth: Payer: Self-pay | Admitting: Pharmacist

## 2015-12-07 MED ORDER — ALIROCUMAB 150 MG/ML ~~LOC~~ SOPN: 1.0000 "pen " | PEN_INJECTOR | SUBCUTANEOUS | 11 refills | Status: DC

## 2015-12-07 NOTE — Telephone Encounter (Signed)
Lipid panel on Praluent shows LDL improved from 174 to 86 but still above goal 70mg /dL given ASCVD. Will increase Praluent to 150mg /mL dose, rx sent to specialty pharmacy.

## 2015-12-15 ENCOUNTER — Telehealth: Payer: Self-pay | Admitting: Cardiovascular Disease

## 2015-12-15 NOTE — Telephone Encounter (Signed)
Pt calling stating he got paperwork from insurance company stating his Bystolic was not covered.  Would need more info from our office.  Please call [phone]  Please advise.

## 2015-12-16 ENCOUNTER — Other Ambulatory Visit: Payer: Self-pay | Admitting: Cardiovascular Disease

## 2015-12-16 MED ORDER — NEBIVOLOL HCL 20 MG PO TABS: 20.0000 mg | ORAL_TABLET | Freq: Every day | ORAL | 3 refills | Status: DC

## 2015-12-16 NOTE — Telephone Encounter (Signed)
Please review and advise. Thanks.  

## 2015-12-16 NOTE — Telephone Encounter (Signed)
Spoke with Peter [name] with [facility] regarding the Bystolic dosing.  Please advise if the Bystolic is take 20 mg two tablets twice a day.  The pharmacy is questioning the QTY of tablets since the patient needs a 90 day supply.

## 2015-12-16 NOTE — Telephone Encounter (Signed)
Spoke with patient and he has verified the dose of Bystolic is 20 mg one tablet daily.  The patient does not take the Bystolic 20 (2 tablets) twice a day.

## 2015-12-18 NOTE — Telephone Encounter (Signed)
Spoke with Trilby Leaver with [facility] the patient is only taking Bystolic 20 mg one tablet daily, gave 90 day supply with 3 refills.

## 2015-12-29 ENCOUNTER — Telehealth: Payer: Self-pay | Admitting: Gastroenterology

## 2015-12-29 ENCOUNTER — Telehealth: Payer: Self-pay | Admitting: Cardiovascular Disease

## 2015-12-29 ENCOUNTER — Telehealth: Payer: Self-pay | Admitting: Family Medicine

## 2015-12-29 NOTE — Telephone Encounter (Signed)
Patient reports 2 days of pedal edema. Patient advised that according to our records his test for Hep C was negative.  He read on the Internet that pedal edema can be caused by hep C.  He is advised that he should see his primary care for pedal edema and if they feel it is related to GI we will be happy to see him .

## 2015-12-29 NOTE — Telephone Encounter (Addendum)
Attempted to return call.  "wireless customer is not available".  I will call back later

## 2015-12-29 NOTE — Telephone Encounter (Signed)
Patient Name: Peter [name]  DOB: January 30, 1955    Initial Comment Caller states is complaining that his feet are tender to the touch and this week feet started to swell.    Nurse Assessment  Nurse: Thad Ranger RN, Denise Date/Time (Eastern Time): 12/29/2015 4:20:51 PM  Confirm and document reason for call. If symptomatic, describe symptoms. ---Caller states is complaining that his feet are tender to the touch and this week feet started to swell. Denies leg pain.  Does the patient have any new or worsening symptoms? ---Yes  Will a triage be completed? ---Yes  Related visit to physician within the last 2 weeks? ---No  Does the PT have any chronic conditions? (i.e. diabetes, asthma, etc.) ---Yes  List chronic conditions. ---A-Fib, Cardio Conversion 3 wks ago w/conversion complete, Anticoag tx.  Is this a behavioral health or substance abuse call? ---No     Guidelines    Guideline Title Affirmed Question Affirmed Notes  Ankle Swelling [1] Very swollen joint AND [2] no fever    Final Disposition User   See Physician within [place], RN, Langley Gauss    Comments  Pt states he called his cardiologist today and was told to wear his TED hose and has been doing this with some improvement in the ankle/foot edema.  Appt made 12/30/15 at 1515 with Dr Thersa Salt. Pt aware/agreeable.  Advised do not take NSAIDs with use anitcoagulants. Advised if med needed for pain, take Tylenol only. Pt states his cardiologist told him not to take Tylenol, but may take Tramadol if needed. Advised to follow MD orders. Verb understanding.   Referrals  REFERRED TO PCP OFFICE   Disagree/Comply: Comply

## 2015-12-29 NOTE — Telephone Encounter (Signed)
Pt is calling states his feet are swelling  Pt c/o swelling: STAT is pt has developed SOB within 24 hours  1. How long have you been experiencing swelling? About a week  2. Where is the swelling located? Bilateral feet  3.  Are you currently taking a "fluid pill"? No  4.  Are you currently SOB? no  5.  Have you traveled recently? No   Other than the swelling, he is doing fine with BP and HR

## 2015-12-29 NOTE — Telephone Encounter (Signed)
Attempted to call again - same message.

## 2015-12-29 NOTE — Telephone Encounter (Signed)
Pt called and stated that both feet are swelling and that they a slightly painful. Sent call to [facility] Triage.

## 2015-12-29 NOTE — Telephone Encounter (Signed)
Appointment scheduled for tomorrow with PCP, spoke with Cardiology today.

## 2015-12-29 NOTE — Telephone Encounter (Signed)
Patient states that he has swelling in both feet for about 3 to 4 days. He states that it goes down at night but they do swell up during the day. He denies any chest pain or shortness of breath. Reports that his blood pressures have been normal and current 125/80. He reports that he has had prednisone injections in joints. Instructed him to wear compression socks and to elevate his feet on 2 to 3 pillows. Offered appointment on 01/19/16 and he states that he didn't want to come in and pay co-pay at this time. So he wants to just wait until his regular follow up at this time. Instructed him to call back if he continues to have symptoms and would like to come in and see someone. He verbalized understanding of our conversation and had no further questions at this time.

## 2015-12-29 NOTE — Telephone Encounter (Signed)
fyi

## 2015-12-30 ENCOUNTER — Ambulatory Visit (INDEPENDENT_AMBULATORY_CARE_PROVIDER_SITE_OTHER): Payer: 59 | Admitting: Family Medicine

## 2015-12-30 ENCOUNTER — Encounter: Payer: Self-pay | Admitting: Family Medicine

## 2015-12-30 DIAGNOSIS — R600 Localized edema: Secondary | ICD-10-CM | POA: Diagnosis not present

## 2015-12-30 NOTE — Progress Notes (Signed)
Pre visit review using our clinic review tool, if applicable. No additional management support is needed unless otherwise documented below in the visit note. 

## 2015-12-30 NOTE — Telephone Encounter (Signed)
FYI

## 2015-12-30 NOTE — Assessment & Plan Note (Signed)
New problem (to me). No evidence of congestive heart failure. This is likely secondary to venous insufficiency in combination with calcium channel blocker. Advised compression and elevation. We'll discuss discontinuation of calcium channel blocker with his cardiologist.

## 2015-12-30 NOTE — Patient Instructions (Addendum)
Elevation, compression.  I will discuss discontinuation of your Diltiazem (which could be contributing).  Follow up in 3-6 months.  Take care  Dr. Lacinda Axon

## 2015-12-30 NOTE — Progress Notes (Signed)
Subjective:  Patient ID: Peter [name], male    DOB: [birth year]  Age: 60 y.o. MRN: [id number]  CC: LE edema  HPI:  60 year old male with hx of atrial flutter s/p cardioversion, HTN, HLD, alcohol abuse, CAD, tobacco abuse, chronic venous insufficiency presents with the above complaint.  LE edema  Has a reported history of LE edema (also noted in EMR).  I have never seen him for this.   He reports bilateral ankle and foot edema over the past 1-2 weeks.  No associated shortness of breath. No chest pain.  Improved in the morning and worse throughout the day.  He states that he intermittently uses compression stockings.  He endorses low sodium diet but does continue to drink alcohol. 3-4 beers a day.  Has a history of diastolic dysfunction but no history of diastolic heart failure or systolic heart failure.  No recent fall, trauma, injury.  He is currently on diltiazem which is known to cause lower extremity edema.  No other complaints or concerns at this time.  Social Hx   Social History   Social History  . Marital status: Married    Spouse name: N/A  . Number of children: 0  . Years of education: hs   Occupational History  . 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 [employer]   Social History Main Topics  . Smoking status: Current Every Day Smoker    Years: 24.00    Types: Cigarettes  . Smokeless tobacco: Never Used     Comment: down to 2 ciggs a day.  . Alcohol use 8.4 oz/week    14 Cans of beer per week     Comment: 2 per day, history of alcohol abuse  . Drug use: No     Comment: 1970's occ.   Marland Kitchen Sexual activity: Not Asked   Other Topics Concern  . None   Social History Narrative  . None   Review of Systems  Respiratory: Negative.   Cardiovascular: Positive for leg swelling. Negative for chest pain.   Objective:  BP 119/72   Pulse 62   Temp 97.7 F (36.5 C) (Oral)   Resp 14   Wt 265 lb 4 oz (120.3 kg)   SpO2 96%   BMI 35.00  kg/m   BP/Weight 12/30/2015 11/30/2015 [id number]  Systolic BP 123456 123456 0000000  Diastolic BP 72 53 78  Wt. (Lbs) 265.25 250 254.75  BMI 35 32.98 34.55   Physical Exam  Constitutional: He is oriented to person, place, and time. He appears well-developed. No distress.  Cardiovascular: Normal rate and regular rhythm.   2/6 systolic murmur. 2+ foot and ankle edema bilaterally.   Pulmonary/Chest: Effort normal and breath sounds normal.  Neurological: He is alert and oriented to person, place, and time.  Psychiatric: He has a normal mood and affect.  Vitals reviewed.  Lab Results  Component Value Date   WBC 11.2 (H) 10/28/2015   HGB 14.2 12/22/2012   HCT 37.9 10/28/2015   PLT 138 (L) 10/28/2015   GLUCOSE 127 (H) 10/28/2015   CHOL 161 11/20/2015   TRIG 143 11/20/2015   HDL 46 11/20/2015   LDLCALC 86 11/20/2015   ALT 29 11/20/2015   AST 60 (H) 11/20/2015   NA 137 10/28/2015   K 4.4 10/28/2015   CL 98 10/28/2015   CREATININE 0.75 (L) 10/28/2015   BUN 10 10/28/2015   CO2 21 10/28/2015   TSH 1.770 02/05/2015   INR 1.3 (  H) 10/28/2015   HGBA1C 5.5 02/05/2015   Assessment & Plan:   Problem List Items Addressed This Visit    Lower extremity edema    New problem (to me). No evidence of congestive heart failure. This is likely secondary to venous insufficiency in combination with calcium channel blocker. Advised compression and elevation. We'll discuss discontinuation of calcium channel blocker with his cardiologist.        Follow-up: 3-6 months.  [place]

## 2016-01-01 ENCOUNTER — Telehealth: Payer: Self-pay | Admitting: Cardiovascular Disease

## 2016-01-01 ENCOUNTER — Telehealth: Payer: Self-pay | Admitting: Family Medicine

## 2016-01-01 DIAGNOSIS — R600 Localized edema: Secondary | ICD-10-CM

## 2016-01-01 NOTE — Telephone Encounter (Signed)
Pt called 12/19 concerned for LE edema x 3-4 days, worsening each day. He was advised to elevate legs and wear compression stockings. He calls again today stating at his PCP appointment, Dr. Lacinda Axon felt sx were r/t cardizem. He has taken 120mg  once daily as well as eliquis 5mg  BID since Sept.  Reports BP 120s/70-80s, HR 55-65. He would like to stop cardizem and eliquis. Weight 265lbs today at PCP office.  Advised pt he needs to continue all medications and never stop any w/o MD consent.  Will review w/NP.   Ignacia Bayley, NP, reviewed and does not feel significant weight gain is r/t medication. He has advised for pt to continue to elevate legs, wear compression stockings and f/u with an office visit next week as he would need to be seen before consideration of a diuretic.  Per scheduling, there are no openings. Will discuss appt date/time with Dr. Rockey Situ.  Reviewed recommendations w/pt who verbalized understanding and repeated back to me. He understands to continue cardizem and eliquis. Discussed low sodium diet. Pt states he doesn't consume sodas but he has 2-3 beers per night. Advised pt to refrain from alcohol.  He verbalized understanding with no further questions at this time. Routed to Dr. Rockey Situ.

## 2016-01-01 NOTE — Telephone Encounter (Signed)
Were you able to speak to [place]?

## 2016-01-01 NOTE — Telephone Encounter (Signed)
I have not gotten a hold of him yet.

## 2016-01-01 NOTE — Telephone Encounter (Signed)
Pt called and was wondering if Dr. Theodosia Paling was able to speak with Dr. Rockey Situ in regards to carti xt 120 mg cap 2x daily. Please advise, thank you!  Call pt @ [phone].

## 2016-01-01 NOTE — Telephone Encounter (Signed)
Patient is still having problem with swelling and pcp seems to think its the diltiazem (CARDIZEM CD) 120 MG 24 hr capsule. Patient wants to know what to do and the swelling is very uncomfortable. (0-10 is a 9).   Patient is on apixaban (ELIQUIS) 5 MG TABS tablet and wants to know if he is suppoded to continue this medicine?

## 2016-01-05 NOTE — Telephone Encounter (Signed)
Dr. Rockey Situ is out of the office until Thursday.

## 2016-01-05 NOTE — Telephone Encounter (Signed)
1. LE swelling may be 2/2 Cardizem, though cannot rule out volume overload given reported increase in weights. Would need echo prior to starting diuretic therapy to help assess this. Agree with multiple recommendations of leg elevation and compression hose, please make certain he is doing this. Recent successful DCCV in 10/2015 for atrial flutter. He can pursue a trial of stopping Cardizem to assess how this affects his LE swelling. Conflicting data on how he is taking Bystolic. Medications state he is taking 20 mg daily and his last office note with Dr. Rockey Situ say he is taking 40 mg bid with a possible reorder of 20 mg daily. If he is taking Bystolic 20 mg daily he can increase this to 40 mg daily in an effort to hold sinus rhythm with discontinuation of Cardizem. The above discontinuation of Cardizem may lead to redevelopment of atrial flutter. Continue Eliquis.

## 2016-01-05 NOTE — Telephone Encounter (Signed)
Attempted to contact pt.  No answer, vm not set up. Will attempt again later.

## 2016-01-05 NOTE — Telephone Encounter (Signed)
It appears that the patient has spoke with cardiology regarding this and they have a plan in place. Will forward to Dr Lacinda Axon to review on his return to the office.

## 2016-01-05 NOTE — Telephone Encounter (Signed)
Spoke w/ pt.  Advised him of Ryan's recommendation.  Pt reports that he has been taking Bystolic 20 mg daily.  He is agreeable to holding cardizem and increasing Bystolic to see if sx improve. Had lengthy discussion w/ pt about his diagnoses and potential reasons why he is swelling. 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 Pt is agreeable to scheduling ECHO, but as I was about to transfer the call, pt's doorbell rang. Advised him to call back to schedule this at his earliest convenience.

## 2016-01-05 NOTE — Telephone Encounter (Signed)
Pt called and was wondering if Dr. Lacinda Axon has been able to speak with Dr. Rockey Situ. Pt is going to see if he can try and get in with Dr. Rockey Situ.

## 2016-01-05 NOTE — Telephone Encounter (Signed)
Patient has contacted Dr. Rockey Situ office and is awaiting return call concerning bilateral edema to extremities, which patient is wondering if due to medication changes in carvedilol and Praluent. FYI

## 2016-01-05 NOTE — Telephone Encounter (Signed)
Pt calling this morning stating he is still having the swelling  He states he needs some "Relief" soon for it has been going on a week.  He thinks it may be one of his new medications Praluent and or Cardizem  He states those are the two the only new ones he's started on  Would like to know if he can get some advise on possiably holding on off on a medication a bit to see if that is was is causing the swelling. Please advise

## 2016-01-07 ENCOUNTER — Other Ambulatory Visit: Payer: Self-pay | Admitting: Cardiovascular Disease

## 2016-01-12 ENCOUNTER — Telehealth: Payer: Self-pay | Admitting: Cardiovascular Disease

## 2016-01-12 NOTE — Telephone Encounter (Signed)
Pt calling just to let us know  He has stopped taking the Cardizem and Praluent  He will not take it until after the Echo on 01/26/16

## 2016-01-18 ENCOUNTER — Ambulatory Visit (INDEPENDENT_AMBULATORY_CARE_PROVIDER_SITE_OTHER): Payer: 59 | Admitting: Family Medicine

## 2016-01-18 ENCOUNTER — Encounter: Payer: Self-pay | Admitting: Family Medicine

## 2016-01-18 VITALS — BP 193/68 | HR 54 | Temp 98.2°F | Resp 14 | Wt 262.2 lb

## 2016-01-18 DIAGNOSIS — I10 Essential (primary) hypertension: Secondary | ICD-10-CM | POA: Diagnosis not present

## 2016-01-18 DIAGNOSIS — Z4802 Encounter for removal of sutures: Secondary | ICD-10-CM

## 2016-01-18 NOTE — Progress Notes (Signed)
Pre visit review using our clinic review tool, if applicable. No additional management support is needed unless otherwise documented below in the visit note. 

## 2016-01-18 NOTE — Patient Instructions (Signed)
Restart the injectable.  If your BP's are consistently elevated at home. Please let me know.  Take care  Dr. Lacinda Axon

## 2016-01-19 DIAGNOSIS — Z4802 Encounter for removal of sutures: Secondary | ICD-10-CM | POA: Insufficient documentation

## 2016-01-19 NOTE — Assessment & Plan Note (Signed)
Established problem, worsening. Patient states his blood pressures are well-controlled at home. Continue lisinopril and diastolic. Patient to take his blood pressure regularly at home. If it is consistently elevated greater than 130/80, he is to contact me for further treatment.

## 2016-01-19 NOTE — Telephone Encounter (Signed)
Spoke with pt regarding Praluent. He states he had stopped the injections over concern that it was causing foot swelling. His MD ruled out Praluent as a cause and advised him he was safe to restart injections. Pt still has 6 of his 75mg  pens as well as a few 150mg  pens. Advised him that he can give 2 Praluent75mg  injections on the same day. He has been injecting into his thigh, advised him to inject 1 pen into each thigh to equal the 150mg  dose. Pt verbalized understanding.

## 2016-01-19 NOTE — Telephone Encounter (Signed)
Pt calling stating he saw PCP yesterday and was advised to go back on Praluent  He is going to start it back up, he would like to know  He had some 75's left over and would like to know if he can take 2 of those today or should he do one today and one tomorrow. Please advise

## 2016-01-19 NOTE — Progress Notes (Signed)
Subjective:  Patient ID: Peter [name], male    DOB: 06-27-1955  Age: 61 y.o. MRN: [id number]  CC: Suture removal, HTN  HPI:  61 year old male with hx of atrial flutter, HTN, HLD, CAD presents for the above.  Suture removal  Patient states that he suffered a cut of his right index finger on the dorsal side just distal to the MCP joint on 12/28.  He went to local urgent care and his wound was closed with suture.  He presents today for suture removal.  No redness.  No drainage from the wound.  He's been applying topical antibiotic ointment.  No associated fever or chills. No other associated symptoms or issues at this time.  HTN  Diltiazem was recently stopped after patient experienced increasing and severe lower extremity edema.  His blood pressure is markedly elevated today.  He endorses compliance with lisinopril and bysystolic. He states his BP's are well controlled at home ([id number] systolic).  He has no chest pain, shortness of breath or vision changes. No other signs or symptoms of end organ damage.  Social Hx   Social History   Social History  . Marital status: Married    Spouse name: N/A  . Number of children: 0  . Years of education: hs   Occupational History  . Maintenance supervisor  [employer]  . 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 [employer]   Social History Main Topics  . Smoking status: Current Every Day Smoker    Years: 24.00    Types: Cigarettes  . Smokeless tobacco: Never Used     Comment: down to 2 ciggs a day.  . Alcohol use 8.4 oz/week    14 Cans of beer per week     Comment: 2 per day, history of alcohol abuse  . Drug use: No     Comment: 1970's occ.   Marland Kitchen Sexual activity: Not Asked   Other Topics Concern  . None   Social History Narrative  . None   Review of Systems  Constitutional: Negative.   Cardiovascular: Positive for leg swelling.  Skin: Positive for wound.   Objective:  BP (!) 193/68   Pulse (!) 54   Temp 98.2 F (36.8 C)  (Oral)   Resp 14   Wt 262 lb 3.2 oz (118.9 kg)   SpO2 96%   BMI 34.59 kg/m   BP/Weight 01/18/2016 12/30/2015 [id number]  Systolic BP 0000000 123456 [id number]  Diastolic BP 68 72 53  Wt. (Lbs) 262.2 265.25 250  BMI 34.59 35 32.98   Physical Exam  Constitutional: He is oriented to person, place, and time. He appears well-developed. No distress.  Cardiovascular: Normal rate and regular rhythm.   Pulmonary/Chest: Effort normal and breath sounds normal.  Neurological: He is alert and oriented to person, place, and time.  Skin:  Linear laceration of the index finger (right) just distal to the MCP joint (dorsum). No drainage or erythema.   Psychiatric: He has a normal mood and affect.  Vitals reviewed.   Lab Results  Component Value Date   WBC 11.2 (H) 10/28/2015   HGB 14.2 12/22/2012   HCT 37.9 10/28/2015   PLT 138 (L) 10/28/2015   GLUCOSE 127 (H) 10/28/2015   CHOL 161 11/20/2015   TRIG 143 11/20/2015   HDL 46 11/20/2015   LDLCALC 86 11/20/2015   ALT 29 11/20/2015   AST 60 (H) 11/20/2015   NA 137 10/28/2015   K 4.4 10/28/2015   CL 98 10/28/2015   CREATININE  0.75 (L) 10/28/2015   BUN 10 10/28/2015   CO2 21 10/28/2015   TSH 1.770 02/05/2015   INR 1.3 (H) 10/28/2015   HGBA1C 5.5 02/05/2015    Assessment & Plan:   Problem List Items Addressed This Visit    Essential hypertension    Established problem, worsening. Patient states his blood pressures are well-controlled at home. Continue lisinopril and diastolic. Patient to take his blood pressure regularly at home. If it is consistently elevated greater than 130/80, he is to contact me for further treatment.      Encounter for removal of sutures    New problem. Sutures removed without difficulty today. Mild dehiscence noted. Steri-Strips were used to keep wound approximated while it continues to heal.        Follow-up: As scheduled  [place]

## 2016-01-19 NOTE — Assessment & Plan Note (Signed)
New problem. Sutures removed without difficulty today. Mild dehiscence noted. Steri-Strips were used to keep wound approximated while it continues to heal.

## 2016-01-21 ENCOUNTER — Encounter: Payer: Self-pay | Admitting: Gastroenterology

## 2016-01-26 ENCOUNTER — Other Ambulatory Visit: Payer: 59

## 2016-02-12 DIAGNOSIS — H6980 Other specified disorders of Eustachian tube, unspecified ear: Secondary | ICD-10-CM | POA: Diagnosis not present

## 2016-02-12 DIAGNOSIS — H6123 Impacted cerumen, bilateral: Secondary | ICD-10-CM | POA: Diagnosis not present

## 2016-02-16 ENCOUNTER — Telehealth: Payer: Self-pay | Admitting: Cardiovascular Disease

## 2016-02-16 ENCOUNTER — Ambulatory Visit (INDEPENDENT_AMBULATORY_CARE_PROVIDER_SITE_OTHER): Payer: 59

## 2016-02-16 ENCOUNTER — Other Ambulatory Visit: Payer: Self-pay

## 2016-02-16 ENCOUNTER — Ambulatory Visit: Payer: 59 | Admitting: Cardiovascular Disease

## 2016-02-16 DIAGNOSIS — R600 Localized edema: Secondary | ICD-10-CM | POA: Diagnosis not present

## 2016-02-16 NOTE — Telephone Encounter (Signed)
Pt added on to see Dr. Rockey Situ today @ 11:40.

## 2016-02-16 NOTE — Telephone Encounter (Signed)
Patient in office for an echo.    Feels a little flutter and sometimes pain episodes a couple times of day.    Pt c/o of Chest Pain: STAT if CP now or developed within 24 hours  1. Are you having CP right now? No   2. Are you experiencing any other symptoms (ex. SOB, nausea, vomiting, sweating)?  Nauseated - thinks due to chantix  Sometimes SOB   3. How long have you been experiencing CP? Since pcp dc diltiazem due to increase and severe LE edema   4. Is your CP continuous or coming and going? Comes and goes 2 episodes a day   5. Have you taken Nitroglycerin? No   Patient says every now and then he has pain in chest .  Mostly quivering fluttering feeling since pcp stopped Diltiazem.    ?

## 2016-02-23 ENCOUNTER — Encounter: Payer: Self-pay | Admitting: Cardiovascular Disease

## 2016-02-23 ENCOUNTER — Ambulatory Visit (INDEPENDENT_AMBULATORY_CARE_PROVIDER_SITE_OTHER): Payer: 59 | Admitting: Cardiovascular Disease

## 2016-02-23 VITALS — BP 190/99 | HR 57 | Ht 72.0 in | Wt 255.0 lb

## 2016-02-23 DIAGNOSIS — I2510 Atherosclerotic heart disease of native coronary artery without angina pectoris: Secondary | ICD-10-CM

## 2016-02-23 DIAGNOSIS — I4949 Other premature depolarization: Secondary | ICD-10-CM

## 2016-02-23 DIAGNOSIS — I483 Typical atrial flutter: Secondary | ICD-10-CM

## 2016-02-23 DIAGNOSIS — I10 Essential (primary) hypertension: Secondary | ICD-10-CM

## 2016-02-23 DIAGNOSIS — F419 Anxiety disorder, unspecified: Secondary | ICD-10-CM

## 2016-02-23 DIAGNOSIS — E7800 Pure hypercholesterolemia, unspecified: Secondary | ICD-10-CM

## 2016-02-23 MED ORDER — ISOSORBIDE MONONITRATE ER 30 MG PO TB24: 30.0000 mg | ORAL_TABLET | Freq: Every day | ORAL | 11 refills | Status: DC

## 2016-02-23 NOTE — Patient Instructions (Addendum)
Medication Instructions:   Please start imdur/isosorbide one a day for blood pressure  Consider taking bystolic 20 mg twice a day (Am/PM)  Please call with blood pressures  Labwork:  No new labs needed  Testing/Procedures:  No further testing at this time   I recommend watching educational videos on topics of interest to you at:       [email or web address]  Enter code: [email or web address]    Follow-Up: It was a pleasure seeing you in the office today. Please call us if you have new issues that need to be addressed before your next appt.  [phone]  Your physician wants you to follow-up in: 6 months.  You will receive a reminder letter in the mail two months in advance. If you don't receive a letter, please call our office to schedule the follow-up appointment.  If you need a refill on your cardiac medications before your next appointment, please call your pharmacy.

## 2016-02-23 NOTE — Progress Notes (Signed)
Cardiology Office Note  Date:  02/23/2016   ID:  Peter [name], DOB [birth year], MRN [id number]  PCP:  Peter Spikes, DO   Chief Complaint  Patient presents with  . other     F/U Echo and discuss chest pain. Pt  Reviewed meds with pt verbally.    HPI:  Peter [name] is a 61 year old gentleman with long history of smoking, hypertension, daily alcohol drinking, prostate cancer , statin intolerance ,  who presents for follow-up of his coronary artery disease, hypertension, hyperlipidemia, follow-up of his atrial flutter History of underlying liver dysfunction, elevated total bilirubin CT coronary calcium score 2778 in 04/2015 Stress test 07/2014 no ischemia Cardioversion 11/02/2015 For atrial flutter  BP elevated At home, anxious about recent stock market activity bystolic 40 mg in Am Lisinopril in the evening 40 mg Even on this regiment numbers are very high in the morning especially  On chantix, down to 2 cigs Feels good about less smoking  Having rare flutter in his chest Once a week to start   reports that previously he was taking buspar. Felt better on this medication, less anxiety, was on this for years. Unclear why was held in the past  Recent echocardiogram every 02/2016 showing normal LV function  Chronic back pain He went to the ER for back pain 11/22/2015 Started On tramadol PRN  Denies any chest pain concerning for angina EKG on today's visit shows sinus bradycardia, rate 57 bpm,  no significant ST or T-wave changes  Other past medical history reviewed He is tolerating praluent , dramatic improvement in his cholesterol down to 160 from 260  Limited by back pain , was told he could get a "shot" "shaking" for 2 years since prostate seeds, stopped librium Still with heavy ETOH, previous notes indicating 4-5 beers per day Frequent nocturia Has been taking lisinopril 40 mg x 2, presumably because blood pressure was running high, now running out of his medication. Did  not call our office  Previous stress test July 2016 showing no ischemia CT scan of the chest, CT coronary calcium score discussed with him in detail, images pulled up in the office. His scores close to 3000. He denies any anginal symptoms. Images show diffuse heavy calcification/calcified atherosclerosis in the LAD, diagonal branch, ostial and proximal RCA. No significant disease noted in the left circumflex.   he reports being debilitated when he was on Crestor, Lipitor He had diffuse muscle pain in his legs, has tried different doses, does not want to retry a statin given the debilitating side effects. Feels it made it impossible to get around, on top of his arthritis, severe knee pain, it was just too much  CT scan of the abdomen shows scattered diffuse mild aortic atherosclerosis.  Lab work reviewed with him showing hemoglobin A1c 5.5 , total cholesterol more than 230   PMH:   has a past medical history of Abnormality of gait (09/03/2012); Alcohol abuse; Anxiety; Arthritis; Arthritis; Esophageal stricture; GERD (gastroesophageal reflux disease); Gout; Hyperlipidemia; Hypertension; Internal hemorrhoids; Lesion of ulnar nerve (09/03/2012); Murmur, cardiac; Palpitations; Prostate cancer ([place]) (2016); Seizures (Peter [name]); and Tubular adenoma of colon (03/2008).  PSH:    Past Surgical History:  Procedure Laterality Date  . CATARACT EXTRACTION W/PHACO Right 07/03/2014   Procedure: CATARACT EXTRACTION PHACO AND INTRAOCULAR LENS PLACEMENT (IOC);  Surgeon: Peter Glassing, MD;  Location: [facility] ORS;  Service: Ophthalmology;  Laterality: Right;  lot # [id number] H [place]: 00:32.3 AP% 9.1 CDE: 2.92  . CIRCUMCISION    .  COLONOSCOPY    . ELBOW SURGERY    . ELECTROPHYSIOLOGIC STUDY N/A 11/02/2015   Procedure: CARDIOVERSION;  Surgeon: Minna Merritts, MD;  Location: [facility] ORS;  Service: Cardiovascular;  Laterality: N/A;  . EYE SURGERY    . HAND SURGERY Left   . SHOULDER SURGERY      Current Outpatient  Prescriptions  Medication Sig Dispense Refill  . Alirocumab (PRALUENT) 150 MG/ML SOPN Inject 1 pen into the skin every 14 (fourteen) days. 2 pen 11  . apixaban (ELIQUIS) 5 MG TABS tablet Take 1 tablet (5 mg total) by mouth 2 (two) times daily. 180 tablet 3  . colchicine 0.6 MG tablet Take 0.6 mg by mouth as needed (gout). For gout flare ups    . cyclobenzaprine (FLEXERIL) 10 MG tablet Take 1 tablet (10 mg total) by mouth 3 (three) times daily as needed for muscle spasms. 21 tablet 0  . esomeprazole (NEXIUM) 20 MG capsule Take 20 mg by mouth daily at 12 noon.    . hydrOXYzine (ATARAX/VISTARIL) 50 MG tablet Take 50 mg by mouth at bedtime. Reported on 02/04/2015    . lisinopril (PRINIVIL,ZESTRIL) 40 MG tablet Take 1 tablet (40 mg total) by mouth daily. 90 tablet 3  . milk thistle 175 MG tablet Take 350 mg by mouth daily.     . Multiple Vitamin (MULTIVITAMIN) tablet Take 1 tablet by mouth daily. Centrum Plus -Take 1 daily    . Nebivolol HCl (BYSTOLIC) 20 MG TABS Take 1 tablet (20 mg total) by mouth daily. 90 tablet 3  . tamsulosin (FLOMAX) 0.4 MG CAPS capsule Take 0.4 mg by mouth daily.     . traMADol (ULTRAM) 50 MG tablet     . triamcinolone cream (KENALOG) 0.1 %     . varenicline (CHANTIX CONTINUING MONTH PAK) 1 MG tablet Take 1 tablet (1 mg total) by mouth 2 (two) times daily. 60 tablet 6  . VOLTAREN 1 % GEL Apply 2 g topically as needed (knees).     . isosorbide mononitrate (IMDUR) 30 MG 24 hr tablet Take 1 tablet (30 mg total) by mouth daily. 30 tablet 11   No current facility-administered medications for this visit.      Allergies:   Crestor [rosuvastatin]; Hydrocodone bitartrate er; and Lipitor [atorvastatin]   Social History:  The patient  reports that he has been smoking Cigarettes.  He has smoked for the past 24.00 years. He has never used smokeless tobacco. He reports that he drinks about 8.4 oz of alcohol per week . He reports that he does not use drugs.   Family History:   family  history includes Diabetes in his father; Hyperlipidemia in his father; Hypertension in his father and mother; Kidney disease in his father; Lung cancer in his father and mother.    Review of Systems: Review of Systems  Constitutional: Negative.   Respiratory: Negative.   Cardiovascular: Negative.   Gastrointestinal: Negative.   Musculoskeletal: Negative.   Neurological: Negative.   Psychiatric/Behavioral: The patient is nervous/anxious.   All other systems reviewed and are negative.    PHYSICAL EXAM: VS:  BP (!) 190/99 (BP Location: Left Arm, Patient Position: Sitting, Cuff Size: Normal)   Pulse (!) 57   Ht 6' (1.829 m)   Wt 255 lb (115.7 kg)   BMI 34.58 kg/m  , BMI Body mass index is 34.58 kg/m. GEN: Well nourished, well developed, in no acute distress , obese HEENT: normal  Neck: no JVD, carotid bruits, or masses Cardiac:  RRR; no murmurs, rubs, or gallops,no edema  Respiratory:  clear to auscultation bilaterally, normal work of breathing GI: soft, nontender, nondistended, + BS MS: no deformity or atrophy  Skin: warm and dry, no rash Neuro:  Strength and sensation are intact Psych: euthymic mood, full affect    Recent Labs: 10/28/2015: BUN 10; Creatinine, Ser 0.75; Platelets 138; Potassium 4.4; Sodium 137 11/20/2015: ALT 29    Lipid Panel Lab Results  Component Value Date   CHOL 161 11/20/2015   HDL 46 11/20/2015   LDLCALC 86 11/20/2015   TRIG 143 11/20/2015      Wt Readings from Last 3 Encounters:  02/23/16 255 lb (115.7 kg)  01/18/16 262 lb 3.2 oz (118.9 kg)  12/30/15 265 lb 4 oz (120.3 kg)       ASSESSMENT AND PLAN:  Essential hypertension - Plan: EKG 12-Lead Blood pressure markedly elevated. Long discussion concerning various treatment options, other medications we can use.  Recommended he add isosorbide 30 mg daily to his regiment Would take the beta blocker a.m. and p.m., lisinopril in the evening Other options for blood pressure control  include clonidine, Cardura, hydralazine among others We'll try to avoid calcium channel blockers given leg swelling. He could take calcium channel blocker as needed for severe hypertension He will call later this week with blood pressure numbers to follow-up  Premature beats - Plan: EKG 12-Lead He is having palpitations, we'll try to work on blood pressure control to see if this helps his symptoms  Pure hypercholesterolemia - Plan: EKG 12-Lead Cholesterol is at goal on the current lipid regimen. No changes to the medications were made.  Typical atrial flutter (HCC) - Plan: EKG 12-Lead Maintaining normal sinus rhythm, no changes to his medications  Coronary artery disease involving native coronary artery of native heart without angina pectoris - Plan: EKG 12-Lead Currently with no symptoms of angina. No further workup at this time. Continue current medication regimen.  Anxiety He is interested in restarting anxiety medication, buspar He was on this in the past, will recommended he talk with primary care    Total encounter time more than 25 minutes  Greater than 50% was spent in counseling and coordination of care with the patient   Disposition:   F/U  6 months   Orders Placed This Encounter  Procedures  . EKG 12-Lead     Signed, Esmond Plants, M.D., Ph.D. 02/23/2016  Rafael Capo, [place]

## 2016-03-08 ENCOUNTER — Telehealth (INDEPENDENT_AMBULATORY_CARE_PROVIDER_SITE_OTHER): Payer: Self-pay | Admitting: Physical Medicine and Rehabilitation

## 2016-03-08 DIAGNOSIS — R202 Paresthesia of skin: Principal | ICD-10-CM

## 2016-03-08 DIAGNOSIS — R200 Anesthesia of skin: Secondary | ICD-10-CM

## 2016-03-08 NOTE — Telephone Encounter (Signed)
Does he want to se a Garment/textile technologist or Neurosurgeon?

## 2016-03-08 NOTE — Telephone Encounter (Signed)
I made the referral to Dr. Floyde Parkins at [facility] neurology. He may want to see if Dr. Durward Fortes can get him into chronic pain management for medication management.

## 2016-03-08 NOTE — Telephone Encounter (Signed)
Patient has called and left 2 messages today. After listening to the first message, I sent a message to Dr. Ernestina Patches to advise. Dr. Ernestina Patches has not replied as he has been seeing patients all morning, so I did not return the patient's call with no information to give him. He called back and left a second message requesting a call back today. I called him back twice to let him know that I was waiting to hear from Dr. Ernestina Patches, but both times there has been no answer and no option to leave a message.

## 2016-03-09 ENCOUNTER — Other Ambulatory Visit (INDEPENDENT_AMBULATORY_CARE_PROVIDER_SITE_OTHER): Payer: Self-pay | Admitting: Physical Medicine and Rehabilitation

## 2016-03-09 NOTE — Telephone Encounter (Signed)
Appt with Dr Louanne Skye

## 2016-03-09 NOTE — Telephone Encounter (Signed)
Please advise. Can his PCP refer him?

## 2016-03-09 NOTE — Telephone Encounter (Signed)
Please schedule per PW

## 2016-03-10 NOTE — Telephone Encounter (Signed)
Can you please schedule next available with Dr. Louanne Skye for New back

## 2016-03-10 NOTE — Telephone Encounter (Signed)
Please advise 

## 2016-03-23 ENCOUNTER — Ambulatory Visit (INDEPENDENT_AMBULATORY_CARE_PROVIDER_SITE_OTHER): Payer: 59 | Admitting: Neurology

## 2016-03-23 ENCOUNTER — Encounter: Payer: Self-pay | Admitting: Neurology

## 2016-03-23 VITALS — BP 200/90 | HR 58 | Ht 72.0 in | Wt 261.5 lb

## 2016-03-23 DIAGNOSIS — R202 Paresthesia of skin: Secondary | ICD-10-CM | POA: Insufficient documentation

## 2016-03-23 DIAGNOSIS — R251 Tremor, unspecified: Secondary | ICD-10-CM | POA: Insufficient documentation

## 2016-03-23 DIAGNOSIS — F419 Anxiety disorder, unspecified: Secondary | ICD-10-CM

## 2016-03-23 NOTE — Progress Notes (Signed)
Reason for visit: Tremor, numbness of the legs  Referring physician: Dr. Meliton Rattan is a 61 y.o. male  History of present illness:  Mr. Fatheree is a 61 year old right-handed white male with a history of chronic low back pain and leg discomfort. The patient has reported some problems with lower extremity numbness for several years that is gradually worsening over time. The patient feels unsteady on his feet at times, he is unsteady in the shower when he closes his eyes to wash his hair. He has not had any falls, he may stumble on occasion. The patient does not use a cane for ambulation. He had been getting epidural steroid injections for his low back, followed through orthopedic surgery. These injections have been stopped as he is gone on blood thinners for atrial fibrillation. The patient is mainly concerned with a tremor that has developed over the last 6 months. He indicates that he has some jaw tremor and tremor in both arms that affects his handwriting, he denies that he has significant issues with feeding himself. He may have good days and bad days with the tremor, and the tremor may be worse when he feels anxious or nervous. He continues to drink alcohol, he averages about 4 beers daily. The patient denies a family history of tremor. The patient denies any burning or stinging sensations in the feet in the evening hours. He comes to the office today for an evaluation. He denies any new weakness of the extremities, he has chronic numbness and weakness of the right hand associated with a prior ulnar neuropathy. He does have urinary frequency associated with a prior history of prostate cancer, he denies any problems controlling the bowels. He does have some occasional neck discomfort.  Past Medical History:  Diagnosis Date  . Abnormality of gait 09/03/2012  . Alcohol abuse   . Anxiety   . Arthritis   . Arthritis   . Esophageal stricture   . GERD (gastroesophageal reflux disease)     . Gout   . Hyperlipidemia   . Hypertension   . Internal hemorrhoids   . Lesion of ulnar nerve 09/03/2012   Right ulnar neuropathy  . Murmur, cardiac   . Palpitations   . Prostate cancer ([place]) 2016   treated with radioactive seed implant  . Seizures ([place])    pt states he had seizure 12/14  . Tubular adenoma of colon 03/2008    Past Surgical History:  Procedure Laterality Date  . CATARACT EXTRACTION W/PHACO Right 07/03/2014   Procedure: CATARACT EXTRACTION PHACO AND INTRAOCULAR LENS PLACEMENT (IOC);  Surgeon: Lyla Glassing, MD;  Location: [facility] ORS;  Service: Ophthalmology;  Laterality: Right;  lot # [id number] H [place]: 00:32.3 AP% 9.1 CDE: 2.92  . CIRCUMCISION    . COLONOSCOPY    . ELBOW SURGERY    . ELECTROPHYSIOLOGIC STUDY N/A 11/02/2015   Procedure: CARDIOVERSION;  Surgeon: Minna Merritts, MD;  Location: [facility] ORS;  Service: Cardiovascular;  Laterality: N/A;  . EYE SURGERY    . HAND SURGERY Left   . SHOULDER SURGERY      Family History  Problem Relation Age of Onset  . Diabetes Father   . Kidney disease Father   . Lung cancer Father     smoker  . Hyperlipidemia Father   . Hypertension Father   . Lung cancer Mother     smoke  . Hypertension Mother   . Colon cancer Neg Hx   . Pancreatic cancer Neg Hx   .  Stomach cancer Neg Hx     Social history:  reports that he has been smoking Cigarettes.  He has smoked for the past 24.00 years. He has never used smokeless tobacco. He reports that he drinks about 8.4 oz of alcohol per week . He reports that he does not use drugs.  Medications:  Prior to Admission medications   Medication Sig Start Date End Date Taking? Authorizing Provider  Alirocumab (PRALUENT) 150 MG/ML SOPN Inject 1 pen into the skin every 14 (fourteen) days. 12/07/15  Yes Minna Merritts, MD  apixaban (ELIQUIS) 5 MG TABS tablet Take 1 tablet (5 mg total) by mouth 2 (two) times daily. 10/08/15  Yes Minna Merritts, MD  colchicine 0.6 MG tablet Take 0.6 mg by  mouth as needed (gout). For gout flare ups   Yes Historical Provider, MD  cyclobenzaprine (FLEXERIL) 10 MG tablet Take 1 tablet (10 mg total) by mouth 3 (three) times daily as needed for muscle spasms. 11/22/15  Yes Jami L Hagler, PA-C  hydrOXYzine (ATARAX/VISTARIL) 50 MG tablet Take 50 mg by mouth at bedtime. Reported on 02/04/2015   Yes Historical Provider, MD  isosorbide mononitrate (IMDUR) 30 MG 24 hr tablet Take 1 tablet (30 mg total) by mouth daily. 02/23/16 02/22/17 Yes Minna Merritts, MD  lisinopril (PRINIVIL,ZESTRIL) 40 MG tablet Take 1 tablet (40 mg total) by mouth daily. 07/16/15  Yes Minna Merritts, MD  milk thistle 175 MG tablet Take 350 mg by mouth daily.    Yes Historical Provider, MD  Multiple Vitamin (MULTIVITAMIN) tablet Take 1 tablet by mouth daily. Centrum Plus -Take 1 daily   Yes Historical Provider, MD  Nebivolol HCl (BYSTOLIC) 20 MG TABS Take 1 tablet (20 mg total) by mouth daily. 12/16/15  Yes Minna Merritts, MD  tamsulosin (FLOMAX) 0.4 MG CAPS capsule Take 0.4 mg by mouth daily.    Yes Historical Provider, MD  traMADol (ULTRAM) 50 MG tablet TAKE 1 TABLET EVERY 6-8 HOURS AS NEEDED FOR PAIN WITH EXTRA STRENGTH TYLENOL 03/10/16  Yes Magnus Sinning, MD  triamcinolone cream (KENALOG) 0.1 %  12/24/14  Yes Historical Provider, MD  varenicline (CHANTIX CONTINUING MONTH PAK) 1 MG tablet Take 1 tablet (1 mg total) by mouth 2 (two) times daily. 06/15/15  Yes Minna Merritts, MD  VOLTAREN 1 % GEL Apply 2 g topically as needed (knees).  04/02/13  Yes Historical Provider, MD      Allergies  Allergen Reactions  . Crestor [Rosuvastatin] Other (See Comments)    Bloating, swelling of legs, fatty liver  . Hydrocodone Bitartrate Er   . Lipitor [Atorvastatin] Other (See Comments)    Myalgias    ROS:  Out of a complete 14 system review of symptoms, the patient complains only of the following symptoms, and all other reviewed systems are negative.  Eye redness Cough, chest  tightness Heart murmur Abdominal pain Restless legs, frequent waking Difficulty urinating, frequency of urination Moles, itching Bruising easily Headache, tremors Depression, anxiety, hyperactivity  Blood pressure (!) 200/90, pulse (!) 58, height 6' (1.829 m), weight 261 lb 8 oz (118.6 kg), SpO2 97 %.  Physical Exam  General: The patient is alert and cooperative at the time of the examination. The patient is moderately to markedly obese.  Eyes: Pupils are equal, round, and reactive to light. Discs are flat bilaterally.  Neck: The neck is supple, no carotid bruits are noted.  Respiratory: The respiratory examination is clear.  Cardiovascular: The cardiovascular examination reveals a regular  rate and rhythm, no obvious murmurs or rubs are noted.  Skin: Extremities are without significant edema.  Neurologic Exam  Mental status: The patient is alert and oriented x 3 at the time of the examination. The patient has apparent normal recent and remote memory, with an apparently normal attention span and concentration ability.  Cranial nerves: Facial symmetry is present. There is good sensation of the face to pinprick and soft touch bilaterally. The strength of the facial muscles and the muscles to head turning and shoulder shrug are normal bilaterally. Speech is well enunciated, no aphasia or dysarthria is noted. Extraocular movements are full. Visual fields are full. The tongue is midline, and the patient has symmetric elevation of the soft palate. No obvious hearing deficits are noted.  Motor: The motor testing reveals 5 over 5 strength of all 4 extremities, with exception of some weakness of intrinsic muscles of the right hand. Good symmetric motor tone is noted throughout.  Sensory: Sensory testing is intact to pinprick, soft touch, vibration sensation, and position sense on all 4 extremities, with exception of some decreased pinprick sensation on the left arm and right leg. There is a  stocking pattern pinprick sensory deficit up to the knees bilaterally. No evidence of extinction is noted.  Coordination: Cerebellar testing reveals good finger-nose-finger and heel-to-shin bilaterally. The patient does have an intention tremor with finger-nose-finger bilaterally, he does have tremor translated into the handwriting when drawing a spiral.  Gait and station: Gait is slightly wide-based, can gait is unsteady. The patient is able to walk on heels and the toes bilaterally. Romberg is negative. No drift is seen.  Reflexes: Deep tendon reflexes are symmetric, but are depressed bilaterally. Toes are downgoing bilaterally.   Assessment/Plan:  1. Chronic low back pain, leg discomfort  2. Leg numbness and paresthesias, possible peripheral neuropathy  3. Essential tremor  The patient comes to the office today with 2 issues, he is mainly concerned about a tremor that affects the arms primarily. He is also concerned about some numbness in the legs. The patient will be sent for blood work today, nerve conduction studies will be done on both legs and EMG will be done on one leg. If the patient desires to go on medication for the tremor we will institute this therapy. He will follow-up in 6 months, he will come back sooner for the EMG evaluation.  Jill Alexanders MD 03/23/2016 9:00 AM  [facility] [address] [place], [place] [address]  Phone [phone] Fax [phone]

## 2016-03-23 NOTE — Patient Instructions (Signed)
   We will check blood work today, and get EMG and NCV study to look at the nerve function of the legs.

## 2016-03-24 ENCOUNTER — Other Ambulatory Visit: Payer: Self-pay | Admitting: Cardiovascular Disease

## 2016-03-24 ENCOUNTER — Ambulatory Visit: Payer: 59 | Admitting: Gastroenterology

## 2016-03-28 DIAGNOSIS — Z961 Presence of intraocular lens: Secondary | ICD-10-CM | POA: Diagnosis not present

## 2016-03-30 LAB — MULTIPLE MYELOMA PANEL, SERUM
ALBUMIN SERPL ELPH-MCNC: 3.6 g/dL (ref 2.9–4.4)
Albumin/Glob SerPl: 0.9 (ref 0.7–1.7)
Alpha 1: 0.3 g/dL (ref 0.0–0.4)
Alpha2 Glob SerPl Elph-Mcnc: 0.6 g/dL (ref 0.4–1.0)
B-GLOBULIN SERPL ELPH-MCNC: 1.2 g/dL (ref 0.7–1.3)
Gamma Glob SerPl Elph-Mcnc: 2.1 g/dL — ABNORMAL HIGH (ref 0.4–1.8)
Globulin, Total: 4.2 g/dL — ABNORMAL HIGH (ref 2.2–3.9)
IGG (IMMUNOGLOBIN G), SERUM: 1405 mg/dL (ref 700–1600)
IGM (IMMUNOGLOBULIN M), SRM: 291 mg/dL — AB (ref 20–172)
IgA/Immunoglobulin A, Serum: 798 mg/dL — ABNORMAL HIGH (ref 90–386)
Total Protein: 7.8 g/dL (ref 6.0–8.5)

## 2016-03-30 LAB — ENA+DNA/DS+SJORGEN'S
ENA SM Ab Ser-aCnc: <0.2 AI (ref 0.0–0.9)
ENA SSA (RO) AB: 0.2 AI (ref 0.0–0.9)
dsDNA Ab: 11 IU/mL — ABNORMAL HIGH (ref 0–9)

## 2016-03-30 LAB — VITAMIN B12

## 2016-03-30 LAB — ANA W/REFLEX: Anti Nuclear Antibody(ANA): POSITIVE — AB

## 2016-03-30 LAB — ANGIOTENSIN CONVERTING ENZYME: Angio Convert Enzyme: 17 U/L (ref 14–82)

## 2016-03-30 LAB — SEDIMENTATION RATE: Sed Rate: 38 mm/hr — ABNORMAL HIGH (ref 0–30)

## 2016-03-30 LAB — TSH: TSH: 1.42 u[IU]/mL (ref 0.450–4.500)

## 2016-03-30 LAB — B. BURGDORFI ANTIBODIES: Lyme IgG/IgM Ab: <0.91 {ISR} (ref 0.00–0.90)

## 2016-03-30 LAB — RHEUMATOID FACTOR: Rhuematoid fact SerPl-aCnc: 14.1 IU/mL — ABNORMAL HIGH (ref 0.0–13.9)

## 2016-04-06 ENCOUNTER — Telehealth: Payer: Self-pay | Admitting: Neurology

## 2016-04-06 NOTE — Telephone Encounter (Signed)
Pt request lab results °

## 2016-04-07 NOTE — Telephone Encounter (Signed)
I called patient. The blood test results were released to him on my chart.  The patient does have minor abnormalities, he has a positive and a with a minor elevation in the RNP antibody, he has a very minimal elevation in the rheumatoid factor and a minimal elevation in the sedimentation rate. Overall, not clear with a significant these changes are as they are quite mild, should be followed over time.  He will follow up for EMG and nerve conduction study evaluation.

## 2016-04-07 NOTE — Telephone Encounter (Signed)
Dr Jannifer Franklin- please advise. He had labs drawn 03/23/16 I don't think I received a result note on this patient

## 2016-04-18 ENCOUNTER — Other Ambulatory Visit (INDEPENDENT_AMBULATORY_CARE_PROVIDER_SITE_OTHER): Payer: Self-pay | Admitting: Physical Medicine and Rehabilitation

## 2016-04-18 NOTE — Telephone Encounter (Signed)
Faxed to pharmacy

## 2016-04-18 NOTE — Telephone Encounter (Signed)
Please advise 

## 2016-04-19 ENCOUNTER — Telehealth: Payer: Self-pay | Admitting: Cardiovascular Disease

## 2016-04-19 MED ORDER — CLONIDINE HCL 0.1 MG PO TABS: 0.1000 mg | ORAL_TABLET | Freq: Two times a day (BID) | ORAL | 11 refills | Status: DC

## 2016-04-19 NOTE — Telephone Encounter (Signed)
Returned call to patient. Patient said his BP has increasing since stopping his isosorbide 1 week ago. He started the isosorbide after OV with [place] on 02/23/16 and took it up to a week ago. He said it maintained adequate control of BP; however, he could not tolerate the headache that would wake him up most morning around 0500 and linger throughout the day. Patient was taking in the morning Lisinopril 40 mg and Bystolic 40mg . In the evening he would take the isosorbide 30mg  and Bystolic 20mg . This was working but he cannot tolerate the headache from the isosorbide.  Blood pressure readings are as follows: 4/7 - Morning before meds 182/85, about 2 hr after meds 123/62 4/8 - After morning meds, 154/73, lunch time 170/73, and pm after meds 158/76 4/9 - 2 hours After morning meds, 158/73 4/10 - 2 hours After morning meds, 158/73  Since shock, he also feels a "fluttering" in his heart most mornings as he is waking up. It only lasts a couple minutes. He denies chest pain, SOB, nausea, vomiting or sweating at this time. Offered appt to discuss with Dr Rockey Situ this afternoon. He said he already had plans and could not come today or tomorrow but will wait to see what he says via phone.  Plan at last OV on 02/23/16: "Essential hypertension - Plan: EKG 12-Lead Blood pressure markedly elevated. Long discussion concerning various treatment options, other medications we can use.  Recommended he add isosorbide 30 mg daily to his regiment Would take the beta blocker a.m. and p.m., lisinopril in the evening Other options for blood pressure control include clonidine, Cardura, hydralazine among others We'll try to avoid calcium channel blockers given leg swelling. He could take calcium channel blocker as needed for severe hypertension He will call later this week with blood pressure numbers to follow-up" Routed to Dr Rockey Situ.

## 2016-04-19 NOTE — Telephone Encounter (Signed)
Would consider decreasing bystolic down to no more than 40 daily He could take this 20 mgrams twice a day Okay to hold isosorbide Could start clonidine 0.1 mg twice a day for blood pressure control

## 2016-04-19 NOTE — Telephone Encounter (Signed)
Pt states he is still having heart flutter, even after his "shock".  Pt c/o BP issue: STAT if pt c/o blurred vision, one-sided weakness or slurred speech  1. What are your last 5 BP readings?  4/9pm-158/73  4/8 am -154/73, lunch time 170/73, pm after meds 158/76 4/7 am -182/85, after meds 123/62  2. Are you having any other symptoms (ex. Dizziness, headache, blurred vision, passed out)? Flutters, no pain, headache  3. What is your BP issue? elevated

## 2016-04-19 NOTE — Telephone Encounter (Signed)
Left message on pt's vm w/ Dr. Donivan Scull recommendation.  Asked him to call back w/ any further questions or concerns.

## 2016-04-20 ENCOUNTER — Ambulatory Visit (INDEPENDENT_AMBULATORY_CARE_PROVIDER_SITE_OTHER): Payer: Self-pay | Admitting: Neurology

## 2016-04-20 ENCOUNTER — Encounter: Payer: Self-pay | Admitting: Neurology

## 2016-04-20 ENCOUNTER — Ambulatory Visit (INDEPENDENT_AMBULATORY_CARE_PROVIDER_SITE_OTHER): Payer: 59 | Admitting: Neurology

## 2016-04-20 DIAGNOSIS — R202 Paresthesia of skin: Secondary | ICD-10-CM

## 2016-04-20 DIAGNOSIS — R251 Tremor, unspecified: Secondary | ICD-10-CM

## 2016-04-20 MED ORDER — TOPIRAMATE 25 MG PO TABS: 25.0000 mg | ORAL_TABLET | Freq: Two times a day (BID) | ORAL | 3 refills | Status: DC

## 2016-04-20 NOTE — Procedures (Signed)
     HISTORY:  Peter [name] is a 61 year old gentleman with a history of chronic low back pain. The patient has had some numbness and tingly sensations in the legs and feet, he is being evaluated for a possible neuropathy or a lumbosacral radiculopathy.  NERVE CONDUCTION STUDIES:  Nerve conduction studies were performed on both lower extremities. The distal motor latencies and motor amplitudes for the peroneal and posterior tibial nerves were within normal limits. The nerve conduction velocities for these nerves were also normal. The H reflex latencies were unobtainable bilaterally. The sensory latencies for the peroneal nerves were within normal limits.   EMG STUDIES:  EMG study was performed on the left lower extremity:  The tibialis anterior muscle reveals 2 to 4K motor units with full recruitment. No fibrillations or positive waves were seen. The peroneus tertius muscle reveals 2 to 4K motor units with full recruitment. No fibrillations or positive waves were seen. The medial gastrocnemius muscle reveals 1 to 3K motor units with full recruitment. No fibrillations or positive waves were seen. The vastus lateralis muscle reveals 2 to 4K motor units with full recruitment. No fibrillations or positive waves were seen. The iliopsoas muscle reveals 2 to 4K motor units with full recruitment. No fibrillations or positive waves were seen. The biceps femoris muscle (long head) reveals 2 to 4K motor units with full recruitment. No fibrillations or positive waves were seen. The lumbosacral paraspinal muscles were tested at 3 levels, and revealed no abnormalities of insertional activity at all 3 levels tested. There was good relaxation.   IMPRESSION:  Nerve conduction studies done on both lower extremities were unremarkable, without evidence of a peripheral neuropathy. EMG evaluation of the left lower extremity was unremarkable, no evidence of an overlying lumbosacral radiculopathy was  noted.  Jill Alexanders MD 04/20/2016 11:18 AM  [facility] [address] [place], [place] [address]  Phone [phone] Fax [phone]

## 2016-04-20 NOTE — Telephone Encounter (Signed)
Pt called to clarify instructions last night and find out which pharmacy clonidine was sent to. Asked him to call back w/ any further questions or concerns.

## 2016-04-20 NOTE — Progress Notes (Signed)
The patient comes in for EMG and nerve conduction study evaluation. The study was unremarkable exception that the H reflex latencies were unobtainable bilaterally. No evidence of a peripheral neuropathy is seen. EMG evaluation of the left lower extremity was unremarkable.  The patient wishes to go on medication for tremor, we will initiate Topamax taking 25 mg daily for 2 weeks then going to 25 mg twice daily. He will call for any dose adjustments.

## 2016-04-20 NOTE — Progress Notes (Signed)
Please refer to EMG and nerve conduction study procedure note. 

## 2016-04-26 ENCOUNTER — Telehealth: Payer: Self-pay | Admitting: Neurology

## 2016-04-26 MED ORDER — GABAPENTIN 100 MG PO CAPS: 100.0000 mg | ORAL_CAPSULE | Freq: Three times a day (TID) | ORAL | 1 refills | Status: DC

## 2016-04-26 NOTE — Telephone Encounter (Signed)
I called the patient. The patient has had difficulty tolerating the Topamax. He will stop the drug, we will start gabapentin and low-dose for the back pain as well as for the tremor. He will call for any dose adjustments.  Primidone will interact with the Ellquis.  Clonazepam may be used in the future if needed.

## 2016-04-26 NOTE — Addendum Note (Signed)
Addended by: Kathrynn Ducking on: 04/26/2016 02:41 PM   Modules accepted: Orders

## 2016-04-26 NOTE — Telephone Encounter (Signed)
Patient has been taking topiramate (TOPAMAX) 25 MG tablet for a week and it is not agreeing with him and also not helping with tremors. He says it makes him feel like he has the flu.

## 2016-04-27 ENCOUNTER — Ambulatory Visit: Payer: 59 | Admitting: Gastroenterology

## 2016-04-28 ENCOUNTER — Encounter: Payer: Self-pay | Admitting: *Deleted

## 2016-05-03 ENCOUNTER — Ambulatory Visit: Payer: 59 | Admitting: Family Medicine

## 2016-05-04 ENCOUNTER — Ambulatory Visit (INDEPENDENT_AMBULATORY_CARE_PROVIDER_SITE_OTHER): Payer: 59 | Admitting: Family Medicine

## 2016-05-04 ENCOUNTER — Encounter: Payer: Self-pay | Admitting: Family Medicine

## 2016-05-04 DIAGNOSIS — T148XXA Other injury of unspecified body region, initial encounter: Secondary | ICD-10-CM | POA: Diagnosis not present

## 2016-05-04 NOTE — Progress Notes (Signed)
Pre visit review using our clinic review tool, if applicable. No additional management support is needed unless otherwise documented below in the visit note. 

## 2016-05-04 NOTE — Progress Notes (Signed)
Subjective:  Patient ID: Peter [name], male    DOB: April 09, 1955  Age: 61 y.o. MRN: [id number]  CC: ? Spider bited  HPI:  61 year old male with an extensive past medical history presents with the above concern.  Patient states that on Sunday he noticed an area of concern on his left forearm. He states that it looked like a "pimple". He states that he "popped it". He subsequent noted some redness & purple discoloration. He also subsequently developed a scab. It is slightly tender. He has no fever or chills. No other systemic signs or symptoms. He states that he feels well otherwise. He is concerned that he may have a spider bite. He states that his wife was bitten by a brown recluse spider a few years ago and this appears similar in color/character. No other complaints or concerns at this time.  Social Hx   Social History   Social History  . Marital status: Married    Spouse name: N/A  . Number of children: 0  . Years of education: hs   Occupational History  . Retired [employer]  . 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 [employer]   Social History Main Topics  . Smoking status: Current Every Day Smoker    Years: 24.00    Types: Cigarettes  . Smokeless tobacco: Never Used     Comment: down to 2 ciggs a day.  . Alcohol use 8.4 oz/week    14 Cans of beer per week     Comment: 4 beers per night  . Drug use: No     Comment: 1970's occ.   Marland Kitchen Sexual activity: Not Asked   Other Topics Concern  . None   Social History Narrative   Lives with wife   Caffeine use: Drinks one cup coffee per morning   No soda or tea       Review of Systems  Constitutional: Negative.   Skin: Positive for wound.   Objective:  BP (!) 148/86   Pulse 61   Temp 97.9 F (36.6 C) (Oral)   Wt 257 lb 6 oz (116.7 kg)   SpO2 96%   BMI 34.91 kg/m   BP/Weight 05/04/2016 03/23/2016 5/85/2778  Systolic BP 242 353 614  Diastolic BP 86 90 99  Wt. (Lbs) 257.38 261.5 255  BMI 34.91 35.47 34.58   Physical Exam    Constitutional: He is oriented to person, place, and time. He appears well-developed. No distress.  Cardiovascular: Normal rate and regular rhythm.   Murmur heard. Pulmonary/Chest: Effort normal and breath sounds normal.  Neurological: He is alert and oriented to person, place, and time.  Skin:  Left forearm - small eschar noted with surrounding bruising.  Psychiatric: He has a normal mood and affect.  Vitals reviewed.   Lab Results  Component Value Date   WBC 11.2 (H) 10/28/2015   HGB 14.2 12/22/2012   HCT 37.9 10/28/2015   PLT 138 (L) 10/28/2015   GLUCOSE 127 (H) 10/28/2015   CHOL 161 11/20/2015   TRIG 143 11/20/2015   HDL 46 11/20/2015   LDLCALC 86 11/20/2015   ALT 29 11/20/2015   AST 60 (H) 11/20/2015   NA 137 10/28/2015   K 4.4 10/28/2015   CL 98 10/28/2015   CREATININE 0.75 (L) 10/28/2015   BUN 10 10/28/2015   CO2 21 10/28/2015   TSH 1.420 03/23/2016   INR 1.3 (H) 10/28/2015   HGBA1C 5.5 02/05/2015    Assessment & Plan:   Problem List Items Addressed  This Visit    Bruise    New problem. Area of concern appears to be a contusion/bruise. Supportive care.         Follow-up: PRN  [place]

## 2016-05-04 NOTE — Assessment & Plan Note (Signed)
New problem. Area of concern appears to be a contusion/bruise. Supportive care.

## 2016-05-04 NOTE — Patient Instructions (Signed)
Keep an eye on it. It looks well.  Follow up as scheduled.  Take care  Dr. Lacinda Axon

## 2016-05-11 ENCOUNTER — Telehealth: Payer: Self-pay | Admitting: Cardiovascular Disease

## 2016-05-11 NOTE — Telephone Encounter (Signed)
Pt did schedule to see Dr. Rockey Situ tomorrow 5/3 at 10:40am

## 2016-05-11 NOTE — Telephone Encounter (Signed)
Left message on machine for patient to contact the office.   

## 2016-05-11 NOTE — Telephone Encounter (Signed)
Pt states his heart is "fluttering", also states he has and episode where "my body makes me take a deep breath". States this has been going on for about 1 month. This does not happen all the time. Pt sates it is ok to leave a message.

## 2016-05-11 NOTE — Progress Notes (Signed)
Cardiology Office Note  Date:  05/12/2016   ID:  KAYHAN BOARDLEY, DOB Feb 21, 1955, MRN [id number]  PCP:  Coral Spikes, DO   Chief Complaint  Patient presents with  . other    Pt. c/o fluttering in chest for the past 2-3 months. Meds reviewed by the pt. verbally.     HPI:   Mr. Fonder is a 61 year old gentleman with  long history of smoking,  hypertension,  daily alcohol drinking,  prostate cancer ,  statin intolerance ,    underlying liver dysfunction, elevated total bilirubin CAD, CT coronary calcium score 2778 in 04/2015 Stress test 07/2014 no ischemia Cardioversion 11/02/2015 For atrial flutter who presents for follow-up of his coronary artery disease, hypertension, hyperlipidemia  In follow-up today he reports that his blood pressure is higher in the morning, lower in the evening He is having some fluttering in the morning when he wakes up Blood pressure sometimes very low in the afternoon evening, systolic pressure in the 62V bystolic 40 mg in Am Lisinopril in the morning Tolerating clonidine 0.1 mg twice a day He did not tolerate isosorbide  On chantix, down to 2 cigs Weight is down 10 pounds in the past several months, watching his diet Still drinks several drinks of alcohol per day  echocardiogram 02/2016 showing normal LV function  Chronic back pain He went to the ER for back pain 11/22/2015 Started On tramadol PRN  Denies any chest pain concerning for angina  EKG personally reviewed by myself on todays visit Shows normal sinus rhythm with rate 68 bpm left axis deviation no significant ST or T-wave changes  Other past medical history reviewed He is tolerating praluent , dramatic improvement in his cholesterol down to 160 from 260  Limited by back pain , was told he could get a "shot" "shaking" for 2 years since prostate seeds, stopped librium Still with heavy ETOH, previous notes indicating 4-5 beers per day Frequent nocturia Has been taking lisinopril 40  mg x 2, presumably because blood pressure was running high, now running out of his medication. Did not call our office  Previous stress test July 2016 showing no ischemia CT scan of the chest, CT coronary calcium score discussed with him in detail, images pulled up in the office. His scores close to 3000. He denies any anginal symptoms. Images show diffuse heavy calcification/calcified atherosclerosis in the LAD, diagonal branch, ostial and proximal RCA. No significant disease noted in the left circumflex.   he reports being debilitated when he was on Crestor, Lipitor He had diffuse muscle pain in his legs, has tried different doses, does not want to retry a statin given the debilitating side effects. Feels it made it impossible to get around, on top of his arthritis, severe knee pain, it was just too much  CT scan of the abdomen shows scattered diffuse mild aortic atherosclerosis.  Lab work reviewed with him showing hemoglobin A1c 5.5 , total cholesterol more than 230   PMH:   has a past medical history of Abnormality of gait (09/03/2012); Alcohol abuse; Anxiety; Arthritis; Arthritis; Esophageal stricture; GERD (gastroesophageal reflux disease); Gout; Hyperlipidemia; Hypertension; Internal hemorrhoids; Lesion of ulnar nerve (09/03/2012); Murmur, cardiac; Palpitations; Prostate cancer ([place]) (2016); Seizures ([place]); and Tubular adenoma of colon (03/2008).  PSH:    Past Surgical History:  Procedure Laterality Date  . CATARACT EXTRACTION W/PHACO Right 07/03/2014   Procedure: CATARACT EXTRACTION PHACO AND INTRAOCULAR LENS PLACEMENT (IOC);  Surgeon: Lyla Glassing, MD;  Location: [facility] ORS;  Service:  Ophthalmology;  Laterality: Right;  lot # [id number] H [place]: 00:32.3 AP% 9.1 CDE: 2.92  . CIRCUMCISION    . COLONOSCOPY    . ELBOW SURGERY    . ELECTROPHYSIOLOGIC STUDY N/A 11/02/2015   Procedure: CARDIOVERSION;  Surgeon: Minna Merritts, MD;  Location: [facility] ORS;  Service: Cardiovascular;   Laterality: N/A;  . EYE SURGERY    . HAND SURGERY Left   . SHOULDER SURGERY      Current Outpatient Prescriptions  Medication Sig Dispense Refill  . Alirocumab (PRALUENT) 150 MG/ML SOPN Inject 1 pen into the skin every 14 (fourteen) days. 2 pen 11  . apixaban (ELIQUIS) 5 MG TABS tablet Take 1 tablet (5 mg total) by mouth 2 (two) times daily. 180 tablet 3  . cloNIDine (CATAPRES) 0.1 MG tablet Take 1 tablet (0.1 mg total) by mouth 2 (two) times daily. 60 tablet 11  . colchicine 0.6 MG tablet Take 0.6 mg by mouth as needed (gout). For gout flare ups    . cyclobenzaprine (FLEXERIL) 10 MG tablet Take 1 tablet (10 mg total) by mouth 3 (three) times daily as needed for muscle spasms. 21 tablet 0  . gabapentin (NEURONTIN) 100 MG capsule Take 1 capsule (100 mg total) by mouth 3 (three) times daily. 90 capsule 1  . hydrOXYzine (ATARAX/VISTARIL) 50 MG tablet Take 50 mg by mouth at bedtime. Reported on 02/04/2015    . lisinopril (PRINIVIL,ZESTRIL) 40 MG tablet TAKE 1 TABLET (40 MG TOTAL) BY MOUTH DAILY. 90 tablet 2  . milk thistle 175 MG tablet Take 350 mg by mouth daily.     . Multiple Vitamin (MULTIVITAMIN) tablet Take 1 tablet by mouth daily. Centrum Plus -Take 1 daily    . Nebivolol HCl (BYSTOLIC) 20 MG TABS Take 1 tablet (20 mg total) by mouth daily. 90 tablet 3  . tamsulosin (FLOMAX) 0.4 MG CAPS capsule Take 0.4 mg by mouth daily.     . traMADol (ULTRAM) 50 MG tablet TAKE 1 TABLET BY MOUTH EVERY 6 TO 8 HOURS AS NEEDED FOR PAIN. TAKE WITH EXTRA STRENGTH TYLENOL 60 tablet 0  . triamcinolone cream (KENALOG) 0.1 %     . varenicline (CHANTIX CONTINUING MONTH PAK) 1 MG tablet Take 1 tablet (1 mg total) by mouth 2 (two) times daily. 60 tablet 6  . VOLTAREN 1 % GEL Apply 2 g topically as needed (knees).      No current facility-administered medications for this visit.      Allergies:   Crestor [rosuvastatin]; Hydrocodone bitartrate er; Lipitor [atorvastatin]; and Topamax [topiramate]   Social  History:  The patient  reports that he has been smoking Cigarettes.  He has smoked for the past 24.00 years. He has never used smokeless tobacco. He reports that he drinks about 8.4 oz of alcohol per week . He reports that he does not use drugs.   Family History:   family history includes Diabetes in his father; Hyperlipidemia in his father; Hypertension in his father and mother; Kidney disease in his father; Lung cancer in his father and mother.    Review of Systems: Review of Systems  Constitutional: Negative.   Respiratory: Negative.   Cardiovascular: Positive for palpitations.  Gastrointestinal: Negative.   Musculoskeletal: Negative.   Neurological: Negative.   Psychiatric/Behavioral: The patient is nervous/anxious.   All other systems reviewed and are negative.    PHYSICAL EXAM: VS:  BP (!) 164/90 (BP Location: Left Arm, Patient Position: Sitting, Cuff Size: Large)   Pulse 68   Ht  6\' 1"  (1.854 m)   Wt 252 lb 4 oz (114.4 kg)   BMI 33.28 kg/m  , BMI Body mass index is 33.28 kg/m.  GEN: Well nourished, well developed, in no acute distress , obese. Tremor HEENT: normal  Neck: no JVD, carotid bruits, or masses Cardiac: RRR; 2+ SEM RSB murmurs, rubs, or gallops,no edema  Respiratory:  clear to auscultation bilaterally, normal work of breathing GI: soft, nontender, nondistended, + BS MS: no deformity or atrophy  Skin: warm and dry, no rash Neuro:  Strength and sensation are intact Psych: euthymic mood, full affect    Recent Labs: 10/28/2015: BUN 10; Creatinine, Ser 0.75; Platelets 138; Potassium 4.4; Sodium 137 11/20/2015: ALT 29 03/23/2016: TSH 1.420    Lipid Panel Lab Results  Component Value Date   CHOL 161 11/20/2015   HDL 46 11/20/2015   LDLCALC 86 11/20/2015   TRIG 143 11/20/2015      Wt Readings from Last 3 Encounters:  05/12/16 252 lb 4 oz (114.4 kg)  05/04/16 257 lb 6 oz (116.7 kg)  03/23/16 261 lb 8 oz (118.6 kg)       ASSESSMENT AND  PLAN:   Essential hypertension - Plan: EKG 12-Lead Blood pressure recently well controlled, low in the evening Recommended he move the bystolic to the evening as he is having palpitations in the morning  Premature beats - Plan: EKG 12-Lead We will move the beta blocker to the evening  Pure hypercholesterolemia - Plan: EKG 12-Lead Cholesterol is at goal on the current lipid regimen. No changes to the medications were made.  Typical atrial flutter (HCC) - Plan: EKG 12-Lead Maintaining normal sinus rhythm, changes as above  Coronary artery disease involving native coronary artery of native heart without angina pectoris - Plan: EKG 12-Lead Currently with no symptoms of angina. No further workup at this time. Continue current medication regimen.  Anxiety Doing better, Symptoms stable  Alcohol use Several per day Unclear if this is contribute to his tremor    Total encounter time more than 25 minutes  Greater than 50% was spent in counseling and coordination of care with the patient   Disposition:   F/U  6 months   Orders Placed This Encounter  Procedures  . EKG 12-Lead     Signed, Esmond Plants, M.D., Ph.D. 05/12/2016  [facility] [place], [place] [phone]

## 2016-05-12 ENCOUNTER — Telehealth: Payer: Self-pay | Admitting: Cardiovascular Disease

## 2016-05-12 ENCOUNTER — Ambulatory Visit (INDEPENDENT_AMBULATORY_CARE_PROVIDER_SITE_OTHER): Payer: 59 | Admitting: Cardiovascular Disease

## 2016-05-12 ENCOUNTER — Encounter: Payer: Self-pay | Admitting: Cardiovascular Disease

## 2016-05-12 VITALS — BP 164/90 | HR 68 | Ht 73.0 in | Wt 252.2 lb

## 2016-05-12 DIAGNOSIS — E7800 Pure hypercholesterolemia, unspecified: Secondary | ICD-10-CM | POA: Diagnosis not present

## 2016-05-12 DIAGNOSIS — I10 Essential (primary) hypertension: Secondary | ICD-10-CM

## 2016-05-12 DIAGNOSIS — F1011 Alcohol abuse, in remission: Secondary | ICD-10-CM

## 2016-05-12 DIAGNOSIS — I2510 Atherosclerotic heart disease of native coronary artery without angina pectoris: Secondary | ICD-10-CM

## 2016-05-12 DIAGNOSIS — Z87898 Personal history of other specified conditions: Secondary | ICD-10-CM

## 2016-05-12 DIAGNOSIS — I700 Atherosclerosis of aorta: Secondary | ICD-10-CM | POA: Diagnosis not present

## 2016-05-12 DIAGNOSIS — I483 Typical atrial flutter: Secondary | ICD-10-CM

## 2016-05-12 NOTE — Telephone Encounter (Signed)
Left message for pt to call back  °

## 2016-05-12 NOTE — Patient Instructions (Addendum)
Medication Instructions:   Stay on lisinopril whole pill in the AM  Take a whole bytolic at night  Allergy pill: cetirazine/zyrtec  Labwork:  BMP, LFTs, Lipids, HBA1C at [place]  Testing/Procedures:  No further testing at this time   I recommend watching educational videos on topics of interest to you at:       [email or web address]  Enter code: [email or web address]    Follow-Up: It was a pleasure seeing you in the office today. Please call us if you have new issues that need to be addressed before your next appt.  [phone]  Your physician wants you to follow-up in: 6 months.  You will receive a reminder letter in the mail two months in advance. If you don't receive a letter, please call our office to schedule the follow-up appointment.  If you need a refill on your cardiac medications before your next appointment, please call your pharmacy.

## 2016-05-12 NOTE — Telephone Encounter (Addendum)
Spoke w/ pt.  He states that he has the lab slips to take to [facility] and every page has the dx of "History of alcohol abuse". Pt states that he does not "sit around drinking all day" and he is offended that this is in his chart. He has an upcoming lawsuit regarding chol meds damaging his liver and he states that this will not look good for his case. Advised pt that our office did not enter the initial diagnosis, that it is present in his records back to 2014 w/ other MDs.   Pt asks that this be removed from his chart, b/c it is not true. Advised pt that the diagnosis reads "history", not "current", but he states that his lawsuit is in regards to a dx of fatty liver disease caused by Lipitor and that this could potentially hurt his case.  Advised pt that we cannot delete info from his chart, esp from other physicians. Pt is quite upset, repeats that he does not have a criminal record, he does drink daily, but he can stop anytime, and that this should not be in his chart. He asks that I at least send a message to our office manager to see if this can be taken out of his cardiac records.

## 2016-05-12 NOTE — Telephone Encounter (Signed)
Pt calling stating he was looking on his lab orders and on there it states for a reason to be done, "Alcohol abuse" he is wanting to know why that is there He states that is not true Would like a call back soon Please advise

## 2016-05-15 NOTE — Telephone Encounter (Signed)
I read from notes dating back a few years from other providers detailing heavy etoh intake It is not my position to delete another providers notes or diagnosis codes. Deleting this now does not change previous years of records

## 2016-05-16 ENCOUNTER — Telehealth: Payer: Self-pay | Admitting: Cardiovascular Disease

## 2016-05-16 ENCOUNTER — Encounter: Payer: Self-pay | Admitting: Emergency Medicine

## 2016-05-16 ENCOUNTER — Observation Stay
Admission: EM | Admit: 2016-05-16 | Discharge: 2016-05-17 | Disposition: A | Discharge status: Home / Self Care | Payer: 59 | Attending: Internal Medicine | Admitting: Internal Medicine

## 2016-05-16 DIAGNOSIS — Z8546 Personal history of malignant neoplasm of prostate: Secondary | ICD-10-CM | POA: Diagnosis not present

## 2016-05-16 DIAGNOSIS — M109 Gout, unspecified: Secondary | ICD-10-CM | POA: Diagnosis not present

## 2016-05-16 DIAGNOSIS — I4891 Unspecified atrial fibrillation: Secondary | ICD-10-CM | POA: Diagnosis not present

## 2016-05-16 DIAGNOSIS — K219 Gastro-esophageal reflux disease without esophagitis: Secondary | ICD-10-CM | POA: Diagnosis not present

## 2016-05-16 DIAGNOSIS — I482 Chronic atrial fibrillation: Secondary | ICD-10-CM | POA: Diagnosis not present

## 2016-05-16 DIAGNOSIS — Z6833 Body mass index (BMI) 33.0-33.9, adult: Secondary | ICD-10-CM | POA: Insufficient documentation

## 2016-05-16 DIAGNOSIS — E669 Obesity, unspecified: Secondary | ICD-10-CM | POA: Diagnosis not present

## 2016-05-16 DIAGNOSIS — K921 Melena: Principal | ICD-10-CM

## 2016-05-16 DIAGNOSIS — N400 Enlarged prostate without lower urinary tract symptoms: Secondary | ICD-10-CM | POA: Diagnosis not present

## 2016-05-16 DIAGNOSIS — E785 Hyperlipidemia, unspecified: Secondary | ICD-10-CM | POA: Diagnosis not present

## 2016-05-16 DIAGNOSIS — F17210 Nicotine dependence, cigarettes, uncomplicated: Secondary | ICD-10-CM | POA: Diagnosis not present

## 2016-05-16 DIAGNOSIS — D649 Anemia, unspecified: Secondary | ICD-10-CM

## 2016-05-16 DIAGNOSIS — J449 Chronic obstructive pulmonary disease, unspecified: Secondary | ICD-10-CM | POA: Diagnosis not present

## 2016-05-16 DIAGNOSIS — Z79899 Other long term (current) drug therapy: Secondary | ICD-10-CM | POA: Insufficient documentation

## 2016-05-16 DIAGNOSIS — K922 Gastrointestinal hemorrhage, unspecified: Secondary | ICD-10-CM | POA: Diagnosis not present

## 2016-05-16 DIAGNOSIS — Z7901 Long term (current) use of anticoagulants: Secondary | ICD-10-CM | POA: Insufficient documentation

## 2016-05-16 DIAGNOSIS — K449 Diaphragmatic hernia without obstruction or gangrene: Secondary | ICD-10-CM | POA: Diagnosis not present

## 2016-05-16 DIAGNOSIS — D696 Thrombocytopenia, unspecified: Secondary | ICD-10-CM | POA: Insufficient documentation

## 2016-05-16 DIAGNOSIS — I2510 Atherosclerotic heart disease of native coronary artery without angina pectoris: Secondary | ICD-10-CM | POA: Diagnosis not present

## 2016-05-16 DIAGNOSIS — G629 Polyneuropathy, unspecified: Secondary | ICD-10-CM | POA: Insufficient documentation

## 2016-05-16 DIAGNOSIS — I10 Essential (primary) hypertension: Secondary | ICD-10-CM | POA: Insufficient documentation

## 2016-05-16 DIAGNOSIS — F17200 Nicotine dependence, unspecified, uncomplicated: Secondary | ICD-10-CM | POA: Diagnosis not present

## 2016-05-16 LAB — PROTIME-INR
INR: 1.79
Prothrombin Time: 21 seconds — ABNORMAL HIGH (ref 11.4–15.2)

## 2016-05-16 LAB — COMPREHENSIVE METABOLIC PANEL
ALK PHOS: 78 U/L (ref 38–126)
ALT: 32 U/L (ref 17–63)
ANION GAP: 10 (ref 5–15)
AST: 72 U/L — ABNORMAL HIGH (ref 15–41)
Albumin: 3.4 g/dL — ABNORMAL LOW (ref 3.5–5.0)
BILIRUBIN TOTAL: 2.9 mg/dL — AB (ref 0.3–1.2)
BUN: 13 mg/dL (ref 6–20)
CALCIUM: 9 mg/dL (ref 8.9–10.3)
CO2: 23 mmol/L (ref 22–32)
CREATININE: 0.68 mg/dL (ref 0.61–1.24)
Chloride: 102 mmol/L (ref 101–111)
GFR calc non Af Amer: >60 mL/min (ref >60)
Glucose, Bld: 137 mg/dL — ABNORMAL HIGH (ref 65–99)
Potassium: 4.3 mmol/L (ref 3.5–5.1)
Sodium: 135 mmol/L (ref 135–145)
TOTAL PROTEIN: 7.1 g/dL (ref 6.5–8.1)

## 2016-05-16 LAB — TYPE AND SCREEN
ABO/RH(D): O POS
ANTIBODY SCREEN: NEGATIVE

## 2016-05-16 LAB — CBC
HCT: 35.2 % — ABNORMAL LOW (ref 40.0–52.0)
HEMOGLOBIN: 12.3 g/dL — AB (ref 13.0–18.0)
MCH: 37.9 pg — ABNORMAL HIGH (ref 26.0–34.0)
MCHC: 35 g/dL (ref 32.0–36.0)
MCV: 108.4 fL — ABNORMAL HIGH (ref 80.0–100.0)
PLATELETS: 88 10*3/uL — AB (ref 150–440)
RBC: 3.25 MIL/uL — AB (ref 4.40–5.90)
RDW: 14.3 % (ref 11.5–14.5)
WBC: 11.3 10*3/uL — ABNORMAL HIGH (ref 3.8–10.6)

## 2016-05-16 LAB — HEMOGLOBIN: Hemoglobin: 12.6 g/dL — ABNORMAL LOW (ref 13.0–18.0)

## 2016-05-16 MED ORDER — TAMSULOSIN HCL 0.4 MG PO CAPS
0.4000 mg | ORAL_CAPSULE | Freq: Every day | ORAL | Status: DC
  Filled 2016-05-16: qty 1

## 2016-05-16 MED ORDER — COLCHICINE 0.6 MG PO TABS: 0.6000 mg | ORAL_TABLET | ORAL | Status: DC | PRN

## 2016-05-16 MED ORDER — TRAMADOL HCL 50 MG PO TABS: 50.0000 mg | ORAL_TABLET | Freq: Four times a day (QID) | ORAL | Status: DC | PRN

## 2016-05-16 MED ORDER — ONDANSETRON HCL 4 MG PO TABS: 4.0000 mg | ORAL_TABLET | Freq: Four times a day (QID) | ORAL | Status: DC | PRN

## 2016-05-16 MED ORDER — ACETAMINOPHEN 650 MG RE SUPP: 650.0000 mg | Freq: Four times a day (QID) | RECTAL | Status: DC | PRN

## 2016-05-16 MED ORDER — ACETAMINOPHEN 325 MG PO TABS: 650.0000 mg | ORAL_TABLET | Freq: Four times a day (QID) | ORAL | Status: DC | PRN

## 2016-05-16 MED ORDER — GABAPENTIN 100 MG PO CAPS
200.0000 mg | ORAL_CAPSULE | Freq: Two times a day (BID) | ORAL | Status: DC
  Administered 2016-05-16 – 2016-05-17 (×2): 200 mg via ORAL
  Filled 2016-05-16 (×2): qty 2

## 2016-05-16 MED ORDER — ONDANSETRON HCL 4 MG/2ML IJ SOLN: 4.0000 mg | Freq: Four times a day (QID) | INTRAMUSCULAR | Status: DC | PRN

## 2016-05-16 MED ORDER — HYDROXYZINE HCL 25 MG PO TABS
50.0000 mg | ORAL_TABLET | Freq: Every day | ORAL | Status: DC
  Administered 2016-05-16: 50 mg via ORAL
  Filled 2016-05-16: qty 2

## 2016-05-16 MED ORDER — PANTOPRAZOLE SODIUM 40 MG IV SOLR
40.0000 mg | Freq: Two times a day (BID) | INTRAVENOUS | Status: DC
  Administered 2016-05-16: 40 mg via INTRAVENOUS
  Filled 2016-05-16: qty 40

## 2016-05-16 MED ORDER — CLONIDINE HCL 0.1 MG PO TABS
0.1000 mg | ORAL_TABLET | Freq: Two times a day (BID) | ORAL | Status: DC
Start: 2016-05-16 — End: 2016-05-17
  Administered 2016-05-16 – 2016-05-17 (×2): 0.1 mg via ORAL
  Filled 2016-05-16 (×2): qty 1

## 2016-05-16 MED ORDER — ADULT MULTIVITAMIN W/MINERALS CH
1.0000 | ORAL_TABLET | Freq: Every day | ORAL | Status: DC
  Administered 2016-05-17: 1 via ORAL
  Filled 2016-05-16 (×2): qty 1

## 2016-05-16 MED ORDER — ALPRAZOLAM 0.5 MG PO TABS
0.5000 mg | ORAL_TABLET | Freq: Once | ORAL | Status: AC
  Administered 2016-05-16: 0.5 mg via ORAL
  Filled 2016-05-16: qty 1

## 2016-05-16 MED ORDER — NEBIVOLOL HCL 5 MG PO TABS
20.0000 mg | ORAL_TABLET | Freq: Every day | ORAL | Status: DC
  Administered 2016-05-17: 20 mg via ORAL
  Filled 2016-05-16: qty 4

## 2016-05-16 MED ORDER — LISINOPRIL 20 MG PO TABS
40.0000 mg | ORAL_TABLET | Freq: Every day | ORAL | Status: DC
  Administered 2016-05-17: 40 mg via ORAL
  Filled 2016-05-16: qty 2

## 2016-05-16 NOTE — Telephone Encounter (Signed)
Left message for pt to call back  °

## 2016-05-16 NOTE — Telephone Encounter (Signed)
Left message for pt to call back. Recommended he proceed to the ED if he is still having sx.

## 2016-05-16 NOTE — Telephone Encounter (Signed)
Patient returning call.

## 2016-05-16 NOTE — Telephone Encounter (Signed)
Pt currently in [facility] ED. 

## 2016-05-16 NOTE — Telephone Encounter (Signed)
Pt stating the past three days he's been having some tar like stool  He's read up on the medication he's on and he thinks it may be his Eliquis Takes it twice a day one in the morning and one in the evening States he's also been on this a year and has never had this issue Would like some advise on this Please call back

## 2016-05-16 NOTE — Telephone Encounter (Signed)
Spoke w/ pt.   He reports black tarry stool x 3 days.   He denies change in his diet or BRBPR. Advised him to proceed to the ED. Pt states that he does not want to go to the ED b/c he feels fine and would like to know if there is another cause. Discussed w/ him that he may see his PCP or go to urgent care, but there is no way to know the cause of his black tarry stools w/o workup. He asks if this is my medical opinion.  Advised him that I spoke w/ 2 other nurses in the office and we all agree that pt should proceed to the ED. He will consider his options and call back if we can be of further assistance.

## 2016-05-16 NOTE — H&P (Signed)
[place] [facility] [place] NAME: Peter [name]    MR#:  [id number]  DATE OF BIRTH:  1955/03/08  DATE OF ADMISSION:  05/16/2016  PRIMARY CARE PHYSICIAN: Coral Spikes, DO   REQUESTING/REFERRING PHYSICIAN: Dr. Mariea Clonts  CHIEF COMPLAINT:   Chief Complaint  Patient presents with  . Melena    HISTORY OF PRESENT ILLNESS:  Peter [name]  is a 61 y.o. male with a known history of chronic atrial fibrillation, hypertension, hyperlipidemia, history of prostate cancer, anxiety, osteoarthritis, history of alcohol abuse who presents to the hospital due to melanotic stools. Patient says that he is on anticoagulation for his atrial fibrillation and he looked up the side effects and one of them was black stools which she has been having for the past 4 days. He called his cardiologist Dr. Rockey Situ who recommended he come to the ER for further evaluation. Patient denies any chest pains, shortness of breath, weakness, dizziness, abdominal pain or any other associated symptoms. He does have a history of an esophageal stricture which was dilated many years ago. Given his ongoing melanotic stools hospitalist services were contacted for further treatment and evaluation.  PAST MEDICAL HISTORY:   Past Medical History:  Diagnosis Date  . Abnormality of gait 09/03/2012  . Alcohol abuse   . Anxiety   . Arthritis   . Arthritis   . Esophageal stricture   . GERD (gastroesophageal reflux disease)   . Gout   . Hyperlipidemia   . Hypertension   . Internal hemorrhoids   . Lesion of ulnar nerve 09/03/2012   Right ulnar neuropathy  . Murmur, cardiac   . Palpitations   . Prostate cancer ([place]) 2016   treated with radioactive seed implant  . Seizures ([place])    pt states he had seizure 12/14  . Tubular adenoma of colon 03/2008    PAST SURGICAL HISTORY:   Past Surgical History:  Procedure Laterality Date  . CATARACT EXTRACTION W/PHACO Right 07/03/2014   Procedure: CATARACT  EXTRACTION PHACO AND INTRAOCULAR LENS PLACEMENT (IOC);  Surgeon: Lyla Glassing, MD;  Location: [facility] ORS;  Service: Ophthalmology;  Laterality: Right;  lot # [id number] H [place]: 00:32.3 AP% 9.1 CDE: 2.92  . CIRCUMCISION    . COLONOSCOPY    . ELBOW SURGERY    . ELECTROPHYSIOLOGIC STUDY N/A 11/02/2015   Procedure: CARDIOVERSION;  Surgeon: Minna Merritts, MD;  Location: [facility] ORS;  Service: Cardiovascular;  Laterality: N/A;  . EYE SURGERY    . HAND SURGERY Left   . SHOULDER SURGERY      SOCIAL HISTORY:   Social History  Substance Use Topics  . Smoking status: Current Every Day Smoker    Years: 24.00    Types: Cigarettes  . Smokeless tobacco: Never Used     Comment: down to 2 ciggs a day.  . Alcohol use 8.4 oz/week    14 Cans of beer per week     Comment: 4 beers per night    FAMILY HISTORY:   Family History  Problem Relation Age of Onset  . Diabetes Father   . Kidney disease Father   . Lung cancer Father     smoker  . Hyperlipidemia Father   . Hypertension Father   . Lung cancer Mother     smoke  . Hypertension Mother   . Colon cancer Neg Hx   . Pancreatic cancer Neg Hx   . Stomach cancer Neg Hx     DRUG ALLERGIES:  Allergies  Allergen Reactions  . Crestor [Rosuvastatin] Other (See Comments)    Bloating, swelling of legs, fatty liver  . Hydrocodone Bitartrate Er   . Lipitor [Atorvastatin] Other (See Comments)    Myalgias  . Topamax [Topiramate]     Cognitive clouding    REVIEW OF SYSTEMS:   Review of Systems  Constitutional: Negative for fever and weight loss.  HENT: Negative for congestion, nosebleeds and tinnitus.   Eyes: Negative for blurred vision, double vision and redness.  Respiratory: Negative for cough, hemoptysis and shortness of breath.   Cardiovascular: Negative for chest pain, orthopnea, leg swelling and PND.  Gastrointestinal: Positive for melena. Negative for abdominal pain, diarrhea, nausea and vomiting.  Genitourinary: Negative for  dysuria, hematuria and urgency.  Musculoskeletal: Negative for falls and joint pain.  Neurological: Negative for dizziness, tingling, sensory change, focal weakness, seizures, weakness and headaches.  Endo/Heme/Allergies: Negative for polydipsia. Does not bruise/bleed easily.  Psychiatric/Behavioral: Negative for depression and memory loss. The patient is not nervous/anxious.     MEDICATIONS AT HOME:   Prior to Admission medications   Medication Sig Start Date End Date Taking? Authorizing Provider  Alirocumab (PRALUENT) 150 MG/ML SOPN Inject 1 pen into the skin every 14 (fourteen) days. 12/07/15  Yes Gollan, Kathlene November, MD  apixaban (ELIQUIS) 5 MG TABS tablet Take 1 tablet (5 mg total) by mouth 2 (two) times daily. 10/08/15  Yes Minna Merritts, MD  cloNIDine (CATAPRES) 0.1 MG tablet Take 1 tablet (0.1 mg total) by mouth 2 (two) times daily. 04/19/16  Yes Minna Merritts, MD  colchicine 0.6 MG tablet Take 0.6 mg by mouth as needed (gout). For gout flare ups   Yes [provider]  gabapentin (NEURONTIN) 100 MG capsule Take 1 capsule (100 mg total) by mouth 3 (three) times daily. Patient taking differently: Take 200 mg by mouth 2 (two) times daily.  04/26/16  Yes Kathrynn Ducking, MD  hydrOXYzine (ATARAX/VISTARIL) 50 MG tablet Take 50 mg by mouth at bedtime. Reported on 02/04/2015   Yes [provider]  lisinopril (PRINIVIL,ZESTRIL) 40 MG tablet TAKE 1 TABLET (40 MG TOTAL) BY MOUTH DAILY. 03/25/16  Yes Gollan, Kathlene November, MD  milk thistle 175 MG tablet Take 350 mg by mouth daily.    Yes [provider]  Multiple Vitamin (MULTIVITAMIN) tablet Take 1 tablet by mouth daily. Centrum Plus -Take 1 daily   Yes [provider]  Nebivolol HCl (BYSTOLIC) 20 MG TABS Take 1 tablet (20 mg total) by mouth daily. 12/16/15  Yes Gollan, Kathlene November, MD  tamsulosin (FLOMAX) 0.4 MG CAPS capsule Take 0.4 mg by mouth daily.    Yes [provider]  traMADol (ULTRAM) 50 MG  tablet TAKE 1 TABLET BY MOUTH EVERY 6 TO 8 HOURS AS NEEDED FOR PAIN. TAKE WITH EXTRA STRENGTH TYLENOL 04/18/16  Yes Magnus Sinning, MD  triamcinolone cream (KENALOG) 0.1 % Apply 1 application topically daily as needed.  12/24/14  Yes [provider]  VOLTAREN 1 % GEL Apply 2 g topically as needed (knees).  04/02/13  Yes [provider]  cyclobenzaprine (FLEXERIL) 10 MG tablet Take 1 tablet (10 mg total) by mouth 3 (three) times daily as needed for muscle spasms. Patient not taking: Reported on 05/16/2016 11/22/15   Hagler, Jami L, PA-C  varenicline (CHANTIX CONTINUING MONTH PAK) 1 MG tablet Take 1 tablet (1 mg total) by mouth 2 (two) times daily. Patient not taking: Reported on 05/16/2016 06/15/15   Minna Merritts,  MD      VITAL SIGNS:  Blood pressure (!) 158/80, pulse 65, temperature 98.3 F (36.8 C), temperature source Oral, resp. rate 16, height 6\' 1"  (1.854 m), weight 114.3 kg (252 lb), SpO2 97 %.  PHYSICAL EXAMINATION:  Physical Exam  GENERAL:  61 y.o.-year-old patient lying in bed with no acute distress.  EYES: Pupils equal, round, reactive to light and accommodation. No scleral icterus. Extraocular muscles intact.  HEENT: Head atraumatic, normocephalic. Oropharynx and nasopharynx clear. No oropharyngeal erythema, moist oral mucosa  NECK:  Supple, no jugular venous distention. No thyroid enlargement, no tenderness.  LUNGS: Normal breath sounds bilaterally, no wheezing, rales, rhonchi. No use of accessory muscles of respiration.  CARDIOVASCULAR: S1, S2 RRR. No murmurs, rubs, gallops, clicks.  ABDOMEN: Soft, nontender, nondistended. Bowel sounds present. No organomegaly or mass.  EXTREMITIES: No pedal edema, cyanosis, or clubbing. + 2 pedal & radial pulses b/l.   NEUROLOGIC: Cranial nerves II through XII are intact. No focal Motor or sensory deficits appreciated b/l PSYCHIATRIC: The patient is alert and oriented x 3.  SKIN: No obvious rash, lesion, or ulcer.    LABORATORY PANEL:   CBC  Recent Labs Lab 05/16/16 1439  WBC 11.3*  HGB 12.3*  HCT 35.2*  PLT 88*   ------------------------------------------------------------------------------------------------------------------  Chemistries   Recent Labs Lab 05/16/16 1439  NA 135  K 4.3  CL 102  CO2 23  GLUCOSE 137*  BUN 13  CREATININE 0.68  CALCIUM 9.0  AST 72*  ALT 32  ALKPHOS 78  BILITOT 2.9*   ------------------------------------------------------------------------------------------------------------------  Cardiac Enzymes No results for input(s): TROPONINI in the last 168 hours. ------------------------------------------------------------------------------------------------------------------  RADIOLOGY:  No results found.   IMPRESSION AND PLAN:   61 year old male with past medical history of chronic atrial fibrillation,COPD, history of prostate cancer, hypertension, hyperlipidemia, GERD with history of esophageal stricture, alcohol abuse, osteoarthritis who presents to the hospital due to melanotic stools.  1. Upper GI bleed-suspected diagnosis given patient's melanotic stools. - pt. Is on Eliquis which I will hold for now. Follow serial hemoglobins which are currently stable. - This on clear liquid diet, get a gastroegy consult. Place on IV Protonix.  2. Hx of chronic a. Fib - currently rate controlled.  - cont. Bystolic.  Hold eliquis.   3. Essential hypertension-continue clonidine, lisinopril Bystolic.  4.history of gout-no acute attack. Continue colchicine.  5. History of neuropathy-continue gabapentin.  6. BPH-continue Flomax.    All the records are reviewed and case discussed with ED provider. Management plans discussed with the patient, family and they are in agreement.  CODE STATUS: Full code  TOTAL TIME TAKING CARE OF THIS PATIENT: 40 minutes.    Henreitta Leber M.D on 05/16/2016 at 8:42 PM  Between 7am to 6pm - Pager - [phone]  After  6pm go to [email or web address] - password [email or web address] [facility]  [facility]  Office  [phone]  CC: Primary care physician; Coral Spikes, DO

## 2016-05-16 NOTE — ED Notes (Signed)
Pt reports that he went to cardiologist and was placed on blood thinners - pt states over the last 4 days his stools have been dark in color - pt called cardiologist and was told to come to the er for eval for GI bleed - pt denies any other symptoms and states he does not feel bad

## 2016-05-16 NOTE — ED Notes (Signed)
Pt up to bathroom with no problems. 

## 2016-05-16 NOTE — ED Triage Notes (Signed)
Pt on eliquis for afib, reports black tarry stools x4 days. Talked to Dr Donivan Scull office, was told to come here.

## 2016-05-16 NOTE — ED Provider Notes (Signed)
[facility] Emergency Department Provider Note  ____________________________________________  Time seen: Approximately 7:31 PM  I have reviewed the triage vital signs and the nursing notes.   HISTORY  Chief Complaint Melena    HPI AMEET SANDY is a 61 y.o. male with a history ofatrial flutter on Eliquis, GERD, internal hemorrhoids, and alcohol abuse presenting for 4 days of melena. The patient was started on Eliquis approximately one year ago. He takes a vitamin supplement, which has iron in it but this is not new. He does not take Pepto-Bismol. He has not had any epigastric discomfort, diarrhea, or fever. No lightheadedness, shortness of breath, chest pain or palpitations, or syncope.   Past Medical History:  Diagnosis Date  . Abnormality of gait 09/03/2012  . Alcohol abuse   . Anxiety   . Arthritis   . Arthritis   . Esophageal stricture   . GERD (gastroesophageal reflux disease)   . Gout   . Hyperlipidemia   . Hypertension   . Internal hemorrhoids   . Lesion of ulnar nerve 09/03/2012   Right ulnar neuropathy  . Murmur, cardiac   . Palpitations   . Prostate cancer ([place]) 2016   treated with radioactive seed implant  . Seizures ([place])    pt states he had seizure 12/14  . Tubular adenoma of colon 03/2008    Patient Active Problem List   Diagnosis Date Noted  . Bruise 05/04/2016  . Tremor 03/23/2016  . Paresthesia 03/23/2016  . Anxiety 02/23/2016  . Encounter for removal of sutures 01/19/2016  . Typical atrial flutter ([place]) 09/21/2015  . CAD (coronary artery disease) 05/06/2015  . Aortic atherosclerosis ([place]) 03/20/2015  . History of alcohol abuse 02/04/2015  . Alcohol induced fatty liver 02/04/2015  . Obesity (BMI 30.0-34.9) 02/04/2015  . Back pain 02/04/2015  . History of prostate cancer 02/04/2015  . Chronic venous insufficiency 10/12/2012  . Lesion of ulnar nerve 09/03/2012  . Tobacco abuse 04/05/2011  . Hyperlipidemia 01/21/2010   . Essential hypertension 12/16/2009  . Premature beats 12/16/2009    Past Surgical History:  Procedure Laterality Date  . CATARACT EXTRACTION W/PHACO Right 07/03/2014   Procedure: CATARACT EXTRACTION PHACO AND INTRAOCULAR LENS PLACEMENT (IOC);  Surgeon: Lyla Glassing, MD;  Location: [facility] ORS;  Service: Ophthalmology;  Laterality: Right;  lot # [id number] H [place]: 00:32.3 AP% 9.1 CDE: 2.92  . CIRCUMCISION    . COLONOSCOPY    . ELBOW SURGERY    . ELECTROPHYSIOLOGIC STUDY N/A 11/02/2015   Procedure: CARDIOVERSION;  Surgeon: Minna Merritts, MD;  Location: [facility] ORS;  Service: Cardiovascular;  Laterality: N/A;  . EYE SURGERY    . HAND SURGERY Left   . SHOULDER SURGERY      Current Outpatient Rx  . Order #: [id number] Class: Normal  . Order #: [id number] Class: Normal  . Order #: [id number] Class: Normal  . Order #: [id number] Class: Historical Med  . Order #: [id number] Class: Print  . Order #: [id number] Class: Normal  . Order #: [id number] Class: Historical Med  . Order #: [id number] Class: Normal  . Order #: [id number] Class: Historical Med  . Order #: [id number] Class: Historical Med  . Order #: [id number] Class: Normal  . Order #: [id number] Class: Historical Med  . Order #: [id number] Class: Print  . Order #: [id number] Class: Historical Med  . Order #: [id number] Class: Normal  . Order #: [id number] Class: Historical Med    Allergies Crestor [rosuvastatin]; Hydrocodone bitartrate er; Lipitor [atorvastatin]; and Topamax [topiramate]  Family History  Problem Relation Age of Onset  . Diabetes  Father   . Kidney disease Father   . Lung cancer Father     smoker  . Hyperlipidemia Father   . Hypertension Father   . Lung cancer Mother     smoke  . Hypertension Mother   . Colon cancer Neg Hx   . Pancreatic cancer Neg Hx   . Stomach cancer Neg Hx     Social History Social History  Substance Use Topics  . Smoking status: Current Every Day Smoker    Years: 24.00    Types: Cigarettes  . Smokeless  tobacco: Never Used     Comment: down to 2 ciggs a day.  . Alcohol use 8.4 oz/week    14 Cans of beer per week     Comment: 4 beers per night    Review of Systems Constitutional: No fever/chills. No lightheadedness or syncope. Eyes: No visual changes. ENT: No congestion or rhinorrhea. Cardiovascular: Denies chest pain. Denies palpitations. Respiratory: Denies shortness of breath.  No cough. Gastrointestinal: No abdominal pain.  No nausea, no vomiting.  No diarrhea.  No constipation. Positive black stool. Genitourinary: Negative for dysuria. Musculoskeletal: Negative for back pain. Skin: Negative for rash. Neurological: Negative for headaches. No focal numbness, tingling or weakness.   10-point ROS otherwise negative.  ____________________________________________   PHYSICAL EXAM:  VITAL SIGNS: ED Triage Vitals  Enc Vitals Group     BP 05/16/16 1437 (!) 158/80     Pulse Rate 05/16/16 1437 65     Resp 05/16/16 1437 16     Temp 05/16/16 1437 98.3 F (36.8 C)     Temp Source 05/16/16 1437 Oral     SpO2 05/16/16 1437 97 %     Weight 05/16/16 1438 252 lb (114.3 kg)     Height 05/16/16 1438 6\' 1"  (1.854 m)     Head Circumference --      Peak Flow --      Pain Score 05/16/16 1437 0     Pain Loc --      Pain Edu? --      Excl. in [place]? --     Constitutional: Alert and oriented. Well appearing and in no acute distress. Answers questions appropriately. Eyes: Conjunctivae are normal.  EOMI. No scleral icterus. Head: Atraumatic. Nose: No congestion/rhinnorhea. Mouth/Throat: Mucous membranes are moist.  Neck: No stridor.  Supple.  No JVD. Cardiovascular: Normal rate, regular rhythm. No murmurs, rubs or gallops.  Respiratory: Normal respiratory effort.  No accessory muscle use or retractions. Lungs CTAB.  No wheezes, rales or ronchi. Gastrointestinal: Obese. Soft, nontender and nondistended.  No guarding or rebound.  No peritoneal signs. Genitourinary: Multiple nonthrombosed  nonbleeding extra hemorrhoids that are nontender. No palpable internal hemorrhoids. Stool is brown and black and guaiac-negative. Musculoskeletal: No LE edema. Neurologic:  A&Ox3.  Speech is clear.  Face and smile are symmetric.  EOMI.  Moves all extremities well. Skin:  Skin is warm, dry and intact. No rash noted. Psychiatric: Mood and affect are normal. Speech and behavior are normal.  Normal judgement.  ____________________________________________   LABS (all labs ordered are listed, but only abnormal results are displayed)  Labs Reviewed  COMPREHENSIVE METABOLIC PANEL - Abnormal; Notable for the following:       Result Value   Glucose, Bld 137 (*)    Albumin 3.4 (*)    AST 72 (*)    Total Bilirubin 2.9 (*)    All other components within normal limits  CBC - Abnormal; Notable for the following:  WBC 11.3 (*)    RBC 3.25 (*)    Hemoglobin 12.3 (*)    HCT 35.2 (*)    MCV 108.4 (*)    MCH 37.9 (*)    Platelets 88 (*)    All other components within normal limits  PROTIME-INR - Abnormal; Notable for the following:    Prothrombin Time 21.0 (*)    All other components within normal limits  POC OCCULT BLOOD, ED  TYPE AND SCREEN   ____________________________________________  EKG  ED ECG REPORT I, Eula Listen, the attending physician, personally viewed and interpreted this ECG.   Date: 05/16/2016  EKG Time: 2001  Rate: 68  Rhythm: normal sinus rhythm  Axis: leftward  Intervals:first-degree A-V block   ST&T Change:  Nonspecific T-wave inversions in V1. No STEMI ____________________________________________  RADIOLOGY  No results found.  ____________________________________________   PROCEDURES  Procedure(s) performed: None  Procedures  Critical Care performed: No ____________________________________________   INITIAL IMPRESSION / ASSESSMENT AND PLAN / ED COURSE  Pertinent labs & imaging results that were available during my care of the patient  were reviewed by me and considered in my medical decision making (see chart for details).  61 y.o. male on Eliquis for a flutter, presenting with 4 days of melena, guaiac positive here. Overall, the patient is hemodynamically stable. He does have a minimal drop in his hemoglobin from 13.9-12 today. He does not have any signs or symptoms of significant upper GI bleed, or significant anemia that is symptomatic. No Protonix or transfusion are indicated at this time. I've spoken with Dr. Roselyn Reef, the gastrointestinal specialist on-call, who will perform EGD in this patient tomorrow. Plan admission to the hospital at this time  ____________________________________________  FINAL CLINICAL IMPRESSION(S) / ED DIAGNOSES  Final diagnoses:  Melena  Gastrointestinal hemorrhage, unspecified gastrointestinal hemorrhage type  Anemia, unspecified type         NEW MEDICATIONS STARTED DURING THIS VISIT:  New Prescriptions   No medications on file      Eula Listen, MD 05/16/16 2040

## 2016-05-17 ENCOUNTER — Observation Stay: Payer: 59 | Admitting: Anesthesiology

## 2016-05-17 ENCOUNTER — Encounter
Admission: EM | Disposition: A | Discharge status: Home / Self Care | Payer: Self-pay | Source: Home / Self Care | Attending: Emergency Medicine

## 2016-05-17 ENCOUNTER — Telehealth: Payer: Self-pay | Admitting: Family Medicine

## 2016-05-17 ENCOUNTER — Telehealth: Payer: Self-pay | Admitting: Cardiovascular Disease

## 2016-05-17 ENCOUNTER — Encounter: Payer: Self-pay | Admitting: Anesthesiology

## 2016-05-17 DIAGNOSIS — K922 Gastrointestinal hemorrhage, unspecified: Secondary | ICD-10-CM | POA: Diagnosis not present

## 2016-05-17 DIAGNOSIS — I2510 Atherosclerotic heart disease of native coronary artery without angina pectoris: Secondary | ICD-10-CM | POA: Diagnosis not present

## 2016-05-17 DIAGNOSIS — K449 Diaphragmatic hernia without obstruction or gangrene: Secondary | ICD-10-CM | POA: Diagnosis not present

## 2016-05-17 DIAGNOSIS — I10 Essential (primary) hypertension: Secondary | ICD-10-CM | POA: Diagnosis not present

## 2016-05-17 DIAGNOSIS — K921 Melena: Secondary | ICD-10-CM | POA: Diagnosis not present

## 2016-05-17 DIAGNOSIS — I4891 Unspecified atrial fibrillation: Secondary | ICD-10-CM | POA: Diagnosis not present

## 2016-05-17 HISTORY — PX: ESOPHAGOGASTRODUODENOSCOPY (EGD) WITH PROPOFOL: SHX5813

## 2016-05-17 LAB — BASIC METABOLIC PANEL
ANION GAP: 7 (ref 5–15)
BUN: 11 mg/dL (ref 6–20)
CO2: 26 mmol/L (ref 22–32)
Calcium: 9.1 mg/dL (ref 8.9–10.3)
Chloride: 105 mmol/L (ref 101–111)
Creatinine, Ser: 0.61 mg/dL (ref 0.61–1.24)
Glucose, Bld: 115 mg/dL — ABNORMAL HIGH (ref 65–99)
POTASSIUM: 4.2 mmol/L (ref 3.5–5.1)
SODIUM: 138 mmol/L (ref 135–145)

## 2016-05-17 LAB — CBC
HCT: 35.7 % — ABNORMAL LOW (ref 40.0–52.0)
Hemoglobin: 12.6 g/dL — ABNORMAL LOW (ref 13.0–18.0)
MCH: 39 pg — ABNORMAL HIGH (ref 26.0–34.0)
MCHC: 35.3 g/dL (ref 32.0–36.0)
MCV: 110.4 fL — ABNORMAL HIGH (ref 80.0–100.0)
Platelets: 86 10*3/uL — ABNORMAL LOW (ref 150–440)
RBC: 3.23 MIL/uL — AB (ref 4.40–5.90)
RDW: 14.5 % (ref 11.5–14.5)
WBC: 8.3 10*3/uL (ref 3.8–10.6)

## 2016-05-17 SURGERY — ESOPHAGOGASTRODUODENOSCOPY (EGD) WITH PROPOFOL
Anesthesia: General

## 2016-05-17 MED ORDER — SODIUM CHLORIDE 0.9 % IV SOLN
INTRAVENOUS | Status: DC
  Administered 2016-05-17: 10:00:00 via INTRAVENOUS

## 2016-05-17 MED ORDER — LIDOCAINE HCL 2 % IJ SOLN
INTRAMUSCULAR | Status: AC
  Filled 2016-05-17: qty 10

## 2016-05-17 MED ORDER — PROPOFOL 500 MG/50ML IV EMUL
INTRAVENOUS | Status: AC
  Filled 2016-05-17: qty 50

## 2016-05-17 MED ORDER — PROPOFOL 500 MG/50ML IV EMUL
INTRAVENOUS | Status: DC | PRN
  Administered 2016-05-17: 160 ug/kg/min via INTRAVENOUS

## 2016-05-17 MED ORDER — PROPOFOL 10 MG/ML IV BOLUS
INTRAVENOUS | Status: DC | PRN
  Administered 2016-05-17: 50 mg via INTRAVENOUS
  Administered 2016-05-17: 100 mg via INTRAVENOUS

## 2016-05-17 MED ORDER — LIDOCAINE HCL (CARDIAC) 20 MG/ML IV SOLN
INTRAVENOUS | Status: DC | PRN
  Administered 2016-05-17: 100 mg via INTRAVENOUS

## 2016-05-17 MED ORDER — LORAZEPAM 2 MG/ML IJ SOLN
0.5000 mg | Freq: Once | INTRAMUSCULAR | Status: AC
  Administered 2016-05-17: 0.5 mg via INTRAVENOUS
  Filled 2016-05-17: qty 1

## 2016-05-17 NOTE — Consult Note (Signed)
Lucilla Lame, MD [facility]  [address], [place], [place] [address] Phone: [phone] Fax : [phone]  Consultation  Referring Provider:     Jaclyn Shaggy Primary Care Physician:  Coral Spikes, DO Primary Gastroenterologist:  Dr. Fuller Plan         Reason for Consultation:     Melena  Date of Admission:  05/16/2016 Date of Consultation:  05/17/2016         HPI:   Peter [name] is a 61 y.o. male was a history of atrial fibrillation and is presently on anticoagulation. The patient reports that he takes his antigliadin relation for atrial fibrillation. The patient started having black stools 4 days ago and contacted his cardiologist who recommended he go to the emergency room. In the emergency room the patient was found to have a hemoglobin of 12.3 with a repeat hemoglobin this morning of 12.6. The patient's baseline a year ago was 14.7. The patient denies any abdominal pain fevers chills nausea or vomiting. The patient also denies taking any anti-inflammatory medication including aspirin. There is no report of any unexplained weight. The patient reports that he has had an upper endoscopy in the past for dysphagia and has had 2 colonoscopies in the past. The patient denies any dizziness shortness of breath or chest pains associated with his melena.  Past Medical History:  Diagnosis Date  . Abnormality of gait 09/03/2012  . Alcohol abuse   . Anxiety   . Arthritis   . Arthritis   . Esophageal stricture   . GERD (gastroesophageal reflux disease)   . Gout   . Hyperlipidemia   . Hypertension   . Internal hemorrhoids   . Lesion of ulnar nerve 09/03/2012   Right ulnar neuropathy  . Murmur, cardiac   . Palpitations   . Prostate cancer ([place]) 2016   treated with radioactive seed implant  . Seizures ([place])    pt states he had seizure 12/14  . Tubular adenoma of colon 03/2008    Past Surgical History:  Procedure Laterality Date  . CATARACT EXTRACTION W/PHACO Right 07/03/2014   Procedure: CATARACT EXTRACTION PHACO AND INTRAOCULAR LENS PLACEMENT (IOC);  Surgeon: Lyla Glassing, MD;  Location: [facility] ORS;  Service: Ophthalmology;  Laterality: Right;  lot # [id number] H [place]: 00:32.3 AP% 9.1 CDE: 2.92  . CIRCUMCISION    . COLONOSCOPY    . ELBOW SURGERY    . ELECTROPHYSIOLOGIC STUDY N/A 11/02/2015   Procedure: CARDIOVERSION;  Surgeon: Minna Merritts, MD;  Location: [facility] ORS;  Service: Cardiovascular;  Laterality: N/A;  . EYE SURGERY    . HAND SURGERY Left   . SHOULDER SURGERY      Prior to Admission medications   Medication Sig Start Date End Date Taking? Authorizing Provider  Alirocumab (PRALUENT) 150 MG/ML SOPN Inject 1 pen into the skin every 14 (fourteen) days. 12/07/15  Yes Gollan, Kathlene November, MD  apixaban (ELIQUIS) 5 MG TABS tablet Take 1 tablet (5 mg total) by mouth 2 (two) times daily. 10/08/15  Yes Minna Merritts, MD  cloNIDine (CATAPRES) 0.1 MG tablet Take 1 tablet (0.1 mg total) by mouth 2 (two) times daily. 04/19/16  Yes Minna Merritts, MD  colchicine 0.6 MG tablet Take 0.6 mg by mouth as needed (gout). For gout flare ups   Yes [provider]  gabapentin (NEURONTIN) 100 MG capsule Take 1 capsule (100 mg total) by mouth 3 (three) times daily. Patient taking differently: Take 200 mg by mouth 2 (two)  times daily.  04/26/16  Yes Kathrynn Ducking, MD  hydrOXYzine (ATARAX/VISTARIL) 50 MG tablet Take 50 mg by mouth at bedtime. Reported on 02/04/2015   Yes [provider]  lisinopril (PRINIVIL,ZESTRIL) 40 MG tablet TAKE 1 TABLET (40 MG TOTAL) BY MOUTH DAILY. 03/25/16  Yes Gollan, Kathlene November, MD  milk thistle 175 MG tablet Take 350 mg by mouth daily.    Yes [provider]  Multiple Vitamin (MULTIVITAMIN) tablet Take 1 tablet by mouth daily. Centrum Plus -Take 1 daily   Yes [provider]  Nebivolol HCl (BYSTOLIC) 20 MG TABS Take 1 tablet (20 mg total) by mouth daily. 12/16/15  Yes Gollan, Kathlene November, MD  tamsulosin (FLOMAX) 0.4 MG  CAPS capsule Take 0.4 mg by mouth daily.    Yes [provider]  traMADol (ULTRAM) 50 MG tablet TAKE 1 TABLET BY MOUTH EVERY 6 TO 8 HOURS AS NEEDED FOR PAIN. TAKE WITH EXTRA STRENGTH TYLENOL 04/18/16  Yes Magnus Sinning, MD  triamcinolone cream (KENALOG) 0.1 % Apply 1 application topically daily as needed.  12/24/14  Yes [provider]  VOLTAREN 1 % GEL Apply 2 g topically as needed (knees).  04/02/13  Yes [provider]  cyclobenzaprine (FLEXERIL) 10 MG tablet Take 1 tablet (10 mg total) by mouth 3 (three) times daily as needed for muscle spasms. Patient not taking: Reported on 05/16/2016 11/22/15   Hagler, Jami L, PA-C  varenicline (CHANTIX CONTINUING MONTH PAK) 1 MG tablet Take 1 tablet (1 mg total) by mouth 2 (two) times daily. Patient not taking: Reported on 05/16/2016 06/15/15   Minna Merritts, MD    Family History  Problem Relation Age of Onset  . Diabetes Father   . Kidney disease Father   . Lung cancer Father     smoker  . Hyperlipidemia Father   . Hypertension Father   . Lung cancer Mother     smoke  . Hypertension Mother   . Colon cancer Neg Hx   . Pancreatic cancer Neg Hx   . Stomach cancer Neg Hx      Social History  Substance Use Topics  . Smoking status: Current Every Day Smoker    Years: 24.00    Types: Cigarettes  . Smokeless tobacco: Never Used     Comment: down to 2 ciggs a day.  . Alcohol use 8.4 oz/week    14 Cans of beer per week     Comment: 4 beers per night    Allergies as of 05/16/2016 - Review Complete 05/16/2016  Allergen Reaction Noted  . Crestor [rosuvastatin] Other (See Comments) 12/25/2012  . Hydrocodone bitartrate er  07/01/2014  . Lipitor [atorvastatin] Other (See Comments) 05/01/2015  . Topamax [topiramate]  04/26/2016    Review of Systems:    All systems reviewed and negative except where noted in HPI.   Physical Exam:  Vital signs in last 24 hours: Temp:  [97.6 F (36.4 C)-98.3 F (36.8 C)] 97.6 F  (36.4 C) (05/08 0409) Pulse Rate:  [62-72] 70 (05/08 0409) Resp:  [14-20] 20 (05/08 0409) BP: (158-190)/(69-88) 172/73 (05/08 0409) SpO2:  [95 %-98 %] 96 % (05/08 0409) Weight:  [252 lb (114.3 kg)-255 lb 9.6 oz (115.9 kg)] 255 lb 9.6 oz (115.9 kg) (05/07 2313) Last BM Date: 05/16/16 General:   Pleasant, cooperative in NAD Head:  Normocephalic and atraumatic. Eyes:   No icterus.   Conjunctiva pink. PERRLA. Ears:  Normal auditory acuity. Neck:  Supple; no masses or thyroidomegaly  Lungs: Respirations even and unlabored. Lungs clear to auscultation bilaterally.   No wheezes, crackles, or rhonchi.  Heart:  Regular rate and rhythm;  Without murmur, clicks, rubs or gallops Abdomen:  Soft, nondistended, nontender. Normal bowel sounds. No appreciable masses or hepatomegaly.  No rebound or guarding.  Rectal:  Not performed. Msk:  Symmetrical without gross deformities.    Extremities:  Without edema, cyanosis or clubbing. Neurologic:  Alert and oriented x3;  Patient has a tremor Skin:  Intact without significant lesions or rashes. Cervical Nodes:  No significant cervical adenopathy. Psych:  Alert and cooperative. Normal affect.  LAB RESULTS:  Recent Labs  05/16/16 1439 05/16/16 2224 05/17/16 0417  WBC 11.3*  --  8.3  HGB 12.3* 12.6* 12.6*  HCT 35.2*  --  35.7*  PLT 88*  --  86*   BMET  Recent Labs  05/16/16 1439 05/17/16 0417  NA 135 138  K 4.3 4.2  CL 102 105  CO2 23 26  GLUCOSE 137* 115*  BUN 13 11  CREATININE 0.68 0.61  CALCIUM 9.0 9.1   LFT  Recent Labs  05/16/16 1439  PROT 7.1  ALBUMIN 3.4*  AST 72*  ALT 32  ALKPHOS 78  BILITOT 2.9*   PT/INR  Recent Labs  05/16/16 1519  LABPROT 21.0*  INR 1.79    STUDIES: No results found.    Impression / Plan:   Peter [name] is a 61 y.o. y/o male with With melanotic stools and slightly lower hemoglobin that his baseline. The patient will be set up for an EGD to look for the source of his melanotic stools.  The patient should be treated with a PPI for possible peptic ulcer disease  The patient has been explained the plan and agrees with it.   Thank you for involving me in the care of this patient.      LOS: 0 days   Lucilla Lame, MD  05/17/2016, 9:11 AM   Note: This dictation was prepared with Dragon dictation along with smaller phrase technology. Any transcriptional errors that result from this process are unintentional.

## 2016-05-17 NOTE — Transfer of Care (Signed)
Immediate Anesthesia Transfer of Care Note  Patient: Peter [name]  Procedure(s) Performed: Procedure(s): ESOPHAGOGASTRODUODENOSCOPY (EGD) WITH PROPOFOL (N/A)  Patient Location: PACU  Anesthesia Type:General  Level of Consciousness: awake and alert   Airway & Oxygen Therapy: Patient Spontanous Breathing and Patient connected to nasal cannula oxygen  Post-op Assessment: Report given to RN and Post -op Vital signs reviewed and stable  Post vital signs: Reviewed and stable  Last Vitals:  Vitals:   05/17/16 0927 05/17/16 0936  BP: (!) 180/82 (!) 176/83  Pulse: 72 70  Resp: 16 20  Temp:  36.8 C    Last Pain:  Vitals:   05/17/16 0936  TempSrc: Tympanic  PainSc:       Patients Stated Pain Goal: 0 (35/52/17 4715)  Complications: No apparent anesthesia complications

## 2016-05-17 NOTE — Anesthesia Postprocedure Evaluation (Signed)
Anesthesia Post Note  Patient: Peter [name]  Procedure(s) Performed: Procedure(s) (LRB): ESOPHAGOGASTRODUODENOSCOPY (EGD) WITH PROPOFOL (N/A)  Patient location during evaluation: Endoscopy Anesthesia Type: General Level of consciousness: awake and alert Pain management: pain level controlled Vital Signs Assessment: post-procedure vital signs reviewed and stable Respiratory status: spontaneous breathing, nonlabored ventilation, respiratory function stable and patient connected to nasal cannula oxygen Cardiovascular status: blood pressure returned to baseline and stable Postop Assessment: no signs of nausea or vomiting Anesthetic complications: no     Last Vitals:  Vitals:   05/17/16 1200 05/17/16 1202  BP: (!) 190/80 (!) 176/92  Pulse: 60 (!) 58  Resp: 16 15  Temp:      Last Pain:  Vitals:   05/17/16 1140  TempSrc: Tympanic  PainSc:                  Jakel,Danyell Awbrey S

## 2016-05-17 NOTE — Telephone Encounter (Signed)
Pt wife called, states pt is in [facility] with a GI bleed and is on Eliquis. She just wanted Dr. Rockey Situ to know.

## 2016-05-17 NOTE — Anesthesia Post-op Follow-up Note (Cosign Needed)
Anesthesia QCDR form completed.        

## 2016-05-17 NOTE — Anesthesia Preprocedure Evaluation (Addendum)
Anesthesia Evaluation  Patient identified by MRN, date of birth, ID band Patient awake    Reviewed: Allergy & Precautions, NPO status , Patient's Chart, lab work & pertinent test results, reviewed documented beta blocker date and time   Airway Mallampati: III  TM Distance: >3 FB     Dental  (+) Chipped   Pulmonary Current Smoker,           Cardiovascular hypertension, Pt. on medications and Pt. on home beta blockers + CAD and + Peripheral Vascular Disease  + Valvular Problems/Murmurs      Neuro/Psych Seizures -,  PSYCHIATRIC DISORDERS Anxiety  Neuromuscular disease    GI/Hepatic GERD  ,  Endo/Other    Renal/GU      Musculoskeletal  (+) Arthritis ,   Abdominal   Peds  Hematology   Anesthesia Other Findings Gout. Hx of A flutter. First deg AV block. Decreased platelets. Hx of ETOH.  Reproductive/Obstetrics                           Anesthesia Physical Anesthesia Plan  ASA: III  Anesthesia Plan: General   Post-op Pain Management:    Induction: Intravenous  Airway Management Planned:   Additional Equipment:   Intra-op Plan:   Post-operative Plan:   Informed Consent: I have reviewed the patients History and Physical, chart, labs and discussed the procedure including the risks, benefits and alternatives for the proposed anesthesia with the patient or authorized representative who has indicated his/her understanding and acceptance.     Plan Discussed with: CRNA  Anesthesia Plan Comments:         Anesthesia Quick Evaluation

## 2016-05-17 NOTE — Care Management (Signed)
Placed on observation for gi bleed. Hgb remains over 12. Gi consulting. Independent in all adls, denies issues accessing medical care, obtaining medications or with transportation.  Current with  PCP.  No discharge needs identified at present by care manager or members of care team

## 2016-05-17 NOTE — Discharge Summary (Signed)
[facility] [place] NAME: Peter [name]    MR#:  [id number]  DATE OF BIRTH:  December 19, 1955  DATE OF ADMISSION:  05/16/2016   ADMITTING PHYSICIAN: Henreitta Leber, MD  DATE OF DISCHARGE:05/17/2016  PRIMARY CARE PHYSICIAN: Coral Spikes, DO   ADMISSION DIAGNOSIS:  Melena [K92.1] Gastrointestinal hemorrhage, unspecified gastrointestinal hemorrhage type [K92.2] Anemia, unspecified type [D64.9] DISCHARGE DIAGNOSIS:  Active Problems:   GI bleed   Melena  SECONDARY DIAGNOSIS:   Past Medical History:  Diagnosis Date  . Abnormality of gait 09/03/2012  . Alcohol abuse   . Anxiety   . Arthritis   . Arthritis   . Esophageal stricture   . GERD (gastroesophageal reflux disease)   . Gout   . Hyperlipidemia   . Hypertension   . Internal hemorrhoids   . Lesion of ulnar nerve 09/03/2012   Right ulnar neuropathy  . Murmur, cardiac   . Palpitations   . Prostate cancer ([place]) 2016   treated with radioactive seed implant  . Seizures ([place])    pt states he had seizure 12/14  . Tubular adenoma of colon 03/2008   HOSPITAL COURSE:   61 year old male with past medical history of chronic atrial fibrillation,COPD, history of prostate cancer, hypertension, hyperlipidemia, GERD with history of esophageal stricture, alcohol abuse, osteoarthritis who presents to the hospital due to melanotic stools.  1. Upper GI bleed-suspected diagnosis given patient's melanotic stools. But normal EGD.  Eliquis was hold. Stable serial hemoglobins. He is treated with IV Protonix.  EGD: normal.  2. Hx of chronic a. Fib - currently rate controlled.  - cont. Bystolic.  Hold eliquis. f/u Dr. Rockey Situ as outpatient.   3. Essential hypertension-continue clonidine, lisinopril Bystolic.  4.history of gout-no acute attack. Continue colchicine.  5. History of neuropathy-continue gabapentin.  6. BPH-continue Flomax.  7. Thrombocytopenia. f/u CBC as  outpatient.  DISCHARGE CONDITIONS:  Stable, discharge to home today. CONSULTS OBTAINED:  Treatment Team:  Lucilla Lame, MD Jonathon Bellows, MD DRUG ALLERGIES:   Allergies  Allergen Reactions  . Crestor [Rosuvastatin] Other (See Comments)    Bloating, swelling of legs, fatty liver  . Hydrocodone Bitartrate Er   . Lipitor [Atorvastatin] Other (See Comments)    Myalgias  . Topamax [Topiramate]     Cognitive clouding   DISCHARGE MEDICATIONS:   Allergies as of 05/17/2016      Reactions   Crestor [rosuvastatin] Other (See Comments)   Bloating, swelling of legs, fatty liver   Hydrocodone Bitartrate Er    Lipitor [atorvastatin] Other (See Comments)   Myalgias   Topamax [topiramate]    Cognitive clouding      Medication List    STOP taking these medications   apixaban 5 MG Tabs tablet Commonly known as:  ELIQUIS     TAKE these medications   Alirocumab 150 MG/ML Sopn Commonly known as:  PRALUENT Inject 1 pen into the skin every 14 (fourteen) days.   cloNIDine 0.1 MG tablet Commonly known as:  CATAPRES Take 1 tablet (0.1 mg total) by mouth 2 (two) times daily.   colchicine 0.6 MG tablet Take 0.6 mg by mouth as needed (gout). For gout flare ups   cyclobenzaprine 10 MG tablet Commonly known as:  FLEXERIL Take 1 tablet (10 mg total) by mouth 3 (three) times daily as needed for muscle spasms.   gabapentin 100 MG capsule Commonly known as:  NEURONTIN Take 1 capsule (100 mg total) by mouth 3 (three) times daily.  What changed:  how much to take  when to take this   hydrOXYzine 50 MG tablet Commonly known as:  ATARAX/VISTARIL Take 50 mg by mouth at bedtime. Reported on 02/04/2015   lisinopril 40 MG tablet Commonly known as:  PRINIVIL,ZESTRIL TAKE 1 TABLET (40 MG TOTAL) BY MOUTH DAILY.   milk thistle 175 MG tablet Take 350 mg by mouth daily.   multivitamin tablet Take 1 tablet by mouth daily. Centrum Plus -Take 1 daily   Nebivolol HCl 20 MG Tabs Commonly known as:   BYSTOLIC Take 1 tablet (20 mg total) by mouth daily.   tamsulosin 0.4 MG Caps capsule Commonly known as:  FLOMAX Take 0.4 mg by mouth daily.   traMADol 50 MG tablet Commonly known as:  ULTRAM TAKE 1 TABLET BY MOUTH EVERY 6 TO 8 HOURS AS NEEDED FOR PAIN. TAKE WITH EXTRA STRENGTH TYLENOL   triamcinolone cream 0.1 % Commonly known as:  KENALOG Apply 1 application topically daily as needed.   varenicline 1 MG tablet Commonly known as:  CHANTIX CONTINUING MONTH PAK Take 1 tablet (1 mg total) by mouth 2 (two) times daily.   VOLTAREN 1 % Gel Generic drug:  diclofenac sodium Apply 2 g topically as needed (knees).        DISCHARGE INSTRUCTIONS:  See AVS.  If you experience worsening of your admission symptoms, develop shortness of breath, life threatening emergency, suicidal or homicidal thoughts you must seek medical attention immediately by calling 911 or calling your MD immediately  if symptoms less severe.  You Must read complete instructions/literature along with all the possible adverse reactions/side effects for all the Medicines you take and that have been prescribed to you. Take any new Medicines after you have completely understood and accpet all the possible adverse reactions/side effects.   Please note  You were cared for by a hospitalist during your hospital stay. If you have any questions about your discharge medications or the care you received while you were in the hospital after you are discharged, you can call the unit and asked to speak with the hospitalist on call if the hospitalist that took care of you is not available. Once you are discharged, your primary care physician will handle any further medical issues. Please note that NO REFILLS for any discharge medications will be authorized once you are discharged, as it is imperative that you return to your primary care physician (or establish a relationship with a primary care physician if you do not have one) for your  aftercare needs so that they can reassess your need for medications and monitor your lab values.    On the day of Discharge:  VITAL SIGNS:  Blood pressure (!) 169/64, pulse (!) 57, temperature 97.6 F (36.4 C), temperature source Tympanic, resp. rate 16, height 6\' 1"  (1.854 m), weight 255 lb (115.7 kg), SpO2 97 %. PHYSICAL EXAMINATION:  GENERAL:  61 y.o.-year-old patient lying in the bed with no acute distress.  EYES: Pupils equal, round, reactive to light and accommodation. No scleral icterus. Extraocular muscles intact.  HEENT: Head atraumatic, normocephalic. Oropharynx and nasopharynx clear.  NECK:  Supple, no jugular venous distention. No thyroid enlargement, no tenderness.  LUNGS: Normal breath sounds bilaterally, no wheezing, rales,rhonchi or crepitation. No use of accessory muscles of respiration.  CARDIOVASCULAR: S1, S2 normal. No murmurs, rubs, or gallops.  ABDOMEN: Soft, non-tender, non-distended. Bowel sounds present. No organomegaly or mass.  EXTREMITIES: No pedal edema, cyanosis, or clubbing.  NEUROLOGIC: Cranial nerves II through  XII are intact. Muscle strength 5/5 in all extremities. Sensation intact. Gait not checked.  PSYCHIATRIC: The patient is alert and oriented x 3.  SKIN: No obvious rash, lesion, or ulcer.  DATA REVIEW:   CBC  Recent Labs Lab 05/17/16 0417  WBC 8.3  HGB 12.6*  HCT 35.7*  PLT 86*    Chemistries   Recent Labs Lab 05/16/16 1439 05/17/16 0417  NA 135 138  K 4.3 4.2  CL 102 105  CO2 23 26  GLUCOSE 137* 115*  BUN 13 11  CREATININE 0.68 0.61  CALCIUM 9.0 9.1  AST 72*  --   ALT 32  --   ALKPHOS 78  --   BILITOT 2.9*  --      Microbiology Results  No results found for this or any previous visit.  RADIOLOGY:  No results found.   Management plans discussed with the patient, his wife and they are in agreement.  CODE STATUS: Full Code   TOTAL TIME TAKING CARE OF THIS PATIENT: 26 minutes.    Demetrios Loll M.D on 05/17/2016 at 1:11  PM  Between 7am to 6pm - Pager - [phone]  After 6pm go to [email or web address] - Proofreader  [facility]  Office  [phone]  CC: Primary care physician; Coral Spikes, DO   Note: This dictation was prepared with Dragon dictation along with smaller phrase technology. Any transcriptional errors that result from this process are unintentional.

## 2016-05-17 NOTE — Telephone Encounter (Signed)
Pt is scheduled for a HFU for a GI bleed on 5/14 @ 11:30.

## 2016-05-17 NOTE — Discharge Instructions (Signed)
Heart healthy diet

## 2016-05-17 NOTE — Op Note (Signed)
[facility] Gastroenterology Patient Name: Peter [name] Procedure Date: 05/17/2016 11:24 AM MRN: [id number] Account #: [id number] Date of Birth: 1955-08-19 Admit Type: Inpatient Age: 61 Room: [facility] ENDO ROOM 4 Gender: Male Note Status: Finalized Procedure:            Upper GI endoscopy Indications:          Melena Providers:            Lucilla Lame MD, MD Referring MD:         [name] MD, MD (Referring MD) Medicines:            Propofol per Anesthesia Complications:        No immediate complications. Procedure:            Pre-Anesthesia Assessment:                       - Prior to the procedure, a History and Physical was                        performed, and patient medications and allergies were                        reviewed. The patient's tolerance of previous                        anesthesia was also reviewed. The risks and benefits of                        the procedure and the sedation options and risks were                        discussed with the patient. All questions were                        answered, and informed consent was obtained. Prior                        Anticoagulants: The patient has taken anticoagulant                        medication, last dose was 1 day prior to procedure. ASA                        Grade Assessment: II - A patient with mild systemic                        disease. After reviewing the risks and benefits, the                        patient was deemed in satisfactory condition to undergo                        the procedure.                       After obtaining informed consent, the endoscope was                        passed under direct vision. Throughout the procedure,  the patient's blood pressure, pulse, and oxygen                        saturations were monitored continuously. The Endoscope                        was introduced through the mouth, and advanced to the         second part of duodenum. The upper GI endoscopy was                        accomplished without difficulty. The patient tolerated                        the procedure well. Findings:      A small hiatal hernia was present.      The Z-line was irregular and was found at the gastroesophageal junction.      The stomach was normal.      The examined duodenum was normal. Impression:           - Small hiatal hernia.                       - Z-line irregular, at the gastroesophageal junction.                       - Normal stomach.                       - Normal examined duodenum.                       - No specimens collected. Recommendation:       - Return patient to hospital ward for ongoing care. Procedure Code(s):    --- Professional ---                       [phone], Esophagogastroduodenoscopy, flexible, transoral;                        diagnostic, including collection of specimen(s) by                        brushing or washing, when performed (separate procedure) Diagnosis Code(s):    --- Professional ---                       K92.1, Melena (includes Hematochezia)                       K22.8, Other specified diseases of esophagus CPT copyright 2016 American Medical Association. All rights reserved. The codes documented in this report are preliminary and upon coder review may  be revised to meet current compliance requirements. Lucilla Lame MD, MD 05/17/2016 11:38:22 AM This report has been signed electronically. Number of Addenda: 0 Note Initiated On: 05/17/2016 11:24 AM      [facility]

## 2016-05-18 ENCOUNTER — Telehealth: Payer: Self-pay | Admitting: Cardiovascular Disease

## 2016-05-18 NOTE — Telephone Encounter (Signed)
Transition Care Management Follow-up Telephone Call  How have you been since you were released from the hospital? Patient stated he feels great, has stopped Eliquis and had normal BM no visible blood. Patient HX A-FIB taking no anticoagulation therapy.    Do you understand why you were in the hospital?Yes   Do you understand the discharge instrcutions? Yes  Items Reviewed:  Medications reviewed:Yes  Allergies reviewed yes  Dietary changes reviewed: yes  Referrals reviewed: yes   Functional Questionnaire:   Activities of Daily Living (ADLs):   He states they are independent in the following: Patient independent in ADLs States they require assistance with the following: No assist needed   Any transportation issues/concerns?: no   Any patient concerns? No   Confirmed importance and date/time of follow-up visits scheduled: Yes   Confirmed with patient if condition begins to worsen call PCP or go to the ER.  Patient was given the Call-a-Nurse line [phone]: Yes

## 2016-05-18 NOTE — Telephone Encounter (Signed)
Pt calling stating he went to Emergency room  And they did everything for the GI bleed he was having Everything turned out good He was advised to call us since it may have been the Eliquis  He thinks he should be placed on something Pt states he has been off that for about 24 hours He also states if we need to do any labs to call that into [facility]    He states he will be free after today 2 pm

## 2016-05-18 NOTE — Telephone Encounter (Signed)
Pt's Eliquis was stopped in the ED 2/2 GI bleed. They did not restart any anticoagulation and pt is nervous about this. He would like to know if he needs to be on something else or if/when he should resume Eliquis.

## 2016-05-18 NOTE — Telephone Encounter (Signed)
First attempt made for TCM left message for patient to return call to office.

## 2016-05-19 NOTE — Telephone Encounter (Signed)
Would recommend he wait at least one week Better would be 2 weeks then restart anticoagulation with eliquis 5 mg twice a day This is the best protection for his arrhythmia Would recommend if he has recurrent bleeding that he get in touch with Dr. [name] for further workup

## 2016-05-20 NOTE — Telephone Encounter (Signed)
Spoke w/ pt.  Advised him of Dr. Gollan's recommendation.  He verbalizes understanding and will call back w/ any further questions or concerns.  

## 2016-05-20 NOTE — Telephone Encounter (Signed)
Left message for pt to call back  °

## 2016-05-23 ENCOUNTER — Ambulatory Visit: Payer: 59 | Admitting: Family Medicine

## 2016-05-23 ENCOUNTER — Telehealth: Payer: Self-pay | Admitting: *Deleted

## 2016-05-23 NOTE — Telephone Encounter (Signed)
Supportive care. He can be seen if he likes.

## 2016-05-23 NOTE — Telephone Encounter (Signed)
Patient reported having bronchitis and upper raspatory infection. Patient would like to have antibiotic. Patient reported congestion, cough, yellow mucus, and wheezing, no other symptoms .  Pharmacy [facility]  Pt contact [phone]  FYI Patient requested avoiding other pt's due to him catching this after a visit to the hospital.

## 2016-05-23 NOTE — Telephone Encounter (Signed)
Reason for call:cough  Symptoms: cough, wheezing, yellow mucus, no fever , no fever chills  Duration Tuesday Medications: Corcidin OTC  Last seen for this problem: Seen by: Last Monday , thought had GI , did endoscopy Tried to schedule patient for appointment for today but patient declined preferred to wait until today.    He cancelled hospital follow up appointment today, patient stated no need to follow up with PCP will follow up .

## 2016-05-23 NOTE — Telephone Encounter (Signed)
Office visit scheduled for tomorrow.

## 2016-05-24 ENCOUNTER — Encounter: Payer: Self-pay | Admitting: Gastroenterology

## 2016-05-24 ENCOUNTER — Ambulatory Visit (INDEPENDENT_AMBULATORY_CARE_PROVIDER_SITE_OTHER): Payer: 59 | Admitting: Family Medicine

## 2016-05-24 DIAGNOSIS — J988 Other specified respiratory disorders: Secondary | ICD-10-CM

## 2016-05-24 MED ORDER — AMOXICILLIN-POT CLAVULANATE 875-125 MG PO TABS: 1.0000 | ORAL_TABLET | Freq: Two times a day (BID) | ORAL | 0 refills | Status: DC

## 2016-05-24 NOTE — Patient Instructions (Signed)
Antibiotic as prescribed.  Feel better.  Take care  Dr. Merryl Buckels  

## 2016-05-24 NOTE — Assessment & Plan Note (Signed)
New problem. Patient with symptoms of sinusitis as well as bronchitis. Given the duration of his illness and recent hospitalization, I am treating him empirically with Augmentin.

## 2016-05-24 NOTE — Progress Notes (Signed)
Subjective:  Patient ID: Peter [name], male    DOB: [birth year]  Age: 61 y.o. MRN: [id number]  CC: Cough, congestion  HPI:  61 year old male presents with the above complaints.  Patient was recently admitted for melena. EGD was negative. Hemoglobin stable. His Eliquis has been held.  Patient states that he has been sick since he left the hospital. He states these had a productive cough as well as head and chest congestion. He reports discolored nasal discharge. Cough is currently dry. No associated fever. No reports of shortness of breath. No known exacerbating factors. No medications or interventions tried. No other complaints or concerns at this time.  Social Hx   Social History   Social History  . Marital status: Married    Spouse name: N/A  . Number of children: 0  . Years of education: hs   Occupational History  . Retired [employer]  . 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 [employer]   Social History Main Topics  . Smoking status: Current Every Day Smoker    Years: 24.00    Types: Cigarettes  . Smokeless tobacco: Never Used     Comment: down to 2 ciggs a day.  . Alcohol use 8.4 oz/week    14 Cans of beer per week     Comment: 4 beers per night  . Drug use: No     Comment: 1970's occ.   Marland Kitchen Sexual activity: Not Asked   Other Topics Concern  . None   Social History Narrative   Lives with wife   Caffeine use: Drinks one cup coffee per morning   No soda or tea       Review of Systems  Constitutional: Negative for fever.  HENT: Positive for congestion and sinus pressure.   Respiratory: Positive for cough.    Objective:  BP (!) 169/59   Pulse 64   Temp 98.2 F (36.8 C) (Oral)   Wt 253 lb 6 oz (114.9 kg)   SpO2 95%   BMI 33.43 kg/m   BP/Weight 05/24/2016 02/17/7865 06/16/2092  Systolic BP 709 628 -  Diastolic BP 59 64 -  Wt. (Lbs) 253.38 255 -  BMI 33.43 - 33.64   Physical Exam  Constitutional: He is oriented to person, place, and time. He appears well-developed. No  distress.  Cardiovascular: Normal rate and regular rhythm.   Pulmonary/Chest: Effort normal.  Faint wheezing.  Neurological: He is alert and oriented to person, place, and time.  Psychiatric: He has a normal mood and affect.  Vitals reviewed.   Lab Results  Component Value Date   WBC 8.3 05/17/2016   HGB 12.6 (L) 05/17/2016   HCT 35.7 (L) 05/17/2016   PLT 86 (L) 05/17/2016   GLUCOSE 115 (H) 05/17/2016   CHOL 161 11/20/2015   TRIG 143 11/20/2015   HDL 46 11/20/2015   LDLCALC 86 11/20/2015   ALT 32 05/16/2016   AST 72 (H) 05/16/2016   NA 138 05/17/2016   K 4.2 05/17/2016   CL 105 05/17/2016   CREATININE 0.61 05/17/2016   BUN 11 05/17/2016   CO2 26 05/17/2016   TSH 1.420 03/23/2016   INR 1.79 05/16/2016   HGBA1C 5.5 02/05/2015    Assessment & Plan:   Problem List Items Addressed This Visit    Respiratory infection    New problem. Patient with symptoms of sinusitis as well as bronchitis. Given the duration of his illness and recent hospitalization, I am treating him empirically with Augmentin.  Meds ordered this encounter  Medications  . amoxicillin-clavulanate (AUGMENTIN) 875-125 MG tablet    Sig: Take 1 tablet by mouth 2 (two) times daily.    Dispense:  14 tablet    Refill:  0   Follow-up: PRN  [place]

## 2016-05-26 ENCOUNTER — Other Ambulatory Visit: Payer: Self-pay | Admitting: Family Medicine

## 2016-05-26 ENCOUNTER — Telehealth: Payer: Self-pay | Admitting: Family Medicine

## 2016-05-26 ENCOUNTER — Telehealth: Payer: Self-pay | Admitting: *Deleted

## 2016-05-26 MED ORDER — CLARITHROMYCIN 500 MG PO TABS: 500.0000 mg | ORAL_TABLET | Freq: Two times a day (BID) | ORAL | 0 refills | Status: DC

## 2016-05-26 NOTE — Telephone Encounter (Signed)
Pt states that the amoxicillin-clavulanate (AUGMENTIN) 875-125 MG tablet are not working and wanted to know if you would call in  Biactin. Please advise

## 2016-05-26 NOTE — Telephone Encounter (Signed)
Patient stated that his cough is progressing and he's seeing no improvements .  Pt requested a call [phone]

## 2016-05-26 NOTE — Telephone Encounter (Signed)
LVM letting pt know rx was sent

## 2016-05-26 NOTE — Telephone Encounter (Signed)
Its only been 2 days. Continue medication.

## 2016-05-27 NOTE — Telephone Encounter (Signed)
Spoke with pharmacy and everything taken care of.

## 2016-05-27 NOTE — Telephone Encounter (Signed)
Patient stated that pharmacy did not receive Rx Please call pt when this is called in  Contact [phone]

## 2016-05-30 DIAGNOSIS — I10 Essential (primary) hypertension: Secondary | ICD-10-CM | POA: Diagnosis not present

## 2016-05-30 DIAGNOSIS — I483 Typical atrial flutter: Secondary | ICD-10-CM | POA: Diagnosis not present

## 2016-05-30 DIAGNOSIS — I2510 Atherosclerotic heart disease of native coronary artery without angina pectoris: Secondary | ICD-10-CM | POA: Diagnosis not present

## 2016-05-30 DIAGNOSIS — I700 Atherosclerosis of aorta: Secondary | ICD-10-CM | POA: Diagnosis not present

## 2016-05-30 NOTE — Telephone Encounter (Signed)
Patient stated that he continues to have a productive cough, yellow mucus with a headache.  Pt stated that he's not worse however he's not better. He has concerns of getting pneumonia and possibly may need a Xray . Please call pt Pt contact [phone]

## 2016-05-31 ENCOUNTER — Telehealth: Payer: Self-pay

## 2016-05-31 ENCOUNTER — Other Ambulatory Visit: Payer: Self-pay

## 2016-05-31 MED ORDER — CLARITHROMYCIN 500 MG PO TABS: 500.0000 mg | ORAL_TABLET | Freq: Two times a day (BID) | ORAL | 0 refills | Status: DC

## 2016-05-31 NOTE — Telephone Encounter (Signed)
Reason for call: Symptoms: productive cough dry  , chest congestion, nasal congestion in am,  frontal headache eye pressure,  Gotten some better on Biaxin  Duration 3 weeks  Medications:Biaxin,  Last seen for this problem:05/24/16  Seen by: Please advise

## 2016-05-31 NOTE — Telephone Encounter (Signed)
Spoke with patient and advise patient he had a Rx for Biaxin sent to wrong pharmacy, resent Rx to [facility] per patient request. Also advise patient he may want to try OTC medication for other sx such as running nose and to also take some Probiotics for to prevent any further risk from taking long term antibiotics.

## 2016-05-31 NOTE — Telephone Encounter (Signed)
No indication for further antibiotic. Supportive care.

## 2016-06-01 NOTE — Telephone Encounter (Signed)
See other message dated 05/31/16 where patient was spoken to and advised.

## 2016-06-07 ENCOUNTER — Telehealth: Payer: Self-pay | Admitting: Family Medicine

## 2016-06-07 ENCOUNTER — Ambulatory Visit: Payer: 59 | Admitting: Family Medicine

## 2016-06-07 NOTE — Telephone Encounter (Signed)
No charge, thanks 

## 2016-06-07 NOTE — Telephone Encounter (Signed)
Okay. Don't have to charge. If he is that ill he should consider going to urgent care or the ER.

## 2016-06-07 NOTE — Telephone Encounter (Signed)
Please advise, thanks.

## 2016-06-07 NOTE — Telephone Encounter (Signed)
FYI - Pt called and cancelled appt. Pt states that he is prematurely made his appt for today. Pt states that he is too tired to drive and does not feel sage. Pt is rescheduled for tomorrow.

## 2016-06-08 ENCOUNTER — Encounter: Payer: Self-pay | Admitting: Family Medicine

## 2016-06-08 ENCOUNTER — Ambulatory Visit (INDEPENDENT_AMBULATORY_CARE_PROVIDER_SITE_OTHER): Payer: 59 | Admitting: Family Medicine

## 2016-06-08 DIAGNOSIS — J0100 Acute maxillary sinusitis, unspecified: Secondary | ICD-10-CM | POA: Diagnosis not present

## 2016-06-08 DIAGNOSIS — J329 Chronic sinusitis, unspecified: Secondary | ICD-10-CM | POA: Insufficient documentation

## 2016-06-08 MED ORDER — LEVOFLOXACIN 500 MG PO TABS: 500.0000 mg | ORAL_TABLET | Freq: Every day | ORAL | 0 refills | Status: DC

## 2016-06-08 NOTE — Assessment & Plan Note (Signed)
New problem. Treating with Levaquin.

## 2016-06-08 NOTE — Progress Notes (Signed)
Subjective:  Patient ID: Peter [name], male    DOB: [birth year]  Age: 61 y.o. MRN: [id number]  CC: Not improving, recent respiratory illness  HPI:  61 year old male with an extensive PMH presents with the above complaints.  Patient was recently seen on 5/15. He presented with symptoms/signs of bronchitis and sinusitis. He was treated with augmentin. This was subsequently changed to Biaxin per patient request given lack of response. He presents today with continued respiratory symptoms. He reports severe fatigue, continued congestion, sinus pressure and nasal discharge (discolored/yellow). No fever. He has stopped the Biaxin due to leg swelling. No known exacerbating or relieving factors. No other complaints or concerns at this time.  Social Hx   Social History   Social History  . Marital status: Married    Spouse name: N/A  . Number of children: 0  . Years of education: hs   Occupational History  . Retired [employer]  . 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   Social History Main Topics  . Smoking status: Current Every Day Smoker    Years: 24.00    Types: Cigarettes  . Smokeless tobacco: Never Used     Comment: down to 2 ciggs a day.  . Alcohol use 8.4 oz/week    14 Cans of beer per week     Comment: 4 beers per night  . Drug use: No     Comment: 1970's occ.   Marland Kitchen Sexual activity: Not Asked   Other Topics Concern  . None   Social History Narrative   Lives with wife   Caffeine use: Drinks one cup coffee per morning   No soda or tea       Review of Systems  Constitutional: Positive for fatigue.  HENT: Positive for congestion, sinus pain and sinus pressure.    Objective:  BP (!) 170/86 (BP Location: Left Arm, Patient Position: Sitting, Cuff Size: Normal)   Pulse (!) 59   Temp 98.5 F (36.9 C) (Oral)   Resp 12   Wt 252 lb 2 oz (114.4 kg)   SpO2 96%   BMI 33.26 kg/m   BP/Weight 06/08/2016 03/10/6008 09/12/2353  Systolic BP 732 202 542  Diastolic BP 86 59 64  Wt. (Lbs)  252.13 253.38 255  BMI 33.26 33.43 -    Physical Exam  Constitutional: He is oriented to person, place, and time. He appears well-developed. No distress.  HENT:  Head: Normocephalic and atraumatic.  Mouth/Throat: Oropharynx is clear and moist.  Severe sinus tenderness to palpation (maxillary).  Cardiovascular: Normal rate and regular rhythm.   Pulmonary/Chest: Effort normal and breath sounds normal. He has no wheezes. He has no rales.  Neurological: He is alert and oriented to person, place, and time.  Psychiatric: He has a normal mood and affect.  Vitals reviewed.   Lab Results  Component Value Date   WBC 8.3 05/17/2016   HGB 12.6 (L) 05/17/2016   HCT 35.7 (L) 05/17/2016   PLT 86 (L) 05/17/2016   GLUCOSE 115 (H) 05/17/2016   CHOL 161 11/20/2015   TRIG 143 11/20/2015   HDL 46 11/20/2015   LDLCALC 86 11/20/2015   ALT 32 05/16/2016   AST 72 (H) 05/16/2016   NA 138 05/17/2016   K 4.2 05/17/2016   CL 105 05/17/2016   CREATININE 0.61 05/17/2016   BUN 11 05/17/2016   CO2 26 05/17/2016   TSH 1.420 03/23/2016   INR 1.79 05/16/2016   HGBA1C 5.5 02/05/2015    Assessment &  Plan:   Problem List Items Addressed This Visit      Respiratory   Sinusitis    New problem. Treating with Levaquin.       Relevant Medications   levofloxacin (LEVAQUIN) 500 MG tablet      Meds ordered this encounter  Medications  . DISCONTD: levofloxacin (LEVAQUIN) 500 MG tablet    Sig: Take 1 tablet (500 mg total) by mouth daily.    Dispense:  7 tablet    Refill:  0  . levofloxacin (LEVAQUIN) 500 MG tablet    Sig: Take 1 tablet (500 mg total) by mouth daily.    Dispense:  7 tablet    Refill:  0   Follow-up: PRN  [place]

## 2016-06-08 NOTE — Telephone Encounter (Signed)
done

## 2016-06-08 NOTE — Patient Instructions (Signed)
Try the levaquin.  If you fail to improve, please let me know.  Take care  Dr. Lacinda Axon

## 2016-06-17 ENCOUNTER — Telehealth: Payer: Self-pay | Admitting: Cardiovascular Disease

## 2016-06-17 NOTE — Telephone Encounter (Signed)
Spoke w/ pt.  Advised him that we do not have results from [facility].  He states that his wife works at [employer] and he will have her look into this.  He also asks when to resume his Elquis after his recent hospitalization for GI bleed.  Advised him that per our conversation on 05/18/16, he was resume this in 2 weeks.  He has not done so, but will resume Eliquis tonight.  Asked him to call back w/ any further questions or concerns.

## 2016-06-17 NOTE — Telephone Encounter (Signed)
Patient calling re lab work done 3 weeks ago at [facility] please call asap (per patient )

## 2016-06-20 ENCOUNTER — Telehealth: Payer: Self-pay | Admitting: Family Medicine

## 2016-06-20 ENCOUNTER — Encounter: Payer: Self-pay | Admitting: Cardiovascular Disease

## 2016-06-20 NOTE — Telephone Encounter (Signed)
Pt called and stated that he is still having issues with sinusitis and wants to know if he needs to come back in to be evaluated or can we call something in for him. He is not getting better but doesn't feel worse. Please advise, thank you!  Call pt @ [phone]

## 2016-06-21 ENCOUNTER — Other Ambulatory Visit (INDEPENDENT_AMBULATORY_CARE_PROVIDER_SITE_OTHER): Payer: Self-pay | Admitting: Physical Medicine and Rehabilitation

## 2016-06-21 DIAGNOSIS — Z85828 Personal history of other malignant neoplasm of skin: Secondary | ICD-10-CM | POA: Diagnosis not present

## 2016-06-21 DIAGNOSIS — D229 Melanocytic nevi, unspecified: Secondary | ICD-10-CM | POA: Diagnosis not present

## 2016-06-21 DIAGNOSIS — L821 Other seborrheic keratosis: Secondary | ICD-10-CM | POA: Diagnosis not present

## 2016-06-21 DIAGNOSIS — L570 Actinic keratosis: Secondary | ICD-10-CM | POA: Diagnosis not present

## 2016-06-21 NOTE — Telephone Encounter (Signed)
Patient advised of below .  He will be seeing ENT next week.

## 2016-06-21 NOTE — Telephone Encounter (Signed)
Would let it run its course. He can see ENT if he desires.

## 2016-06-21 NOTE — Telephone Encounter (Signed)
Spoke with pt and he stated that he is not getting any better but that he is not getting any worse either. He stated that he has finished his second round of antibiotics. He stated that he is still having sinus pressure, head congestion with some blood in it, fatigued, and no appetite. He stated that he does not have a fever or any diarrhea. He also stated that he is still taking the OTC sinus medication and using the flonase because it is the only thing that will not affect his bp. The pt is wanting to know if there is anything else that he can do or if he needs to be seen again. Please advise.

## 2016-06-27 NOTE — Telephone Encounter (Signed)
Please advise 

## 2016-06-28 NOTE — Telephone Encounter (Signed)
Faxed

## 2016-06-30 DIAGNOSIS — J0100 Acute maxillary sinusitis, unspecified: Secondary | ICD-10-CM | POA: Diagnosis not present

## 2016-06-30 DIAGNOSIS — J301 Allergic rhinitis due to pollen: Secondary | ICD-10-CM | POA: Diagnosis not present

## 2016-07-06 MED ORDER — GABAPENTIN 300 MG PO CAPS: 300.0000 mg | ORAL_CAPSULE | Freq: Three times a day (TID) | ORAL | 3 refills | Status: DC

## 2016-07-06 NOTE — Telephone Encounter (Signed)
I called patient. He has gained some benefit with the gabapentin at the 100 mg dose taking 200 mg twice daily. He is tolerating the education well, he did not tolerate Topamax.  I will go up on the gabapentin taking 300 mg 3 times daily. He will call for any dose adjustments.

## 2016-07-06 NOTE — Telephone Encounter (Signed)
Patient calling to discuss quivering all over his body. gabapentin (NEURONTIN) 100 MG capsule helps some but not much. He wants to know if he can discuss Parkinson's disease.

## 2016-07-06 NOTE — Addendum Note (Signed)
Addended by: Kathrynn Ducking on: 07/06/2016 04:51 PM   Modules accepted: Orders

## 2016-07-08 DIAGNOSIS — J320 Chronic maxillary sinusitis: Secondary | ICD-10-CM | POA: Diagnosis not present

## 2016-07-25 DIAGNOSIS — J324 Chronic pansinusitis: Secondary | ICD-10-CM | POA: Diagnosis not present

## 2016-07-25 DIAGNOSIS — J0100 Acute maxillary sinusitis, unspecified: Secondary | ICD-10-CM | POA: Diagnosis not present

## 2016-07-25 DIAGNOSIS — J340 Abscess, furuncle and carbuncle of nose: Secondary | ICD-10-CM | POA: Diagnosis not present

## 2016-07-26 ENCOUNTER — Telehealth: Payer: Self-pay | Admitting: Cardiovascular Disease

## 2016-07-26 NOTE — Telephone Encounter (Signed)
Received cardiac clearance request for pt to proceed w/ septoplasty, b/l inferior turbinates, b/l maxillary antrostomies w/ Dr. Pryor Ochoa.  DOS has not been scheduled yet pending this clearance.  Please route to [facility] @ [phone].

## 2016-07-28 NOTE — Telephone Encounter (Signed)
Acceptable risk for surgery, He will need to stop eliquis 2 days prior to surgery Restart when surgeon feels comfortable

## 2016-07-28 NOTE — Telephone Encounter (Signed)
Routed to fax # provided. 

## 2016-08-01 ENCOUNTER — Other Ambulatory Visit (INDEPENDENT_AMBULATORY_CARE_PROVIDER_SITE_OTHER): Payer: Self-pay | Admitting: Physical Medicine and Rehabilitation

## 2016-08-01 NOTE — Telephone Encounter (Signed)
Tramadol Refill request

## 2016-08-01 NOTE — Telephone Encounter (Signed)
Manuela Schwartz with [facility] called concerning Rx for Tramadol for patient.  CB# is [phone].  Please advise.

## 2016-08-02 ENCOUNTER — Telehealth (INDEPENDENT_AMBULATORY_CARE_PROVIDER_SITE_OTHER): Payer: Self-pay | Admitting: Radiology

## 2016-08-02 DIAGNOSIS — J301 Allergic rhinitis due to pollen: Secondary | ICD-10-CM | POA: Diagnosis not present

## 2016-08-02 DIAGNOSIS — J320 Chronic maxillary sinusitis: Secondary | ICD-10-CM | POA: Diagnosis not present

## 2016-08-02 DIAGNOSIS — J342 Deviated nasal septum: Secondary | ICD-10-CM | POA: Diagnosis not present

## 2016-08-02 NOTE — Telephone Encounter (Signed)
I am going to refuse a refill of tramadol until I see him as an office visit. In February we had wanted him to see Dr. Louanne Skye for violation it does not look like that ever happened. I have not seen the patient in quite some time.

## 2016-08-02 NOTE — Telephone Encounter (Signed)
Patient called states that he has had several injections, and that he is on a blood thinner and that he can not take ASA products due to being on the blooder, and that he should have to come in to be seen to get a refill on his pain meds. He states that he would have to drive mins and pay a $40 copay to get his meds.  That he has seen Dr. Ernestina Patches for 10 yrs now for his back and he has a bulging disc and arthritis and he needs hi [place] meds for this.  He states that per the pharmacist that is a good friend of his says that it is not considered a opioid that it is a mild narcotic that he should be able to get it filled. --------------------- I spoke with Dr. Ernestina Patches about this and he states that either he can come in here and been seen and re-evaluated or he can see if his PCP will rx the meds for him, that he can not continue rxing without an OV. Patient has not been seen since 09/28/15 by Dr. Ernestina Patches.  He needs OV.-------I called pt to advise but there was no answer so I asked him to call me back tomorrow to discuss.

## 2016-08-03 ENCOUNTER — Other Ambulatory Visit: Payer: Self-pay | Admitting: Cardiovascular Disease

## 2016-08-04 ENCOUNTER — Telehealth: Payer: Self-pay | Admitting: Family Medicine

## 2016-08-04 NOTE — Telephone Encounter (Signed)
Pt does not want to be called til after 3pm. Thank you!

## 2016-08-04 NOTE — Telephone Encounter (Signed)
Pt called about needing a refill for traMADol (ULTRAM) 50 MG tablet.  Pharmacy is [facility], [place]  Call pt @ [phone]. Thank you!

## 2016-08-04 NOTE — Telephone Encounter (Signed)
Left voice mail to call back 

## 2016-08-05 NOTE — Telephone Encounter (Signed)
I called patient, left a message. If he is tolerating the current dose of gabapentin, we could go higher on the dose to 400 mg twice daily and on up from there.  He needs to have realistic expectations of the medications, nothing will make the tremor completely disappear, the purpose of the medications is to suppress the tremor.  He is to contact our office if he wants to go higher on the gabapentin dose.

## 2016-08-05 NOTE — Telephone Encounter (Signed)
Taking for back pain , only has 1 left

## 2016-08-05 NOTE — Telephone Encounter (Signed)
Patient called office in reference to gabapentin (NEURONTIN) 300 MG capsule.  Patient states the medication is curving symptoms but not making them stop.  Patient keeps shaking bad.  He would like to know if there is another medication (stronger) to help.  [facility]- [facility] [address]

## 2016-08-05 NOTE — Telephone Encounter (Signed)
What is he taking it for?

## 2016-08-05 NOTE — Telephone Encounter (Signed)
Patient requesting script for tramadol previously rx'd by Dr Ernestina Patches ortho.    Last office visit here 06/08/16 acute  No office visit scheduled

## 2016-08-08 ENCOUNTER — Other Ambulatory Visit: Payer: Self-pay | Admitting: Family Medicine

## 2016-08-08 MED ORDER — TRAMADOL HCL 50 MG PO TABS: 50.0000 mg | ORAL_TABLET | Freq: Three times a day (TID) | ORAL | 0 refills | Status: DC | PRN

## 2016-08-08 NOTE — Telephone Encounter (Signed)
Please fax rx

## 2016-08-09 NOTE — Telephone Encounter (Signed)
Script faxed to [facility] ,left advising patient script faxed

## 2016-08-10 NOTE — Telephone Encounter (Signed)
Patient never returned call  

## 2016-08-11 ENCOUNTER — Telehealth: Payer: Self-pay | Admitting: Neurology

## 2016-08-11 NOTE — Telephone Encounter (Signed)
Patient called and requested to speak with someone regarding the Rx Gabapentin he just recently began taking. He states that his medication says he should take one a day but he is having to take two a day. He would like Dr. Jannifer Franklin to know that he would like an increase in the dosage. He states that the medication is working but he feels that he may need a higher dosage. Please call and advise.

## 2016-08-11 NOTE — Telephone Encounter (Signed)
A prescription for gabapentin was called in at the end of June 2018 to take 300 mg of gabapentin 3 times daily. The patient can increase the dose if he desires.

## 2016-08-26 DIAGNOSIS — J301 Allergic rhinitis due to pollen: Secondary | ICD-10-CM | POA: Diagnosis not present

## 2016-08-29 ENCOUNTER — Telehealth: Payer: Self-pay | Admitting: Family Medicine

## 2016-08-29 DIAGNOSIS — J301 Allergic rhinitis due to pollen: Secondary | ICD-10-CM | POA: Diagnosis not present

## 2016-08-29 NOTE — Telephone Encounter (Signed)
Pt called and stated that they received the letter that Dr. Lacinda Axon is leaving and would like set up with Dr. Caryl Bis.

## 2016-09-08 DIAGNOSIS — J301 Allergic rhinitis due to pollen: Secondary | ICD-10-CM | POA: Diagnosis not present

## 2016-09-15 DIAGNOSIS — J301 Allergic rhinitis due to pollen: Secondary | ICD-10-CM | POA: Diagnosis not present

## 2016-09-19 DIAGNOSIS — J301 Allergic rhinitis due to pollen: Secondary | ICD-10-CM | POA: Diagnosis not present

## 2016-09-20 ENCOUNTER — Telehealth: Payer: Self-pay | Admitting: Neurology

## 2016-09-20 NOTE — Telephone Encounter (Signed)
Noted  

## 2016-09-20 NOTE — Telephone Encounter (Signed)
Pt called in to r/s appt from 9/14 to 11/09/16. He also wanted Dr Jannifer Franklin to know he is taking gabapentin 300mg  bid, he is going to increase to tid.    FYI

## 2016-09-22 DIAGNOSIS — J301 Allergic rhinitis due to pollen: Secondary | ICD-10-CM | POA: Diagnosis not present

## 2016-09-23 ENCOUNTER — Ambulatory Visit: Payer: 59 | Admitting: Neurology

## 2016-09-26 DIAGNOSIS — J301 Allergic rhinitis due to pollen: Secondary | ICD-10-CM | POA: Diagnosis not present

## 2016-09-29 DIAGNOSIS — J301 Allergic rhinitis due to pollen: Secondary | ICD-10-CM | POA: Diagnosis not present

## 2016-09-30 DIAGNOSIS — J301 Allergic rhinitis due to pollen: Secondary | ICD-10-CM | POA: Diagnosis not present

## 2016-10-06 DIAGNOSIS — J301 Allergic rhinitis due to pollen: Secondary | ICD-10-CM | POA: Diagnosis not present

## 2016-10-07 NOTE — Progress Notes (Signed)
Cardiology Office Note  Date:  10/10/2016   ID:  Peter [name], DOB 31-Dec-1955, MRN [id number]  PCP:  [name], MD   Chief Complaint  Patient presents with  . other    6 week follow up. Patiet c/o swelling and chest pain. Meds reviewed verbally with patient.     HPI:  Peter [name] is a 61 year old gentleman with  long history of smoking,  hypertension,  daily alcohol drinking,  prostate cancer ,  statin intolerance ,    underlying liver dysfunction, elevated total bilirubin CAD, CT coronary calcium score 2778 in 04/2015 Stress test 07/2014 no ischemia Cardioversion 11/02/2015 For atrial flutter On praluent who presents for follow-up of his coronary artery disease, hypertension, hyperlipidemia  ?GI bleed, Went to hospital,  EGD with Dr. [name],  Essentially normal procedure, no sign of bleeding  2 days later did not feel well, Saw Dr. Pryor Ochoa, did ABX  Blood work, allergy shots 2 to 3 months Now not sleeping well, etiology unclear Continues to drink alcohol Blood pressure up and down, only taking his clonidine in the evening Systolic pressures 809X on a frequent basis in the mornings Stays on his beta blocker and lisinopril  No regular exercise program previously did not tolerate isosorbide Rare palpitations, was unsure if he was in atrial fibrillation today  Previously total chantix, down to 2 cigs Denies any chest pain concerning for angina  EKG personally reviewed by myself on todays visit Shows normal sinus rhythm rate 65 bpm no significant ST or T-wave changes  Other past medical history reviewed echocardiogram 02/2016 showing normal LV function  Chronic back pain He went to the ER for back pain 11/22/2015 Started On tramadol PRN  He is tolerating praluent , dramatic improvement in his cholesterol down to 160 from 260  Limited by back pain , was told he could get a "shot" "shaking" for 2 years since prostate seeds, stopped librium Still with  heavy ETOH, previous notes indicating 4-5 beers per day Frequent nocturia Has been taking lisinopril 40 mg x 2, presumably because blood pressure was running high, now running out of his medication. Did not call our office  Previous stress test July 2016 showing no ischemia CT scan of the chest, CT coronary calcium score discussed with him in detail, images pulled up in the office. His scores close to 3000. He denies any anginal symptoms. Images show diffuse heavy calcification/calcified atherosclerosis in the LAD, diagonal branch, ostial and proximal RCA. No significant disease noted in the left circumflex.   he reports being debilitated when he was on Crestor, Lipitor He had diffuse muscle pain in his legs, has tried different doses, does not want to retry a statin given the debilitating side effects. Feels it made it impossible to get around, on top of his arthritis, severe knee pain, it was just too much  CT scan of the abdomen shows scattered diffuse mild aortic atherosclerosis.  Lab work reviewed with him showing hemoglobin A1c 5.5 , total cholesterol more than 230   PMH:   has a past medical history of Abnormality of gait (09/03/2012); Alcohol abuse; Anxiety; Arthritis; Arthritis; Esophageal stricture; GERD (gastroesophageal reflux disease); Gout; Hyperlipidemia; Hypertension; Internal hemorrhoids; Lesion of ulnar nerve (09/03/2012); Murmur, cardiac; Palpitations; Prostate cancer ([place]) (2016); Seizures ([place]); and Tubular adenoma of colon (03/2008).  PSH:    Past Surgical History:  Procedure Laterality Date  . CATARACT EXTRACTION W/PHACO Right 07/03/2014   Procedure: CATARACT EXTRACTION PHACO AND INTRAOCULAR LENS PLACEMENT (IOC);  Surgeon: Lyla Glassing, MD;  Location: [facility] ORS;  Service: Ophthalmology;  Laterality: Right;  lot # [id number] H [place]: 00:32.3 AP% 9.1 CDE: 2.92  . CIRCUMCISION    . COLONOSCOPY    . ELBOW SURGERY    . ELECTROPHYSIOLOGIC STUDY N/A 11/02/2015    Procedure: CARDIOVERSION;  Surgeon: Minna Merritts, MD;  Location: [facility] ORS;  Service: Cardiovascular;  Laterality: N/A;  . ESOPHAGOGASTRODUODENOSCOPY (EGD) WITH PROPOFOL N/A 05/17/2016   Procedure: ESOPHAGOGASTRODUODENOSCOPY (EGD) WITH PROPOFOL;  Surgeon: Lucilla Lame, MD;  Location: [facility] ENDOSCOPY;  Service: Endoscopy;  Laterality: N/A;  . EYE SURGERY    . HAND SURGERY Left   . SHOULDER SURGERY      Current Outpatient Prescriptions  Medication Sig Dispense Refill  . Alirocumab (PRALUENT) 150 MG/ML SOPN Inject 1 pen into the skin every 14 (fourteen) days. 2 pen 11  . BYSTOLIC 20 MG TABS TAKE 1 TABLET (20 MG TOTAL) BY MOUTH DAILY. 90 tablet 2  . cloNIDine (CATAPRES) 0.1 MG tablet Take 1 tablet (0.1 mg total) by mouth 2 (two) times daily. 60 tablet 11  . colchicine 0.6 MG tablet Take 0.6 mg by mouth as needed (gout). For gout flare ups    . cyclobenzaprine (FLEXERIL) 10 MG tablet Take 1 tablet (10 mg total) by mouth 3 (three) times daily as needed for muscle spasms. 21 tablet 0  . gabapentin (NEURONTIN) 300 MG capsule Take 1 capsule (300 mg total) by mouth 3 (three) times daily. 90 capsule 3  . hydrOXYzine (ATARAX/VISTARIL) 50 MG tablet Take 50 mg by mouth at bedtime. Reported on 02/04/2015    . levofloxacin (LEVAQUIN) 500 MG tablet Take 1 tablet (500 mg total) by mouth daily. 7 tablet 0  . lisinopril (PRINIVIL,ZESTRIL) 40 MG tablet TAKE 1 TABLET (40 MG TOTAL) BY MOUTH DAILY. 90 tablet 2  . milk thistle 175 MG tablet Take 350 mg by mouth daily.     . Multiple Vitamin (MULTIVITAMIN) tablet Take 1 tablet by mouth daily. Centrum Plus -Take 1 daily    . tamsulosin (FLOMAX) 0.4 MG CAPS capsule Take 0.4 mg by mouth daily.     . traMADol (ULTRAM) 50 MG tablet Take 1 tablet (50 mg total) by mouth every 8 (eight) hours as needed. 90 tablet 0  . triamcinolone cream (KENALOG) 0.1 % Apply 1 application topically daily as needed.     . varenicline (CHANTIX CONTINUING MONTH PAK) 1 MG tablet Take 1 tablet (1  mg total) by mouth 2 (two) times daily. 60 tablet 6  . VOLTAREN 1 % GEL Apply 2 g topically as needed (knees).      No current facility-administered medications for this visit.      Allergies:   Crestor [rosuvastatin]; Dust mite extract; Hydrocodone bitartrate er; Lipitor [atorvastatin]; and Topamax [topiramate]   Social History:  The patient  reports that he has been smoking Cigarettes.  He has smoked for the past 24.00 years. He has never used smokeless tobacco. He reports that he drinks about 8.4 oz of alcohol per week . He reports that he does not use drugs.   Family History:   family history includes Diabetes in his father; Hyperlipidemia in his father; Hypertension in his father and mother; Kidney disease in his father; Lung cancer in his father and mother.    Review of Systems: Review of Systems  Constitutional: Positive for malaise/fatigue.  Respiratory: Negative.   Cardiovascular: Positive for palpitations.  Gastrointestinal: Negative.   Musculoskeletal: Negative.   Psychiatric/Behavioral: The patient has insomnia.  All other systems reviewed and are negative.    PHYSICAL EXAM: VS:  BP (!) 170/80 (BP Location: Left Arm, Patient Position: Sitting, Cuff Size: Normal)   Pulse 65   Ht 6\' 1"  (1.854 m)   Wt 246 lb (111.6 kg)   BMI 32.46 kg/m  , BMI Body mass index is 32.46 kg/m. No change from previous exam GEN: Well nourished, well developed, in no acute distress , obese.mild tremor HEENT: normal  Neck: no JVD, carotid bruits, or masses Cardiac: RRR; 1-2+ SEM RSB murmurs, rubs, or gallops,no edema  Respiratory:  clear to auscultation bilaterally, normal work of breathing GI: soft, nontender, nondistended, + BS MS: no deformity or atrophy  Skin: warm and dry, no rash Neuro:  Strength and sensation are intact Psych: euthymic mood, full affect    Recent Labs: 03/23/2016: TSH 1.420 05/16/2016: ALT 32 05/17/2016: BUN 11; Creatinine, Ser 0.61; Hemoglobin 12.6; Platelets  86; Potassium 4.2; Sodium 138    Lipid Panel Lab Results  Component Value Date   CHOL 161 11/20/2015   HDL 46 11/20/2015   LDLCALC 86 11/20/2015   TRIG 143 11/20/2015      Wt Readings from Last 3 Encounters:  10/10/16 246 lb (111.6 kg)  06/08/16 252 lb 2 oz (114.4 kg)  05/24/16 253 lb 6 oz (114.9 kg)       ASSESSMENT AND PLAN:   Essential hypertension - Plan: EKG 12-Lead Long discussion concerning his blood pressure Does not want to take clonidine twice a day secondary to side effects of fatigue Previous history of leg edema, will avoid calcium channel blockers Did not tolerate isosorbide in the past Potentially could increase his beta blocker but he is having chronic fatigue Recommended he start Cardura 4 mg twice a day, hold the clonidine If blood pressure runs high may need to increase the Cardura up to 8 mg  Pure hypercholesterolemia - Plan: EKG 12-Lead Cholesterol is at goal on the current lipid regimen. No changes to the medications were made. Tolerating the shot, praluent  Typical atrial flutter ([place]) - Plan: EKG 12-Lead Maintaining normal sinus rhythm, changes as above Long discussion concerning anticoagulation Normal EGD, recommended he restart anticoagulation Discussed risk and benefit We'll send a message to Dr. [name].  Coronary artery disease involving native coronary artery of native heart without angina pectoris - Plan: EKG 12-Lead Currently with no symptoms of angina. No further workup at this time. Continue current medication regimen.  Anxiety/fatigue/depression insomnia Previously was on Ambien  Alcohol use Several per day Unclear if this is contribute to his tremor May be affecting his sleep  Smoker We have encouraged him to continue to work on weaning his cigarettes and smoking cessation. He will continue to work on this and does not want any assistance with chantix.     Total encounter time more than 45 minutes  Greater than 50% was  spent in counseling and coordination of care with the patient   Disposition:   F/U  6 months   No orders of the defined types were placed in this encounter.    Signed, Esmond Plants, M.D., Ph.D. 10/10/2016  [place], [place]

## 2016-10-10 ENCOUNTER — Ambulatory Visit (INDEPENDENT_AMBULATORY_CARE_PROVIDER_SITE_OTHER): Payer: 59 | Admitting: Cardiovascular Disease

## 2016-10-10 ENCOUNTER — Encounter: Payer: Self-pay | Admitting: Cardiovascular Disease

## 2016-10-10 VITALS — BP 170/80 | HR 65 | Ht 73.0 in | Wt 246.0 lb

## 2016-10-10 DIAGNOSIS — J301 Allergic rhinitis due to pollen: Secondary | ICD-10-CM | POA: Diagnosis not present

## 2016-10-10 DIAGNOSIS — I700 Atherosclerosis of aorta: Secondary | ICD-10-CM | POA: Diagnosis not present

## 2016-10-10 DIAGNOSIS — I10 Essential (primary) hypertension: Secondary | ICD-10-CM | POA: Diagnosis not present

## 2016-10-10 DIAGNOSIS — I483 Typical atrial flutter: Secondary | ICD-10-CM | POA: Diagnosis not present

## 2016-10-10 DIAGNOSIS — I25118 Atherosclerotic heart disease of native coronary artery with other forms of angina pectoris: Secondary | ICD-10-CM

## 2016-10-10 DIAGNOSIS — Z720 Tobacco use: Secondary | ICD-10-CM

## 2016-10-10 DIAGNOSIS — E7800 Pure hypercholesterolemia, unspecified: Secondary | ICD-10-CM

## 2016-10-10 MED ORDER — DOXAZOSIN MESYLATE 8 MG PO TABS: 8.0000 mg | ORAL_TABLET | Freq: Two times a day (BID) | ORAL | 10 refills | Status: DC

## 2016-10-10 MED ORDER — CLONIDINE HCL 0.1 MG PO TABS: 0.1000 mg | ORAL_TABLET | Freq: Two times a day (BID) | ORAL | 11 refills | Status: DC | PRN

## 2016-10-10 NOTE — Patient Instructions (Addendum)
Medication Instructions:   Please hold the clonidine, take as needed for high blood pressure >180  Take cardura/doxazosin  1/2 pill twice  Monitor pressure  Wait a few days If it runs high, always >150 Then increase up to 1/2 in the AM and 1 in the PM Call the office with numbers  Labwork:  No new labs needed  Testing/Procedures:  No further testing at this time   Follow-Up: It was a pleasure seeing you in the office today. Please call us if you have new issues that need to be addressed before your next appt.  [phone]  Your physician wants you to follow-up in: 6 months.  You will receive a reminder letter in the mail two months in advance. If you don't receive a letter, please call our office to schedule the follow-up appointment.  If you need a refill on your cardiac medications before your next appointment, please call your pharmacy.

## 2016-10-11 ENCOUNTER — Other Ambulatory Visit: Payer: Self-pay | Admitting: Cardiovascular Disease

## 2016-10-13 DIAGNOSIS — J301 Allergic rhinitis due to pollen: Secondary | ICD-10-CM | POA: Diagnosis not present

## 2016-10-17 DIAGNOSIS — J301 Allergic rhinitis due to pollen: Secondary | ICD-10-CM | POA: Diagnosis not present

## 2016-10-20 DIAGNOSIS — J301 Allergic rhinitis due to pollen: Secondary | ICD-10-CM | POA: Diagnosis not present

## 2016-10-21 DIAGNOSIS — J301 Allergic rhinitis due to pollen: Secondary | ICD-10-CM | POA: Diagnosis not present

## 2016-10-24 DIAGNOSIS — J301 Allergic rhinitis due to pollen: Secondary | ICD-10-CM | POA: Diagnosis not present

## 2016-10-27 DIAGNOSIS — J301 Allergic rhinitis due to pollen: Secondary | ICD-10-CM | POA: Diagnosis not present

## 2016-10-31 DIAGNOSIS — J301 Allergic rhinitis due to pollen: Secondary | ICD-10-CM | POA: Diagnosis not present

## 2016-11-03 DIAGNOSIS — J301 Allergic rhinitis due to pollen: Secondary | ICD-10-CM | POA: Diagnosis not present

## 2016-11-07 DIAGNOSIS — J301 Allergic rhinitis due to pollen: Secondary | ICD-10-CM | POA: Diagnosis not present

## 2016-11-09 ENCOUNTER — Ambulatory Visit: Payer: 59 | Admitting: Neurology

## 2016-11-10 DIAGNOSIS — J301 Allergic rhinitis due to pollen: Secondary | ICD-10-CM | POA: Diagnosis not present

## 2016-11-11 ENCOUNTER — Telehealth: Payer: Self-pay | Admitting: *Deleted

## 2016-11-11 DIAGNOSIS — J301 Allergic rhinitis due to pollen: Secondary | ICD-10-CM | POA: Diagnosis not present

## 2016-11-11 NOTE — Telephone Encounter (Signed)
Pt has been approved for Praluent until 11/04/2017.

## 2016-11-15 DIAGNOSIS — J301 Allergic rhinitis due to pollen: Secondary | ICD-10-CM | POA: Diagnosis not present

## 2016-11-21 DIAGNOSIS — J301 Allergic rhinitis due to pollen: Secondary | ICD-10-CM | POA: Diagnosis not present

## 2016-11-22 DIAGNOSIS — R350 Frequency of micturition: Secondary | ICD-10-CM | POA: Diagnosis not present

## 2016-11-22 DIAGNOSIS — R3915 Urgency of urination: Secondary | ICD-10-CM | POA: Diagnosis not present

## 2016-11-22 DIAGNOSIS — D400 Neoplasm of uncertain behavior of prostate: Secondary | ICD-10-CM | POA: Diagnosis not present

## 2016-11-23 DIAGNOSIS — N401 Enlarged prostate with lower urinary tract symptoms: Secondary | ICD-10-CM | POA: Diagnosis not present

## 2016-11-23 DIAGNOSIS — C61 Malignant neoplasm of prostate: Secondary | ICD-10-CM | POA: Diagnosis not present

## 2016-11-23 DIAGNOSIS — D400 Neoplasm of uncertain behavior of prostate: Secondary | ICD-10-CM | POA: Diagnosis not present

## 2016-11-24 DIAGNOSIS — J301 Allergic rhinitis due to pollen: Secondary | ICD-10-CM | POA: Diagnosis not present

## 2016-11-28 ENCOUNTER — Telehealth: Payer: Self-pay | Admitting: Cardiovascular Disease

## 2016-11-28 MED ORDER — ALIROCUMAB 150 MG/ML ~~LOC~~ SOPN: 1.0000 "pen " | PEN_INJECTOR | SUBCUTANEOUS | 3 refills | Status: DC

## 2016-11-28 NOTE — Telephone Encounter (Signed)
°*  STAT* If patient is at the pharmacy, call can be transferred to refill team.   1. Which medications need to be refilled? (please list name of each medication and dose if known)  Purulent  2. Which pharmacy/location (including street and city if local pharmacy) is medication to be sent to? [facility]  3. Do they need a 30 day or 90 day supply? 90 day

## 2016-11-28 NOTE — Telephone Encounter (Signed)
Refill sent in

## 2016-11-28 NOTE — Telephone Encounter (Signed)
Refill Request for Praluent.

## 2016-11-30 DIAGNOSIS — J320 Chronic maxillary sinusitis: Secondary | ICD-10-CM | POA: Diagnosis not present

## 2016-11-30 DIAGNOSIS — J301 Allergic rhinitis due to pollen: Secondary | ICD-10-CM | POA: Diagnosis not present

## 2016-11-30 DIAGNOSIS — H6123 Impacted cerumen, bilateral: Secondary | ICD-10-CM | POA: Diagnosis not present

## 2016-11-30 DIAGNOSIS — R42 Dizziness and giddiness: Secondary | ICD-10-CM | POA: Diagnosis not present

## 2016-12-07 DIAGNOSIS — J301 Allergic rhinitis due to pollen: Secondary | ICD-10-CM | POA: Diagnosis not present

## 2016-12-14 ENCOUNTER — Other Ambulatory Visit: Payer: Self-pay | Admitting: Cardiovascular Disease

## 2016-12-14 DIAGNOSIS — J301 Allergic rhinitis due to pollen: Secondary | ICD-10-CM | POA: Diagnosis not present

## 2016-12-28 DIAGNOSIS — J321 Chronic frontal sinusitis: Secondary | ICD-10-CM | POA: Diagnosis not present

## 2016-12-28 DIAGNOSIS — J320 Chronic maxillary sinusitis: Secondary | ICD-10-CM | POA: Diagnosis not present

## 2017-01-04 DIAGNOSIS — Z48813 Encounter for surgical aftercare following surgery on the respiratory system: Secondary | ICD-10-CM | POA: Diagnosis not present

## 2017-01-13 DIAGNOSIS — J301 Allergic rhinitis due to pollen: Secondary | ICD-10-CM | POA: Diagnosis not present

## 2017-01-17 ENCOUNTER — Encounter: Payer: Self-pay | Admitting: Neurology

## 2017-01-17 ENCOUNTER — Ambulatory Visit: Payer: 59 | Admitting: Neurology

## 2017-01-17 VITALS — BP 180/82 | HR 73 | Ht 73.0 in | Wt 245.5 lb

## 2017-01-17 DIAGNOSIS — R251 Tremor, unspecified: Secondary | ICD-10-CM | POA: Diagnosis not present

## 2017-01-17 DIAGNOSIS — R202 Paresthesia of skin: Secondary | ICD-10-CM | POA: Diagnosis not present

## 2017-01-17 MED ORDER — CLONAZEPAM 0.5 MG PO TABS: 0.5000 mg | ORAL_TABLET | Freq: Every day | ORAL | 4 refills | Status: DC

## 2017-01-17 NOTE — Patient Instructions (Signed)
   Taper off of the gabapentin to 300 mg twice a day for 1 week, then take one a day for one week, and then stop.  We will start clonazepam 0.5 mg daily, call for any dose adjustments.

## 2017-01-17 NOTE — Progress Notes (Signed)
Reason for visit: Tremor  [name] is an 62 y.o. male  History of present illness:  [name] is a 62 year old right-handed white male with a history of numbness in the feet, he has a chronic gait disorder, he denies any falls since last seen.  Prior EMG and nerve conduction study did not show evidence of a peripheral neuropathy, it is possible he may have a small fiber neuropathy.  He denies significant discomfort in the feet, however.  He does have low back pain, prior MRI evaluation of the back has not revealed severe spinal stenosis.  The patient is mainly concerned about his tremors that affect both hands and he may have a jaw tremor at times.  The tremor does affect his ability to perform tasks that require fine motor control.  He denies any family history of tremor.  The patient has been given a trial on gabapentin, this was not helpful for the tremor suppression.  He is on Eliquis, he cannot take primidone.  He was given a trial on Topamax but he could not tolerate the drug.  He is already on a beta-blocker medication.  He returns for an evaluation.  Past Medical History:  Diagnosis Date  . Abnormality of gait 09/03/2012  . Alcohol abuse   . Anxiety   . Arthritis   . Arthritis   . Esophageal stricture   . GERD (gastroesophageal reflux disease)   . Gout   . Hyperlipidemia   . Hypertension   . Internal hemorrhoids   . Lesion of ulnar nerve 09/03/2012   Right ulnar neuropathy  . Murmur, cardiac   . Palpitations   . Prostate cancer ([place]) 2016   treated with radioactive seed implant  . Seizures ([place])    pt states he had seizure 12/14  . Tubular adenoma of colon 03/2008    Past Surgical History:  Procedure Laterality Date  . CATARACT EXTRACTION W/PHACO Right 07/03/2014   Procedure: CATARACT EXTRACTION PHACO AND INTRAOCULAR LENS PLACEMENT (IOC);  Surgeon: Lyla Glassing, MD;  Location: [facility] ORS;  Service: Ophthalmology;  Laterality: Right;  lot # [id number] H [place]:  00:32.3 AP% 9.1 CDE: 2.92  . CIRCUMCISION    . COLONOSCOPY    . ELBOW SURGERY    . ELECTROPHYSIOLOGIC STUDY N/A 11/02/2015   Procedure: CARDIOVERSION;  Surgeon: Minna Merritts, MD;  Location: [facility] ORS;  Service: Cardiovascular;  Laterality: N/A;  . ESOPHAGOGASTRODUODENOSCOPY (EGD) WITH PROPOFOL N/A 05/17/2016   Procedure: ESOPHAGOGASTRODUODENOSCOPY (EGD) WITH PROPOFOL;  Surgeon: Lucilla Lame, MD;  Location: [facility] ENDOSCOPY;  Service: Endoscopy;  Laterality: N/A;  . EYE SURGERY    . HAND SURGERY Left   . SHOULDER SURGERY      Family History  Problem Relation Age of Onset  . Diabetes Father   . Kidney disease Father   . Lung cancer Father        smoker  . Hyperlipidemia Father   . Hypertension Father   . Lung cancer Mother        smoke  . Hypertension Mother   . Colon cancer Neg Hx   . Pancreatic cancer Neg Hx   . Stomach cancer Neg Hx     Social history:  reports that he has been smoking cigarettes.  He has smoked for the past 24.00 years. he has never used smokeless tobacco. He reports that he drinks about 8.4 oz of alcohol per week. He reports that he does not use drugs.    Allergies  Allergen Reactions  .  Crestor [Rosuvastatin] Other (See Comments)    Bloating, swelling of legs, fatty liver  . Dust Mite Extract   . Hydrocodone Bitartrate Er   . Lipitor [Atorvastatin] Other (See Comments)    Myalgias  . Topamax [Topiramate]     Cognitive clouding    Medications:  Prior to Admission medications   Medication Sig Start Date End Date Taking? Authorizing Provider  Alirocumab (PRALUENT) 150 MG/ML SOPN Inject 1 pen every 14 (fourteen) days into the skin. 11/28/16  Yes Gollan, Kathlene November, MD  BYSTOLIC 20 MG TABS TAKE 1 TABLET (20 MG TOTAL) BY MOUTH DAILY. 08/03/16  Yes Minna Merritts, MD  cloNIDine (CATAPRES) 0.1 MG tablet Take 1 tablet (0.1 mg total) by mouth 2 (two) times daily as needed. 10/10/16  Yes Minna Merritts, MD  colchicine 0.6 MG tablet Take 0.6 mg by mouth  as needed (gout). For gout flare ups   Yes [provider]  cyclobenzaprine (FLEXERIL) 10 MG tablet Take 1 tablet (10 mg total) by mouth 3 (three) times daily as needed for muscle spasms. 11/22/15  Yes Hagler, Jami L, PA-C  doxazosin (CARDURA) 8 MG tablet Take 1 tablet (8 mg total) by mouth 2 (two) times daily. 10/10/16  Yes Gollan, Kathlene November, MD  ELIQUIS 5 MG TABS tablet TAKE ONE TABLET BY MOUTH TWICE A DAY 10/11/16  Yes Gollan, Kathlene November, MD  gabapentin (NEURONTIN) 300 MG capsule Take 1 capsule (300 mg total) by mouth 3 (three) times daily. 07/06/16  Yes Kathrynn Ducking, MD  hydrOXYzine (ATARAX/VISTARIL) 50 MG tablet Take 50 mg by mouth at bedtime. Reported on 02/04/2015   Yes [provider]  levofloxacin (LEVAQUIN) 500 MG tablet Take 1 tablet (500 mg total) by mouth daily. 06/08/16  Yes Cook, Jayce G, DO  lisinopril (PRINIVIL,ZESTRIL) 40 MG tablet TAKE 1 TABLET (40 MG TOTAL) BY MOUTH DAILY. 12/14/16  Yes Gollan, Kathlene November, MD  milk thistle 175 MG tablet Take 350 mg by mouth daily.    Yes [provider]  Multiple Vitamin (MULTIVITAMIN) tablet Take 1 tablet by mouth daily. Centrum Plus -Take 1 daily   Yes [provider]  tamsulosin (FLOMAX) 0.4 MG CAPS capsule Take 0.4 mg by mouth daily.    Yes [provider]  traMADol (ULTRAM) 50 MG tablet Take 1 tablet (50 mg total) by mouth every 8 (eight) hours as needed. 08/08/16  Yes Cook, Jayce G, DO  triamcinolone cream (KENALOG) 0.1 % Apply 1 application topically daily as needed.  12/24/14  Yes [provider]  VOLTAREN 1 % GEL Apply 2 g topically as needed (knees).  04/02/13  Yes [provider]    ROS:  Out of a complete 14 system review of symptoms, the patient complains only of the following symptoms, and all other reviewed systems are negative.  Appetite change Eye redness Leg swelling, heart murmur Restless legs, insomnia, frequent waking Environmental allergies Frequency of  urination Back pain, neck pain Itching Bruising easily Tremors Anxiety, hyperactivity  Blood pressure (!) 180/82, pulse 73, height 6\' 1"  (1.854 m), weight 245 lb 8 oz (111.4 kg).  Physical Exam  General: The patient is alert and cooperative at the time of the examination.  The patient is moderately to markedly obese.  Skin: No significant peripheral edema is noted.   Neurologic Exam  Mental status: The patient is alert and oriented x 3 at the time of the examination. The patient has apparent normal recent and remote memory, with an apparently  normal attention span and concentration ability.   Cranial nerves: Facial symmetry is present. Speech is normal, no aphasia or dysarthria is noted. Extraocular movements are full. Visual fields are full.  Motor: The patient has good strength in all 4 extremities.  Sensory examination: Soft touch sensation is symmetric on the face, arms, and legs.  Coordination: The patient has good finger-nose-finger and heel-to-shin bilaterally.  The patient has intention tremors of finger-nose-finger bilaterally.  With drawing a spiral, some tremor is translated into handwriting.  Gait and station: The patient has a wide-based gait, tandem gait is unsteady.. Romberg is negative, but is unsteady. No drift is seen.  Reflexes: Deep tendon reflexes are symmetric.   MRI brain 04/18/2013:  IMPRESSION: Abnormal MRI scan the brain showing mild changes of chronic microvascular ischemia and generalized cerebral atrophy. There are incidental changes of mild paranasal sinusitis and mastoiditis. Overall no significant change compared with previous MRI scan dated 10/03/2012    Assessment/Plan:  1.  Essential tremor  2.  Numbness in the legs, gait disturbance  The patient will be given a trial on low-dose clonazepam taking 0.5 mg daily.  He will call for any dose adjustments.  He will be tapered off of the gabapentin.  He will follow-up in about 6  months.  Jill Alexanders MD 01/17/2017 11:56 AM  [facility] [address] [place], [place] [address]  Phone [phone] Fax [phone]

## 2017-01-17 NOTE — Progress Notes (Signed)
Faxed printed/signed rx clonazepam to [facility] at [phone]. Received fax confirmation.

## 2017-01-19 ENCOUNTER — Observation Stay
Admission: EM | Admit: 2017-01-19 | Discharge: 2017-01-20 | Disposition: A | Discharge status: Home / Self Care | Payer: 59 | Attending: Internal Medicine | Admitting: Internal Medicine

## 2017-01-19 ENCOUNTER — Other Ambulatory Visit: Payer: Self-pay

## 2017-01-19 ENCOUNTER — Encounter: Payer: Self-pay | Admitting: Emergency Medicine

## 2017-01-19 ENCOUNTER — Emergency Department: Payer: 59

## 2017-01-19 DIAGNOSIS — I2510 Atherosclerotic heart disease of native coronary artery without angina pectoris: Secondary | ICD-10-CM | POA: Insufficient documentation

## 2017-01-19 DIAGNOSIS — R778 Other specified abnormalities of plasma proteins: Secondary | ICD-10-CM | POA: Diagnosis present

## 2017-01-19 DIAGNOSIS — S199XXA Unspecified injury of neck, initial encounter: Secondary | ICD-10-CM | POA: Diagnosis not present

## 2017-01-19 DIAGNOSIS — S0012XA Contusion of left eyelid and periocular area, initial encounter: Secondary | ICD-10-CM | POA: Diagnosis not present

## 2017-01-19 DIAGNOSIS — F419 Anxiety disorder, unspecified: Secondary | ICD-10-CM | POA: Diagnosis not present

## 2017-01-19 DIAGNOSIS — Z888 Allergy status to other drugs, medicaments and biological substances status: Secondary | ICD-10-CM | POA: Insufficient documentation

## 2017-01-19 DIAGNOSIS — Z7901 Long term (current) use of anticoagulants: Secondary | ICD-10-CM | POA: Diagnosis not present

## 2017-01-19 DIAGNOSIS — Z79899 Other long term (current) drug therapy: Secondary | ICD-10-CM | POA: Insufficient documentation

## 2017-01-19 DIAGNOSIS — W19XXXA Unspecified fall, initial encounter: Secondary | ICD-10-CM | POA: Diagnosis not present

## 2017-01-19 DIAGNOSIS — R7989 Other specified abnormal findings of blood chemistry: Secondary | ICD-10-CM | POA: Diagnosis not present

## 2017-01-19 DIAGNOSIS — I25118 Atherosclerotic heart disease of native coronary artery with other forms of angina pectoris: Secondary | ICD-10-CM | POA: Diagnosis present

## 2017-01-19 DIAGNOSIS — I10 Essential (primary) hypertension: Secondary | ICD-10-CM | POA: Insufficient documentation

## 2017-01-19 DIAGNOSIS — K219 Gastro-esophageal reflux disease without esophagitis: Secondary | ICD-10-CM | POA: Insufficient documentation

## 2017-01-19 DIAGNOSIS — E785 Hyperlipidemia, unspecified: Secondary | ICD-10-CM | POA: Insufficient documentation

## 2017-01-19 DIAGNOSIS — R51 Headache: Secondary | ICD-10-CM | POA: Insufficient documentation

## 2017-01-19 DIAGNOSIS — R748 Abnormal levels of other serum enzymes: Secondary | ICD-10-CM | POA: Diagnosis not present

## 2017-01-19 DIAGNOSIS — W1830XA Fall on same level, unspecified, initial encounter: Secondary | ICD-10-CM | POA: Diagnosis not present

## 2017-01-19 DIAGNOSIS — I483 Typical atrial flutter: Secondary | ICD-10-CM | POA: Insufficient documentation

## 2017-01-19 DIAGNOSIS — Z8546 Personal history of malignant neoplasm of prostate: Secondary | ICD-10-CM | POA: Insufficient documentation

## 2017-01-19 DIAGNOSIS — F1010 Alcohol abuse, uncomplicated: Secondary | ICD-10-CM | POA: Diagnosis present

## 2017-01-19 DIAGNOSIS — J301 Allergic rhinitis due to pollen: Secondary | ICD-10-CM | POA: Diagnosis not present

## 2017-01-19 DIAGNOSIS — R55 Syncope and collapse: Principal | ICD-10-CM | POA: Diagnosis present

## 2017-01-19 DIAGNOSIS — S0990XA Unspecified injury of head, initial encounter: Secondary | ICD-10-CM | POA: Diagnosis not present

## 2017-01-19 LAB — CBC
HEMATOCRIT: 33.8 % — AB (ref 40.0–52.0)
Hemoglobin: 11.9 g/dL — ABNORMAL LOW (ref 13.0–18.0)
MCH: 40.2 pg — AB (ref 26.0–34.0)
MCHC: 35.2 g/dL (ref 32.0–36.0)
MCV: 114.1 fL — AB (ref 80.0–100.0)
PLATELETS: 70 10*3/uL — AB (ref 150–440)
RBC: 2.96 MIL/uL — AB (ref 4.40–5.90)
RDW: 14.8 % — ABNORMAL HIGH (ref 11.5–14.5)
WBC: 8 10*3/uL (ref 3.8–10.6)

## 2017-01-19 LAB — BASIC METABOLIC PANEL
Anion gap: 13 (ref 5–15)
BUN: 9 mg/dL (ref 6–20)
CHLORIDE: 105 mmol/L (ref 101–111)
CO2: 21 mmol/L — AB (ref 22–32)
CREATININE: 0.8 mg/dL (ref 0.61–1.24)
Calcium: 9 mg/dL (ref 8.9–10.3)
GFR calc non Af Amer: >60 mL/min (ref >60)
Glucose, Bld: 118 mg/dL — ABNORMAL HIGH (ref 65–99)
POTASSIUM: 4.2 mmol/L (ref 3.5–5.1)
Sodium: 139 mmol/L (ref 135–145)

## 2017-01-19 LAB — TROPONIN I: Troponin I: 0.06 ng/mL (ref <0.03)

## 2017-01-19 MED ORDER — OXYCODONE-ACETAMINOPHEN 5-325 MG PO TABS
ORAL_TABLET | ORAL | Status: AC
  Administered 2017-01-19: 1 via ORAL
  Filled 2017-01-19: qty 1

## 2017-01-19 MED ORDER — TAMSULOSIN HCL 0.4 MG PO CAPS
0.4000 mg | ORAL_CAPSULE | Freq: Every day | ORAL | Status: DC
  Administered 2017-01-19: 0.4 mg via ORAL
  Filled 2017-01-19: qty 1

## 2017-01-19 MED ORDER — THIAMINE HCL 100 MG/ML IJ SOLN: 100.0000 mg | Freq: Every day | INTRAMUSCULAR | Status: DC

## 2017-01-19 MED ORDER — LORAZEPAM 1 MG PO TABS
1.0000 mg | ORAL_TABLET | Freq: Four times a day (QID) | ORAL | Status: DC | PRN
  Administered 2017-01-20: 1 mg via ORAL
  Filled 2017-01-19: qty 1

## 2017-01-19 MED ORDER — LISINOPRIL 20 MG PO TABS
40.0000 mg | ORAL_TABLET | Freq: Every day | ORAL | Status: DC
  Administered 2017-01-20: 40 mg via ORAL
  Filled 2017-01-19: qty 2

## 2017-01-19 MED ORDER — HYDROXYZINE HCL 25 MG PO TABS
50.0000 mg | ORAL_TABLET | Freq: Every day | ORAL | Status: DC
  Administered 2017-01-19: 50 mg via ORAL
  Filled 2017-01-19: qty 2

## 2017-01-19 MED ORDER — ONDANSETRON HCL 4 MG/2ML IJ SOLN
4.0000 mg | Freq: Four times a day (QID) | INTRAMUSCULAR | Status: DC | PRN
Start: 2017-01-19 — End: 2017-01-20

## 2017-01-19 MED ORDER — ACETAMINOPHEN 650 MG RE SUPP: 650.0000 mg | Freq: Four times a day (QID) | RECTAL | Status: DC | PRN

## 2017-01-19 MED ORDER — ONDANSETRON HCL 4 MG PO TABS: 4.0000 mg | ORAL_TABLET | Freq: Four times a day (QID) | ORAL | Status: DC | PRN

## 2017-01-19 MED ORDER — FOLIC ACID 1 MG PO TABS
1.0000 mg | ORAL_TABLET | Freq: Every day | ORAL | Status: DC
  Administered 2017-01-20: 1 mg via ORAL
  Filled 2017-01-19: qty 1

## 2017-01-19 MED ORDER — OXYCODONE HCL 5 MG PO TABS
5.0000 mg | ORAL_TABLET | ORAL | Status: DC | PRN
  Administered 2017-01-19 – 2017-01-20 (×3): 5 mg via ORAL
  Filled 2017-01-19 (×3): qty 1

## 2017-01-19 MED ORDER — LORAZEPAM 2 MG/ML IJ SOLN
1.0000 mg | Freq: Four times a day (QID) | INTRAMUSCULAR | Status: DC | PRN
  Administered 2017-01-20: 1 mg via INTRAVENOUS
  Filled 2017-01-19: qty 1

## 2017-01-19 MED ORDER — ACETAMINOPHEN 500 MG PO TABS
1000.0000 mg | ORAL_TABLET | Freq: Once | ORAL | Status: AC
  Administered 2017-01-19: 1000 mg via ORAL
  Filled 2017-01-19: qty 2

## 2017-01-19 MED ORDER — OXYCODONE-ACETAMINOPHEN 5-325 MG PO TABS
1.0000 | ORAL_TABLET | Freq: Once | ORAL | Status: AC
  Administered 2017-01-19: 1 via ORAL
  Filled 2017-01-19: qty 1

## 2017-01-19 MED ORDER — NEBIVOLOL HCL 5 MG PO TABS
20.0000 mg | ORAL_TABLET | Freq: Every day | ORAL | Status: DC
  Administered 2017-01-20: 20 mg via ORAL
  Filled 2017-01-19: qty 4

## 2017-01-19 MED ORDER — VITAMIN B-1 100 MG PO TABS
100.0000 mg | ORAL_TABLET | Freq: Every day | ORAL | Status: DC
  Administered 2017-01-20: 100 mg via ORAL
  Filled 2017-01-19: qty 1

## 2017-01-19 MED ORDER — APIXABAN 5 MG PO TABS
5.0000 mg | ORAL_TABLET | Freq: Two times a day (BID) | ORAL | Status: DC
  Administered 2017-01-19: 5 mg via ORAL
  Filled 2017-01-19: qty 1

## 2017-01-19 MED ORDER — ACETAMINOPHEN 325 MG PO TABS: 650.0000 mg | ORAL_TABLET | Freq: Four times a day (QID) | ORAL | Status: DC | PRN

## 2017-01-19 MED ORDER — ADULT MULTIVITAMIN W/MINERALS CH
1.0000 | ORAL_TABLET | Freq: Every day | ORAL | Status: DC
  Administered 2017-01-20: 1 via ORAL
  Filled 2017-01-19: qty 1

## 2017-01-19 NOTE — ED Triage Notes (Signed)
Pt arrived via ems from home after falling. Pt reports starting new medication (Klonoin 0.5mg ) and then having 3 beers. Pt states he remember wanting to go upstairs but only remembers waking up on the floor. Wife called ems. Upon arrival pt alert and oriented x 4, left eye swollen and bruised.

## 2017-01-19 NOTE — H&P (Signed)
[place] [place] NAME: Peter [name]    MR#:  [id number]  DATE OF BIRTH:  1956/01/06  DATE OF ADMISSION:  01/19/2017  PRIMARY CARE PHYSICIAN: [name], MD   REQUESTING/REFERRING PHYSICIAN: Cinda Quest, MD  CHIEF COMPLAINT:   Chief Complaint  Patient presents with  . Fall  . Loss of Consciousness    HISTORY OF PRESENT ILLNESS:  Peter [name]  is a 62 y.o. male who presents with syncopal episode.  Patient states that he recently started taking Klonopin as prescribed by his neurologist for tremor.  He took his Klonopin this afternoon, and then had 2 beers.  He was going to get a third beer and when he stood up had a syncopal episode.  He was alone at the time and this episode was unwitnessed.  He woke up on his kitchen floor with apparently no significant postictal period.  No prior history of seizure disorder.  He did not lose control of his bowel or bladder.  Here in the ED he has a large hematoma over his left eye without any evidence of bony orbital, globe or intracranial abnormality.  His troponin was mildly elevated 0.06.  Hospitalist were called for admission and further evaluation  PAST MEDICAL HISTORY:   Past Medical History:  Diagnosis Date  . Abnormality of gait 09/03/2012  . Alcohol abuse   . Anxiety   . Arthritis   . Arthritis   . Esophageal stricture   . GERD (gastroesophageal reflux disease)   . Gout   . Hyperlipidemia   . Hypertension   . Internal hemorrhoids   . Lesion of ulnar nerve 09/03/2012   Right ulnar neuropathy  . Murmur, cardiac   . Palpitations   . Prostate cancer ([place]) 2016   treated with radioactive seed implant  . Seizures ([place])    pt states he had seizure 12/14  . Tubular adenoma of colon 03/2008    PAST SURGICAL HISTORY:   Past Surgical History:  Procedure Laterality Date  . CATARACT EXTRACTION W/PHACO Right 07/03/2014   Procedure: CATARACT EXTRACTION PHACO AND INTRAOCULAR LENS  PLACEMENT (IOC);  Surgeon: Lyla Glassing, MD;  Location: [facility] ORS;  Service: Ophthalmology;  Laterality: Right;  lot # [id number] H [place]: 00:32.3 AP% 9.1 CDE: 2.92  . CIRCUMCISION    . COLONOSCOPY    . ELBOW SURGERY    . ELECTROPHYSIOLOGIC STUDY N/A 11/02/2015   Procedure: CARDIOVERSION;  Surgeon: Minna Merritts, MD;  Location: [facility] ORS;  Service: Cardiovascular;  Laterality: N/A;  . ESOPHAGOGASTRODUODENOSCOPY (EGD) WITH PROPOFOL N/A 05/17/2016   Procedure: ESOPHAGOGASTRODUODENOSCOPY (EGD) WITH PROPOFOL;  Surgeon: Lucilla Lame, MD;  Location: [facility] ENDOSCOPY;  Service: Endoscopy;  Laterality: N/A;  . EYE SURGERY    . HAND SURGERY Left   . SHOULDER SURGERY      SOCIAL HISTORY:   Social History   Tobacco Use  . Smoking status: Current Every Day Smoker    Years: 24.00    Types: Cigarettes  . Smokeless tobacco: Never Used  . Tobacco comment: down to 2 ciggs a day.  Substance Use Topics  . Alcohol use: Yes    Alcohol/week: 8.4 oz    Types: 14 Cans of beer per week    Comment: 4 beers per night    FAMILY HISTORY:   Family History  Problem Relation Age of Onset  . Diabetes Father   . Kidney disease Father   . Lung cancer Father  smoker  . Hyperlipidemia Father   . Hypertension Father   . Lung cancer Mother        smoke  . Hypertension Mother   . Colon cancer Neg Hx   . Pancreatic cancer Neg Hx   . Stomach cancer Neg Hx     DRUG ALLERGIES:   Allergies  Allergen Reactions  . Crestor [Rosuvastatin] Other (See Comments)    Bloating, swelling of legs, fatty liver  . Dust Mite Extract   . Hydrocodone Bitartrate Er   . Lipitor [Atorvastatin] Other (See Comments)    Myalgias  . Topamax [Topiramate]     Cognitive clouding    MEDICATIONS AT HOME:   Prior to Admission medications   Medication Sig Start Date End Date Taking? Authorizing Provider  Alirocumab (PRALUENT) 150 MG/ML SOPN Inject 1 pen every 14 (fourteen) days into the skin. 11/28/16  Yes Gollan, Kathlene November, MD  BYSTOLIC 20 MG TABS TAKE 1 TABLET (20 MG TOTAL) BY MOUTH DAILY. 08/03/16  Yes Gollan, Kathlene November, MD  clonazePAM (KLONOPIN) 0.5 MG tablet Take 1 tablet (0.5 mg total) by mouth daily. 01/17/17  Yes Kathrynn Ducking, MD  colchicine 0.6 MG tablet Take 0.6 mg by mouth as needed (gout). For gout flare ups   Yes [provider]  ELIQUIS 5 MG TABS tablet TAKE ONE TABLET BY MOUTH TWICE A DAY 10/11/16  Yes Gollan, Kathlene November, MD  hydrOXYzine (ATARAX/VISTARIL) 50 MG tablet Take 50 mg by mouth at bedtime. Reported on 02/04/2015   Yes [provider]  lisinopril (PRINIVIL,ZESTRIL) 40 MG tablet TAKE 1 TABLET (40 MG TOTAL) BY MOUTH DAILY. 12/14/16  Yes Gollan, Kathlene November, MD  milk thistle 175 MG tablet Take 350 mg by mouth daily.    Yes [provider]  Multiple Vitamin (MULTIVITAMIN) tablet Take 1 tablet by mouth daily. Centrum Plus -Take 1 daily   Yes [provider]  tamsulosin (FLOMAX) 0.4 MG CAPS capsule Take 0.4 mg by mouth daily.    Yes [provider]  triamcinolone cream (KENALOG) 0.1 % Apply 1 application topically daily as needed.  12/24/14  Yes [provider]  VOLTAREN 1 % GEL Apply 2 g topically as needed (knees).  04/02/13  Yes [provider]  cloNIDine (CATAPRES) 0.1 MG tablet Take 1 tablet (0.1 mg total) by mouth 2 (two) times daily as needed. Patient not taking: Reported on 01/19/2017 10/10/16   Minna Merritts, MD    REVIEW OF SYSTEMS:  Review of Systems  Constitutional: Negative for chills, fever, malaise/fatigue and weight loss.  HENT: Negative for ear pain, hearing loss and tinnitus.   Eyes: Negative for blurred vision, double vision, pain and redness.       Left periorbital pain and swelling  Respiratory: Negative for cough, hemoptysis and shortness of breath.   Cardiovascular: Negative for chest pain, palpitations, orthopnea and leg swelling.  Gastrointestinal: Negative for abdominal pain, constipation, diarrhea, nausea  and vomiting.  Genitourinary: Negative for dysuria, frequency and hematuria.  Musculoskeletal: Negative for back pain, joint pain and neck pain.  Skin:       No acne, rash, or lesions  Neurological: Positive for loss of consciousness. Negative for dizziness, tremors, focal weakness and weakness.  Endo/Heme/Allergies: Negative for polydipsia. Does not bruise/bleed easily.  Psychiatric/Behavioral: Negative for depression. The patient is not nervous/anxious and does not have insomnia.      VITAL SIGNS:   Vitals:   01/19/17 1836 01/19/17 1837 01/19/17 1930 01/19/17 2044  BP: (!) 186/92  (!) 158/91 139/73  Pulse: 73  78 87  Resp: 17  20 20   Temp: 98.1 F (36.7 C)     TempSrc: Oral     SpO2: 98%  93% 95%  Weight:  111.1 kg (245 lb)    Height:  6' (1.829 m)     Wt Readings from Last 3 Encounters:  01/19/17 111.1 kg (245 lb)  01/17/17 111.4 kg (245 lb 8 oz)  10/10/16 111.6 kg (246 lb)    PHYSICAL EXAMINATION:  Physical Exam  Vitals reviewed. Constitutional: He is oriented to person, place, and time. He appears well-developed and well-nourished. No distress.  HENT:  Head: Normocephalic.  Mouth/Throat: Oropharynx is clear and moist.  Large hematoma left eyelid and left periorbital region  Eyes: Conjunctivae and EOM are normal. Pupils are equal, round, and reactive to light. No scleral icterus.  Neck: Normal range of motion. Neck supple. No JVD present. No thyromegaly present.  Cardiovascular: Normal rate, regular rhythm and intact distal pulses. Exam reveals no gallop and no friction rub.  No murmur heard. Respiratory: Effort normal and breath sounds normal. No respiratory distress. He has no wheezes. He has no rales.  GI: Soft. Bowel sounds are normal. He exhibits no distension. There is no tenderness.  Musculoskeletal: Normal range of motion. He exhibits no edema.  No arthritis, no gout  Lymphadenopathy:    He has no cervical adenopathy.  Neurological: He is alert and oriented  to person, place, and time. No cranial nerve deficit.  No dysarthria, no aphasia  Skin: Skin is warm and dry. No rash noted. No erythema.  Psychiatric: He has a normal mood and affect. His behavior is normal. Judgment and thought content normal.    LABORATORY PANEL:   CBC Recent Labs  Lab 01/19/17 1838  WBC 8.0  HGB 11.9*  HCT 33.8*  PLT 70*   ------------------------------------------------------------------------------------------------------------------  Chemistries  Recent Labs  Lab 01/19/17 1838  NA 139  K 4.2  CL 105  CO2 21*  GLUCOSE 118*  BUN 9  CREATININE 0.80  CALCIUM 9.0   ------------------------------------------------------------------------------------------------------------------  Cardiac Enzymes Recent Labs  Lab 01/19/17 1840  TROPONINI 0.06*   ------------------------------------------------------------------------------------------------------------------  RADIOLOGY:  Ct Head Wo Contrast  Result Date: 01/19/2017 CLINICAL DATA:  Trauma. EXAM: CT HEAD WITHOUT CONTRAST CT CERVICAL SPINE WITHOUT CONTRAST TECHNIQUE: Multidetector CT imaging of the head and cervical spine was performed following the standard protocol without intravenous contrast. Multiplanar CT image reconstructions of the cervical spine were also generated. COMPARISON:  Head CT dated 12/22/2012. FINDINGS: CT HEAD FINDINGS Brain: Generalized age related parenchymal atrophy with commensurate dilatation of the ventricles and sulci. Mild chronic small vessel ischemic change within the deep periventricular white matter regions. No mass, hemorrhage, edema or other evidence of acute parenchymal abnormality. No extra-axial hemorrhage. Vascular: There are chronic calcified atherosclerotic changes of the large vessels at the skull base. No unexpected hyperdense vessel. Skull: Normal. Negative for fracture or focal lesion. Sinuses/Orbits: Mucosal thickening and/or fluid within the right maxillary  sinus, of uncertain age. Large soft tissue edema/hematoma overlying the left orbit and left lower frontal bone. No underlying fracture seen. Other: None. CT CERVICAL SPINE FINDINGS Alignment: Mild scoliosis which may be accentuated by patient positioning. No evidence of acute vertebral body subluxation. Skull base and vertebrae: No fracture line or displaced fracture fragment identified. Facet joints appear intact and normally aligned throughout. Soft tissues and spinal canal: No prevertebral fluid or swelling. No visible canal hematoma. Disc levels:  Disc desiccations within the lower cervical spine, with associated disc space narrowings and mild osseous spurring. No more than mild central canal stenosis at any level. Upper chest: Negative. Other: Carotid atherosclerosis. IMPRESSION: 1. Large soft tissue edema/hematoma overlying the left orbit and lower left frontal bone. No underlying fracture or dislocation seen. Left orbital globe appears grossly intact and normal in configuration. No retro-orbital hemorrhage or edema. 2. No acute intracranial abnormality. No intracranial hemorrhage or edema. No skull fracture. 3. No fracture or acute subluxation within the cervical spine. Scoliosis and mild degenerative change. 4. Carotid atherosclerosis. Electronically Signed   By: Franki Cabot M.D.   On: 01/19/2017 19:21   Ct Cervical Spine Wo Contrast  Result Date: 01/19/2017 CLINICAL DATA:  Trauma. EXAM: CT HEAD WITHOUT CONTRAST CT CERVICAL SPINE WITHOUT CONTRAST TECHNIQUE: Multidetector CT imaging of the head and cervical spine was performed following the standard protocol without intravenous contrast. Multiplanar CT image reconstructions of the cervical spine were also generated. COMPARISON:  Head CT dated 12/22/2012. FINDINGS: CT HEAD FINDINGS Brain: Generalized age related parenchymal atrophy with commensurate dilatation of the ventricles and sulci. Mild chronic small vessel ischemic change within the deep  periventricular white matter regions. No mass, hemorrhage, edema or other evidence of acute parenchymal abnormality. No extra-axial hemorrhage. Vascular: There are chronic calcified atherosclerotic changes of the large vessels at the skull base. No unexpected hyperdense vessel. Skull: Normal. Negative for fracture or focal lesion. Sinuses/Orbits: Mucosal thickening and/or fluid within the right maxillary sinus, of uncertain age. Large soft tissue edema/hematoma overlying the left orbit and left lower frontal bone. No underlying fracture seen. Other: None. CT CERVICAL SPINE FINDINGS Alignment: Mild scoliosis which may be accentuated by patient positioning. No evidence of acute vertebral body subluxation. Skull base and vertebrae: No fracture line or displaced fracture fragment identified. Facet joints appear intact and normally aligned throughout. Soft tissues and spinal canal: No prevertebral fluid or swelling. No visible canal hematoma. Disc levels: Disc desiccations within the lower cervical spine, with associated disc space narrowings and mild osseous spurring. No more than mild central canal stenosis at any level. Upper chest: Negative. Other: Carotid atherosclerosis. IMPRESSION: 1. Large soft tissue edema/hematoma overlying the left orbit and lower left frontal bone. No underlying fracture or dislocation seen. Left orbital globe appears grossly intact and normal in configuration. No retro-orbital hemorrhage or edema. 2. No acute intracranial abnormality. No intracranial hemorrhage or edema. No skull fracture. 3. No fracture or acute subluxation within the cervical spine. Scoliosis and mild degenerative change. 4. Carotid atherosclerosis. Electronically Signed   By: Franki Cabot M.D.   On: 01/19/2017 19:21    EKG:   Orders placed or performed during the hospital encounter of 01/19/17  . ED EKG  . ED EKG  . EKG 12-Lead  . EKG 12-Lead    IMPRESSION AND PLAN:  Principal Problem:   Syncope -strong  suspicion that this is a reaction of benzodiazepine and alcohol, but we will trend his cardiac enzymes tonight and perform other workup as below Active Problems:   Elevated troponin -cycle cardiac enzymes, get an echocardiogram and a cardiology consult   Essential hypertension -continue home meds   CAD (coronary artery disease) -continue home medications, other workup as above   Anxiety -home dose hydroxyzine as anxiolytic, hold any scheduled benzos for now   Alcohol abuse -CIWA protocol  All the records are reviewed and case discussed with ED provider. Management plans discussed with the patient and/or family.  DVT PROPHYLAXIS: Systemic anticoagulation  GI PROPHYLAXIS: None  ADMISSION STATUS: Observation  CODE STATUS: Full Code Status History    Date Active Date Inactive Code Status Order ID Comments User Context   05/16/2016 21:51 05/17/2016 16:54 Full Code [id number]  Henreitta Leber, MD Inpatient      TOTAL TIME TAKING CARE OF THIS PATIENT: 40 minutes.   Peter [name] [place] 01/19/2017, 9:00 PM  [employer]  [phone]  CC: Primary care physician; [name], MD  Note:  This document was prepared using Dragon voice recognition software and may include unintentional dictation errors.

## 2017-01-19 NOTE — ED Notes (Signed)
Attempted report; was told nurse would call me back.

## 2017-01-19 NOTE — ED Provider Notes (Signed)
[facility] Emergency Department Provider Note   ____________________________________________   First MD Initiated Contact with Patient 01/19/17 1912     (approximate)  I have reviewed the triage vital signs and the nursing notes.   HISTORY  Chief Complaint Fall and Loss of Consciousness    HPI Peter [name] is a 62 y.o. male Who comes in after falling at home. He had 3 beers and woke up on the floor.he takes eliquis The patient is currently in CT scan. on return from CT scan patient reports he started new medication called Klonopin for his tremors and then drank 3 beers because he is like he likes beers and work up on the kitchen floor. He is awake alert oriented 3 and looks otherwise well. He does have a bad headache.  Past Medical History:  Diagnosis Date  . Abnormality of gait 09/03/2012  . Alcohol abuse   . Anxiety   . Arthritis   . Arthritis   . Esophageal stricture   . GERD (gastroesophageal reflux disease)   . Gout   . Hyperlipidemia   . Hypertension   . Internal hemorrhoids   . Lesion of ulnar nerve 09/03/2012   Right ulnar neuropathy  . Murmur, cardiac   . Palpitations   . Prostate cancer ([place]) 2016   treated with radioactive seed implant  . Seizures ([place])    pt states he had seizure 12/14  . Tubular adenoma of colon 03/2008    Patient Active Problem List   Diagnosis Date Noted  . Sinusitis 06/08/2016  . Tremor 03/23/2016  . Paresthesia 03/23/2016  . Anxiety 02/23/2016  . Typical atrial flutter ([place]) 09/21/2015  . CAD (coronary artery disease) 05/06/2015  . Aortic atherosclerosis ([place]) 03/20/2015  . History of alcohol abuse 02/04/2015  . Alcohol induced fatty liver 02/04/2015  . Obesity (BMI 30.0-34.9) 02/04/2015  . Back pain 02/04/2015  . History of prostate cancer 02/04/2015  . Chronic venous insufficiency 10/12/2012  . Lesion of ulnar nerve 09/03/2012  . Tobacco abuse 04/05/2011  . Hyperlipidemia 01/21/2010  .  Essential hypertension 12/16/2009  . Premature beats 12/16/2009    Past Surgical History:  Procedure Laterality Date  . CATARACT EXTRACTION W/PHACO Right 07/03/2014   Procedure: CATARACT EXTRACTION PHACO AND INTRAOCULAR LENS PLACEMENT (IOC);  Surgeon: Lyla Glassing, MD;  Location: [facility] ORS;  Service: Ophthalmology;  Laterality: Right;  lot # [id number] [place]: 00:32.3 AP% 9.1 CDE: 2.92  . CIRCUMCISION    . COLONOSCOPY    . ELBOW SURGERY    . ELECTROPHYSIOLOGIC STUDY N/A 11/02/2015   Procedure: CARDIOVERSION;  Surgeon: Minna Merritts, MD;  Location: [facility] ORS;  Service: Cardiovascular;  Laterality: N/A;  . ESOPHAGOGASTRODUODENOSCOPY (EGD) WITH PROPOFOL N/A 05/17/2016   Procedure: ESOPHAGOGASTRODUODENOSCOPY (EGD) WITH PROPOFOL;  Surgeon: Lucilla Lame, MD;  Location: [facility] ENDOSCOPY;  Service: Endoscopy;  Laterality: N/A;  . EYE SURGERY    . HAND SURGERY Left   . SHOULDER SURGERY      Prior to Admission medications   Medication Sig Start Date End Date Taking? Authorizing Provider  Alirocumab (PRALUENT) 150 MG/ML SOPN Inject 1 pen every 14 (fourteen) days into the skin. 11/28/16   Gollan, Kathlene November, MD  BYSTOLIC 20 MG TABS TAKE 1 TABLET (20 MG TOTAL) BY MOUTH DAILY. 08/03/16   Minna Merritts, MD  clonazePAM (KLONOPIN) 0.5 MG tablet Take 1 tablet (0.5 mg total) by mouth daily. 01/17/17   Kathrynn Ducking, MD  cloNIDine (CATAPRES) 0.1 MG tablet Take 1  tablet (0.1 mg total) by mouth 2 (two) times daily as needed. 10/10/16   Minna Merritts, MD  colchicine 0.6 MG tablet Take 0.6 mg by mouth as needed (gout). For gout flare ups    [provider]  cyclobenzaprine (FLEXERIL) 10 MG tablet Take 1 tablet (10 mg total) by mouth 3 (three) times daily as needed for muscle spasms. 11/22/15   Hagler, Jami L, PA-C  doxazosin (CARDURA) 8 MG tablet Take 1 tablet (8 mg total) by mouth 2 (two) times daily. 10/10/16   Minna Merritts, MD  ELIQUIS 5 MG TABS tablet TAKE ONE TABLET BY MOUTH TWICE A DAY  10/11/16   Minna Merritts, MD  hydrOXYzine (ATARAX/VISTARIL) 50 MG tablet Take 50 mg by mouth at bedtime. Reported on 02/04/2015    [provider]  levofloxacin (LEVAQUIN) 500 MG tablet Take 1 tablet (500 mg total) by mouth daily. 06/08/16   Coral Spikes, DO  lisinopril (PRINIVIL,ZESTRIL) 40 MG tablet TAKE 1 TABLET (40 MG TOTAL) BY MOUTH DAILY. 12/14/16   Minna Merritts, MD  milk thistle 175 MG tablet Take 350 mg by mouth daily.     [provider]  Multiple Vitamin (MULTIVITAMIN) tablet Take 1 tablet by mouth daily. Centrum Plus -Take 1 daily    [provider]  tamsulosin (FLOMAX) 0.4 MG CAPS capsule Take 0.4 mg by mouth daily.     [provider]  traMADol (ULTRAM) 50 MG tablet Take 1 tablet (50 mg total) by mouth every 8 (eight) hours as needed. 08/08/16   Coral Spikes, DO  triamcinolone cream (KENALOG) 0.1 % Apply 1 application topically daily as needed.  12/24/14   [provider]  VOLTAREN 1 % GEL Apply 2 g topically as needed (knees).  04/02/13   [provider]    Allergies Crestor [rosuvastatin]; Dust mite extract; Hydrocodone bitartrate er; Lipitor [atorvastatin]; and Topamax [topiramate]  Family History  Problem Relation Age of Onset  . Diabetes Father   . Kidney disease Father   . Lung cancer Father        smoker  . Hyperlipidemia Father   . Hypertension Father   . Lung cancer Mother        smoke  . Hypertension Mother   . Colon cancer Neg Hx   . Pancreatic cancer Neg Hx   . Stomach cancer Neg Hx     Social History Social History   Tobacco Use  . Smoking status: Current Every Day Smoker    Years: 24.00    Types: Cigarettes  . Smokeless tobacco: Never Used  . Tobacco comment: down to 2 ciggs a day.  Substance Use Topics  . Alcohol use: Yes    Alcohol/week: 8.4 oz    Types: 14 Cans of beer per week    Comment: 4 beers per night  . Drug use: No    Comment: 1970's occ.     Review of  Systems  Constitutional: No fever/chills Eyes: No visual changes. ENT: No sore throat. Cardiovascular: Denies chest pain. Respiratory: Denies shortness of breath. Gastrointestinal: No abdominal pain.  No nausea, no vomiting.  No diarrhea.  No constipation. Genitourinary: Negative for dysuria. Musculoskeletal: Negative for back pain. Skin: Negative for rash. Neurological: Negative for focal weakness   ____________________________________________   PHYSICAL EXAM:  VITAL SIGNS: ED Triage Vitals  Enc Vitals Group     BP 01/19/17 1836 (!) 186/92     Pulse Rate 01/19/17 1836 73  Resp 01/19/17 1836 17     Temp 01/19/17 1836 98.1 F (36.7 C)     Temp Source 01/19/17 1836 Oral     SpO2 01/19/17 1836 98 %     Weight 01/19/17 1837 245 lb (111.1 kg)     Height 01/19/17 1837 6' (1.829 m)     Head Circumference --      Peak Flow --      Pain Score 01/19/17 1836 4     Pain Loc --      Pain Edu? --      Excl. in [place]? --     Constitutional: Alert and oriented. Well appearing and in no acute distress. Eyes: Conjunctivae are normal.  Head: patient has a large hematoma around the left eye. The eye itself looks normal pupils reactive and round conjunctivae was normalnormal range of motion of the eyes Nose: No congestion/rhinnorhea. Mouth/Throat: Mucous membranes are moist.  Oropharynx non-erythematous. Neck: No stridor.Cardiovascular: Normal rate, regular rhythm. Grossly normal heart sounds.  Good peripheral circulation. Respiratory: Normal respiratory effort.  No retractions. Lungs CTAB. Gastrointestinal: Soft and nontender. No distention. No abdominal bruits. No CVA tenderness. Musculoskeletal: No lower extremity tenderness nor edema.  No joint effusions. Neurologic:  Normal speech and language. No gross focal neurologic deficits are appreciated. No gait instability. Skin:  Skin is warm, dry and intact. No rash noted. Psychiatric: Mood and affect are normal. Speech and behavior are  normal.  ____________________________________________   LABS (all labs ordered are listed, but only abnormal results are displayed)  Labs Reviewed  BASIC METABOLIC PANEL - Abnormal; Notable for the following components:      Result Value   CO2 21 (*)    Glucose, Bld 118 (*)    All other components within normal limits  CBC - Abnormal; Notable for the following components:   RBC 2.96 (*)    Hemoglobin 11.9 (*)    HCT 33.8 (*)    MCV 114.1 (*)    MCH 40.2 (*)    RDW 14.8 (*)    Platelets 70 (*)    All other components within normal limits  TROPONIN I - Abnormal; Notable for the following components:   Troponin I 0.06 (*)    All other components within normal limits  URINALYSIS, COMPLETE (UACMP) WITH MICROSCOPIC  TROPONIN I   ____________________________________________  EKG  EKG read and interpreted by me shows normal sinus rhythm rate of 76 left axis no acute ST-T wave changes there are 2 PVCs ____________________________________________  RADIOLOGY CT of the head and neck was read as no acute disease except for hematoma in the soft tissue around the orbit ____________________________________________   PROCEDURES  Procedure(s) performed:   Procedures  Critical Care performed:   ____________________________________________   INITIAL IMPRESSION / ASSESSMENT AND PLAN / ED COURSE  his troponin returns elevated. This is somewhat surprising. He has a normal GFR and a history of a negative troponin in the past. We will admit him just to be safe.     ____________________________________________   FINAL CLINICAL IMPRESSION(S) / ED DIAGNOSES  Final diagnoses:  Syncope and collapse  Elevated troponin     ED Discharge Orders    None       Note:  This document was prepared using Dragon voice recognition software and may include unintentional dictation errors.    Nena Polio, MD 01/19/17 [phone]

## 2017-01-20 ENCOUNTER — Observation Stay (HOSPITAL_BASED_OUTPATIENT_CLINIC_OR_DEPARTMENT_OTHER)
Admit: 2017-01-20 | Discharge: 2017-01-20 | Disposition: A | Discharge status: Home / Self Care | Payer: 59 | Attending: Internal Medicine | Admitting: Internal Medicine

## 2017-01-20 ENCOUNTER — Telehealth: Payer: Self-pay

## 2017-01-20 DIAGNOSIS — R55 Syncope and collapse: Secondary | ICD-10-CM | POA: Diagnosis not present

## 2017-01-20 DIAGNOSIS — Z79899 Other long term (current) drug therapy: Secondary | ICD-10-CM

## 2017-01-20 DIAGNOSIS — R748 Abnormal levels of other serum enzymes: Secondary | ICD-10-CM | POA: Diagnosis not present

## 2017-01-20 LAB — BASIC METABOLIC PANEL
Anion gap: 10 (ref 5–15)
BUN: 11 mg/dL (ref 6–20)
CALCIUM: 8.6 mg/dL — AB (ref 8.9–10.3)
CO2: 23 mmol/L (ref 22–32)
CREATININE: 1.05 mg/dL (ref 0.61–1.24)
Chloride: 107 mmol/L (ref 101–111)
GFR calc non Af Amer: >60 mL/min (ref >60)
Glucose, Bld: 135 mg/dL — ABNORMAL HIGH (ref 65–99)
Potassium: 4.1 mmol/L (ref 3.5–5.1)
SODIUM: 140 mmol/L (ref 135–145)

## 2017-01-20 LAB — TROPONIN I
TROPONIN I: 0.08 ng/mL — AB (ref <0.03)
TROPONIN I: 0.06 ng/mL — AB (ref <0.03)
Troponin I: 0.05 ng/mL (ref <0.03)

## 2017-01-20 LAB — CK: Total CK: 423 U/L — ABNORMAL HIGH (ref 49–397)

## 2017-01-20 LAB — CBC
HCT: 27.3 % — ABNORMAL LOW (ref 40.0–52.0)
Hemoglobin: 9.7 g/dL — ABNORMAL LOW (ref 13.0–18.0)
MCH: 40.6 pg — AB (ref 26.0–34.0)
MCHC: 35.4 g/dL (ref 32.0–36.0)
MCV: 114.5 fL — AB (ref 80.0–100.0)
Platelets: 52 10*3/uL — ABNORMAL LOW (ref 150–440)
RBC: 2.39 MIL/uL — ABNORMAL LOW (ref 4.40–5.90)
RDW: 14.9 % — ABNORMAL HIGH (ref 11.5–14.5)
WBC: 4.8 10*3/uL (ref 3.8–10.6)

## 2017-01-20 LAB — ECHOCARDIOGRAM COMPLETE
Height: 72 in
Weight: 3920 oz

## 2017-01-20 MED ORDER — APIXABAN 5 MG PO TABS: 5.0000 mg | ORAL_TABLET | Freq: Two times a day (BID) | ORAL | 2 refills | Status: DC

## 2017-01-20 NOTE — Progress Notes (Signed)
Chaplain received and order to visit with pt in room 237. Chaplain provided information for an advanced directive.    01/20/17 0905  Clinical Encounter Type  Visited With Patient  Visit Type Initial  Referral From Nurse  Consult/Referral To Chaplain  Spiritual Encounters  Spiritual Needs Other (Comment)

## 2017-01-20 NOTE — Telephone Encounter (Signed)
Copied from [place] [id number]. Topic: Appointment Scheduling - Scheduling Inquiry for Clinic >> Jan 20, 2017  8:58 AM Clack, Laban Emperor wrote: Reason for CRM: Julisa from [place]. States pt needs a hosp f/u in 4 days with Dr. Caryl Bis. He does not have anything, can you please check with provider to see if he can be worked in.

## 2017-01-20 NOTE — Discharge Instructions (Signed)
Syncope Syncope is when you temporarily lose consciousness. Syncope may also be called fainting or passing out. It is caused by a sudden decrease in blood flow to the brain. Even though most causes of syncope are not dangerous, syncope can be a sign of a serious medical problem. Signs that you may be about to faint include:  Feeling dizzy or light-headed.  Feeling nauseous.  Seeing all white or all black in your field of vision.  Having cold, clammy skin.  If you fainted, get medical help right away.Call your local emergency services (911 in the [place] Do not drive yourself to the hospital. Follow these instructions at home: Pay attention to any changes in your symptoms. Take these actions to help with your condition:  Have someone stay with you until you feel stable.  Do not drive, use machinery, or play sports until your health care provider says it is okay.  Keep all follow-up visits as told by your health care provider. This is important.  If you start to feel like you might faint, lie down right away and raise (elevate) your feet above the level of your heart. Breathe deeply and steadily. Wait until all of the symptoms have passed.  Drink enough fluid to keep your urine clear or pale yellow.  If you are taking blood pressure or heart medicine, get up slowly and take several minutes to sit and then stand. This can reduce dizziness.  Take over-the-counter and prescription medicines only as told by your health care provider.  Get help right away if:  You have a severe headache.  You have unusual pain in your chest, abdomen, or back.  You are bleeding from your mouth or rectum, or you have black or tarry stool.  You have a very fast or irregular heartbeat (palpitations).  You have pain with breathing.  You faint once or repeatedly.  You have a seizure.  You are confused.  You have trouble walking.  You have severe weakness.  You have vision problems. These  symptoms may represent a serious problem that is an emergency. Do not wait to see if your symptoms will go away. Get medical help right away. Call your local emergency services (911 in the [place] Do not drive yourself to the hospital. This information is not intended to replace advice given to you by your health care provider. Make sure you discuss any questions you have with your health care provider. Document Released: 12/27/2004 Document Revised: 06/04/2015 Document Reviewed: 09/10/2014 Elsevier Interactive Patient Education  2018 [employer]  

## 2017-01-20 NOTE — Telephone Encounter (Signed)
Scheduled for Monday  °

## 2017-01-20 NOTE — Progress Notes (Signed)
*  PRELIMINARY RESULTS* Echocardiogram 2D Echocardiogram has been performed.  Sherrie Sport 01/20/2017, 1:56 PM

## 2017-01-20 NOTE — Consult Note (Signed)
Cardiology Consultation:   Patient ID: Peter [name]; [id number]; Nov 16, 1955   Admit date: 01/19/2017 Date of Consult: 01/20/2017  Primary Care Provider: [name], MD Primary Cardiologist: Peter [name]   Patient Profile:   Peter [name] is a 62 y.o. male with a hx of CAD medically managed, atrial flutter s/p DCCV 11/02/2015 on Eliquis, daily alcohol abuse, prostate cancer, HTN, tobacco abuse, HLD with statin intolerance on Praluent, GI bleed 05/2016, chronic back pain, GERD, gout, seizure disorder, and anxiety who is being seen today for the evaluation of fall vs syncope at the request of Dr. Jannifer [name].  History of Present Illness:   [name] most recent echo from 02/2016 showed EF 55-60%, no RWMA, mildly dilated LA. Most recent ischemic evaluation 2016 low risk as below. He was most recently seen in the office in 10/2016 and noted to be hypertensive at that time. Cardura was added.   Patient was recently started on Klonopin by PCP. He took his first dose on 1/10 with 3 beers. He stood up and suffered a fall vs syncopal episode. Never with any chest pain, SOB, palpitations, diaphoresis, nausea, or vomiting.   Upon the patient's arrival to [facility] they were found to have elevated BP of 810 systolic. EKG not acute as below, CXR was not obtained in the ED or at time of admission. CT head showed left orbital hematoma. Labs showed troponin 0.08-->0.06, HGB 11.9-->9.7, PLT 70-->52. Orthostatic vitals were not obtained. Cardiology was consulted. Echo is pending. Notes left orbit pain this morning, is otherwise asymptomatic.   Past Medical History:  Diagnosis Date  . Abnormality of gait 09/03/2012  . Alcohol abuse   . Anxiety   . Arthritis   . Arthritis   . Esophageal stricture   . GERD (gastroesophageal reflux disease)   . Gout   . Hyperlipidemia   . Hypertension   . Internal hemorrhoids   . Lesion of ulnar nerve 09/03/2012   Right ulnar neuropathy  . Murmur, cardiac   .  Palpitations   . Prostate cancer ([place]) 2016   treated with radioactive seed implant  . Seizures ([place])    pt states he had seizure 12/14  . Tubular adenoma of colon 03/2008    Past Surgical History:  Procedure Laterality Date  . CATARACT EXTRACTION W/PHACO Right 07/03/2014   Procedure: CATARACT EXTRACTION PHACO AND INTRAOCULAR LENS PLACEMENT (IOC);  Surgeon: Peter Glassing, MD;  Location: [facility] ORS;  Service: Ophthalmology;  Laterality: Right;  lot # [id number] H [place]: 00:32.3 AP% 9.1 CDE: 2.92  . CIRCUMCISION    . COLONOSCOPY    . ELBOW SURGERY    . ELECTROPHYSIOLOGIC STUDY N/A 11/02/2015   Procedure: CARDIOVERSION;  Surgeon: Peter Merritts, MD;  Location: [facility] ORS;  Service: Cardiovascular;  Laterality: N/A;  . ESOPHAGOGASTRODUODENOSCOPY (EGD) WITH PROPOFOL N/A 05/17/2016   Procedure: ESOPHAGOGASTRODUODENOSCOPY (EGD) WITH PROPOFOL;  Surgeon: Peter Lame, MD;  Location: [facility] ENDOSCOPY;  Service: Endoscopy;  Laterality: N/A;  . EYE SURGERY    . HAND SURGERY Left   . SHOULDER SURGERY       Home Meds: Prior to Admission medications   Medication Sig Start Date End Date Taking? Authorizing Provider  Alirocumab (PRALUENT) 150 MG/ML SOPN Inject 1 pen every 14 (fourteen) days into the skin. 11/28/16  Yes Gollan, Peter November, MD  BYSTOLIC 20 MG TABS TAKE 1 TABLET (20 MG TOTAL) BY MOUTH DAILY. 08/03/16  Yes Peter Merritts, MD  clonazePAM (KLONOPIN) 0.5 MG tablet Take 1 tablet (  0.5 mg total) by mouth daily. 01/17/17  Yes Peter Ducking, MD  colchicine 0.6 MG tablet Take 0.6 mg by mouth as needed (gout). For gout flare ups   Yes [provider]  ELIQUIS 5 MG TABS tablet TAKE ONE TABLET BY MOUTH TWICE A DAY 10/11/16  Yes Gollan, Peter November, MD  hydrOXYzine (ATARAX/VISTARIL) 50 MG tablet Take 50 mg by mouth at bedtime. Reported on 02/04/2015   Yes [provider]  lisinopril (PRINIVIL,ZESTRIL) 40 MG tablet TAKE 1 TABLET (40 MG TOTAL) BY MOUTH DAILY. 12/14/16  Yes Gollan, Peter November, MD    milk thistle 175 MG tablet Take 350 mg by mouth daily.    Yes [provider]  Multiple Vitamin (MULTIVITAMIN) tablet Take 1 tablet by mouth daily. Centrum Plus -Take 1 daily   Yes [provider]  tamsulosin (FLOMAX) 0.4 MG CAPS capsule Take 0.4 mg by mouth daily.    Yes [provider]  triamcinolone cream (KENALOG) 0.1 % Apply 1 application topically daily as needed.  12/24/14  Yes [provider]  VOLTAREN 1 % GEL Apply 2 g topically as needed (knees).  04/02/13  Yes [provider]  cloNIDine (CATAPRES) 0.1 MG tablet Take 1 tablet (0.1 mg total) by mouth 2 (two) times daily as needed. Patient not taking: Reported on 01/19/2017 10/10/16   Peter Merritts, MD    Inpatient Medications: Scheduled Meds: . apixaban  5 mg Oral BID  . folic acid  1 mg Oral Daily  . hydrOXYzine  50 mg Oral QHS  . lisinopril  40 mg Oral Daily  . multivitamin with minerals  1 tablet Oral Daily  . nebivolol  20 mg Oral Daily  . tamsulosin  0.4 mg Oral Daily  . thiamine  100 mg Oral Daily   Or  . thiamine  100 mg Intravenous Daily   Continuous Infusions:  PRN Meds: acetaminophen **OR** acetaminophen, LORazepam **OR** LORazepam, ondansetron **OR** ondansetron (ZOFRAN) IV, oxyCODONE  Allergies:   Allergies  Allergen Reactions  . Crestor [Rosuvastatin] Other (See Comments)    Bloating, swelling of legs, fatty liver  . Dust Mite Extract   . Hydrocodone Bitartrate Er   . Lipitor [Atorvastatin] Other (See Comments)    Myalgias  . Topamax [Topiramate]     Cognitive clouding    Social History:   Social History   Socioeconomic History  . Marital status: Married    Spouse name: Not on file  . Number of children: 0  . Years of education: hs  . Highest education level: Not on file  Social Needs  . Financial resource strain: Not on file  . Food insecurity - worry: Not on file  . Food insecurity - inability: Not on file  . Transportation needs - medical:  Not on file  . Transportation needs - non-medical: Not on file  Occupational History  . Occupation: Retired    Fish farm manager: Economist  . Occupation: UTILITIES OPER.    Employer: [employer]  Tobacco Use  . Smoking status: Current Every Day Smoker    Years: 24.00    Types: Cigarettes  . Smokeless tobacco: Never Used  . Tobacco comment: down to 2 ciggs a day.  Substance and Sexual Activity  . Alcohol use: Yes    Alcohol/week: 8.4 oz    Types: 14 Cans of beer per week    Comment: 4 beers per night  . Drug use: No    Comment: 1970's occ.   Marland Kitchen Sexual  activity: Not on file  Other Topics Concern  . Not on file  Social History Narrative   Lives with wife   Caffeine use: Drinks one cup coffee per morning   No soda or tea     Family History:   Family History  Problem Relation Age of Onset  . Diabetes Father   . Kidney disease Father   . Lung cancer Father        smoker  . Hyperlipidemia Father   . Hypertension Father   . Lung cancer Mother        smoke  . Hypertension Mother   . Colon cancer Neg Hx   . Pancreatic cancer Neg Hx   . Stomach cancer Neg Hx     ROS:  Review of Systems  Constitutional: Negative for chills, diaphoresis, fever, malaise/fatigue and weight loss.  HENT: Negative for congestion.   Eyes: Positive for pain. Negative for discharge and redness.  Respiratory: Negative for cough, hemoptysis, sputum production, shortness of breath and wheezing.   Cardiovascular: Negative for chest pain, palpitations, orthopnea, claudication, leg swelling and PND.  Gastrointestinal: Negative for abdominal pain, blood in stool, heartburn, melena, nausea and vomiting.  Genitourinary: Negative for hematuria.  Musculoskeletal: Positive for falls. Negative for myalgias.  Skin: Negative for rash.  Neurological: Positive for loss of consciousness and headaches. Negative for dizziness, tingling, tremors, sensory change, speech change, focal weakness, seizures and weakness.   Endo/Heme/Allergies: Does not bruise/bleed easily.  Psychiatric/Behavioral: Positive for substance abuse. The patient is not nervous/anxious.   All other systems reviewed and are negative.     Physical Exam/Data:   Vitals:   01/19/17 2150 01/19/17 2228 01/20/17 0407 01/20/17 0718  BP: 124/63 (!) 166/70 (!) 105/51 140/64  Pulse: 66 68 66 68  Resp: 18 18  20   Temp:  98.2 F (36.8 C)  98 F (36.7 C)  TempSrc:  Oral  Oral  SpO2: 95% 98% 92% 95%  Weight:      Height:        Intake/Output Summary (Last 24 hours) at 01/20/2017 0752 Last data filed at 01/20/2017 0525 Gross per 24 hour  Intake -  Output 0 ml  Net 0 ml   Filed Weights   01/19/17 1837  Weight: 245 lb (111.1 kg)   Body mass index is 33.23 kg/m.   Physical Exam: General: Well developed, well nourished, in no acute distress. Head: Normocephalic, contusion over the left orbit, sclera non-icteric, no xanthomas, nares without discharge.  Neck: Negative for carotid bruits. JVD not elevated. Lungs: Clear bilaterally to auscultation without wheezes, rales, or rhonchi. Breathing is unlabored. Heart: RRR with S1 S2. No murmurs, rubs, or gallops appreciated. Abdomen: Soft, non-tender, non-distended with normoactive bowel sounds. No hepatomegaly. No rebound/guarding. No obvious abdominal masses. Msk:  Strength and tone appear normal for age. Extremities: No clubbing or cyanosis. No edema. Distal pedal pulses are 2+ and equal bilaterally. Neuro: Alert and oriented X 3. No facial asymmetry. No focal deficit. Moves all extremities spontaneously. Psych:  Responds to questions appropriately with a normal affect.   EKG:  The EKG was personally reviewed and demonstrates: NSR, 76 bpm, occasional PVCs, baseline wandering, overall a poor study, left axis deviation, poor R wave progression Telemetry:  Telemetry was personally reviewed and demonstrates: NSR, frequent PVCs  Weights: Filed Weights   01/19/17 1837  Weight: 245 lb  (111.1 kg)    Relevant CV Studies: TTE 02/16/2016: Study Conclusions  - Left ventricle: The cavity size was normal.  There was moderate   concentric hypertrophy. Systolic function was normal. The   estimated ejection fraction was in the range of 55% to 60%. Wall   motion was normal; there were no regional wall motion   abnormalities. The study is not technically sufficient to allow   evaluation of LV diastolic function. - Aortic valve: Probably trileaflet; normal thickness, mildly   calcified leaflets. - Left atrium: The atrium was mildly dilated. - Pulmonary arteries: Systolic pressure could not be accurately   estimated.  Myoview 07/2014: Study Highlights    Nuclear stress EF: 64%.  There was no ST segment deviation noted during stress.  This is a low risk study.  The left ventricular ejection fraction is normal (55-65%).   Small, mild partially reversible basal to mid inferolateral defect.  This could be suggestive of ischemia but would also consider soft tissue attenuation.  EF 64% with normal wall motion. Overall low risk study.      Laboratory Data:  Chemistry Recent Labs  Lab 01/19/17 1838 01/20/17 0519  NA 139 140  K 4.2 4.1  CL 105 107  CO2 21* 23  GLUCOSE 118* 135*  BUN 9 11  CREATININE 0.80 1.05  CALCIUM 9.0 8.6*  GFRNONAA >60 >60  GFRAA >60 >60  ANIONGAP 13 10    No results for input(s): PROT, ALBUMIN, AST, ALT, ALKPHOS, BILITOT in the last 168 hours. Hematology Recent Labs  Lab 01/19/17 1838 01/20/17 0706  WBC 8.0 4.8  RBC 2.96* 2.39*  HGB 11.9* 9.7*  HCT 33.8* 27.3*  MCV 114.1* 114.5*  MCH 40.2* 40.6*  MCHC 35.2 35.4  RDW 14.8* 14.9*  PLT 70* 52*   Cardiac Enzymes Recent Labs  Lab 01/19/17 1840 01/19/17 2311 01/20/17 0519  TROPONINI 0.06* 0.08* 0.06*   No results for input(s): TROPIPOC in the last 168 hours.  BNPNo results for input(s): BNP, PROBNP in the last 168 hours.  DDimer No results for input(s): DDIMER in the last 168  hours.  Radiology/Studies:  Ct Head Wo Contrast  Result Date: 01/19/2017 IMPRESSION: 1. Large soft tissue edema/hematoma overlying the left orbit and lower left frontal bone. No underlying fracture or dislocation seen. Left orbital globe appears grossly intact and normal in configuration. No retro-orbital hemorrhage or edema. 2. No acute intracranial abnormality. No intracranial hemorrhage or edema. No skull fracture. 3. No fracture or acute subluxation within the cervical spine. Scoliosis and mild degenerative change. 4. Carotid atherosclerosis. Electronically Signed   By: Franki Cabot M.D.   On: 01/19/2017 19:21   Ct Cervical Spine Wo Contrast  Result Date: 01/19/2017 IMPRESSION: 1. Large soft tissue edema/hematoma overlying the left orbit and lower left frontal bone. No underlying fracture or dislocation seen. Left orbital globe appears grossly intact and normal in configuration. No retro-orbital hemorrhage or edema. 2. No acute intracranial abnormality. No intracranial hemorrhage or edema. No skull fracture. 3. No fracture or acute subluxation within the cervical spine. Scoliosis and mild degenerative change. 4. Carotid atherosclerosis. Electronically Signed   By: Franki Cabot M.D.   On: 01/19/2017 19:21    Assessment and Plan:   1. Fall vs syncope: -Appears to have occurred in the setting of recently started Klonopin and alcohol abuse. Patient took his first dose on 1/10 with 3 beers, stood up, and fell -Does not appear to be a primary cardiac event -Check CK -Monitor on telemetry -Echo pending -Defer further workup to IM given his known seizure disorder, polypharmacy, and alcohol abuse -Consider orthostatic vitals, though the  patient has already received IV fluids, making these not accurate, defer to IM  2. Demand ischemia: -Troponin minimally elevated at 0.08, down trending -Supply demand ischemia in the setting of fall vs syncope with possibel rhabdomyolysis  -Check CK -Echo  pending  3. CAD: -Echo pending as above -Consider outpatient ischemic evaluation if echo shows preserved EF and is without WMA concerning for ischemia -On Eliquis in place of ASA -Aggressive risk factor modification   4. Atrial flutter: -Maintain NSR -Bystolic -Eliquis 5 mg bid given his CHADS2VASc of 2 (HTN, vascular disease) -Given his history of seizure disorder and ongoing alcohol abuse, serious consideration will need to take place regarding his continued use of anticoagulation -May need to hold Eliquis given contusion of left orbit  5. Polypharmacy: -Suspect this is playing a significant role in his admission, coupled with alcohol abuse -Defer to IM  6. Alcohol abuse: -Cessation advised -Per IM  7. HTN: -Improved from prior clinic visit -Continue current medications  8. Left orbit contusion: -Per IM -May need to hold Eliquis  9. Anemia/thrombocytopenia: -Hold Eliquis as above -Likely some component of dilution, though not entirely dilutional -Per IM   For questions or updates, please contact [place] Please consult [email or web address] for contact info under Cardiology/STEMI.   Signed, Christell Faith, PA-C [place] Pager: [phone] 01/20/2017, 7:52 AM

## 2017-01-20 NOTE — Discharge Summary (Signed)
[facility] NAME: Peter [name]    MR#:  [id number]  DATE OF BIRTH:  April 26, 1955  DATE OF ADMISSION:  01/19/2017 ADMITTING PHYSICIAN: Lance Coon, MD  DATE OF DISCHARGE: 01/20/2017  PRIMARY CARE PHYSICIAN: [name], MD    ADMISSION DIAGNOSIS:  Syncope and collapse [R55] Elevated troponin [R74.8]  DISCHARGE DIAGNOSIS:  Principal Problem:   Polypharmacy Active Problems:   Essential hypertension   CAD (coronary artery disease)   Anxiety   Syncope   Elevated troponin   Alcohol abuse   SECONDARY DIAGNOSIS:   Past Medical History:  Diagnosis Date  . Abnormality of gait 09/03/2012  . Alcohol abuse   . Anxiety   . Arthritis   . Arthritis   . Esophageal stricture   . GERD (gastroesophageal reflux disease)   . Gout   . Hyperlipidemia   . Hypertension   . Internal hemorrhoids   . Lesion of ulnar nerve 09/03/2012   Right ulnar neuropathy  . Murmur, cardiac   . Palpitations   . Prostate cancer ([place]) 2016   treated with radioactive seed implant  . Seizures ([place])    pt states he had seizure 12/14  . Tubular adenoma of colon 03/2008    HOSPITAL COURSE:   62 year old male with a history of atrial flutter on anticoagulation and essential hypertension who presents with syncopal event.  1. Syncope: This is due to the fact patient was taking anxiolytic with EtOH. Patient is highly encouraged not to do this. For now we will discontinue anxiety medications. He will need a follow-up with his neurologist for better medication for his tremors. He was evaluated by cardiology. Echocardiogram shows Doppler parameters are consistent with abnormal   left ventricular relaxation (grade 1 diastolic dysfunction normal EF.  2. Elevated troponin: This is due to demand ischemia. Patient was ruled out for ACS.  3. Atrial flutter: Patient is currently in normal sinus rhythm. He will continue Bystolic. Due to the left eye swelling and  hematoma it is advised that patient stop Eliquis until swelling has resolved.  4. Essential hypertension: Continue Bystolic      DISCHARGE CONDITIONS AND DIET:  Stable Cardiac diet  CONSULTS OBTAINED:  Treatment Team:  Wellington Hampshire, MD  DRUG ALLERGIES:   Allergies  Allergen Reactions  . Crestor [Rosuvastatin] Other (See Comments)    Bloating, swelling of legs, fatty liver  . Dust Mite Extract   . Hydrocodone Bitartrate Er   . Lipitor [Atorvastatin] Other (See Comments)    Myalgias  . Topamax [Topiramate]     Cognitive clouding    DISCHARGE MEDICATIONS:   Allergies as of 01/20/2017      Reactions   Crestor [rosuvastatin] Other (See Comments)   Bloating, swelling of legs, fatty liver   Dust Mite Extract    Hydrocodone Bitartrate Er    Lipitor [atorvastatin] Other (See Comments)   Myalgias   Topamax [topiramate]    Cognitive clouding      Medication List    STOP taking these medications   clonazePAM 0.5 MG tablet Commonly known as:  KLONOPIN     TAKE these medications   Alirocumab 150 MG/ML Sopn Commonly known as:  PRALUENT Inject 1 pen every 14 (fourteen) days into the skin.   apixaban 5 MG Tabs tablet Commonly known as:  ELIQUIS Take 1 tablet (5 mg total) by mouth 2 (two) times daily. Please do not take this while you have swelling on your eye  What changed:    how much to take  additional instructions   BYSTOLIC 20 MG Tabs Generic drug:  Nebivolol HCl TAKE 1 TABLET (20 MG TOTAL) BY MOUTH DAILY.   cloNIDine 0.1 MG tablet Commonly known as:  CATAPRES Take 1 tablet (0.1 mg total) by mouth 2 (two) times daily as needed.   colchicine 0.6 MG tablet Take 0.6 mg by mouth as needed (gout). For gout flare ups   hydrOXYzine 50 MG tablet Commonly known as:  ATARAX/VISTARIL Take 50 mg by mouth at bedtime. Reported on 02/04/2015   lisinopril 40 MG tablet Commonly known as:  PRINIVIL,ZESTRIL TAKE 1 TABLET (40 MG TOTAL) BY MOUTH DAILY.   milk  thistle 175 MG tablet Take 350 mg by mouth daily.   multivitamin tablet Take 1 tablet by mouth daily. Centrum Plus -Take 1 daily   tamsulosin 0.4 MG Caps capsule Commonly known as:  FLOMAX Take 0.4 mg by mouth daily.   triamcinolone cream 0.1 % Commonly known as:  KENALOG Apply 1 application topically daily as needed.   VOLTAREN 1 % Gel Generic drug:  diclofenac sodium Apply 2 g topically as needed (knees).         Today   CHIEF COMPLAINT:  Doing well this am   VITAL SIGNS:  Blood pressure 140/64, pulse 68, temperature 98 F (36.7 C), temperature source Oral, resp. rate 20, height 6' (1.829 m), weight 111.1 kg (245 lb), SpO2 95 %.   REVIEW OF SYSTEMS:  Review of Systems  Constitutional: Negative.  Negative for chills, fever and malaise/fatigue.  HENT: Negative.  Negative for ear discharge, ear pain, hearing loss, nosebleeds and sore throat.   Eyes: Positive for redness (swelling). Negative for blurred vision and pain.  Respiratory: Negative.  Negative for cough, hemoptysis, shortness of breath and wheezing.   Cardiovascular: Negative.  Negative for chest pain, palpitations and leg swelling.  Gastrointestinal: Negative.  Negative for abdominal pain, blood in stool, diarrhea, nausea and vomiting.  Genitourinary: Negative.  Negative for dysuria.  Musculoskeletal: Negative.  Negative for back pain.  Skin: Negative.   Neurological: Negative for dizziness, tremors, speech change, focal weakness, seizures and headaches.  Endo/Heme/Allergies: Negative.  Does not bruise/bleed easily.  Psychiatric/Behavioral: Negative.  Negative for depression, hallucinations and suicidal ideas.     PHYSICAL EXAMINATION:  GENERAL:  62 y.o.-year-old patient lying in the bed with no acute distress.  NECK:  Supple, no jugular venous distention. No thyroid enlargement, no tenderness.  LUNGS: Normal breath sounds bilaterally, no wheezing, rales,rhonchi  No use of accessory muscles of  respiration.  CARDIOVASCULAR: S1, S2 normal. No murmurs, rubs, or gallops.  ABDOMEN: Soft, non-tender, non-distended. Bowel sounds present. No organomegaly or mass.  EXTREMITIES: No pedal edema, cyanosis, or clubbing.  PSYCHIATRIC: The patient is alert and oriented x 3.  SKIN: large hematoma left eye  DATA REVIEW:   CBC Recent Labs  Lab 01/20/17 0706  WBC 4.8  HGB 9.7*  HCT 27.3*  PLT 52*    Chemistries  Recent Labs  Lab 01/20/17 0519  NA 140  K 4.1  CL 107  CO2 23  GLUCOSE 135*  BUN 11  CREATININE 1.05  CALCIUM 8.6*    Cardiac Enzymes Recent Labs  Lab 01/19/17 1840 01/19/17 2311 01/20/17 0519  TROPONINI 0.06* 0.08* 0.06*    Microbiology Results  @MICRORSLT48 @  RADIOLOGY:  Ct Head Wo Contrast  Result Date: 01/19/2017 CLINICAL DATA:  Trauma. EXAM: CT HEAD WITHOUT CONTRAST CT CERVICAL SPINE WITHOUT CONTRAST TECHNIQUE: Multidetector  CT imaging of the head and cervical spine was performed following the standard protocol without intravenous contrast. Multiplanar CT image reconstructions of the cervical spine were also generated. COMPARISON:  Head CT dated 12/22/2012. FINDINGS: CT HEAD FINDINGS Brain: Generalized age related parenchymal atrophy with commensurate dilatation of the ventricles and sulci. Mild chronic small vessel ischemic change within the deep periventricular white matter regions. No mass, hemorrhage, edema or other evidence of acute parenchymal abnormality. No extra-axial hemorrhage. Vascular: There are chronic calcified atherosclerotic changes of the large vessels at the skull base. No unexpected hyperdense vessel. Skull: Normal. Negative for fracture or focal lesion. Sinuses/Orbits: Mucosal thickening and/or fluid within the right maxillary sinus, of uncertain age. Large soft tissue edema/hematoma overlying the left orbit and left lower frontal bone. No underlying fracture seen. Other: None. CT CERVICAL SPINE FINDINGS Alignment: Mild scoliosis which may be  accentuated by patient positioning. No evidence of acute vertebral body subluxation. Skull base and vertebrae: No fracture line or displaced fracture fragment identified. Facet joints appear intact and normally aligned throughout. Soft tissues and spinal canal: No prevertebral fluid or swelling. No visible canal hematoma. Disc levels: Disc desiccations within the lower cervical spine, with associated disc space narrowings and mild osseous spurring. No more than mild central canal stenosis at any level. Upper chest: Negative. Other: Carotid atherosclerosis. IMPRESSION: 1. Large soft tissue edema/hematoma overlying the left orbit and lower left frontal bone. No underlying fracture or dislocation seen. Left orbital globe appears grossly intact and normal in configuration. No retro-orbital hemorrhage or edema. 2. No acute intracranial abnormality. No intracranial hemorrhage or edema. No skull fracture. 3. No fracture or acute subluxation within the cervical spine. Scoliosis and mild degenerative change. 4. Carotid atherosclerosis. Electronically Signed   By: Franki Cabot M.D.   On: 01/19/2017 19:21   Ct Cervical Spine Wo Contrast  Result Date: 01/19/2017 CLINICAL DATA:  Trauma. EXAM: CT HEAD WITHOUT CONTRAST CT CERVICAL SPINE WITHOUT CONTRAST TECHNIQUE: Multidetector CT imaging of the head and cervical spine was performed following the standard protocol without intravenous contrast. Multiplanar CT image reconstructions of the cervical spine were also generated. COMPARISON:  Head CT dated 12/22/2012. FINDINGS: CT HEAD FINDINGS Brain: Generalized age related parenchymal atrophy with commensurate dilatation of the ventricles and sulci. Mild chronic small vessel ischemic change within the deep periventricular white matter regions. No mass, hemorrhage, edema or other evidence of acute parenchymal abnormality. No extra-axial hemorrhage. Vascular: There are chronic calcified atherosclerotic changes of the large vessels at  the skull base. No unexpected hyperdense vessel. Skull: Normal. Negative for fracture or focal lesion. Sinuses/Orbits: Mucosal thickening and/or fluid within the right maxillary sinus, of uncertain age. Large soft tissue edema/hematoma overlying the left orbit and left lower frontal bone. No underlying fracture seen. Other: None. CT CERVICAL SPINE FINDINGS Alignment: Mild scoliosis which may be accentuated by patient positioning. No evidence of acute vertebral body subluxation. Skull base and vertebrae: No fracture line or displaced fracture fragment identified. Facet joints appear intact and normally aligned throughout. Soft tissues and spinal canal: No prevertebral fluid or swelling. No visible canal hematoma. Disc levels: Disc desiccations within the lower cervical spine, with associated disc space narrowings and mild osseous spurring. No more than mild central canal stenosis at any level. Upper chest: Negative. Other: Carotid atherosclerosis. IMPRESSION: 1. Large soft tissue edema/hematoma overlying the left orbit and lower left frontal bone. No underlying fracture or dislocation seen. Left orbital globe appears grossly intact and normal in configuration. No retro-orbital hemorrhage or edema.  2. No acute intracranial abnormality. No intracranial hemorrhage or edema. No skull fracture. 3. No fracture or acute subluxation within the cervical spine. Scoliosis and mild degenerative change. 4. Carotid atherosclerosis. Electronically Signed   By: Franki Cabot M.D.   On: 01/19/2017 19:21      Allergies as of 01/20/2017      Reactions   Crestor [rosuvastatin] Other (See Comments)   Bloating, swelling of legs, fatty liver   Dust Mite Extract    Hydrocodone Bitartrate Er    Lipitor [atorvastatin] Other (See Comments)   Myalgias   Topamax [topiramate]    Cognitive clouding      Medication List    STOP taking these medications   clonazePAM 0.5 MG tablet Commonly known as:  KLONOPIN     TAKE these  medications   Alirocumab 150 MG/ML Sopn Commonly known as:  PRALUENT Inject 1 pen every 14 (fourteen) days into the skin.   apixaban 5 MG Tabs tablet Commonly known as:  ELIQUIS Take 1 tablet (5 mg total) by mouth 2 (two) times daily. Please do not take this while you have swelling on your eye What changed:    how much to take  additional instructions   BYSTOLIC 20 MG Tabs Generic drug:  Nebivolol HCl TAKE 1 TABLET (20 MG TOTAL) BY MOUTH DAILY.   cloNIDine 0.1 MG tablet Commonly known as:  CATAPRES Take 1 tablet (0.1 mg total) by mouth 2 (two) times daily as needed.   colchicine 0.6 MG tablet Take 0.6 mg by mouth as needed (gout). For gout flare ups   hydrOXYzine 50 MG tablet Commonly known as:  ATARAX/VISTARIL Take 50 mg by mouth at bedtime. Reported on 02/04/2015   lisinopril 40 MG tablet Commonly known as:  PRINIVIL,ZESTRIL TAKE 1 TABLET (40 MG TOTAL) BY MOUTH DAILY.   milk thistle 175 MG tablet Take 350 mg by mouth daily.   multivitamin tablet Take 1 tablet by mouth daily. Centrum Plus -Take 1 daily   tamsulosin 0.4 MG Caps capsule Commonly known as:  FLOMAX Take 0.4 mg by mouth daily.   triamcinolone cream 0.1 % Commonly known as:  KENALOG Apply 1 application topically daily as needed.   VOLTAREN 1 % Gel Generic drug:  diclofenac sodium Apply 2 g topically as needed (knees).          Management plans discussed with the patient and he is in agreement. Stable for discharge home  Patient should follow up with cardiology and PCP  CODE STATUS:     Code Status Orders  (From admission, onward)        Start     Ordered   01/19/17 2250  Full code  Continuous     01/19/17 2249    Code Status History    Date Active Date Inactive Code Status Order ID Comments User Context   05/16/2016 21:51 05/17/2016 16:54 Full Code [id number]  Henreitta Leber, MD Inpatient      TOTAL TIME TAKING CARE OF THIS PATIENT: 38 minutes.    Note: This dictation was  prepared with Dragon dictation along with smaller phrase technology. Any transcriptional errors that result from this process are unintentional.  Mcihael Hinderman M.D on 01/20/2017 at 8:50 AM  Between 7am to 6pm - Pager - [phone] After 6pm go to [email or web address] - password [email or web address] [facility]  Office  [phone]  CC: Primary care physician; [name], MD

## 2017-01-21 LAB — HIV ANTIBODY (ROUTINE TESTING W REFLEX): HIV Screen 4th Generation wRfx: NONREACTIVE

## 2017-01-23 ENCOUNTER — Encounter: Payer: Self-pay | Admitting: Family Medicine

## 2017-01-23 ENCOUNTER — Ambulatory Visit: Payer: 59 | Admitting: Family Medicine

## 2017-01-23 VITALS — BP 130/70 | HR 68 | Temp 98.3°F | Wt 249.6 lb

## 2017-01-23 DIAGNOSIS — F1010 Alcohol abuse, uncomplicated: Secondary | ICD-10-CM | POA: Diagnosis not present

## 2017-01-23 DIAGNOSIS — T148XXA Other injury of unspecified body region, initial encounter: Secondary | ICD-10-CM | POA: Diagnosis not present

## 2017-01-23 DIAGNOSIS — D696 Thrombocytopenia, unspecified: Secondary | ICD-10-CM

## 2017-01-23 DIAGNOSIS — R55 Syncope and collapse: Secondary | ICD-10-CM | POA: Diagnosis not present

## 2017-01-23 NOTE — Patient Instructions (Addendum)
Nice to see you. We will check lab work today and contact you with the results. Please do not take anymore the Klonopin. We will get you into see your eye doctor. If you have any headaches, vision changes, eye pain, or any new or changing symptoms please seek medical attention.  I would recommend decreasing your alcohol intake.  Please do not cut this out all at once as you run the risk of withdrawing.

## 2017-01-23 NOTE — Telephone Encounter (Signed)
Transition Care Management Follow-up Telephone Call  How have you been since you were released from the hospital? Feeling better   Do you understand why you were in the hospital? yes   Do you understand the discharge instrcutions? yes  Items Reviewed:  Medications reviewed: yes  Allergies reviewed: yes  Dietary changes reviewed: yes  Referrals reviewed: yes   Functional Questionnaire:   Activities of Daily Living (ADLs):   He states they are independent in the following: ambulation, bathing and hygiene, feeding, continence, grooming, toileting and dressing States they require assistance with the following: NO assistance required   Any transportation issues/concerns?: no   Any patient concerns? no   Confirmed importance and date/time of follow-up visits scheduled: yes   Confirmed with patient if condition begins to worsen call PCP or go to the ER.  Patient was given the Call-a-Nurse line [phone]: yes

## 2017-01-23 NOTE — Assessment & Plan Note (Signed)
Patient is chronically thrombocytopenic with macrocytic anemia.  Possibly related to alcohol use.  We will recheck today.  He will need hematology evaluation.  Referral has been placed.

## 2017-01-23 NOTE — Assessment & Plan Note (Signed)
With alcohol abuse.  Offered referral for treatment though he declined.  Discussed decreasing alcohol intake slowly.  Advised not to completely cut out alcohol given risk of withdrawal and death.  He voiced understanding.

## 2017-01-23 NOTE — Progress Notes (Signed)
Tommi Rumps, MD Phone: [phone]  Peter [name] is a 62 y.o. male who presents today for hospital follow-up.  Patient was hospitalized from 01/19/17-01/20/17 for syncope.  Patient had been evaluated by his neurologist for tremors and placed on Klonopin for this.  He had 3 beers over a span of 3 hours and took Klonopin and then the next thing he knew he was on the kitchen floor.  He does not remember how he got there.  No apparent postictal phase.  He was subsequently evaluated in the emergency room and noted to have a left facial hematoma.  He underwent CT head and neck.  There was a large soft tissue hematoma overlying the left orbit and left lower frontal bone with no underlying fracture.  The left orbital globe appeared grossly intact and normal in configuration.  There was no retro-orbital hemorrhage or edema.  No acute intracranial findings.  He was hospitalized and monitored.  His Eliquis was held particularly given his low platelets and hematoma.  He was evaluated by cardiology and underwent echo with grade 1 diastolic dysfunction and mildly dilated left and right atrium.  He reports no further syncopal episodes.  It was questioned whether or not he actually had a syncopal episode or he fell.  He may have had a concussion from hitting his head and that is why he cannot remember the event.  He notes his eye is much less swollen.  He does still have bruising and swelling above his left eye and has also developed bruising along his left cheek as well as under his right eye to some degree.  He appears to have a subconjunctival hemorrhage now.  He has had no headaches, vision changes, or eye pain.  He reports not having much of an appetite over the last year or so.  No early satiety.  Minimal depression if any.  No SI.  He reports he drinks 4-5 alcoholic beverages a day.  Discharge summary is not yet completed to review.  Medications have been reviewed.  He does report a bruise over his lower back  that has come on since leaving the hospital.  Does note some mild discomfort.  He notes no numbness or weakness.  No saddle anesthesia or bowel or bladder incontinence.  Social History   Tobacco Use  Smoking Status Current Every Day Smoker  . Years: 24.00  . Types: Cigarettes  Smokeless Tobacco Never Used  Tobacco Comment   down to 2 ciggs a day.     ROS see history of present illness  Objective  Physical Exam Vitals:   01/23/17 1035  BP: 130/70  Pulse: 68  Temp: 98.3 F (36.8 C)  SpO2: 95%    BP Readings from Last 3 Encounters:  01/23/17 130/70  01/20/17 (!) 126/50  01/17/17 (!) 180/82   Wt Readings from Last 3 Encounters:  01/23/17 249 lb 9.6 oz (113.2 kg)  01/19/17 245 lb (111.1 kg)  01/17/17 245 lb 8 oz (111.4 kg)    Physical Exam  Constitutional: No distress.  HENT:  Head: Head is without Battle's sign.    Normal tympanic membranes, no bony tenderness around the orbits or nasal bone  Eyes: EOM are normal. Pupils are equal, round, and reactive to light.  Apparent left subconjunctival hemorrhage, no bullous features, no apparent blood in the anterior chamber, fundus exam difficult to complete given pupillary constriction, normal sclera on the right  Cardiovascular: Normal rate, regular rhythm and normal heart sounds.  Pulmonary/Chest: Effort normal  and breath sounds normal.  Musculoskeletal: He exhibits no edema.  Bruising noted over lower lumbar and sacral areas, slightly tender, no erythema  Neurological: He is alert. Gait normal.  CN 2-12 intact, 5/5 strength in bilateral biceps, triceps, grip, quads, hamstrings, plantar and dorsiflexion, sensation to light touch intact in bilateral UE and LE  Skin: Skin is warm and dry. He is not diaphoretic.     Assessment/Plan: Please see individual problem list.  Syncope Question syncope versus fall with head injury and concussion leading to memory deficits.  He underwent appropriate evaluation in the hospital.   CT head and neck with no fracture noted.  His hematoma sounds to be improving.  Discussed that the dependent edema in his lower face is to be expected.  Advised it may take some time for his hematoma to resolve.  He does appear to have a subconjunctival hemorrhage.  Vision is minimally different between his left and right eye.  He has had no pain or significant vision changes per his report.  CT scan with no apparent globe injury.  We will have him see his ophthalmologist.  I discussed trying to get him to see them today given the extent of his subconjunctival hemorrhage though patient deferred to Friday when he will be able to go.  Discussed risk of not having this evaluated if there was some other underlying cause.  Patient voiced understanding.  I also discussed x-ray of his low back and sacral area given bruising and likely injury to this area though he declined.  He will let us know if he changes his mind.  He will monitor this area.  Patient was advised not to take Klonopin anymore.  He will continue to hold his Eliquis at this time.  Alcohol abuse With alcohol abuse.  Offered referral for treatment though he declined.  Discussed decreasing alcohol intake slowly.  Advised not to completely cut out alcohol given risk of withdrawal and death.  He voiced understanding.  Thrombocytopenia ([place]) Patient is chronically thrombocytopenic with macrocytic anemia.  Possibly related to alcohol use.  We will recheck today.  He will need hematology evaluation.  Referral has been placed.   Orders Placed This Encounter  Procedures  . CBC w/Diff  . Comp Met (CMET)  . INR/PT  . Ambulatory referral to Hematology    Referral Priority:   Routine    Referral Type:   Consultation    Referral Reason:   Specialty Services Required    Requested Specialty:   Oncology    Number of Visits Requested:   1    No orders of the defined types were placed in this encounter.    Tommi Rumps, MD [place]

## 2017-01-23 NOTE — Assessment & Plan Note (Addendum)
Question syncope versus fall with head injury and concussion leading to memory deficits.  He underwent appropriate evaluation in the hospital.  CT head and neck with no fracture noted.  His hematoma sounds to be improving.  Discussed that the dependent edema in his lower face is to be expected.  Advised it may take some time for his hematoma to resolve.  He does appear to have a subconjunctival hemorrhage.  Vision is minimally different between his left and right eye.  He has had no pain or significant vision changes per his report.  CT scan with no apparent globe injury.  We will have him see his ophthalmologist.  I discussed trying to get him to see them today given the extent of his subconjunctival hemorrhage though patient deferred to Friday when he will be able to go.  Discussed risk of not having this evaluated if there was some other underlying cause.  Patient voiced understanding.  I also discussed x-ray of his low back and sacral area given bruising and likely injury to this area though he declined.  He will let us know if he changes his mind.  He will monitor this area.  Patient was advised not to take Klonopin anymore.  He will continue to hold his Eliquis at this time.

## 2017-01-24 LAB — COMPREHENSIVE METABOLIC PANEL
A/G RATIO: 1.1 — AB (ref 1.2–2.2)
ALK PHOS: 99 IU/L (ref 39–117)
ALT: 40 IU/L (ref 0–44)
AST: 100 IU/L — ABNORMAL HIGH (ref 0–40)
Albumin: 3.5 g/dL — ABNORMAL LOW (ref 3.6–4.8)
BILIRUBIN TOTAL: 3.4 mg/dL — AB (ref 0.0–1.2)
BUN/Creatinine Ratio: 7 — ABNORMAL LOW (ref 10–24)
BUN: 7 mg/dL — ABNORMAL LOW (ref 8–27)
CHLORIDE: 102 mmol/L (ref 96–106)
CO2: 20 mmol/L (ref 20–29)
Calcium: 9.1 mg/dL (ref 8.6–10.2)
Creatinine, Ser: 0.98 mg/dL (ref 0.76–1.27)
GFR calc Af Amer: 96 mL/min/{1.73_m2} (ref >59)
GFR calc non Af Amer: 83 mL/min/{1.73_m2} (ref >59)
GLOBULIN, TOTAL: 3.1 g/dL (ref 1.5–4.5)
Glucose: 158 mg/dL — ABNORMAL HIGH (ref 65–99)
POTASSIUM: 4.5 mmol/L (ref 3.5–5.2)
SODIUM: 138 mmol/L (ref 134–144)
Total Protein: 6.6 g/dL (ref 6.0–8.5)

## 2017-01-24 LAB — CBC WITH DIFFERENTIAL/PLATELET
BASOS ABS: 0.1 10*3/uL (ref 0.0–0.2)
BASOS: 1 %
EOS (ABSOLUTE): 0.2 10*3/uL (ref 0.0–0.4)
Eos: 3 %
HEMATOCRIT: 26.4 % — AB (ref 37.5–51.0)
Hemoglobin: 9.7 g/dL — ABNORMAL LOW (ref 13.0–17.7)
Immature Grans (Abs): 0 10*3/uL (ref 0.0–0.1)
Immature Granulocytes: 0 %
Lymphocytes Absolute: 2.7 10*3/uL (ref 0.7–3.1)
Lymphs: 41 %
MCH: 38.3 pg — ABNORMAL HIGH (ref 26.6–33.0)
MCHC: 36.7 g/dL — AB (ref 31.5–35.7)
MCV: 104 fL — AB (ref 79–97)
MONOS ABS: 1.1 10*3/uL — AB (ref 0.1–0.9)
Monocytes: 17 %
NEUTROS ABS: 2.5 10*3/uL (ref 1.4–7.0)
NEUTROS PCT: 38 %
Platelets: 87 10*3/uL — CL (ref 150–379)
RBC: 2.53 x10E6/uL — CL (ref 4.14–5.80)
RDW: 15.4 % (ref 12.3–15.4)
WBC: 6.6 10*3/uL (ref 3.4–10.8)

## 2017-01-24 LAB — PROTIME-INR
INR: 1.6 — AB (ref 0.8–1.2)
PROTHROMBIN TIME: 16.2 s — AB (ref 9.1–12.0)

## 2017-01-25 ENCOUNTER — Other Ambulatory Visit: Payer: Self-pay | Admitting: Family Medicine

## 2017-01-25 DIAGNOSIS — R945 Abnormal results of liver function studies: Secondary | ICD-10-CM

## 2017-01-25 DIAGNOSIS — D696 Thrombocytopenia, unspecified: Secondary | ICD-10-CM

## 2017-01-25 DIAGNOSIS — R7989 Other specified abnormal findings of blood chemistry: Secondary | ICD-10-CM

## 2017-01-26 ENCOUNTER — Telehealth: Payer: Self-pay | Admitting: Cardiovascular Disease

## 2017-01-26 NOTE — Telephone Encounter (Signed)
Patient wants to do labs for upcoming appt while he is at [facility] this afternoon.  Can we send lab order to [facility] on [address]?  Please call patient

## 2017-01-26 NOTE — Telephone Encounter (Signed)
Pt is returning your call

## 2017-01-26 NOTE — Telephone Encounter (Signed)
Pt scheduled appt 1/21 with Dr. Rockey Situ. He called requesting lab orders for this upcoming appt as he is having labs for PCP today. Dr. Rockey Situ has not ordered any labs ahead of time. Left detailed message on pt's VM explaining this. Provided call back number if further questions.

## 2017-01-26 NOTE — Telephone Encounter (Signed)
S/w patient who was recently in the hospital for fall. Patient would like to know if there's any blood work Dr Rockey Situ needs patient to get before appointment on 1/21. Patient has recently had CBC, CMET and other labs while in the hospital. Last LIPID panel was 05/2016 so it has not been a full year. Advised at this time patient would not need additional lab work from cardiology standpoint but to follow orders of his other doctor. He verbalized understanding.

## 2017-01-27 ENCOUNTER — Encounter: Payer: Self-pay | Admitting: Oncology

## 2017-01-27 ENCOUNTER — Inpatient Hospital Stay: Payer: 59 | Attending: Oncology | Admitting: Oncology

## 2017-01-27 ENCOUNTER — Other Ambulatory Visit: Payer: Self-pay

## 2017-01-27 VITALS — BP 103/66 | HR 98 | Temp 98.2°F | Resp 16 | Wt 248.0 lb

## 2017-01-27 DIAGNOSIS — R162 Hepatomegaly with splenomegaly, not elsewhere classified: Secondary | ICD-10-CM

## 2017-01-27 DIAGNOSIS — Z9181 History of falling: Secondary | ICD-10-CM | POA: Insufficient documentation

## 2017-01-27 DIAGNOSIS — R7989 Other specified abnormal findings of blood chemistry: Secondary | ICD-10-CM

## 2017-01-27 DIAGNOSIS — K760 Fatty (change of) liver, not elsewhere classified: Secondary | ICD-10-CM | POA: Diagnosis not present

## 2017-01-27 DIAGNOSIS — R945 Abnormal results of liver function studies: Secondary | ICD-10-CM | POA: Insufficient documentation

## 2017-01-27 DIAGNOSIS — D696 Thrombocytopenia, unspecified: Secondary | ICD-10-CM

## 2017-01-27 DIAGNOSIS — Z720 Tobacco use: Secondary | ICD-10-CM | POA: Insufficient documentation

## 2017-01-27 DIAGNOSIS — F1010 Alcohol abuse, uncomplicated: Secondary | ICD-10-CM | POA: Diagnosis not present

## 2017-01-27 DIAGNOSIS — K769 Liver disease, unspecified: Secondary | ICD-10-CM

## 2017-01-27 DIAGNOSIS — D539 Nutritional anemia, unspecified: Secondary | ICD-10-CM

## 2017-01-27 DIAGNOSIS — S0512XA Contusion of eyeball and orbital tissues, left eye, initial encounter: Secondary | ICD-10-CM | POA: Diagnosis not present

## 2017-01-27 NOTE — Progress Notes (Signed)
Hematology/Oncology Consult note [facility] Telephone:([phone] [phone]  Patient Care Team: [name], MD as PCP - General (Family Medicine) Garald Balding, MD as Consulting Physician (Orthopedic Surgery) Minna Merritts, MD as Consulting Physician (Cardiology)   Name of the patient: Peter [name]  [id number]  09/03/1955    Reason for referral- thrombocytopenia   Referring physician- Dr. Caryl Bis  Date of visit: 01/27/17   History of presenting illness-patient is a 62 year old male who has been referred to [place] for evaluation and management of thrombocytopenia.  Recent CBC from 01/23/2017 revealed a white count of 6.6, H&H of 9.7/26.4 with an MCV of 104 and a platelet count of 87.  Of note patient has had mild to moderate thrombocytopenia since 2014.  In 2014 his platelet count was 141 and drifted down to the 130s in 2017.  Since May 2018 his platelet counts have been fluctuating between 50-80.  Patient is also had long-standing macrocytosis and his MCV typically ranges from 104- 112.  On reviewing his CMP patient's recent, total bilirubin was elevated at 3.4, AST elevated at 100.  Since January 2017 he has had an elevated total bilirubin recent albumin was mildly low at 3.5.  Last MRI abdomen from 2015 had shown mild hepatic steatosis  Patient had a fall on 01/19/2017 for unclear reasons.  He was started on Klonopin by his neurologist for his tremors and patient states that he took a dose of Klonopin followed by 3 beers and he passed out on the kitchen floor and had to be hospitalized.  He still has significant bruising over the left half of his face as well as in front of his neck as well as subconjunctival hemorrhage.  Patient admits to drinking alcohol and says that he was a heavy drinker since the age of 43 but has slowed down recently.  In the past he used to drink 12 packs of beer and now he is down to 3-6 packs of beer per day.   Patient has seen [facility] in the past and has undergone EGD and colonoscopy.  Recent upper endoscopy was in May 2018 which was done for melena and was normal.  ECOG PS- 1  Pain scale- 4   Review of systems- Review of Systems  Constitutional: Positive for malaise/fatigue. Negative for chills, fever and weight loss.       Lack of appetite  HENT: Negative for congestion, ear discharge and nosebleeds.   Eyes: Negative for blurred vision.  Respiratory: Negative for cough, hemoptysis, sputum production, shortness of breath and wheezing.   Cardiovascular: Negative for chest pain, palpitations, orthopnea and claudication.  Gastrointestinal: Negative for abdominal pain, blood in stool, constipation, diarrhea, heartburn, melena, nausea and vomiting.  Genitourinary: Negative for dysuria, flank pain, frequency, hematuria and urgency.  Musculoskeletal: Negative for back pain, joint pain and myalgias.  Skin: Negative for rash.  Neurological: Positive for tremors. Negative for dizziness, tingling, focal weakness, seizures, weakness and headaches.  Endo/Heme/Allergies: Does not bruise/bleed easily.  Psychiatric/Behavioral: Negative for depression and suicidal ideas. The patient does not have insomnia.     Allergies  Allergen Reactions  . Crestor [Rosuvastatin] Other (See Comments)    Bloating, swelling of legs, fatty liver  . Dust Mite Extract   . Hydrocodone Bitartrate Er   . Lipitor [Atorvastatin] Other (See Comments)    Myalgias  . Topamax [Topiramate]     Cognitive clouding    Patient Active Problem List   Diagnosis Date Noted  .  Thrombocytopenia ([place]) 01/23/2017  . Polypharmacy 01/20/2017  . Syncope 01/19/2017  . Elevated troponin 01/19/2017  . Alcohol abuse 01/19/2017  . Sinusitis 06/08/2016  . Tremor 03/23/2016  . Paresthesia 03/23/2016  . Anxiety 02/23/2016  . Typical atrial flutter ([place]) 09/21/2015  . CAD (coronary artery disease) 05/06/2015  . Aortic atherosclerosis  ([place]) 03/20/2015  . History of alcohol abuse 02/04/2015  . Alcohol induced fatty liver 02/04/2015  . Obesity (BMI 30.0-34.9) 02/04/2015  . Back pain 02/04/2015  . History of prostate cancer 02/04/2015  . Chronic venous insufficiency 10/12/2012  . Lesion of ulnar nerve 09/03/2012  . Tobacco abuse 04/05/2011  . Hyperlipidemia 01/21/2010  . Essential hypertension 12/16/2009  . Premature beats 12/16/2009     Past Medical History:  Diagnosis Date  . Abnormality of gait 09/03/2012  . Alcohol abuse   . Anxiety   . Arthritis   . Arthritis   . Esophageal stricture   . GERD (gastroesophageal reflux disease)   . Gout   . Hyperlipidemia   . Hypertension   . Internal hemorrhoids   . Lesion of ulnar nerve 09/03/2012   Right ulnar neuropathy  . Murmur, cardiac   . Palpitations   . Prostate cancer ([place]) 2016   treated with radioactive seed implant  . Seizures ([place])    pt states he had seizure 12/14  . Tubular adenoma of colon 03/2008     Past Surgical History:  Procedure Laterality Date  . CATARACT EXTRACTION W/PHACO Right 07/03/2014   Procedure: CATARACT EXTRACTION PHACO AND INTRAOCULAR LENS PLACEMENT (IOC);  Surgeon: Lyla Glassing, MD;  Location: [facility] ORS;  Service: Ophthalmology;  Laterality: Right;  lot # [id number] H [place]: 00:32.3 AP% 9.1 CDE: 2.92  . CIRCUMCISION    . COLONOSCOPY    . ELBOW SURGERY    . ELECTROPHYSIOLOGIC STUDY N/A 11/02/2015   Procedure: CARDIOVERSION;  Surgeon: Minna Merritts, MD;  Location: [facility] ORS;  Service: Cardiovascular;  Laterality: N/A;  . ESOPHAGOGASTRODUODENOSCOPY (EGD) WITH PROPOFOL N/A 05/17/2016   Procedure: ESOPHAGOGASTRODUODENOSCOPY (EGD) WITH PROPOFOL;  Surgeon: Lucilla Lame, MD;  Location: [facility] ENDOSCOPY;  Service: Endoscopy;  Laterality: N/A;  . EYE SURGERY    . HAND SURGERY Left   . SHOULDER SURGERY      Social History   Socioeconomic History  . Marital status: Married    Spouse name: Not on file  . Number of children: 0  . Years of  education: hs  . Highest education level: Not on file  Social Needs  . Financial resource strain: Not on file  . Food insecurity - worry: Not on file  . Food insecurity - inability: Not on file  . Transportation needs - medical: Not on file  . Transportation needs - non-medical: Not on file  Occupational History  . Occupation: Retired    Fish farm manager: Economist  . Occupation: UTILITIES OPER.    Employer: [employer]  Tobacco Use  . Smoking status: Current Every Day Smoker    Years: 24.00    Types: Cigarettes  . Smokeless tobacco: Never Used  . Tobacco comment: down to 2 ciggs a day.  Substance and Sexual Activity  . Alcohol use: Yes    Alcohol/week: 8.4 oz    Types: 14 Cans of beer per week    Comment: 4 beers per night  . Drug use: No    Comment: 1970's occ.   Marland Kitchen Sexual activity: Not on file  Other Topics Concern  . Not on file  Social History Narrative  Lives with wife   Caffeine use: Drinks one cup coffee per morning   No soda or tea     Family History  Problem Relation Age of Onset  . Diabetes Father   . Kidney disease Father   . Lung cancer Father        smoker  . Hyperlipidemia Father   . Hypertension Father   . Lung cancer Mother        smoke  . Hypertension Mother   . Colon cancer Neg Hx   . Pancreatic cancer Neg Hx   . Stomach cancer Neg Hx      Current Outpatient Medications:  .  Alirocumab (PRALUENT) 150 MG/ML SOPN, Inject 1 pen every 14 (fourteen) days into the skin., Disp: 6 pen, Rfl: 3 .  BYSTOLIC 20 MG TABS, TAKE 1 TABLET (20 MG TOTAL) BY MOUTH DAILY., Disp: 90 tablet, Rfl: 2 .  cloNIDine (CATAPRES) 0.1 MG tablet, Take 1 tablet (0.1 mg total) by mouth 2 (two) times daily as needed., Disp: 60 tablet, Rfl: 11 .  colchicine 0.6 MG tablet, Take 0.6 mg by mouth as needed (gout). For gout flare ups, Disp: , Rfl:  .  hydrOXYzine (ATARAX/VISTARIL) 50 MG tablet, Take 50 mg by mouth at bedtime. Reported on 02/04/2015, Disp: , Rfl:  .  lisinopril  (PRINIVIL,ZESTRIL) 40 MG tablet, TAKE 1 TABLET (40 MG TOTAL) BY MOUTH DAILY., Disp: 30 tablet, Rfl: 1 .  milk thistle 175 MG tablet, Take 350 mg by mouth daily. , Disp: , Rfl:  .  Multiple Vitamin (MULTIVITAMIN) tablet, Take 1 tablet by mouth daily. Centrum Plus -Take 1 daily, Disp: , Rfl:  .  tamsulosin (FLOMAX) 0.4 MG CAPS capsule, Take 0.4 mg by mouth daily. , Disp: , Rfl:  .  triamcinolone cream (KENALOG) 0.1 %, Apply 1 application topically daily as needed. , Disp: , Rfl:  .  VOLTAREN 1 % GEL, Apply 2 g topically as needed (knees). , Disp: , Rfl:    Physical exam:  Vitals:   01/27/17 1336  BP: 103/66  Pulse: 98  Resp: 16  Temp: 98.2 F (36.8 C)  TempSrc: Tympanic  Weight: 248 lb (112.5 kg)   Physical Exam  Constitutional: He is oriented to person, place, and time and well-developed, well-nourished, and in no distress.  HENT:  Head: Normocephalic and atraumatic.  Significant bruising noted over the left half of the face extending anteriorly to his neck.  Sub-conjunctival hemorrhage noted.  Eyes: EOM are normal. Pupils are equal, round, and reactive to light.  Neck: Normal range of motion.  Cardiovascular: Normal rate, regular rhythm and normal heart sounds.  Pulmonary/Chest: Effort normal and breath sounds normal.  Abdominal: Soft. Bowel sounds are normal.  No palpable splenomegaly or ascites.  Neurological: He is alert and oriented to person, place, and time.  Patient has resting tremors  Skin: Skin is warm and dry.  Bruising also noted over lower back.  Palmar erythema noted. Asterixis +        CMP Latest Ref Rng & Units 01/23/2017  Glucose 65 - 99 mg/dL 158(H)  BUN 8 - 27 mg/dL 7(L)  Creatinine 0.76 - 1.27 mg/dL 0.98  Sodium 134 - 144 mmol/L 138  Potassium 3.5 - 5.2 mmol/L 4.5  Chloride 96 - 106 mmol/L 102  CO2 20 - 29 mmol/L 20  Calcium 8.6 - 10.2 mg/dL 9.1  Total Protein 6.0 - 8.5 g/dL 6.6  Total Bilirubin 0.0 - 1.2 mg/dL 3.4(H)  Alkaline Phos 39 -  117 IU/L 99   AST 0 - 40 IU/L 100(H)  ALT 0 - 44 IU/L 40   CBC Latest Ref Rng & Units 01/23/2017  WBC 3.4 - 10.8 x10E3/uL 6.6  Hemoglobin 13.0 - 17.7 g/dL 9.7(L)  Hematocrit 37.5 - 51.0 % 26.4(L)  Platelets 150 - 379 x10E3/uL 87(LL)    No images are attached to the encounter.  Ct Head Wo Contrast  Result Date: 01/19/2017 CLINICAL DATA:  Trauma. EXAM: CT HEAD WITHOUT CONTRAST CT CERVICAL SPINE WITHOUT CONTRAST TECHNIQUE: Multidetector CT imaging of the head and cervical spine was performed following the standard protocol without intravenous contrast. Multiplanar CT image reconstructions of the cervical spine were also generated. COMPARISON:  Head CT dated 12/22/2012. FINDINGS: CT HEAD FINDINGS Brain: Generalized age related parenchymal atrophy with commensurate dilatation of the ventricles and sulci. Mild chronic small vessel ischemic change within the deep periventricular white matter regions. No mass, hemorrhage, edema or other evidence of acute parenchymal abnormality. No extra-axial hemorrhage. Vascular: There are chronic calcified atherosclerotic changes of the large vessels at the skull base. No unexpected hyperdense vessel. Skull: Normal. Negative for fracture or focal lesion. Sinuses/Orbits: Mucosal thickening and/or fluid within the right maxillary sinus, of uncertain age. Large soft tissue edema/hematoma overlying the left orbit and left lower frontal bone. No underlying fracture seen. Other: None. CT CERVICAL SPINE FINDINGS Alignment: Mild scoliosis which may be accentuated by patient positioning. No evidence of acute vertebral body subluxation. Skull base and vertebrae: No fracture line or displaced fracture fragment identified. Facet joints appear intact and normally aligned throughout. Soft tissues and spinal canal: No prevertebral fluid or swelling. No visible canal hematoma. Disc levels: Disc desiccations within the lower cervical spine, with associated disc space narrowings and mild osseous  spurring. No more than mild central canal stenosis at any level. Upper chest: Negative. Other: Carotid atherosclerosis. IMPRESSION: 1. Large soft tissue edema/hematoma overlying the left orbit and lower left frontal bone. No underlying fracture or dislocation seen. Left orbital globe appears grossly intact and normal in configuration. No retro-orbital hemorrhage or edema. 2. No acute intracranial abnormality. No intracranial hemorrhage or edema. No skull fracture. 3. No fracture or acute subluxation within the cervical spine. Scoliosis and mild degenerative change. 4. Carotid atherosclerosis. Electronically Signed   By: Franki Cabot M.D.   On: 01/19/2017 19:21   Ct Cervical Spine Wo Contrast  Result Date: 01/19/2017 CLINICAL DATA:  Trauma. EXAM: CT HEAD WITHOUT CONTRAST CT CERVICAL SPINE WITHOUT CONTRAST TECHNIQUE: Multidetector CT imaging of the head and cervical spine was performed following the standard protocol without intravenous contrast. Multiplanar CT image reconstructions of the cervical spine were also generated. COMPARISON:  Head CT dated 12/22/2012. FINDINGS: CT HEAD FINDINGS Brain: Generalized age related parenchymal atrophy with commensurate dilatation of the ventricles and sulci. Mild chronic small vessel ischemic change within the deep periventricular white matter regions. No mass, hemorrhage, edema or other evidence of acute parenchymal abnormality. No extra-axial hemorrhage. Vascular: There are chronic calcified atherosclerotic changes of the large vessels at the skull base. No unexpected hyperdense vessel. Skull: Normal. Negative for fracture or focal lesion. Sinuses/Orbits: Mucosal thickening and/or fluid within the right maxillary sinus, of uncertain age. Large soft tissue edema/hematoma overlying the left orbit and left lower frontal bone. No underlying fracture seen. Other: None. CT CERVICAL SPINE FINDINGS Alignment: Mild scoliosis which may be accentuated by patient positioning. No  evidence of acute vertebral body subluxation. Skull base and vertebrae: No fracture line or displaced fracture fragment identified. Facet joints  appear intact and normally aligned throughout. Soft tissues and spinal canal: No prevertebral fluid or swelling. No visible canal hematoma. Disc levels: Disc desiccations within the lower cervical spine, with associated disc space narrowings and mild osseous spurring. No more than mild central canal stenosis at any level. Upper chest: Negative. Other: Carotid atherosclerosis. IMPRESSION: 1. Large soft tissue edema/hematoma overlying the left orbit and lower left frontal bone. No underlying fracture or dislocation seen. Left orbital globe appears grossly intact and normal in configuration. No retro-orbital hemorrhage or edema. 2. No acute intracranial abnormality. No intracranial hemorrhage or edema. No skull fracture. 3. No fracture or acute subluxation within the cervical spine. Scoliosis and mild degenerative change. 4. Carotid atherosclerosis. Electronically Signed   By: Franki Cabot M.D.   On: 01/19/2017 19:21    Assessment and plan- Patient is a 62 y.o. male referred for evaluation and management of thrombocytopenia  Patient's history of alcohol abuse along with long-standing macrocytosis as well as thrombocytopenia along with elevated bilirubin and low albumin, elevated PT/INR and elevated AST more than ALT all point out to alcohol associated chronic liver disease and secondary anemia and thrombocytopenia  Today I will check cbc, cmp, fractionated bilirubin, haptoglobin, retic count, iron studies, b12 and folate, smear review, TSH and myeloma panel and serum free light chains. I will also check for Hep C ab, Hep B surface antigen, surface antibody and core antibody. Recent HIV antibody testing was negative.  I will also obtain US abdomen to evaluate for chronic liver disease and splenomegaly. Referral to GI already in place for evaluation of chronic liver  disease. Last MRI abdomen from 2015 showed hepatic steatosis. Unclear if he has overt cirrhosis at this time.  I will see him back in 2 weeks time to discuss results of his blood work and further management.    Thank you for this kind referral and the opportunity to participate in the care of this patient   Visit Diagnosis 1. Macrocytic anemia   2. Thrombocytopenia ([place])   3. Alcohol abuse   4. Abnormal LFTs     Dr. Randa Evens, MD, MPH [facility] Pager- [phone] 01/27/2017  4:03 PM

## 2017-01-28 NOTE — Progress Notes (Signed)
Cardiology Office Note  Date:  01/30/2017   ID:  Peter [name], DOB 1955/04/22, MRN [id number]  PCP:  Peter Haven, MD   Chief Complaint  Patient presents with  . other    Syncope. Meds reviewed verbally with pt.    HPI:  Peter [name] is a 62 year old [name] with  long history of smoking,  hypertension,  daily alcohol drinking,  prostate cancer ,  statin intolerance ,    underlying liver dysfunction, elevated total bilirubin CAD, CT coronary calcium score 2778 in 04/2015 Stress test 07/2014 no ischemia Cardioversion 11/02/2015 For atrial flutter tolerating praluent , dramatic improvement in his cholesterol down to 160 from 260 who presents for follow-up of his coronary artery disease, hypertension, hyperlipidemia  Recent episode of fall, possible loss of consciousness Was drinking alcohol at home, took clonazepam (this was provided for tremor) Passed out, hit his face on the left Was in the hospital, blood thinners held overnight 01/19/2017 Was recommended he hold anticoagulation until platelets improved  On today's visit he does not feel well, Persistent bruising left side of his face has been slow to recover.  Anemic, lab work reviewed with him, hemoglobin of 9 down from close to 13   No appetitie  reports losing some blood after sinus surgery  On lisinopril, bystolic  blood pressure running high Reports he could not tolerate Cardura 5 or 8 mg, made his blood pressure low Stop the medications altogether  Echo January 2019  EF normal >65%  Previous EGD with Dr. Allen [name],  Essentially normal procedure, no sign of bleeding  No regular exercise program Denies any chest pain concerning for angina  EKG personally reviewed by myself on todays visit Shows normal sinus rhythm rate 65 bpm no significant ST or T-wave changes  Other past medical history reviewed echocardiogram 02/2016 showing normal LV function  Chronic back pain He went to the ER for back pain  11/22/2015 Started On tramadol PRN  Limited by back pain , was told he could get a "shot" "shaking" for 2 years since prostate seeds, stopped librium Still with heavy ETOH, previous notes indicating 4-5 beers per day Frequent nocturia Has been taking lisinopril 40 mg x 2, presumably because blood pressure was running high, now running out of his medication. Did not call our office  Previous stress test July 2016 showing no ischemia CT scan of the chest, CT coronary calcium score discussed with him in detail, images pulled up in the office. His scores close to 3000. He denies any anginal symptoms. Images show diffuse heavy calcification/calcified atherosclerosis in the LAD, diagonal branch, ostial and proximal RCA. No significant disease noted in the left circumflex.   he reports being debilitated when he was on Crestor, Lipitor He had diffuse muscle pain in his legs, has tried different doses, does not want to retry a statin given the debilitating side effects. Feels it made it impossible to get around, on top of his arthritis, severe knee pain, it was just too much  CT scan of the abdomen shows scattered diffuse mild aortic atherosclerosis.  Lab work reviewed with him showing hemoglobin A1c 5.5 , total cholesterol more than 230   PMH:   has a past medical history of Abnormality of gait (09/03/2012), Alcohol abuse, Anxiety, Arthritis, Arthritis, Esophageal stricture, GERD (gastroesophageal reflux disease), Gout, Hyperlipidemia, Hypertension, Internal hemorrhoids, Lesion of ulnar nerve (09/03/2012), Murmur, cardiac, Palpitations, Prostate cancer ([place]) (2016), Seizures ([place]), and Tubular adenoma of colon (03/2008).  PSH:    Past  Surgical History:  Procedure Laterality Date  . CATARACT EXTRACTION W/PHACO Right 07/03/2014   Procedure: CATARACT EXTRACTION PHACO AND INTRAOCULAR LENS PLACEMENT (IOC);  Surgeon: Peter Glassing, MD;  Location: [facility] ORS;  Service: Ophthalmology;  Laterality:  Right;  lot # [id number] H [place]: 00:32.3 AP% 9.1 CDE: 2.92  . CIRCUMCISION    . COLONOSCOPY    . ELBOW SURGERY    . ELECTROPHYSIOLOGIC STUDY N/A 11/02/2015   Procedure: CARDIOVERSION;  Surgeon: Peter Merritts, MD;  Location: [facility] OR[facility];  Service: Cardiovascular;  Laterality: N/A;  . ESOPHAGOGASTRODUODENOSCOPY (EGD) WITH PROPOFOL N/A 05/17/2016   Procedure: ESOPHAGOGASTRODUODENOSCOPY (EGD) WITH PROPOFOL;  Surgeon: Peter Lame, MD;  Location: [facility]COPY;  Service: Endoscopy;  Laterality: N/A;  . EYE SURGERY    . HAND SURGERY Left   . SHOULDER SURGERY      Current Outpatient Medications  Medication Sig Dispense Refill  . Alirocumab (PRALUENT) 150 MG/ML SOPN Inject 1 pen every 14 (fourteen) days into the skin. 6 pen 3  . BYSTOLIC 20 MG TABS TAKE 1 TABLET (20 MG TOTAL) BY MOUTH DAILY. 90 tablet 2  . colchicine 0.6 MG tablet Take 0.6 mg by mouth as needed (gout). For gout flare ups    . hydrOXYzine (ATARAX/VISTARIL) 50 MG tablet Take 50 mg by mouth at bedtime. Reported on 02/04/2015    . lisinopril (PRINIVIL,ZESTRIL) 40 MG tablet TAKE 1 TABLET (40 MG TOTAL) BY MOUTH DAILY. 30 tablet 1  . milk thistle 175 MG tablet Take 350 mg by mouth daily.     . Multiple Vitamin (MULTIVITAMIN) tablet Take 1 tablet by mouth daily. Centrum Plus -Take 1 daily    . omeprazole (PRILOSEC) 20 MG capsule Take 20 mg by mouth daily.    . tamsulosin (FLOMAX) 0.4 MG CAPS capsule Take 0.4 mg by mouth daily.     Marland Kitchen triamcinolone cream (KENALOG) 0.1 % Apply 1 application topically daily as needed.     . VOLTAREN 1 % GEL Apply 2 g topically as needed (knees).      No current facility-administered medications for this visit.      Allergies:   Clonazepam; Crestor [rosuvastatin]; Dust mite extract; Hydrocodone bitartrate er; Lipitor [atorvastatin]; and Topamax [topiramate]   Social History:  The patient  reports that he has been smoking cigarettes.  He has smoked for the past 24.00 years. he has never used smokeless tobacco.  He reports that he drinks about 8.4 oz of alcohol per week. He reports that he does not use drugs.   Family History:   family history includes Diabetes in his father; Hyperlipidemia in his father; Hypertension in his father and mother; Kidney disease in his father; Lung cancer in his father and mother.    Review of Systems: Review of Systems  Constitutional: Positive for malaise/fatigue.  Respiratory: Negative.   Cardiovascular: Negative.   Gastrointestinal: Negative.   Musculoskeletal: Negative.   Skin:       Ecchymosis left side of face  Psychiatric/Behavioral: The patient has insomnia.   All other systems reviewed and are negative.    PHYSICAL EXAM: VS:  BP (!) 172/80 (BP Location: Left Arm, Patient Position: Sitting, Cuff Size: Normal)   Pulse 60   Ht 6' (1.829 m)   Wt 247 lb 4 oz (112.2 kg)   BMI 33.53 kg/m  , BMI Body mass index is 33.53 kg/m. No change from previous exam GEN: Well nourished, well developed, in no acute distress , obese.mild tremor HEENT: normal  Neck: no JVD, carotid bruits,  or masses Cardiac: RRR; 1-2+ SEM RSB murmurs, rubs, or gallops,no edema  Respiratory:  clear to auscultation bilaterally, normal work of breathing GI: soft, nontender, nondistended, + BS MS: no deformity or atrophy  Skin: warm and dry, no rash Neuro:  Strength and sensation are intact Psych: euthymic mood, full affect    Recent Labs: 03/23/2016: TSH 1.420 01/23/2017: ALT 40; BUN 7; Creatinine, Ser 0.98; Hemoglobin 9.7; Platelets 87; Potassium 4.5; Sodium 138    Lipid Panel Lab Results  Component Value Date   CHOL 161 11/20/2015   HDL 46 11/20/2015   LDLCALC 86 11/20/2015   TRIG 143 11/20/2015      Wt Readings from Last 3 Encounters:  01/30/17 247 lb 4 oz (112.2 kg)  01/27/17 248 lb (112.5 kg)  01/23/17 249 lb 9.6 oz (113.2 kg)       ASSESSMENT AND PLAN:   Essential hypertension - Plan: EKG 12-Lead Long discussion concerning his blood pressure Would avoid  clonidine given symptoms of fatigue  Previous history of leg edema, will avoid calcium channel blockers Did not tolerate isosorbide in the past Cardura 4-8 mg made his blood pressure running low  Will recommended he take Cardura 1-2 mg twice a day   Pure hypercholesterolemia - Plan: EKG 12-Lead Tolerating the shot, praluent Goal LDL less than 70   Typical atrial flutter (HCC) - Plan: EKG 12-Lead Maintaining normal sinus rhythm   will restart anticoagulation once blood count has improved Scheduled to see hematology in 2 weeks  Coronary artery disease involving native coronary artery of native heart without angina pectoris - Plan: EKG 12-Lead Currently with no symptoms of angina. No further workup at this time. Continue current medication regimen. stable   Anxiety/fatigue/depression insomnia Previously was on Ambien  Alcohol use Several per day Unclear if this is contribute to his tremor May be affecting his sleep  Smoker We have encouraged him to continue to work on weaning his cigarettes and smoking cessation. He will continue to work on this and does not want any assistance with chantix.    Total encounter time more than 25 minutes  Greater than 50% was spent in counseling and coordination of care with the patient   Disposition:   F/U  6 months   Orders Placed This Encounter  Procedures  . EKG 12-Lead     Signed, Esmond Plants, M.D., Ph.D. 01/30/2017  [place], [place]

## 2017-01-30 ENCOUNTER — Encounter: Payer: Self-pay | Admitting: Cardiovascular Disease

## 2017-01-30 ENCOUNTER — Ambulatory Visit: Payer: 59 | Admitting: Cardiovascular Disease

## 2017-01-30 VITALS — BP 172/80 | HR 60 | Ht 72.0 in | Wt 247.2 lb

## 2017-01-30 DIAGNOSIS — I10 Essential (primary) hypertension: Secondary | ICD-10-CM | POA: Diagnosis not present

## 2017-01-30 DIAGNOSIS — I483 Typical atrial flutter: Secondary | ICD-10-CM

## 2017-01-30 DIAGNOSIS — I25118 Atherosclerotic heart disease of native coronary artery with other forms of angina pectoris: Secondary | ICD-10-CM

## 2017-01-30 DIAGNOSIS — I700 Atherosclerosis of aorta: Secondary | ICD-10-CM | POA: Diagnosis not present

## 2017-01-30 DIAGNOSIS — Z720 Tobacco use: Secondary | ICD-10-CM

## 2017-01-30 DIAGNOSIS — F1010 Alcohol abuse, uncomplicated: Secondary | ICD-10-CM | POA: Diagnosis not present

## 2017-01-30 DIAGNOSIS — K700 Alcoholic fatty liver: Secondary | ICD-10-CM

## 2017-01-30 DIAGNOSIS — I872 Venous insufficiency (chronic) (peripheral): Secondary | ICD-10-CM

## 2017-01-30 DIAGNOSIS — Z87898 Personal history of other specified conditions: Secondary | ICD-10-CM | POA: Diagnosis not present

## 2017-01-30 DIAGNOSIS — E7800 Pure hypercholesterolemia, unspecified: Secondary | ICD-10-CM | POA: Diagnosis not present

## 2017-01-30 DIAGNOSIS — F1011 Alcohol abuse, in remission: Secondary | ICD-10-CM

## 2017-01-30 MED ORDER — DOXAZOSIN MESYLATE 2 MG PO TABS: 2.0000 mg | ORAL_TABLET | Freq: Every day | ORAL | 6 refills | Status: DC

## 2017-01-30 NOTE — Patient Instructions (Signed)
Medication Instructions:   No medication changes made  Stop clonidine  Try doxazosin/cardura 1/4 pill once or twice a day for blood pressure   Labwork:  No new labs needed  Testing/Procedures:  No further testing at this time   Follow-Up: It was a pleasure seeing you in the office today. Please call us if you have new issues that need to be addressed before your next appt.  [phone]  Your physician wants you to follow-up in: 6 months.  You will receive a reminder letter in the mail two months in advance. If you don't receive a letter, please call our office to schedule the follow-up appointment.  If you need a refill on your cardiac medications before your next appointment, please call your pharmacy.

## 2017-02-02 ENCOUNTER — Ambulatory Visit: Payer: 59

## 2017-02-02 DIAGNOSIS — R233 Spontaneous ecchymoses: Secondary | ICD-10-CM | POA: Diagnosis not present

## 2017-02-02 DIAGNOSIS — D696 Thrombocytopenia, unspecified: Secondary | ICD-10-CM | POA: Diagnosis not present

## 2017-02-03 LAB — COMPREHENSIVE METABOLIC PANEL
ALT: 28 IU/L (ref 0–44)
AST: 92 IU/L — AB (ref 0–40)
Albumin/Globulin Ratio: 1.1 — ABNORMAL LOW (ref 1.2–2.2)
Albumin: 3.6 g/dL (ref 3.6–4.8)
Alkaline Phosphatase: 96 IU/L (ref 39–117)
BILIRUBIN TOTAL: 3.8 mg/dL — AB (ref 0.0–1.2)
BUN/Creatinine Ratio: 10 (ref 10–24)
BUN: 8 mg/dL (ref 8–27)
CHLORIDE: 97 mmol/L (ref 96–106)
CO2: 17 mmol/L — ABNORMAL LOW (ref 20–29)
Calcium: 9.2 mg/dL (ref 8.6–10.2)
Creatinine, Ser: 0.82 mg/dL (ref 0.76–1.27)
GFR calc Af Amer: 111 mL/min/{1.73_m2} (ref >59)
GFR calc non Af Amer: 96 mL/min/{1.73_m2} (ref >59)
Globulin, Total: 3.3 g/dL (ref 1.5–4.5)
Glucose: 123 mg/dL — ABNORMAL HIGH (ref 65–99)
POTASSIUM: 4.6 mmol/L (ref 3.5–5.2)
Sodium: 135 mmol/L (ref 134–144)
Total Protein: 6.9 g/dL (ref 6.0–8.5)

## 2017-02-04 ENCOUNTER — Other Ambulatory Visit: Payer: Self-pay | Admitting: Family Medicine

## 2017-02-04 DIAGNOSIS — E878 Other disorders of electrolyte and fluid balance, not elsewhere classified: Secondary | ICD-10-CM

## 2017-02-06 ENCOUNTER — Telehealth: Payer: Self-pay

## 2017-02-06 NOTE — Telephone Encounter (Signed)
Left message to notify orders are faxed

## 2017-02-06 NOTE — Telephone Encounter (Signed)
Copied from [place]. Topic: General - Other >> Feb 06, 2017  9:08 AM Yvette Rack wrote: Reason for CRM: patient calling stating that someone was suppose to have faxed over orders to [facility] fax number is [phone] pt can get in today or tomorrow if orders are sent over

## 2017-02-06 NOTE — Telephone Encounter (Signed)
Please write order.

## 2017-02-06 NOTE — Telephone Encounter (Signed)
Completed.

## 2017-02-07 ENCOUNTER — Encounter: Payer: Self-pay | Admitting: Neurology

## 2017-02-07 ENCOUNTER — Ambulatory Visit
Admission: RE | Admit: 2017-02-07 | Discharge: 2017-02-07 | Disposition: A | Discharge status: Home / Self Care | Payer: 59 | Source: Ambulatory Visit | Attending: Oncology | Admitting: Oncology

## 2017-02-07 DIAGNOSIS — K829 Disease of gallbladder, unspecified: Secondary | ICD-10-CM | POA: Insufficient documentation

## 2017-02-07 DIAGNOSIS — I868 Varicose veins of other specified sites: Secondary | ICD-10-CM | POA: Insufficient documentation

## 2017-02-07 DIAGNOSIS — R161 Splenomegaly, not elsewhere classified: Secondary | ICD-10-CM | POA: Diagnosis not present

## 2017-02-07 DIAGNOSIS — K769 Liver disease, unspecified: Secondary | ICD-10-CM | POA: Insufficient documentation

## 2017-02-07 DIAGNOSIS — K766 Portal hypertension: Secondary | ICD-10-CM | POA: Diagnosis not present

## 2017-02-07 DIAGNOSIS — R162 Hepatomegaly with splenomegaly, not elsewhere classified: Secondary | ICD-10-CM

## 2017-02-08 ENCOUNTER — Telehealth: Payer: Self-pay

## 2017-02-08 DIAGNOSIS — J301 Allergic rhinitis due to pollen: Secondary | ICD-10-CM | POA: Diagnosis not present

## 2017-02-08 NOTE — Telephone Encounter (Signed)
Copied from [place]. Topic: General - Other >> Feb 06, 2017  9:08 AM Yvette Rack wrote: Reason for CRM: patient calling stating that someone was suppose to have faxed over orders to [facility] fax number is [phone] pt can get in today or tomorrow if orders are sent over  >> Feb 08, 2017  2:24 PM Ivar Drape wrote: Patient wants clarification on the orders for [facility].  Please call him at [phone]

## 2017-02-09 ENCOUNTER — Other Ambulatory Visit: Payer: Self-pay | Admitting: *Deleted

## 2017-02-09 NOTE — Telephone Encounter (Signed)
Left message to notify patient we have faxed the order to [facility]

## 2017-02-13 ENCOUNTER — Other Ambulatory Visit: Payer: Self-pay | Admitting: Family Medicine

## 2017-02-15 ENCOUNTER — Telehealth: Payer: Self-pay | Admitting: Neurology

## 2017-02-15 NOTE — Telephone Encounter (Signed)
Pt is saying he is happy with our care, but is wanting to let us know that our reschedule letter is "intemidating and unacceptable or user friendly " said that if anyone wanted to call and discuss his feedback he would be more than happy  FYI

## 2017-02-15 NOTE — Telephone Encounter (Signed)
Angie- is there anything you need to do with this?

## 2017-02-15 NOTE — Telephone Encounter (Signed)
sonn[place] pt     

## 2017-02-16 ENCOUNTER — Inpatient Hospital Stay: Payer: 59 | Attending: Oncology | Admitting: Oncology

## 2017-02-16 ENCOUNTER — Telehealth: Payer: Self-pay

## 2017-02-16 ENCOUNTER — Encounter: Payer: Self-pay | Admitting: Oncology

## 2017-02-16 VITALS — BP 126/90 | HR 137 | Temp 98.4°F | Ht 72.0 in | Wt 244.4 lb

## 2017-02-16 DIAGNOSIS — D509 Iron deficiency anemia, unspecified: Secondary | ICD-10-CM | POA: Diagnosis not present

## 2017-02-16 DIAGNOSIS — Z720 Tobacco use: Secondary | ICD-10-CM

## 2017-02-16 DIAGNOSIS — K709 Alcoholic liver disease, unspecified: Secondary | ICD-10-CM | POA: Diagnosis not present

## 2017-02-16 DIAGNOSIS — J301 Allergic rhinitis due to pollen: Secondary | ICD-10-CM | POA: Diagnosis not present

## 2017-02-16 DIAGNOSIS — D539 Nutritional anemia, unspecified: Secondary | ICD-10-CM

## 2017-02-16 DIAGNOSIS — D696 Thrombocytopenia, unspecified: Secondary | ICD-10-CM | POA: Diagnosis not present

## 2017-02-16 MED ORDER — FOLIC ACID 1 MG PO TABS: 1.0000 mg | ORAL_TABLET | Freq: Every day | ORAL | 1 refills | Status: DC

## 2017-02-16 NOTE — Progress Notes (Signed)
No new changes noted with this visit.

## 2017-02-16 NOTE — Telephone Encounter (Signed)
Please advise on refill.

## 2017-02-16 NOTE — Telephone Encounter (Signed)
Copied from [place]. Topic: Quick Communication - Office Called Patient >> Feb 16, 2017  8:35 AM Peter [name], CMA wrote: Reason for EZB:MZTAEWYB pharmacy is requesting refill on tramadol hcl 50 mg tablet   Left message to return call to ask patient if he requested this and how much and  often he takes this. [place] for pec to speak with patient >> Feb 16, 2017  3:33 PM Yvette Rack wrote: Patient states that he do need a refill on tramadol hcl 50 mg tablet and that he takes it PRN maybe 4 pills a week and he only have a few pills left

## 2017-02-16 NOTE — Telephone Encounter (Signed)
Patients pharmacy is requesting refill on tramadol hcl 50 mg tablet   Left message to return call to ask patient if he requested this and how much and  often he takes this. [place] for pec to speak with patient

## 2017-02-16 NOTE — Telephone Encounter (Signed)
Noted. What does he take this for? It also appears that this was discontinued in his chart related to him not taking this anymore.

## 2017-02-17 NOTE — Telephone Encounter (Signed)
Last office visit 01/23/17 Next office visit 04/24/17 Last refill 08/09/16

## 2017-02-17 NOTE — Telephone Encounter (Signed)
Left message to return call, ok for pec to speak with patient and ask what he takes this for

## 2017-02-17 NOTE — Telephone Encounter (Signed)
Please see the other phone note.  I want to know what he takes this for.  It appears that this was discontinued previously.  Thanks.

## 2017-02-17 NOTE — Progress Notes (Signed)
Hematology/Oncology Consult note [facility]  Telephone:([phone] [phone]  Patient Care Team: [name], MD as PCP - General (Family Medicine) Garald Balding, MD as Consulting Physician (Orthopedic Surgery) Minna Merritts, MD as Consulting Physician (Cardiology)   Name of the patient: Peter [name]  [id number]  [birth year]   Date of visit: 02/17/17  Diagnosis- macrocytic anemia and thrombocytopenia secondary to alcohol dependence and chronic liver disease  Chief complaint/ Reason for visit- discuss results of bloodwork  Heme/Onc history: patient is a 62 year old male who has been referred to [place] for evaluation and management of thrombocytopenia.  Recent CBC from 01/23/2017 revealed a white count of 6.6, H&H of 9.7/26.4 with an MCV of 104 and a platelet count of 87.  Of note patient has had mild to moderate thrombocytopenia since 2014.  In 2014 his platelet count was 141 and drifted down to the 130s in 2017.  Since May 2018 his platelet counts have been fluctuating between 50-80.  Patient is also had long-standing macrocytosis and his MCV typically ranges from 104- 112.  On reviewing his CMP patient's recent, total bilirubin was elevated at 3.4, AST elevated at 100.  Since January 2017 he has had an elevated total bilirubin recent albumin was mildly low at 3.5.  Last MRI abdomen from 2015 had shown mild hepatic steatosis  Patient had a fall on 01/19/2017 for unclear reasons.  He was started on Klonopin by his neurologist for his tremors and patient states that he took a dose of Klonopin followed by 3 beers and he passed out on the kitchen floor and had to be hospitalized.  He still has significant bruising over the left half of his face as well as in front of his neck as well as subconjunctival hemorrhage.  Patient admits to drinking alcohol and says that he was a heavy drinker since the age of 11 but has slowed down recently.  In the  past he used to drink 12 packs of beer and now he is down to 3-6 packs of beer per day.  Patient has seen [facility] in the past and has undergone EGD and colonoscopy.  Recent upper endoscopy was in May 2018 which was done for melena and was normal.   Interval history- bruising around eyes and enck is improving. Denies any recurrent falls  ECOG PS- 1 Pain scale- 0   Review of systems- Review of Systems  Constitutional: Positive for malaise/fatigue. Negative for chills, fever and weight loss.  HENT: Negative for congestion, ear discharge and nosebleeds.   Eyes: Negative for blurred vision.  Respiratory: Negative for cough, hemoptysis, sputum production, shortness of breath and wheezing.   Cardiovascular: Negative for chest pain, palpitations, orthopnea and claudication.  Gastrointestinal: Negative for abdominal pain, blood in stool, constipation, diarrhea, heartburn, melena, nausea and vomiting.  Genitourinary: Negative for dysuria, flank pain, frequency, hematuria and urgency.  Musculoskeletal: Negative for back pain, joint pain and myalgias.  Skin: Negative for rash.  Neurological: Negative for dizziness, tingling, focal weakness, seizures, weakness and headaches.  Endo/Heme/Allergies: Bruises/bleeds easily.  Psychiatric/Behavioral: Negative for depression and suicidal ideas. The patient does not have insomnia.       Allergies  Allergen Reactions  . Clonazepam     Syncope  . Crestor [Rosuvastatin] Other (See Comments)    Bloating, swelling of legs, fatty liver  . Dust Mite Extract   . Hydrocodone Bitartrate Er   . Lipitor [Atorvastatin] Other (See Comments)    Myalgias  .  Topamax [Topiramate]     Cognitive clouding     Past Medical History:  Diagnosis Date  . Abnormality of gait 09/03/2012  . Alcohol abuse   . Anxiety   . Arthritis   . Arthritis   . Esophageal stricture   . GERD (gastroesophageal reflux disease)   . Gout   . Hyperlipidemia   . Hypertension   .  Internal hemorrhoids   . Lesion of ulnar nerve 09/03/2012   Right ulnar neuropathy  . Murmur, cardiac   . Palpitations   . Prostate cancer ([place]) 2016   treated with radioactive seed implant  . Seizures ([place])    pt states he had seizure 12/14  . Tubular adenoma of colon 03/2008     Past Surgical History:  Procedure Laterality Date  . CATARACT EXTRACTION W/PHACO Right 07/03/2014   Procedure: CATARACT EXTRACTION PHACO AND INTRAOCULAR LENS PLACEMENT (IOC);  Surgeon: Lyla Glassing, MD;  Location: [facility] ORS;  Service: Ophthalmology;  Laterality: Right;  lot # [id number] H [place]: 00:32.3 AP% 9.1 CDE: 2.92  . CIRCUMCISION    . COLONOSCOPY    . ELBOW SURGERY    . ELECTROPHYSIOLOGIC STUDY N/A 11/02/2015   Procedure: CARDIOVERSION;  Surgeon: Minna Merritts, MD;  Location: [facility] ORS;  Service: Cardiovascular;  Laterality: N/A;  . ESOPHAGOGASTRODUODENOSCOPY (EGD) WITH PROPOFOL N/A 05/17/2016   Procedure: ESOPHAGOGASTRODUODENOSCOPY (EGD) WITH PROPOFOL;  Surgeon: Lucilla Lame, MD;  Location: [facility] ENDOSCOPY;  Service: Endoscopy;  Laterality: N/A;  . EYE SURGERY    . HAND SURGERY Left   . SHOULDER SURGERY      Social History   Socioeconomic History  . Marital status: Married    Spouse name: Not on file  . Number of children: 0  . Years of education: hs  . Highest education level: Not on file  Social Needs  . Financial resource strain: Not on file  . Food insecurity - worry: Not on file  . Food insecurity - inability: Not on file  . Transportation needs - medical: Not on file  . Transportation needs - non-medical: Not on file  Occupational History  . Occupation: Retired    Fish farm manager: Economist  . Occupation: UTILITIES OPER.    Employer: [employer]  Tobacco Use  . Smoking status: Current Every Day Smoker    Years: 24.00    Types: Cigarettes  . Smokeless tobacco: Never Used  . Tobacco comment: down to 2 ciggs a day.  Substance and Sexual Activity  . Alcohol use: Yes    Alcohol/week: 8.4  oz    Types: 14 Cans of beer per week    Comment: 4 beers per night  . Drug use: No    Comment: 1970's occ.   Marland Kitchen Sexual activity: Not on file  Other Topics Concern  . Not on file  Social History Narrative   Lives with wife   Caffeine use: Drinks one cup coffee per morning   No soda or tea    Family History  Problem Relation Age of Onset  . Diabetes Father   . Kidney disease Father   . Lung cancer Father        smoker  . Hyperlipidemia Father   . Hypertension Father   . Lung cancer Mother        smoke  . Hypertension Mother   . Colon cancer Neg Hx   . Pancreatic cancer Neg Hx   . Stomach cancer Neg Hx      Current Outpatient Medications:  .  Alirocumab (PRALUENT) 150 MG/ML SOPN, Inject 1 pen every 14 (fourteen) days into the skin., Disp: 6 pen, Rfl: 3 .  BYSTOLIC 20 MG TABS, TAKE 1 TABLET (20 MG TOTAL) BY MOUTH DAILY., Disp: 90 tablet, Rfl: 2 .  colchicine 0.6 MG tablet, Take 0.6 mg by mouth as needed (gout). For gout flare ups, Disp: , Rfl:  .  doxazosin (CARDURA) 2 MG tablet, Take 1 tablet (2 mg total) by mouth daily., Disp: 60 tablet, Rfl: 6 .  hydrOXYzine (ATARAX/VISTARIL) 50 MG tablet, Take 50 mg by mouth at bedtime. Reported on 02/04/2015, Disp: , Rfl:  .  lisinopril (PRINIVIL,ZESTRIL) 40 MG tablet, TAKE 1 TABLET (40 MG TOTAL) BY MOUTH DAILY., Disp: 30 tablet, Rfl: 1 .  milk thistle 175 MG tablet, Take 350 mg by mouth daily. , Disp: , Rfl:  .  Multiple Vitamin (MULTIVITAMIN) tablet, Take 1 tablet by mouth daily. Centrum Plus -Take 1 daily, Disp: , Rfl:  .  omeprazole (PRILOSEC) 20 MG capsule, Take 20 mg by mouth daily., Disp: , Rfl:  .  tamsulosin (FLOMAX) 0.4 MG CAPS capsule, Take 0.4 mg by mouth daily. , Disp: , Rfl:  .  triamcinolone cream (KENALOG) 0.1 %, Apply 1 application topically daily as needed. , Disp: , Rfl:  .  VOLTAREN 1 % GEL, Apply 2 g topically as needed (knees). , Disp: , Rfl:  .  folic acid (FOLVITE) 1 MG tablet, Take 1 tablet (1 mg total) by mouth  daily., Disp: 30 tablet, Rfl: 1  Physical exam:  Vitals:   02/16/17 1356  BP: 126/90  Pulse: (!) 137  Temp: 98.4 F (36.9 C)  TempSrc: Tympanic  Weight: 244 lb 6.4 oz (110.9 kg)  Height: 6' (1.829 m)   Physical Exam  Constitutional: He is oriented to person, place, and time and well-developed, well-nourished, and in no distress.  HENT:  Head: Normocephalic and atraumatic.  Eyes: EOM are normal. Pupils are equal, round, and reactive to light.  Neck: Normal range of motion.  Cardiovascular: Normal rate, regular rhythm and normal heart sounds.  Pulmonary/Chest: Effort normal and breath sounds normal.  Abdominal: Soft. Bowel sounds are normal.  Neurological: He is alert and oriented to person, place, and time.  Skin: Skin is warm and dry.     CMP Latest Ref Rng & Units 02/02/2017  Glucose 65 - 99 mg/dL 123(H)  BUN 8 - 27 mg/dL 8  Creatinine 0.76 - 1.27 mg/dL 0.82  Sodium 134 - 144 mmol/L 135  Potassium 3.5 - 5.2 mmol/L 4.6  Chloride 96 - 106 mmol/L 97  CO2 20 - 29 mmol/L 17(L)  Calcium 8.6 - 10.2 mg/dL 9.2  Total Protein 6.0 - 8.5 g/dL 6.9  Total Bilirubin 0.0 - 1.2 mg/dL 3.8(H)  Alkaline Phos 39 - 117 IU/L 96  AST 0 - 40 IU/L 92(H)  ALT 0 - 44 IU/L 28   CBC Latest Ref Rng & Units 01/23/2017  WBC 3.4 - 10.8 x10E3/uL 6.6  Hemoglobin 13.0 - 17.7 g/dL 9.7(L)  Hematocrit 37.5 - 51.0 % 26.4(L)  Platelets 150 - 379 x10E3/uL 87(LL)    Results of blood work from 02/02/2017 were as follows: CBC showed white count of 6.4, H&H of 10.6/28.5 with an MCV of 106 and a platelet count of 73.  Use eardrops along with ovalocytes and target cells were present.  CMP was significant for a total bilirubin of 3.7 with a direct bilirubin of 1.2 and indirect bilirubin of 2.4.  TSH was normal.  Folate was normal at 10.6.  B12 was greater than 2000.  Haptoglobin was less than 10.  Ferritin was elevated at 589.  Reticulocyte count was 5.1%.  Hepatitis B testing was negative.  AST was elevated at 89 ALT  was normal at 37.  Albumin was low at 3.5.  Iron saturation was high at 65%.  Ct Head Wo Contrast  Result Date: 01/19/2017 CLINICAL DATA:  Trauma. EXAM: CT HEAD WITHOUT CONTRAST CT CERVICAL SPINE WITHOUT CONTRAST TECHNIQUE: Multidetector CT imaging of the head and cervical spine was performed following the standard protocol without intravenous contrast. Multiplanar CT image reconstructions of the cervical spine were also generated. COMPARISON:  Head CT dated 12/22/2012. FINDINGS: CT HEAD FINDINGS Brain: Generalized age related parenchymal atrophy with commensurate dilatation of the ventricles and sulci. Mild chronic small vessel ischemic change within the deep periventricular white matter regions. No mass, hemorrhage, edema or other evidence of acute parenchymal abnormality. No extra-axial hemorrhage. Vascular: There are chronic calcified atherosclerotic changes of the large vessels at the skull base. No unexpected hyperdense vessel. Skull: Normal. Negative for fracture or focal lesion. Sinuses/Orbits: Mucosal thickening and/or fluid within the right maxillary sinus, of uncertain age. Large soft tissue edema/hematoma overlying the left orbit and left lower frontal bone. No underlying fracture seen. Other: None. CT CERVICAL SPINE FINDINGS Alignment: Mild scoliosis which may be accentuated by patient positioning. No evidence of acute vertebral body subluxation. Skull base and vertebrae: No fracture line or displaced fracture fragment identified. Facet joints appear intact and normally aligned throughout. Soft tissues and spinal canal: No prevertebral fluid or swelling. No visible canal hematoma. Disc levels: Disc desiccations within the lower cervical spine, with associated disc space narrowings and mild osseous spurring. No more than mild central canal stenosis at any level. Upper chest: Negative. Other: Carotid atherosclerosis. IMPRESSION: 1. Large soft tissue edema/hematoma overlying the left orbit and lower  left frontal bone. No underlying fracture or dislocation seen. Left orbital globe appears grossly intact and normal in configuration. No retro-orbital hemorrhage or edema. 2. No acute intracranial abnormality. No intracranial hemorrhage or edema. No skull fracture. 3. No fracture or acute subluxation within the cervical spine. Scoliosis and mild degenerative change. 4. Carotid atherosclerosis. Electronically Signed   By: Franki Cabot M.D.   On: 01/19/2017 19:21   Ct Cervical Spine Wo Contrast  Result Date: 01/19/2017 CLINICAL DATA:  Trauma. EXAM: CT HEAD WITHOUT CONTRAST CT CERVICAL SPINE WITHOUT CONTRAST TECHNIQUE: Multidetector CT imaging of the head and cervical spine was performed following the standard protocol without intravenous contrast. Multiplanar CT image reconstructions of the cervical spine were also generated. COMPARISON:  Head CT dated 12/22/2012. FINDINGS: CT HEAD FINDINGS Brain: Generalized age related parenchymal atrophy with commensurate dilatation of the ventricles and sulci. Mild chronic small vessel ischemic change within the deep periventricular white matter regions. No mass, hemorrhage, edema or other evidence of acute parenchymal abnormality. No extra-axial hemorrhage. Vascular: There are chronic calcified atherosclerotic changes of the large vessels at the skull base. No unexpected hyperdense vessel. Skull: Normal. Negative for fracture or focal lesion. Sinuses/Orbits: Mucosal thickening and/or fluid within the right maxillary sinus, of uncertain age. Large soft tissue edema/hematoma overlying the left orbit and left lower frontal bone. No underlying fracture seen. Other: None. CT CERVICAL SPINE FINDINGS Alignment: Mild scoliosis which may be accentuated by patient positioning. No evidence of acute vertebral body subluxation. Skull base and vertebrae: No fracture line or displaced fracture fragment identified. Facet joints appear intact and normally aligned throughout. Soft  tissues and  spinal canal: No prevertebral fluid or swelling. No visible canal hematoma. Disc levels: Disc desiccations within the lower cervical spine, with associated disc space narrowings and mild osseous spurring. No more than mild central canal stenosis at any level. Upper chest: Negative. Other: Carotid atherosclerosis. IMPRESSION: 1. Large soft tissue edema/hematoma overlying the left orbit and lower left frontal bone. No underlying fracture or dislocation seen. Left orbital globe appears grossly intact and normal in configuration. No retro-orbital hemorrhage or edema. 2. No acute intracranial abnormality. No intracranial hemorrhage or edema. No skull fracture. 3. No fracture or acute subluxation within the cervical spine. Scoliosis and mild degenerative change. 4. Carotid atherosclerosis. Electronically Signed   By: Franki Cabot M.D.   On: 01/19/2017 19:21   US Abdomen Complete  Result Date: 02/07/2017 CLINICAL DATA:  Alcohol induced fatty liver disease, hepatosplenomegaly. EXAM: ABDOMEN ULTRASOUND COMPLETE COMPARISON:  Abdominal MRI of January 09, 2014 FINDINGS: Gallbladder: The gallbladder is adequately distended. There are no discrete stones. There echogenic bile or sludge present that appears tumefactive. There is no gallbladder wall thickening, pericholecystic fluid, or positive sonographic Murphy's sign. Common bile duct: Diameter: 6 mm Liver: The hepatic echotexture is increased. The surface contour is fairly smooth. There is no solid-appearing mass. Portal vein is patent on color Doppler imaging with reversal of the direction of blood flow away from the liver. IVC: No abnormality visualized. Pancreas: There is limited visualization of the pancreas with the head and tail largely obscured by bowel gas. A previously demonstrated pancreatic head cyst cannot be evaluated today. Spleen: Normal in size at 5.5 cm in length. There are varices associated with the spleen. Right Kidney: Length: 13.4 cm. Echogenicity  within normal limits. No mass or hydronephrosis visualized. Left Kidney: Length: 13 cm. Echogenicity within normal limits. No mass or hydronephrosis visualized. Abdominal aorta: Limited visualization of the abdominal aorta due to bowel gas. The maximal diameter is observed proximally is 2.9 cm. Other findings: No ascites is observed. IMPRESSION: Inspissated sludge within the gallbladder without discrete stones. No sonographic evidence of acute cholecystitis. Increased hepatic echotexture. There is evidence of portal hypertension with reversal of flow in the portal vein. Splenic varices are visible There is no splenomegaly or ascites. The pancreatic head is obscured by bowel gas. A previously demonstrated pancreatic head cyst cannot be assessed. Electronically Signed   By: David  Martinique M.D.   On: 02/07/2017 13:36     Assessment and plan- Patient is a 62 y.o. male referred for microcytic anemia and thrombocytopenia  I discussed the results of the blood work with the patient in detail.  Patient has evidence of macrocytic anemia in the setting of normal TSH and B12 and evidence of alcohol abuse.  His liver function tests are also consistent with chronic liver disease given the fact that he has AST greater than ALT, elevated bilirubin both direct and indirect as well as elevated PT/INR.  Ultrasound of the liver showed increased hepatic echotexture along with evidence of portal hypertension and splenic varices consistent with chronic liver disease.  Although his haptoglobin is low I suspect this is because of liver disease which produces haptoglobin.  Reticulocyte count when corrected for the degree of anemia shows an RPR index of less than 2 which is consistent with a hypoproliferative anemia and not necessarily hemolysis.  At this time all his labs are consistent with anemia and thrombocytopenia secondary to chronic liver disease.  Elevated ferritin and iron saturation is also secondary to the same.  Patient  is  due to see Dr. [name] next month.  From my standpoint I will repeat his CBC along with LDH and Coombs test to rule out hemolytic anemia and see him back in 1 month's time.  I have asked him to take folic acid 1 mg p.o. daily.  I have again strongly emphasized the need for him to completely abstain from alcohol.  Patient states that he has cut down his drinking to 1-2 glasses of beer per day   Visit Diagnosis 1. Thrombocytopenia ([place])   2. Macrocytic anemia   3. Chronic liver disease due to alcohol [facility])      Dr. Randa Evens, MD, MPH [facility] Pager- [phone] 02/17/2017 12:04 PM

## 2017-02-20 NOTE — Telephone Encounter (Signed)
Sent mychart message, unable to reach patient per phone

## 2017-02-21 NOTE — Telephone Encounter (Signed)
FYI

## 2017-02-21 NOTE — Telephone Encounter (Signed)
Pt called and stated he takes tramadol for the arthritis in his back.

## 2017-02-21 NOTE — Telephone Encounter (Signed)
Can you see how often he takes this medication? Please see how long he has been on this medication for. Also ensure he is not taking this with alcohol. Thanks.

## 2017-02-21 NOTE — Telephone Encounter (Signed)
fyi

## 2017-02-22 DIAGNOSIS — J301 Allergic rhinitis due to pollen: Secondary | ICD-10-CM | POA: Diagnosis not present

## 2017-02-22 NOTE — Telephone Encounter (Signed)
Left message to return call to ask patient how often he takes this and how long he has been on this medication. Also to make sure he is not taking this with alcohol, ok for pec to speak with patient about this

## 2017-02-23 ENCOUNTER — Encounter: Payer: Self-pay | Admitting: Oncology

## 2017-02-23 MED ORDER — TRAMADOL HCL 50 MG PO TABS: 50.0000 mg | ORAL_TABLET | Freq: Three times a day (TID) | ORAL | 0 refills | Status: DC | PRN

## 2017-02-23 NOTE — Telephone Encounter (Signed)
Pt states he has been on this medication for 1 year. He said the first rx was from his orthopedic then Dr. Lacinda Axon. Pt does not take it everyday and he states when he does take it he does not drink alcoholic drinks of any kind.

## 2017-02-23 NOTE — Telephone Encounter (Signed)
Printed.  Please fax.

## 2017-02-23 NOTE — Telephone Encounter (Signed)
fyi

## 2017-02-23 NOTE — Telephone Encounter (Signed)
faxed

## 2017-02-23 NOTE — Addendum Note (Signed)
Addended by: [name] on: 02/23/2017 02:40 PM   Modules accepted: Orders

## 2017-03-01 DIAGNOSIS — D696 Thrombocytopenia, unspecified: Secondary | ICD-10-CM | POA: Diagnosis not present

## 2017-03-02 DIAGNOSIS — J301 Allergic rhinitis due to pollen: Secondary | ICD-10-CM | POA: Diagnosis not present

## 2017-03-07 ENCOUNTER — Other Ambulatory Visit: Payer: Self-pay | Admitting: *Deleted

## 2017-03-07 ENCOUNTER — Encounter: Payer: Self-pay | Admitting: Oncology

## 2017-03-07 DIAGNOSIS — Z85828 Personal history of other malignant neoplasm of skin: Secondary | ICD-10-CM | POA: Diagnosis not present

## 2017-03-07 DIAGNOSIS — L304 Erythema intertrigo: Secondary | ICD-10-CM | POA: Diagnosis not present

## 2017-03-07 DIAGNOSIS — Z1283 Encounter for screening for malignant neoplasm of skin: Secondary | ICD-10-CM | POA: Diagnosis not present

## 2017-03-08 ENCOUNTER — Telehealth: Payer: Self-pay | Admitting: Cardiovascular Disease

## 2017-03-08 NOTE — Telephone Encounter (Signed)
Left voicemail message to call back  

## 2017-03-08 NOTE — Telephone Encounter (Signed)
Patient calling in regards to heart rate Patient has noticed the last few days his pulse has been hardly above 52 Would like to discuss with nurse Please call

## 2017-03-09 DIAGNOSIS — J301 Allergic rhinitis due to pollen: Secondary | ICD-10-CM | POA: Diagnosis not present

## 2017-03-14 ENCOUNTER — Ambulatory Visit: Payer: 59 | Admitting: Gastroenterology

## 2017-03-14 ENCOUNTER — Encounter: Payer: Self-pay | Admitting: Gastroenterology

## 2017-03-14 VITALS — BP 178/71 | HR 59 | Ht 72.0 in | Wt 245.8 lb

## 2017-03-14 DIAGNOSIS — K7030 Alcoholic cirrhosis of liver without ascites: Secondary | ICD-10-CM

## 2017-03-14 DIAGNOSIS — R748 Abnormal levels of other serum enzymes: Secondary | ICD-10-CM | POA: Diagnosis not present

## 2017-03-14 NOTE — Progress Notes (Signed)
Primary Care Physician: [name], MD  Primary Gastroenterologist:  Dr. Lucilla Lame  Chief Complaint  Patient presents with  . Elevated Hepatic Enzymes    HPI: Peter [name] is a 62 y.o. male here for abnormal liver enzymes. The patient had seen me back in May 2018 for an upper endoscopy due to be admitted to the hospital with melena.  The patient has had chronically elevated liver enzymes. The patient's AST has been elevated as far back as January 2017 with a elevated bilirubin of 2.1 at that time.  The bilirubin is now 3.8 with a AST of 92.  The patient reports that he had been drinking approximate 5 beers a day but has cut down to 2 beers a day.He was also found to have platelets at 87. The patient's ultrasound showed some sludge in the gallbladder without any stones but increased hepatic echogenicity and evidence of portal hypertension with reversal of flow in the portal vein.  There is also splenic varices seen.  There is no ascites reported in the pancreas could not be seen.  The patient did have a history of a pancreatic cyst that was followed in [place] and had been decreasing in size.  Current Outpatient Medications  Medication Sig Dispense Refill  . Alirocumab (PRALUENT) 150 MG/ML SOPN Inject 1 pen every 14 (fourteen) days into the skin. 6 pen 3  . BYSTOLIC 20 MG TABS TAKE 1 TABLET (20 MG TOTAL) BY MOUTH DAILY. 90 tablet 2  . colchicine 0.6 MG tablet Take 0.6 mg by mouth as needed (gout). For gout flare ups    . doxazosin (CARDURA) 2 MG tablet Take 1 tablet (2 mg total) by mouth daily. 60 tablet 6  . folic acid (FOLVITE) 1 MG tablet Take 1 tablet (1 mg total) by mouth daily. 30 tablet 1  . hydrOXYzine (ATARAX/VISTARIL) 50 MG tablet Take 50 mg by mouth at bedtime. Reported on 02/04/2015    . lisinopril (PRINIVIL,ZESTRIL) 40 MG tablet TAKE 1 TABLET (40 MG TOTAL) BY MOUTH DAILY. 30 tablet 1  . milk thistle 175 MG tablet Take 350 mg by mouth daily.     . Multiple  Vitamin (MULTIVITAMIN) tablet Take 1 tablet by mouth daily. Centrum Plus -Take 1 daily    . omeprazole (PRILOSEC) 20 MG capsule Take 20 mg by mouth daily.    . tamsulosin (FLOMAX) 0.4 MG CAPS capsule Take 0.4 mg by mouth daily.     . traMADol (ULTRAM) 50 MG tablet Take 1 tablet (50 mg total) by mouth every 8 (eight) hours as needed. 30 tablet 0  . triamcinolone cream (KENALOG) 0.1 % Apply 1 application topically daily as needed.     . VOLTAREN 1 % GEL Apply 2 g topically as needed (knees).      No current facility-administered medications for this visit.     Allergies as of 03/14/2017 - Review Complete 03/14/2017  Allergen Reaction Noted  . Clonazepam  01/30/2017  . Crestor [rosuvastatin] Other (See Comments) 12/25/2012  . Dust mite extract  10/10/2016  . Hydrocodone bitartrate er  07/01/2014  . Lipitor [atorvastatin] Other (See Comments) 05/01/2015  . Topamax [topiramate]  04/26/2016    ROS:  General: Negative for anorexia, weight loss, fever, chills, fatigue, weakness. ENT: Negative for hoarseness, difficulty swallowing , nasal congestion. CV: Negative for chest pain, angina, palpitations, dyspnea on exertion, peripheral edema.  Respiratory: Negative for dyspnea at rest, dyspnea on exertion, cough, sputum, wheezing.  GI: See history of present  illness. GU:  Negative for dysuria, hematuria, urinary incontinence, urinary frequency, nocturnal urination.  Endo: Negative for unusual weight change.    Physical Examination:   BP (!) 178/71   Pulse (!) 59   Ht 6' (1.829 m)   Wt 245 lb 12.8 oz (111.5 kg)   BMI 33.34 kg/m   General: Well-nourished, well-developed in no acute distress.  Eyes: No icterus. Conjunctivae pink. Mouth: Oropharyngeal mucosa moist and pink , no lesions erythema or exudate. Lungs: Clear to auscultation bilaterally. Non-labored. Heart: Regular rate and rhythm, no murmurs rubs or gallops.  Abdomen: Bowel sounds are normal, nontender, nondistended, no  hepatosplenomegaly or masses, no abdominal bruits or hernia , no rebound or guarding.   Extremities: No lower extremity edema. No clubbing or deformities. Neuro: Alert and oriented x 3.  Grossly intact. Skin: Warm and dry, no jaundice.   Psych: Alert and cooperative, normal mood and affect.  Labs:    Imaging Studies: No results found.  Assessment and Plan:   Peter [name] is a 62 y.o. y/o male with signs and symptoms consistent with cirrhosis with portal hypertension.  The patient will have his labs sent off for other possible causes of abnormal liver enzymes.  The patient has also been told to stop drinking and have his labs checked again in a month.  The majority of his bilirubin which will also elevated was unconjugated bilirubin.  The patient is being followed by hematology also.  The patient has been explained the plan and agrees with it.    Lucilla Lame, MD. Marval Regal   Note: This dictation was prepared with Dragon dictation along with smaller phrase technology. Any transcriptional errors that result from this process are unintentional.

## 2017-03-14 NOTE — Telephone Encounter (Signed)
Spoke with patient and he states that he has history of afib and monitors his blood pressure and heart rate three times daily. He reports having some readings in the low 60's and was concerned. Reviewed normal heart rate ranges and to continue monitoring. He reports that it has returned to normal at this time and has no further concerns. He verbalized understanding of our conversation and had no further questions.

## 2017-03-14 NOTE — Telephone Encounter (Signed)
Patient returning call from pam about hr issues he states since original call has returned to normal

## 2017-03-14 NOTE — Telephone Encounter (Signed)
Left voicemail message to call back  

## 2017-03-16 ENCOUNTER — Inpatient Hospital Stay: Payer: 59 | Admitting: Oncology

## 2017-03-16 ENCOUNTER — Telehealth: Payer: Self-pay | Admitting: Oncology

## 2017-03-16 NOTE — Telephone Encounter (Signed)
Patient NoShow MD appt today. Called patient to reschedule. Patient was unavailable. L/M on V/M to call for appt.

## 2017-03-17 ENCOUNTER — Other Ambulatory Visit: Payer: Self-pay | Admitting: *Deleted

## 2017-03-20 ENCOUNTER — Telehealth: Payer: Self-pay | Admitting: Oncology

## 2017-03-20 ENCOUNTER — Telehealth: Payer: Self-pay | Admitting: *Deleted

## 2017-03-20 ENCOUNTER — Telehealth: Payer: Self-pay | Admitting: Gastroenterology

## 2017-03-20 ENCOUNTER — Telehealth: Payer: Self-pay | Admitting: Cardiovascular Disease

## 2017-03-20 NOTE — Telephone Encounter (Signed)
Spoke with patient and wanted to know about restarting his Eliquis. He reports that he started it back on Friday 03/17/17 and developed a nose bleed which he was able to stop. He also reports that previously he had dark tarry stools in the past but have since improved back to normal. Advised that per notes he was to follow up with hematology for recommendations when to restart and that I would also check with Dr. Rockey Situ. He verbalized understanding with no further questions at this time.

## 2017-03-20 NOTE — Telephone Encounter (Signed)
Patient was prescribed Eliquis about 6 months ago He suffered an eye injury in which he had blood in his eye so his doctor suggested he stop taking Eliquis until the eye healed Went 4 weeks without and then started back on Eliquis Since then he has had nose bleeds Is wondering if he should stop Eliquis all together or perhaps change the dosage Please call to discuss

## 2017-03-20 NOTE — Telephone Encounter (Signed)
Pt and I already spoke today about his appt r/s with our office and the need for additional labs to see what kind of anemia patient may have. He was going to call me back when he gets appt with Dr. [name] after he has been off alcohol x 2 weeks.  I gave him my number and he called back later in the day stating he forgot to tell me that he had a nose bleed last night and it took a while to get it to stop. He wants to know if he should go back  On it. I checked with Dr. Janese Banks and she states that if it is one time nose bleed it should be fine to rtn to eliquis. If he has multiple nose bleeds then he should contact cardiologist to let them know so they can make decision about blood thinner. His most recent cbc was 2/20 and hgb 11.4and plt 94. Based on that she should cont. The eliquis as per above. And he can call me with questions but should contact cardiology if it is cont. problem

## 2017-03-20 NOTE — Telephone Encounter (Signed)
Left voicemail message to call back  

## 2017-03-20 NOTE — Telephone Encounter (Signed)
Please call patient as he has questions about his lab orders to [facility].

## 2017-03-20 NOTE — Telephone Encounter (Signed)
Spoke with patient and he reports that he reached out to hematology. Discussed with Dr. Rockey Situ and he recommends that he restart when cleared by hematology standpoint. Reviewed all of this information with him and he verbalized understanding with no further questions at this time.

## 2017-03-20 NOTE — Telephone Encounter (Signed)
Pt returning our call  ° °

## 2017-03-20 NOTE — Telephone Encounter (Signed)
Called and spoke to pt and he states he wants to know if he can get his lab results. He is being closely monitored by GI MD Dr. [name] and feels like the 2 doctors are overlapping and wondered if he even needs to come back here. I told him that they are both monitoring the hgb and plt.  GI is looking out from what effect Gi tract has on the hgb and plt and liver enzymes and dr Janese Banks is doing test to see what is causing the anemia and she has some more tests to run on pt. He states that he knows most of his problems are from drinking alcohol and Dr. [name] wants him to stop drinking for 2 weeks to see how the labs are. He is to call Dr. [name] when he has been 2 weeks free of alcohol and I told him once he gets appt with Dr. [name] to call me and I gave him my direct number and he will use it and I will work him in for the same day as he sees Dr. [name]. He is agreeable to this plab

## 2017-03-20 NOTE — Telephone Encounter (Signed)
LVM for pt to return my call.

## 2017-03-22 ENCOUNTER — Other Ambulatory Visit: Payer: Self-pay | Admitting: *Deleted

## 2017-03-22 NOTE — Telephone Encounter (Signed)
LVM again for pt to return my call.  

## 2017-03-23 DIAGNOSIS — J301 Allergic rhinitis due to pollen: Secondary | ICD-10-CM | POA: Diagnosis not present

## 2017-03-24 ENCOUNTER — Encounter (INDEPENDENT_AMBULATORY_CARE_PROVIDER_SITE_OTHER): Payer: Self-pay | Admitting: Orthopaedic Surgery

## 2017-03-24 ENCOUNTER — Ambulatory Visit (INDEPENDENT_AMBULATORY_CARE_PROVIDER_SITE_OTHER): Payer: Self-pay

## 2017-03-24 ENCOUNTER — Ambulatory Visit (INDEPENDENT_AMBULATORY_CARE_PROVIDER_SITE_OTHER): Payer: 59 | Admitting: Orthopaedic Surgery

## 2017-03-24 VITALS — BP 129/61 | HR 65 | Resp 18 | Ht 72.0 in | Wt 239.0 lb

## 2017-03-24 DIAGNOSIS — G8929 Other chronic pain: Secondary | ICD-10-CM

## 2017-03-24 DIAGNOSIS — M25512 Pain in left shoulder: Secondary | ICD-10-CM | POA: Diagnosis not present

## 2017-03-24 DIAGNOSIS — M545 Low back pain, unspecified: Secondary | ICD-10-CM

## 2017-03-24 MED ORDER — BUPIVACAINE HCL 0.5 % IJ SOLN
2.0000 mL | INTRAMUSCULAR | Status: AC | PRN
  Administered 2017-03-24: 2 mL via INTRA_ARTICULAR

## 2017-03-24 MED ORDER — LIDOCAINE HCL 2 % IJ SOLN
2.0000 mL | INTRAMUSCULAR | Status: AC | PRN
  Administered 2017-03-24: 2 mL

## 2017-03-24 MED ORDER — METHYLPREDNISOLONE ACETATE 40 MG/ML IJ SUSP
80.0000 mg | INTRAMUSCULAR | Status: AC | PRN
  Administered 2017-03-24: 80 mg

## 2017-03-24 NOTE — Progress Notes (Signed)
Office Visit Note   Patient: Peter [name]           Date of Birth: Jun 27, 1955           MRN: [id number] Visit Date: 03/24/2017              Requested by: [name], MD [address] [place], [place] [address] PCP: [name], MD   Assessment & Plan: Visit Diagnoses:  1. Chronic bilateral low back pain without sciatica   2. Acute pain of left shoulder   3. Chronic left shoulder pain     Plan: Recent fall with onset of left shoulder pain.  X-rays negative.  Could have rotator cuff tear will inject the subacromial space and monitor his response.  Consider MRI scan if no improvement.  Also has chronic low back pain without response to injections.  He gets with having 1 of the spine surgeons evaluate him but will repeat the MRI scan  Follow-Up Instructions: Return after MRI L-S spine.   Orders:  Orders Placed This Encounter  Procedures  . MR Lumbar Spine w/o contrast  . XR Shoulder Left   No orders of the defined types were placed in this encounter.     Procedures: Large Joint Inj: L subacromial bursa on 03/24/2017 12:56 PM Indications: pain and diagnostic evaluation Details: 25 G 1.5 in needle, anterolateral approach  Arthrogram: No  Medications: 2 mL lidocaine 2 %; 2 mL bupivacaine 0.5 %; 80 mg methylPREDNISolone acetate 40 MG/ML Consent was given by the patient. Immediately prior to procedure a time out was called to verify the correct patient, procedure, equipment, support staff and site/side marked as required. Patient was prepped and draped in the usual sterile fashion.       Clinical Data: No additional findings.   Subjective: Chief Complaint  Patient presents with  . Left Shoulder - Pain  . Lower Back - Pain  . Shoulder Pain    Left shoulder pain x 2 weeks, fall x 1 month, not diabetic, no surgery to left shoulder, difficulty sleeping at night  . Back Pain    Low back pain, chronic, no surgery to low back , Tramadol  Onset  of left shoulder pain after a recent fall within the past month.  Having some difficulty raising his arm over his head.  Mild neck pain but cannot reproduce the pain in his shoulder with motion of his neck.  No numbness or tingling.  Has been on Eliquis with some bruising after the fall. Has chronic low back pain with an MRI scan within the last several years.  Cortisone injections by Dr. Ernestina Patches have not been effective.  Not having numbness or tingling but rather pain in his back  HPI  Review of Systems  Constitutional: Positive for activity change.  HENT: Negative for trouble swallowing.   Eyes: Positive for pain.  Respiratory: Negative for shortness of breath.   Cardiovascular: Negative for leg swelling.  Gastrointestinal: Positive for diarrhea.  Endocrine: Negative for cold intolerance.  Genitourinary: Negative for difficulty urinating.  Musculoskeletal: Positive for back pain and gait problem.  Skin: Negative for rash.  Allergic/Immunologic: Negative for food allergies.  Neurological: Negative for numbness.  Hematological: Does not bruise/bleed easily.  Psychiatric/Behavioral: Positive for sleep disturbance.     Objective: Vital Signs: BP 129/61 (BP Location: Right Arm, Patient Position: Sitting, Cuff Size: Normal)   Pulse 65   Resp 18   Ht 6' (1.829 m)   [facility]  239 lb (108.4 kg)   BMI 32.41 kg/m   Physical Exam  Ortho Exam awake alert and oriented x3.  Comfortable sitting.  Positive impingement and empty can testing left shoulder.  I could place his arm fully over his head passively but with a painful arc.  Some mild discomfort with motion of his neck but could not reproduce the pain in the shoulder with motion of his neck.  Good grip and good release.  Skin intact.  Specialty Comments:  No specialty comments available.  Imaging: Xr Shoulder Left  Result Date: 03/24/2017 Films of the left shoulder obtained in several projections.  Humeral head is centered about the glenoid.   No ectopic calcification.  Some degenerative change of the acromioclavicular joint.  Space between the humeral head and the acromion.  No osteophytes identified    PMFS History: Patient Active Problem List   Diagnosis Date Noted  . Thrombocytopenia ([place]) 01/23/2017  . Polypharmacy 01/20/2017  . Syncope 01/19/2017  . Elevated troponin 01/19/2017  . Alcohol abuse 01/19/2017  . Sinusitis 06/08/2016  . Tremor 03/23/2016  . Paresthesia 03/23/2016  . Anxiety 02/23/2016  . Typical atrial flutter ([place]) 09/21/2015  . CAD (coronary artery disease) 05/06/2015  . Aortic atherosclerosis ([place]) 03/20/2015  . History of alcohol abuse 02/04/2015  . Alcohol induced fatty liver 02/04/2015  . Obesity (BMI 30.0-34.9) 02/04/2015  . Back pain 02/04/2015  . History of prostate cancer 02/04/2015  . Chronic venous insufficiency 10/12/2012  . Lesion of ulnar nerve 09/03/2012  . Tobacco abuse 04/05/2011  . Hyperlipidemia 01/21/2010  . Essential hypertension 12/16/2009  . Premature beats 12/16/2009   Past Medical History:  Diagnosis Date  . A-fib ([place])   . Abnormality of gait 09/03/2012  . Alcohol abuse   . Anxiety   . Arthritis   . Arthritis   . Esophageal stricture   . GERD (gastroesophageal reflux disease)   . Gout   . Hyperlipidemia   . Hypertension   . Internal hemorrhoids   . Lesion of ulnar nerve 09/03/2012   Right ulnar neuropathy  . Murmur, cardiac   . Palpitations   . Prostate cancer ([place]) 2016   treated with radioactive seed implant  . Seizures ([place])    pt states he had seizure 12/14  . Tubular adenoma of colon 03/2008    Family History  Problem Relation Age of Onset  . Diabetes Father   . Kidney disease Father   . Lung cancer Father        smoker  . Hyperlipidemia Father   . Hypertension Father   . Lung cancer Mother        smoke  . Hypertension Mother   . Colon cancer Neg Hx   . Pancreatic cancer Neg Hx   . Stomach cancer Neg Hx     Past Surgical History:    Procedure Laterality Date  . CATARACT EXTRACTION W/PHACO Right 07/03/2014   Procedure: CATARACT EXTRACTION PHACO AND INTRAOCULAR LENS PLACEMENT (IOC);  Surgeon: Lyla Glassing, MD;  Location: [facility] ORS;  Service: Ophthalmology;  Laterality: Right;  lot # [id number] H [place]: 00:32.3 AP% 9.1 CDE: 2.92  . CIRCUMCISION    . COLONOSCOPY    . ELBOW SURGERY    . ELECTROPHYSIOLOGIC STUDY N/A 11/02/2015   Procedure: CARDIOVERSION;  Surgeon: Minna Merritts, MD;  Location: [facility] ORS;  Service: Cardiovascular;  Laterality: N/A;  . ESOPHAGOGASTRODUODENOSCOPY (EGD) WITH PROPOFOL N/A 05/17/2016   Procedure: ESOPHAGOGASTRODUODENOSCOPY (EGD) WITH PROPOFOL;  Surgeon: Lucilla Lame, MD;  Location:  [facility];  Service: Endoscopy;  Laterality: N/A;  . EYE SURGERY    . HAND SURGERY Left   . SHOULDER SURGERY     Social History   Occupational History  . Occupation: Retired    Fish farm manager: Economist  . Occupation: UTILITIES OPER.    Employer: [employer]  Tobacco Use  . Smoking status: Current Every Day Smoker    Packs/day: 0.30    Years: 24.00    Pack years: 7.20    Types: Cigarettes  . Smokeless tobacco: Never Used  . Tobacco comment: down to 3 ciggs a day.  Substance and Sexual Activity  . Alcohol use: No    Frequency: Never  . Drug use: No    Comment: 1970's occ.   Marland Kitchen Sexual activity: Not on file

## 2017-03-27 ENCOUNTER — Other Ambulatory Visit: Payer: Self-pay | Admitting: *Deleted

## 2017-03-29 DIAGNOSIS — J301 Allergic rhinitis due to pollen: Secondary | ICD-10-CM | POA: Diagnosis not present

## 2017-03-30 ENCOUNTER — Telehealth: Payer: Self-pay | Admitting: Gastroenterology

## 2017-03-30 NOTE — Telephone Encounter (Signed)
Pt is calling to inform us it has been 2 weeks since he had any alcohol he would like to get his blood drawn at [facility] on [address] fax# [phone]  He was told to call us and inform us to send the Labs

## 2017-03-31 NOTE — Telephone Encounter (Signed)
Pt notified he may go ahead and go to [facility] to have his labs done. Orders have been released.

## 2017-04-06 DIAGNOSIS — J301 Allergic rhinitis due to pollen: Secondary | ICD-10-CM | POA: Diagnosis not present

## 2017-04-07 DIAGNOSIS — J301 Allergic rhinitis due to pollen: Secondary | ICD-10-CM | POA: Diagnosis not present

## 2017-04-11 ENCOUNTER — Telehealth: Payer: Self-pay | Admitting: Cardiovascular Disease

## 2017-04-11 ENCOUNTER — Ambulatory Visit
Admission: RE | Admit: 2017-04-11 | Discharge: 2017-04-11 | Disposition: A | Discharge status: Home / Self Care | Payer: 59 | Source: Ambulatory Visit | Attending: Orthopaedic Surgery | Admitting: Orthopaedic Surgery

## 2017-04-11 DIAGNOSIS — M5136 Other intervertebral disc degeneration, lumbar region: Secondary | ICD-10-CM | POA: Diagnosis not present

## 2017-04-11 DIAGNOSIS — M545 Low back pain: Principal | ICD-10-CM

## 2017-04-11 DIAGNOSIS — G8929 Other chronic pain: Secondary | ICD-10-CM

## 2017-04-11 NOTE — Telephone Encounter (Signed)
I called and left a detailed message on the patient's voice mail (ok per DPR) that he may try  - coridcidin HBP/ plain claritin, allegra, or zyrtec - humidifier - nasal saline spray  To treat his symptoms.  I asked that he call back with any further questions.

## 2017-04-11 NOTE — Telephone Encounter (Signed)
Pt calling stating he is sick and has a head cold  He's taken aleve and would like to know if that is okay to take He states there is not a lot he can take without getting his BP high  Would like our advise on this Please call back

## 2017-04-13 DIAGNOSIS — J301 Allergic rhinitis due to pollen: Secondary | ICD-10-CM | POA: Diagnosis not present

## 2017-04-14 ENCOUNTER — Encounter (INDEPENDENT_AMBULATORY_CARE_PROVIDER_SITE_OTHER): Payer: Self-pay | Admitting: Orthopaedic Surgery

## 2017-04-14 ENCOUNTER — Ambulatory Visit (INDEPENDENT_AMBULATORY_CARE_PROVIDER_SITE_OTHER): Payer: 59 | Admitting: Orthopaedic Surgery

## 2017-04-14 VITALS — BP 117/50 | HR 54 | Resp 16 | Ht 72.0 in | Wt 229.0 lb

## 2017-04-14 DIAGNOSIS — M545 Low back pain: Secondary | ICD-10-CM

## 2017-04-14 DIAGNOSIS — G8929 Other chronic pain: Secondary | ICD-10-CM

## 2017-04-14 DIAGNOSIS — M25512 Pain in left shoulder: Secondary | ICD-10-CM

## 2017-04-14 NOTE — Progress Notes (Signed)
Office Visit Note   Patient: Peter [name]           Date of Birth: [birth year]           MRN: [id number] Visit Date: 04/14/2017              Requested by: [name], MD [address] [place], [place] [address] PCP: [name], MD   Assessment & Plan: Visit Diagnoses:  1. Chronic bilateral low back pain without sciatica   2. Chronic left shoulder pain     Plan: MRI of the lumbar spine reveals progressive degenerative disease at L4-5 where there is a disc bulge is symmetrically prominent to the left protruding disc beyond the left foramen that has increased in size from the study performed in 2016.  There are some has had a prior subacromial cortisone injection with only minimal relief of his pain.  I will order an MRI scan extraforaminal disc protrusions at L3-4 and L2-3 that are really unchanged presently not having much trouble with his back and not experiencing sciatica.  Talked about exercises His biggest problem is with his left shoulder.  He is having pain to the point of compromise and difficulty sleeping.  He has had pain with overhead motion.  I will order an MRI scan of the left shoulder  Follow-Up Instructions: Return after MRI left shoulder.   Orders:  Orders Placed This Encounter  Procedures  . MR Shoulder Left w/o contrast   No orders of the defined types were placed in this encounter.     Procedures: No procedures performed   Clinical Data: No additional findings.   Subjective: Chief Complaint  Patient presents with  . Lower Back - Follow-up  . Left Shoulder - Pain  . Back Pain    MRI RESULTS  . Shoulder Pain    Left shoulder pain, limited range of motion, inj at last visit - didn't help, burning, fell 01/2017, no surgery to left shoulder, not diabetic  MRI scan results as above.  Discussed exercises and even further cortisone injections.  For the moment he is comfortable.  He is having more trouble with his left shoulder to  the point of compromise.  He has had overhead discomfort and trouble sleeping.  He has had a prior rotator cuff repair on the right I suspect he may have the same on the left and will order an MRI scan  HPI  Review of Systems  Constitutional: Negative for activity change.  HENT: Negative for trouble swallowing.   Eyes: Positive for pain.  Respiratory: Negative for shortness of breath.   Cardiovascular: Negative for leg swelling.  Gastrointestinal: Negative for constipation.  Endocrine: Positive for cold intolerance.  Genitourinary: Positive for difficulty urinating.  Musculoskeletal: Positive for back pain, gait problem, neck pain and neck stiffness.  Skin: Negative for rash.  Allergic/Immunologic: Negative for food allergies.  Neurological: Negative for weakness.  Psychiatric/Behavioral: Positive for sleep disturbance.     Objective: Vital Signs: BP (!) 117/50 (BP Location: Left Arm, Patient Position: Sitting, Cuff Size: Normal)   Pulse (!) 54   Resp 16   Ht 6' (1.829 m)   Wt 229 lb (103.9 kg)   BMI 31.06 kg/m   Physical Exam  Ortho Exam awake alert and oriented x3.  Comfortable sitting.  Straight leg raise negative bilaterally.  Painless range of motion of both hips and knees.  Motor exam intact.  Painful overhead motion of left shoulder.  Full  overhead motion and abduction.  Positive impingement and minimally positive empty can testing.  Good strength.  Biceps appears to be intact.  Some mild anterior and lateral subacromial pain  Specialty Comments:  No specialty comments available.  Imaging: No results found.   PMFS History: Patient Active Problem List   Diagnosis Date Noted  . Thrombocytopenia ([place]) 01/23/2017  . Polypharmacy 01/20/2017  . Syncope 01/19/2017  . Elevated troponin 01/19/2017  . Alcohol abuse 01/19/2017  . Sinusitis 06/08/2016  . Tremor 03/23/2016  . Paresthesia 03/23/2016  . Anxiety 02/23/2016  . Typical atrial flutter ([place]) 09/21/2015  .  CAD (coronary artery disease) 05/06/2015  . Aortic atherosclerosis ([place]) 03/20/2015  . History of alcohol abuse 02/04/2015  . Alcohol induced fatty liver 02/04/2015  . Obesity (BMI 30.0-34.9) 02/04/2015  . Back pain 02/04/2015  . History of prostate cancer 02/04/2015  . Chronic venous insufficiency 10/12/2012  . Lesion of ulnar nerve 09/03/2012  . Tobacco abuse 04/05/2011  . Hyperlipidemia 01/21/2010  . Essential hypertension 12/16/2009  . Premature beats 12/16/2009   Past Medical History:  Diagnosis Date  . A-fib ([place])   . Abnormality of gait 09/03/2012  . Alcohol abuse   . Anxiety   . Arthritis   . Arthritis   . Esophageal stricture   . GERD (gastroesophageal reflux disease)   . Gout   . Hyperlipidemia   . Hypertension   . Internal hemorrhoids   . Lesion of ulnar nerve 09/03/2012   Right ulnar neuropathy  . Murmur, cardiac   . Palpitations   . Prostate cancer ([place]) 2016   treated with radioactive seed implant  . Seizures ([place])    pt states he had seizure 12/14  . Tubular adenoma of colon 03/2008    Family History  Problem Relation Age of Onset  . Diabetes Father   . Kidney disease Father   . Lung cancer Father        smoker  . Hyperlipidemia Father   . Hypertension Father   . Lung cancer Mother        smoke  . Hypertension Mother   . Colon cancer Neg Hx   . Pancreatic cancer Neg Hx   . Stomach cancer Neg Hx     Past Surgical History:  Procedure Laterality Date  . CATARACT EXTRACTION W/PHACO Right 07/03/2014   Procedure: CATARACT EXTRACTION PHACO AND INTRAOCULAR LENS PLACEMENT (IOC);  Surgeon: Lyla Glassing, MD;  Location: [facility];  Service: Ophthalmology;  Laterality: Right;  lot # [id number] H [place]: 00:32.3 AP% 9.1 CDE: 2.92  . CIRCUMCISION    . COLONOSCOPY    . ELBOW SURGERY    . ELECTROPHYSIOLOGIC STUDY N/A 11/02/2015   Procedure: CARDIOVERSION;  Surgeon: Minna Merritts, MD;  Location: [facility];  Service: Cardiovascular;  Laterality: N/A;  .  ESOPHAGOGASTRODUODENOSCOPY (EGD) WITH PROPOFOL N/A 05/17/2016   Procedure: ESOPHAGOGASTRODUODENOSCOPY (EGD) WITH PROPOFOL;  Surgeon: Lucilla Lame, MD;  Location: [facility];  Service: Endoscopy;  Laterality: N/A;  . EYE SURGERY    . HAND SURGERY Left   . SHOULDER SURGERY     Social History   Occupational History  . Occupation: Retired    Fish farm manager: Economist  . Occupation: UTILITIES OPER.    Employer: [employer]  Tobacco Use  . Smoking status: Current Every Day Smoker    Packs/day: 0.30    Years: 24.00    Pack years: 7.20    Types: Cigarettes  . Smokeless tobacco: Never Used  . Tobacco comment: down to  3 ciggs a day.  Substance and Sexual Activity  . Alcohol use: No    Frequency: Never  . Drug use: No    Types: Marijuana    Comment: 1970's occ.   Marland Kitchen Sexual activity: Not on file

## 2017-04-17 ENCOUNTER — Telehealth: Payer: Self-pay | Admitting: *Deleted

## 2017-04-17 NOTE — Telephone Encounter (Signed)
Called pt. He was placed on wait list today for sooner appt. I offered tomorrow at 730am. Pt agreeable and aware to check in no later than 715am. I scheduled pt.

## 2017-04-18 ENCOUNTER — Encounter: Payer: Self-pay | Admitting: Neurology

## 2017-04-18 ENCOUNTER — Ambulatory Visit: Payer: 59 | Admitting: Neurology

## 2017-04-18 VITALS — BP 154/75 | HR 62 | Ht 72.0 in | Wt 232.5 lb

## 2017-04-18 DIAGNOSIS — R251 Tremor, unspecified: Secondary | ICD-10-CM | POA: Diagnosis not present

## 2017-04-18 MED ORDER — GABAPENTIN 300 MG PO CAPS: 300.0000 mg | ORAL_CAPSULE | Freq: Three times a day (TID) | ORAL | 3 refills | Status: DC

## 2017-04-18 NOTE — Patient Instructions (Signed)
   We will start gabapentin 200 mg three time a day, then start 300 mg three times a day.  Neurontin (gabapentin) may result in drowsiness, ankle swelling, gait instability, or possibly dizziness. Please contact our office if significant side effects occur with this medication.

## 2017-04-18 NOTE — Progress Notes (Signed)
Reason for visit: Tremor  Peter [name] is an 62 y.o. male  History of present illness:  Peter [name] is a 62 year old right-handed white male with a history of tremor.  The patient also reports bladder problems with low back pain that is chronic in nature.  He just recently had MRI of the lumbar spine that did not show any surgically amenable issues.  The patient does have a disc bulge at the L4-5 level which may contact the left L4 nerve root.  There is a disc at the L3-4 level that may impinge upon the L3 nerve roots bilaterally.  The patient uses Ultram occasionally for the back pain.  He was in the hospital on 19 January 2017 with an episode of syncope.  The patient took 1 clonazepam dose and then started drinking beer.  When he stood up, he blacked out suddenly.  The clonazepam was stopped at that time.  The patient has stopped drinking alcohol.  He indicates that his tremors have improved.  He was told that his liver was affected from the alcohol.  He does have a mild chronic gait disorder that also may be related to chronic alcohol use.  He reports some right knee discomfort as well.  The patient now reports some occasional myoclonic jerks at night when he tries to sleep, this may keep him awake.  He is still complaining of low back pain.  He comes at this office for an evaluation.  Past Medical History:  Diagnosis Date  . A-fib ([place])   . Abnormality of gait 09/03/2012  . Alcohol abuse   . Anxiety   . Arthritis   . Arthritis   . Esophageal stricture   . GERD (gastroesophageal reflux disease)   . Gout   . Hyperlipidemia   . Hypertension   . Internal hemorrhoids   . Lesion of ulnar nerve 09/03/2012   Right ulnar neuropathy  . Murmur, cardiac   . PAF (paroxysmal atrial fibrillation) ([place])   . Palpitations   . Prostate cancer ([place]) 2016   treated with radioactive seed implant  . Seizures ([place])    pt states he had seizure 12/14  . Tubular adenoma of colon 03/2008    Past  Surgical History:  Procedure Laterality Date  . CATARACT EXTRACTION W/PHACO Right 07/03/2014   Procedure: CATARACT EXTRACTION PHACO AND INTRAOCULAR LENS PLACEMENT (IOC);  Surgeon: Lyla Glassing, MD;  Location: [facility] ORS;  Service: Ophthalmology;  Laterality: Right;  lot # [id number] H [place]: 00:32.3 AP% 9.1 CDE: 2.92  . CIRCUMCISION    . COLONOSCOPY    . ELBOW SURGERY    . ELECTROPHYSIOLOGIC STUDY N/A 11/02/2015   Procedure: CARDIOVERSION;  Surgeon: Minna Merritts, MD;  Location: [facility] ORS;  Service: Cardiovascular;  Laterality: N/A;  . ESOPHAGOGASTRODUODENOSCOPY (EGD) WITH PROPOFOL N/A 05/17/2016   Procedure: ESOPHAGOGASTRODUODENOSCOPY (EGD) WITH PROPOFOL;  Surgeon: Lucilla Lame, MD;  Location: [facility] ENDOSCOPY;  Service: Endoscopy;  Laterality: N/A;  . EYE SURGERY    . HAND SURGERY Left   . SHOULDER SURGERY      Family History  Problem Relation Age of Onset  . Diabetes Father   . Kidney disease Father   . Lung cancer Father        smoker  . Hyperlipidemia Father   . Hypertension Father   . Lung cancer Mother        smoke  . Hypertension Mother   . Colon cancer Neg Hx   . Pancreatic cancer Neg Hx   .  Stomach cancer Neg Hx     Social history:  reports that he has been smoking cigarettes.  He has a 7.20 pack-year smoking history. He has never used smokeless tobacco. He reports that he does not drink alcohol or use drugs.    Allergies  Allergen Reactions  . Clonazepam     Syncope  . Crestor [Rosuvastatin] Other (See Comments)    Bloating, swelling of legs, fatty liver  . Dust Mite Extract   . Hydrocodone Bitartrate Er   . Lipitor [Atorvastatin] Other (See Comments)    Myalgias  . Lisinopril Swelling  . Topamax [Topiramate]     Cognitive clouding    Medications:  Prior to Admission medications   Medication Sig Start Date End Date Taking? Authorizing Provider  Alirocumab (PRALUENT) 150 MG/ML SOPN Inject 1 pen every 14 (fourteen) days into the skin. 11/28/16  Yes Gollan,  Kathlene November, MD  apixaban (ELIQUIS) 5 MG TABS tablet Take 5 mg by mouth 2 (two) times daily.   Yes [provider]  BYSTOLIC 20 MG TABS TAKE 1 TABLET (20 MG TOTAL) BY MOUTH DAILY. 08/03/16  Yes Minna Merritts, MD  cloNIDine (CATAPRES) 0.1 MG tablet Take 0.1 mg by mouth daily.    Yes [provider]  doxazosin (CARDURA) 2 MG tablet Take 1 tablet (2 mg total) by mouth daily. 01/30/17  Yes Minna Merritts, MD  fluocinonide (LIDEX) 0.05 % external solution  03/08/17  Yes [provider]  folic acid (FOLVITE) 1 MG tablet Take 1 tablet (1 mg total) by mouth daily. 02/16/17  Yes Sindy Guadeloupe, MD  hydrOXYzine (ATARAX/VISTARIL) 50 MG tablet Take 50 mg by mouth at bedtime. Reported on 02/04/2015   Yes [provider]  milk thistle 175 MG tablet Take 350 mg by mouth daily.    Yes [provider]  Multiple Vitamin (MULTIVITAMIN) tablet Take 1 tablet by mouth daily. Centrum Plus -Take 1 daily   Yes [provider]  omeprazole (PRILOSEC) 20 MG capsule Take 20 mg by mouth daily.   Yes [provider]  OXISTAT 1 % lotion  03/19/17  Yes [provider]  tamsulosin (FLOMAX) 0.4 MG CAPS capsule Take 0.4 mg by mouth daily.    Yes [provider]  traMADol (ULTRAM) 50 MG tablet Take 1 tablet (50 mg total) by mouth every 8 (eight) hours as needed. 02/23/17  Yes [name], MD  triamcinolone cream (KENALOG) 0.1 % Apply 1 application topically daily as needed.  12/24/14  Yes [provider]  VOLTAREN 1 % GEL Apply 2 g topically as needed (knees).  04/02/13  Yes [provider]  colchicine 0.6 MG tablet Take 0.6 mg by mouth as needed (gout). For gout flare ups    [provider]  gabapentin (NEURONTIN) 300 MG capsule Take 1 capsule (300 mg total) by mouth 3 (three) times daily. 04/18/17   Kathrynn Ducking, MD    ROS:  Out of a complete 14 system review of symptoms, the patient complains only of the following  symptoms, and all other reviewed systems are negative.  Myoclonic jerks Low back pain  Blood pressure (!) 154/75, pulse 62, height 6' (1.829 m), weight 232 lb 8 oz (105.5 kg).  Physical Exam  General: The patient is alert and cooperative at the time of the examination.  The patient is moderately obese.  Skin: No significant peripheral edema is noted.   Neurologic Exam  Mental status: The patient is alert and  oriented x 3 at the time of the examination. The patient has apparent normal recent and remote memory, with an apparently normal attention span and concentration ability.   Cranial nerves: Facial symmetry is present. Speech is normal, no aphasia or dysarthria is noted. Extraocular movements are full. Visual fields are full.  Motor: The patient has good strength in all 4 extremities.  Sensory examination: Soft touch sensation is symmetric on the face, arms, and legs.  Coordination: The patient has good finger-nose-finger and heel-to-shin bilaterally.  When drawing a spiral, no significant tremor was noted in the handwriting.  Gait and station: The patient has a normal gait. Tandem gait is unsteady. Romberg is negative. No drift is seen.  Reflexes: Deep tendon reflexes are symmetric.   Assessment/Plan:  1.  Tremor, improved off alcohol  2.  Chronic low back pain  3.  Nocturnal myoclonus  The patient will be placed back on gabapentin taking 200 mg 3 times daily until the prescription is used up, then he will convert to 300 mg 3 times daily.  He will follow-up in 6 months.  He will remain off of clonazepam.  Jill Alexanders MD 04/18/2017 8:00 AM  [facility] [address] [place], [place] [address]  Phone [phone] Fax [phone]

## 2017-04-19 DIAGNOSIS — J301 Allergic rhinitis due to pollen: Secondary | ICD-10-CM | POA: Diagnosis not present

## 2017-04-20 ENCOUNTER — Telehealth: Payer: Self-pay | Admitting: Gastroenterology

## 2017-04-20 NOTE — Telephone Encounter (Signed)
Patient LVM asking about Aleve and back pain. Patient didn't answer and I had to LVM.  Per Dr. Vicente Males, due to liver issues patient can take Tylenol but no more than 2 tablets a day. If he is still having pain he will need to contact his PCP to ask about pain mediation.

## 2017-04-24 ENCOUNTER — Encounter: Payer: Self-pay | Admitting: Family Medicine

## 2017-04-24 ENCOUNTER — Ambulatory Visit: Payer: 59 | Admitting: Family Medicine

## 2017-04-24 ENCOUNTER — Other Ambulatory Visit: Payer: Self-pay

## 2017-04-24 ENCOUNTER — Telehealth: Payer: Self-pay | Admitting: Neurology

## 2017-04-24 DIAGNOSIS — R251 Tremor, unspecified: Secondary | ICD-10-CM | POA: Diagnosis not present

## 2017-04-24 DIAGNOSIS — F1011 Alcohol abuse, in remission: Secondary | ICD-10-CM

## 2017-04-24 DIAGNOSIS — K700 Alcoholic fatty liver: Secondary | ICD-10-CM | POA: Diagnosis not present

## 2017-04-24 DIAGNOSIS — J309 Allergic rhinitis, unspecified: Secondary | ICD-10-CM | POA: Insufficient documentation

## 2017-04-24 DIAGNOSIS — M545 Low back pain: Secondary | ICD-10-CM

## 2017-04-24 DIAGNOSIS — E669 Obesity, unspecified: Secondary | ICD-10-CM

## 2017-04-24 DIAGNOSIS — Z87898 Personal history of other specified conditions: Secondary | ICD-10-CM | POA: Diagnosis not present

## 2017-04-24 DIAGNOSIS — G8929 Other chronic pain: Secondary | ICD-10-CM

## 2017-04-24 MED ORDER — TRAMADOL HCL 50 MG PO TABS: 50.0000 mg | ORAL_TABLET | Freq: Two times a day (BID) | ORAL | 0 refills | Status: DC | PRN

## 2017-04-24 NOTE — Assessment & Plan Note (Addendum)
Discussed adding Claritin.  He will also see his ear nose and throat physician who is administering his allergy shots.  Nasal sprays likely are not a great option given periodically having blood in his nasal discharge.  Discussed bleeding precautions given that he is on Eliquis.

## 2017-04-24 NOTE — Assessment & Plan Note (Signed)
Chronic history of low back pain.  Relatively well managed with tramadol periodically.  Discussed appropriate dosing given his liver disease.  Refill given.  Discussed if he would require medication beyond this in the future he would need to be seen by pain management.

## 2017-04-24 NOTE — Patient Instructions (Signed)
Nice to see you. Please monitor your back pain on the tramadol.  Please continue to see your orthopedist. Please continue to work on dietary changes. Please see how you do with the gabapentin for your tremors. Please start on Claritin for your allergies.  If this does not help with your allergy symptoms and headaches please let us know.

## 2017-04-24 NOTE — Progress Notes (Signed)
Tommi Rumps, MD Phone: [phone]  Peter [name] is a 62 y.o. male who presents today for f/u.  Fatty liver: He is following with GI for this.  He has quit alcohol use 6 weeks ago.  Not taking Tylenol or Aleve or ibuprofen.  He is due to have repeat lab work and will have this done through GI.  He reports some allergy symptoms with rhinorrhea and drainage as well as occasional headaches and sinus discomfort.  No numbness or vision changes or weakness.  He gets allergy shots through his allergist.  He notes most of his issues are at night after he lays down.  He has at times had very minimal bleeding when he blows his nose.  He had one nosebleed where it took 5 minutes to get it to stop.  He has been evaluated by neurology for myoclonic jerks at night as well as tremors.  He notes the tremors have improved.  He has noted the myoclonic jerks over the last several weeks.  He is on gabapentin for these things.  He will follow-up with neurology for this.  Low back pain: He is following with orthopedics and had an MRI.  Notes no surgical intervention at this time.  He notes his back typically only bothers him after he has been active.  He occasionally takes tramadol for this.  He has chronic ulnar neuropathy with chronic numbness in the ulnar aspect of his right hand.  He notes no other numbness or weakness.  No loss of bowel or bladder function.  No saddle anesthesia.  He has changed his diet and is eating less fried foods.  Notes his weight has come down with this as well as ceasing alcohol use.  Social History   Tobacco Use  Smoking Status Current Every Day Smoker  . Packs/day: 0.30  . Years: 24.00  . Pack years: 7.20  . Types: Cigarettes  Smokeless Tobacco Never Used  Tobacco Comment   down to 3 ciggs a day.     ROS see history of present illness  Objective  Physical Exam Vitals:   04/24/17 1327  BP: 138/76  Pulse: (!) 56  Temp: 98 F (36.7 C)    BP Readings from  Last 3 Encounters:  04/24/17 138/76  04/18/17 (!) 154/75  04/14/17 (!) 117/50   Wt Readings from Last 3 Encounters:  04/24/17 237 lb 6.4 oz (107.7 kg)  04/18/17 232 lb 8 oz (105.5 kg)  04/14/17 229 lb (103.9 kg)    Physical Exam  Constitutional: No distress.  HENT:  Head: Normocephalic and atraumatic.  Mouth/Throat: Oropharynx is clear and moist. No oropharyngeal exudate.  Normal TM on the left, right TM obscured by cerumen, bilateral nasal mucosa erythematous and edematous  Cardiovascular: Normal rate, regular rhythm and normal heart sounds.  Pulmonary/Chest: Effort normal and breath sounds normal.  Musculoskeletal: He exhibits no edema.  Neurological: He is alert.  CN 2-12 intact, 5/5 strength in bilateral biceps, triceps, grip, quads, hamstrings, plantar and dorsiflexion, sensation to light touch intact in bilateral UE and LE, normal gait  Skin: Skin is warm and dry. He is not diaphoretic.     Assessment/Plan: Please see individual problem list.  Alcohol induced fatty liver He will follow with GI.  He is quit using alcohol.  He will get the lab work that they have ordered.  He reports he has been taking milk thistle for his liver.  I discussed that he should not take any supplements for this issue.  Tremor He will continue to follow with neurology for this as well as his myoclonic jerks.  He will continue gabapentin as prescribed by them.  Obesity (BMI 30.0-34.9) He will continue to work on diet and exercise.  History of alcohol abuse Congratulated on alcohol cessation.  Back pain Chronic history of low back pain.  Relatively well managed with tramadol periodically.  Discussed appropriate dosing given his liver disease.  Refill given.  Discussed if he would require medication beyond this in the future he would need to be seen by pain management.  Allergic rhinitis Discussed adding Claritin.  He will also see his ear nose and throat physician who is administering his  allergy shots.  Nasal sprays likely are not a great option given periodically having blood in his nasal discharge.  Discussed bleeding precautions given that he is on Eliquis.  No orders of the defined types were placed in this encounter.   Meds ordered this encounter  Medications  . DISCONTD: traMADol (ULTRAM) 50 MG tablet    Sig: Take 1 tablet (50 mg total) by mouth every 12 (twelve) hours as needed for moderate pain.    Dispense:  30 tablet    Refill:  0  . traMADol (ULTRAM) 50 MG tablet    Sig: Take 1 tablet (50 mg total) by mouth every 12 (twelve) hours as needed for moderate pain.    Dispense:  30 tablet    Refill:  0     Tommi Rumps, MD [place]

## 2017-04-24 NOTE — Assessment & Plan Note (Addendum)
He will follow with GI.  He is quit using alcohol.  He will get the lab work that they have ordered.  He reports he has been taking milk thistle for his liver.  I discussed that he should not take any supplements for this issue.

## 2017-04-24 NOTE — Telephone Encounter (Signed)
I called regarding the gabapentin, the patient indicates that he is having problems with balance on the drug, we will stop the medication therefore.  We will not use any medications for the tremor or for the back pain at this time.

## 2017-04-24 NOTE — Assessment & Plan Note (Signed)
He will continue to work on diet and exercise.

## 2017-04-24 NOTE — Assessment & Plan Note (Signed)
He will continue to follow with neurology for this as well as his myoclonic jerks.  He will continue gabapentin as prescribed by them.

## 2017-04-24 NOTE — Telephone Encounter (Addendum)
Pt requesting a call back to discuss gabapentin. Pt stating he isnt sure why he is on this medication when all the s/e are things he is already dealing with such as bad balance, shakiness, and liver problems. Pt feel there was a mistake putting him on this medication. Please call to advise

## 2017-04-24 NOTE — Assessment & Plan Note (Signed)
Congratulated on alcohol cessation.

## 2017-04-25 NOTE — Telephone Encounter (Signed)
I called the patient.  Many medications used for tremor such as Mysoline cannot be used as the patient is on Eliquis.  The patient cannot tolerate gabapentin, therefore we will not use Lyrica.  Could potentially try Topamax for the tremor, this would not help the back pain however.  If the patient wishes to try Topamax, we will get this started.

## 2017-04-25 NOTE — Telephone Encounter (Signed)
Pt requesting a call to discuss other medications he can try to replace gabapentin. Please call to advise

## 2017-04-26 MED ORDER — GABAPENTIN 300 MG PO CAPS: 300.0000 mg | ORAL_CAPSULE | Freq: Three times a day (TID) | ORAL | 3 refills | Status: DC

## 2017-04-26 NOTE — Addendum Note (Signed)
Addended by: Kathrynn Ducking on: 04/26/2017 05:01 PM   Modules accepted: Orders

## 2017-04-26 NOTE — Telephone Encounter (Signed)
I called the patient.  The patient was tolerating the gabapentin well, he had stopped the drug just because he thought he might have side effects on it.  The patient will get back on the gabapentin taking 300 mg 3 times daily, he will call me in a couple weeks for any dose adjustments.

## 2017-04-26 NOTE — Telephone Encounter (Signed)
Pt returning Dr. Tobey Grim call stating he would like to move forward and try topamax but would like to discuss this as well

## 2017-04-27 ENCOUNTER — Ambulatory Visit
Admission: RE | Admit: 2017-04-27 | Discharge: 2017-04-27 | Disposition: A | Discharge status: Home / Self Care | Payer: 59 | Source: Ambulatory Visit | Attending: Orthopaedic Surgery | Admitting: Orthopaedic Surgery

## 2017-04-27 DIAGNOSIS — R600 Localized edema: Secondary | ICD-10-CM | POA: Diagnosis not present

## 2017-04-27 DIAGNOSIS — G8929 Other chronic pain: Secondary | ICD-10-CM

## 2017-04-27 DIAGNOSIS — M25512 Pain in left shoulder: Principal | ICD-10-CM

## 2017-05-01 ENCOUNTER — Ambulatory Visit (INDEPENDENT_AMBULATORY_CARE_PROVIDER_SITE_OTHER): Payer: 59 | Admitting: Orthopaedic Surgery

## 2017-05-01 ENCOUNTER — Encounter (INDEPENDENT_AMBULATORY_CARE_PROVIDER_SITE_OTHER): Payer: Self-pay | Admitting: Orthopaedic Surgery

## 2017-05-01 ENCOUNTER — Telehealth (INDEPENDENT_AMBULATORY_CARE_PROVIDER_SITE_OTHER): Payer: Self-pay | Admitting: Orthopaedic Surgery

## 2017-05-01 ENCOUNTER — Other Ambulatory Visit (INDEPENDENT_AMBULATORY_CARE_PROVIDER_SITE_OTHER): Payer: Self-pay | Admitting: *Deleted

## 2017-05-01 VITALS — BP 171/67 | HR 60 | Resp 18

## 2017-05-01 DIAGNOSIS — G8929 Other chronic pain: Secondary | ICD-10-CM

## 2017-05-01 DIAGNOSIS — M25512 Pain in left shoulder: Secondary | ICD-10-CM

## 2017-05-01 MED ORDER — METHYLPREDNISOLONE ACETATE 40 MG/ML IJ SUSP
80.0000 mg | INTRAMUSCULAR | Status: AC | PRN
  Administered 2017-05-01: 80 mg

## 2017-05-01 MED ORDER — LIDOCAINE HCL 1 % IJ SOLN
2.0000 mL | INTRAMUSCULAR | Status: AC | PRN
  Administered 2017-05-01: 2 mL

## 2017-05-01 MED ORDER — BUPIVACAINE HCL 0.5 % IJ SOLN
2.0000 mL | INTRAMUSCULAR | Status: AC | PRN
  Administered 2017-05-01: 2 mL via INTRA_ARTICULAR

## 2017-05-01 NOTE — Telephone Encounter (Signed)
Patient was seen this morning and wanted to confirm that he could continue his normal daily activities.  Patient also stated that he would like to get physical therapy at [facility]

## 2017-05-01 NOTE — Progress Notes (Signed)
Office Visit Note   Patient: Peter [name]           Date of Birth: 1955/05/30           MRN: [id number] Visit Date: 05/01/2017              Requested by: [name], MD [address] [place], [place] [address] PCP: [name], MD   Assessment & Plan: Visit Diagnoses:  1. Chronic left shoulder pain     Plan: MRI scan demonstrates tendinopathy in the supra and infraspinatus tendons.  Muscles were normal.  Long head biceps intact.  There was evidence of prior resection of the acromioclavicular joint but patient's never had prior surgery.  Subacromial and subdeltoid bursitis identified.  Mild degenerative change in the glenohumeral joint.  There is a labral tear extending from about the 8 o'clock position of the anterior and inferior labrum across the inferior labrum into the posterior labrum into the 10 o'clock position para labral cyst off the anterior inferior labrum is anterior to the neck of the glenoid and measured 0.6 x 1 cm.  Long discussion regarding the findings.  I think there are several options including another injection, physical therapy and surgery.  I am going to inject the subacromial region and the glenohumeral joint with cortisone, try physical therapy and then reevaluate in 6 weeks.  Might consider an arthroscopic SCD, DCR and then evaluate the labrum.  He is not having any symptoms of subluxation or dislocation  Follow-Up Instructions: Return in about 6 weeks (around 06/12/2017).   Orders:  Orders Placed This Encounter  Procedures  . Large Joint Inj: L glenohumeral   No orders of the defined types were placed in this encounter.     Procedures: Large Joint Inj: L glenohumeral on 05/01/2017 11:25 AM Details: 25 G 1.5 in needle, posterior approach  Arthrogram: No  Medications: 2 mL lidocaine 1 %; 2 mL bupivacaine 0.5 %; 80 mg methylPREDNISolone acetate 40 MG/ML Procedure, treatment alternatives, risks and benefits explained, specific  risks discussed. Consent was given by the patient. Immediately prior to procedure a time out was called to verify the correct patient, procedure, equipment, support staff and site/side marked as required. Patient was prepped and draped in the usual sterile fashion.       Clinical Data: No additional findings.   Subjective: Chief Complaint  Patient presents with  . Left Shoulder - Pain  . Follow-up    MRI review left shoulder  Returns for evaluation of the MRI scan.  Still having same symptoms in his left shoulder.  Also having some neck discomfort  HPI  Review of Systems  Constitutional: Negative for fatigue and fever.  HENT: Negative for ear pain.   Eyes: Positive for pain.  Respiratory: Negative for cough and shortness of breath.   Cardiovascular: Negative for leg swelling.  Gastrointestinal: Negative for constipation and diarrhea.  Genitourinary: Positive for difficulty urinating.  Musculoskeletal: Positive for back pain and neck pain.  Skin: Negative for rash.  Allergic/Immunologic: Negative for food allergies.  Neurological: Positive for weakness. Negative for numbness.  Hematological: Bruises/bleeds easily.     Objective: Vital Signs: BP (!) 171/67 (BP Location: Left Arm, Patient Position: Sitting, Cuff Size: Normal)   Pulse 60   Resp 18   Physical Exam  Constitutional: He is oriented to person, place, and time. He appears well-developed and well-nourished.  Eyes: Pupils are equal, round, and reactive to light. EOM are normal.  Neck:  Neck supple.  Neurological: He is alert and oriented to person, place, and time.  Skin: Skin is warm and dry.    Ortho Exam awake alert and oriented x3.  Comfortable sitting.  Some mild pain at the base of the cervical spine into the left with motion.  No referred pain to left shoulder.  Left shoulder with positive impingement and positive empty can testing.  Some pain at the acromioclavicular joint to palpation with and with a  crossarm test.  Biceps intact.  Skin intact.  No popping or clicking.  Able to easily place the  left arm directly overhead.  Good grip and good release.  Speeds sign negative.  Specialty Comments:  No specialty comments available.  Imaging: No results found.   PMFS History: Patient Active Problem List   Diagnosis Date Noted  . Allergic rhinitis 04/24/2017  . Thrombocytopenia ([place]) 01/23/2017  . Polypharmacy 01/20/2017  . Syncope 01/19/2017  . Elevated troponin 01/19/2017  . Sinusitis 06/08/2016  . Tremor 03/23/2016  . Paresthesia 03/23/2016  . Anxiety 02/23/2016  . Typical atrial flutter ([place]) 09/21/2015  . CAD (coronary artery disease) 05/06/2015  . Aortic atherosclerosis ([place]) 03/20/2015  . History of alcohol abuse 02/04/2015  . Alcohol induced fatty liver 02/04/2015  . Obesity (BMI 30.0-34.9) 02/04/2015  . Back pain 02/04/2015  . History of prostate cancer 02/04/2015  . Chronic venous insufficiency 10/12/2012  . Lesion of ulnar nerve 09/03/2012  . Tobacco abuse 04/05/2011  . Hyperlipidemia 01/21/2010  . Essential hypertension 12/16/2009  . Premature beats 12/16/2009   Past Medical History:  Diagnosis Date  . A-fib ([place])   . Abnormality of gait 09/03/2012  . Alcohol abuse   . Anxiety   . Arthritis   . Arthritis   . Esophageal stricture   . GERD (gastroesophageal reflux disease)   . Gout   . Hyperlipidemia   . Hypertension   . Internal hemorrhoids   . Lesion of ulnar nerve 09/03/2012   Right ulnar neuropathy  . Murmur, cardiac   . PAF (paroxysmal atrial fibrillation) ([place])   . Palpitations   . Prostate cancer ([place]) 2016   treated with radioactive seed implant  . Seizures ([place])    pt states he had seizure 12/14  . Tubular adenoma of colon 03/2008    Family History  Problem Relation Age of Onset  . Diabetes Father   . Kidney disease Father   . Lung cancer Father        smoker  . Hyperlipidemia Father   . Hypertension Father   . Lung cancer Mother         smoke  . Hypertension Mother   . Colon cancer Neg Hx   . Pancreatic cancer Neg Hx   . Stomach cancer Neg Hx     Past Surgical History:  Procedure Laterality Date  . CATARACT EXTRACTION W/PHACO Right 07/03/2014   Procedure: CATARACT EXTRACTION PHACO AND INTRAOCULAR LENS PLACEMENT (IOC);  Surgeon: Lyla Glassing, MD;  Location: [facility] ORS;  Service: Ophthalmology;  Laterality: Right;  lot # [id number] H [place]: 00:32.3 AP% 9.1 CDE: 2.92  . CIRCUMCISION    . COLONOSCOPY    . ELBOW SURGERY    . ELECTROPHYSIOLOGIC STUDY N/A 11/02/2015   Procedure: CARDIOVERSION;  Surgeon: Minna Merritts, MD;  Location: [facility] ORS;  Service: Cardiovascular;  Laterality: N/A;  . ESOPHAGOGASTRODUODENOSCOPY (EGD) WITH PROPOFOL N/A 05/17/2016   Procedure: ESOPHAGOGASTRODUODENOSCOPY (EGD) WITH PROPOFOL;  Surgeon: Lucilla Lame, MD;  Location: [facility] ENDOSCOPY;  Service: Endoscopy;  Laterality: N/A;  . EYE SURGERY    . HAND SURGERY Left   . SHOULDER SURGERY     Social History   Occupational History  . Occupation: Retired    Fish farm manager: Economist  . Occupation: UTILITIES OPER.    Employer: [employer]  Tobacco Use  . Smoking status: Current Every Day Smoker    Packs/day: 0.30    Years: 24.00    Pack years: 7.20    Types: Cigarettes  . Smokeless tobacco: Never Used  . Tobacco comment: down to 3 ciggs a day.  Substance and Sexual Activity  . Alcohol use: No    Frequency: Never  . Drug use: No    Types: Marijuana    Comment: 1970's occ.   Marland Kitchen Sexual activity: Not on file

## 2017-05-02 ENCOUNTER — Other Ambulatory Visit (INDEPENDENT_AMBULATORY_CARE_PROVIDER_SITE_OTHER): Payer: Self-pay | Admitting: Radiology

## 2017-05-02 NOTE — Telephone Encounter (Signed)
Ok to both 

## 2017-05-02 NOTE — Telephone Encounter (Signed)
Please advise 

## 2017-05-02 NOTE — Telephone Encounter (Signed)
Left message pt could resume regular activity and sent referral to [facility] for l shoulder pain

## 2017-05-03 DIAGNOSIS — J301 Allergic rhinitis due to pollen: Secondary | ICD-10-CM | POA: Diagnosis not present

## 2017-05-04 ENCOUNTER — Telehealth: Payer: Self-pay | Admitting: Gastroenterology

## 2017-05-04 NOTE — Telephone Encounter (Signed)
Please advise.  Thanks Holley Kocurek

## 2017-05-04 NOTE — Telephone Encounter (Signed)
If the patient is set up for labs already by me then we can add a BMP.  If he has not set up for any labs with [place] then he should talk to his primary care provider about any kidney issues he thinks he may be having.

## 2017-05-04 NOTE — Telephone Encounter (Signed)
Dr [name] ordered some blood work for patient. She stated he stopped drinking beer and started drinking diet soda and now feels he is having trouble with his kidney. If possible, he would like Dr. [name] to add some blood work for his kidney. Please call patient back and let him know. [phone] or the number on file.

## 2017-05-09 ENCOUNTER — Other Ambulatory Visit: Payer: Self-pay

## 2017-05-09 DIAGNOSIS — D539 Nutritional anemia, unspecified: Secondary | ICD-10-CM | POA: Diagnosis not present

## 2017-05-09 DIAGNOSIS — R748 Abnormal levels of other serum enzymes: Secondary | ICD-10-CM

## 2017-05-09 DIAGNOSIS — D696 Thrombocytopenia, unspecified: Secondary | ICD-10-CM | POA: Diagnosis not present

## 2017-05-09 DIAGNOSIS — J301 Allergic rhinitis due to pollen: Secondary | ICD-10-CM | POA: Diagnosis not present

## 2017-05-10 LAB — MITOCHONDRIAL ANTIBODIES

## 2017-05-10 LAB — HEPATITIS A ANTIBODY, TOTAL: Hep A Total Ab: POSITIVE — AB

## 2017-05-10 LAB — COMPREHENSIVE METABOLIC PANEL
ALBUMIN: 3.1 g/dL — AB (ref 3.6–4.8)
ALK PHOS: 173 IU/L — AB (ref 39–117)
ALT: 81 IU/L — ABNORMAL HIGH (ref 0–44)
AST: 120 IU/L — ABNORMAL HIGH (ref 0–40)
Albumin/Globulin Ratio: 0.9 — ABNORMAL LOW (ref 1.2–2.2)
BILIRUBIN TOTAL: 5.7 mg/dL — AB (ref 0.0–1.2)
BUN/Creatinine Ratio: 25 — ABNORMAL HIGH (ref 10–24)
BUN: 20 mg/dL (ref 8–27)
CHLORIDE: 101 mmol/L (ref 96–106)
CO2: 22 mmol/L (ref 20–29)
Calcium: 9.1 mg/dL (ref 8.6–10.2)
Creatinine, Ser: 0.8 mg/dL (ref 0.76–1.27)
GFR calc Af Amer: 112 mL/min/{1.73_m2} (ref >59)
GFR calc non Af Amer: 97 mL/min/{1.73_m2} (ref >59)
GLOBULIN, TOTAL: 3.6 g/dL (ref 1.5–4.5)
Glucose: 116 mg/dL — ABNORMAL HIGH (ref 65–99)
POTASSIUM: 4.4 mmol/L (ref 3.5–5.2)
SODIUM: 138 mmol/L (ref 134–144)
Total Protein: 6.7 g/dL (ref 6.0–8.5)

## 2017-05-10 LAB — ANTI-SMOOTH MUSCLE ANTIBODY, IGG: Smooth Muscle Ab: 20 Units — ABNORMAL HIGH (ref 0–19)

## 2017-05-10 LAB — ALPHA-1-ANTITRYPSIN: A-1 Antitrypsin: 149 mg/dL (ref 90–200)

## 2017-05-10 LAB — GAMMA GT: GGT: 335 IU/L — ABNORMAL HIGH (ref 0–65)

## 2017-05-10 LAB — AFP TUMOR MARKER: AFP, Serum, Tumor Marker: 4.4 ng/mL (ref 0.0–8.3)

## 2017-05-10 LAB — HEPATITIS B SURFACE ANTIGEN: Hepatitis B Surface Ag: NEGATIVE

## 2017-05-10 LAB — CERULOPLASMIN: CERULOPLASMIN: 23.9 mg/dL (ref 16.0–31.0)

## 2017-05-10 LAB — ANA: ANA: NEGATIVE

## 2017-05-11 ENCOUNTER — Telehealth: Payer: Self-pay

## 2017-05-11 NOTE — Telephone Encounter (Signed)
LVM for pt to return my call.

## 2017-05-11 NOTE — Telephone Encounter (Signed)
Patient has been informed message regarding lab results.  He stated that he stopped drinking completely 2 months ago and expected his liver enzymes to have improved not increased.  He also stated that he was concerned about his kidney function because his urine has been dark and has been experiencing some back pain.  Thanks [employer]

## 2017-05-11 NOTE — Telephone Encounter (Signed)
-----   Message from Lucilla Lame, MD sent at 05/10/2017  8:02 AM EDT ----- Let the patient know that his liver enzymes are again elevated and higher than before.  The pattern is consistent with severe liver disease and a pattern consistent with alcoholic hepatitis.  Please find out if the patient is drinking and encouraged him to stop please.

## 2017-05-12 NOTE — Telephone Encounter (Signed)
Pt advised. He will discuss elevated liver enzymes on Tuesday, May 7th during his office visit.

## 2017-05-12 NOTE — Telephone Encounter (Signed)
Let the patient know that I would expect the same. It should go down unless he is taking anything else that can hurt his liver such as NSAID's. He should talk to his PCP about his kidneys as that is not our area of expertise.  We should check the LFT's again next week and see if it may have just been a transient raise.

## 2017-05-15 DIAGNOSIS — M25512 Pain in left shoulder: Secondary | ICD-10-CM | POA: Diagnosis not present

## 2017-05-15 DIAGNOSIS — M6281 Muscle weakness (generalized): Secondary | ICD-10-CM | POA: Diagnosis not present

## 2017-05-15 DIAGNOSIS — M7542 Impingement syndrome of left shoulder: Secondary | ICD-10-CM | POA: Diagnosis not present

## 2017-05-16 ENCOUNTER — Ambulatory Visit: Payer: 59 | Admitting: Gastroenterology

## 2017-05-16 ENCOUNTER — Encounter: Payer: Self-pay | Admitting: Oncology

## 2017-05-16 ENCOUNTER — Encounter: Payer: Self-pay | Admitting: Gastroenterology

## 2017-05-16 ENCOUNTER — Inpatient Hospital Stay: Payer: 59 | Attending: Oncology | Admitting: Oncology

## 2017-05-16 VITALS — BP 177/73 | HR 54 | Temp 97.8°F | Resp 18 | Ht 72.0 in | Wt 237.0 lb

## 2017-05-16 VITALS — BP 178/70 | HR 52 | Ht 72.0 in | Wt 239.0 lb

## 2017-05-16 DIAGNOSIS — D638 Anemia in other chronic diseases classified elsewhere: Secondary | ICD-10-CM

## 2017-05-16 DIAGNOSIS — Z720 Tobacco use: Secondary | ICD-10-CM | POA: Insufficient documentation

## 2017-05-16 DIAGNOSIS — D649 Anemia, unspecified: Secondary | ICD-10-CM | POA: Insufficient documentation

## 2017-05-16 DIAGNOSIS — D696 Thrombocytopenia, unspecified: Secondary | ICD-10-CM | POA: Insufficient documentation

## 2017-05-16 DIAGNOSIS — R748 Abnormal levels of other serum enzymes: Secondary | ICD-10-CM

## 2017-05-16 DIAGNOSIS — K709 Alcoholic liver disease, unspecified: Secondary | ICD-10-CM

## 2017-05-16 DIAGNOSIS — K760 Fatty (change of) liver, not elsewhere classified: Secondary | ICD-10-CM | POA: Diagnosis not present

## 2017-05-16 NOTE — Progress Notes (Signed)
Primary Care Physician: [name], MD  Primary Gastroenterologist:  Dr. Lucilla Lame  Chief Complaint  Patient presents with  . Follow up lab results    HPI: Peter [name] is a 62 y.o. male here for follow-up of abnormal liver enzymes. The patient reports that he stopped drinking back in the beginning of March but recently had blood work that showed his liver enzymes to be higher than before with a GGT that was elevated.  The patient states that he is not having any alcohol intake and is only taking Tylenol intermittently and not to excess.  The patient also reports that he has a history of an esophageal stricture and has been having some intermittent dysphagia.  Current Outpatient Medications  Medication Sig Dispense Refill  . apixaban (ELIQUIS) 5 MG TABS tablet Take 5 mg by mouth 2 (two) times daily.    . B Complex-C (B-COMPLEX WITH VITAMIN C) tablet Take 1 tablet by mouth daily.    Marland Kitchen BYSTOLIC 20 MG TABS TAKE 1 TABLET (20 MG TOTAL) BY MOUTH DAILY. 90 tablet 2  . doxazosin (CARDURA) 2 MG tablet Take 1 tablet (2 mg total) by mouth daily. 60 tablet 6  . hydrOXYzine (ATARAX/VISTARIL) 50 MG tablet Take 50 mg by mouth at bedtime. Reported on 02/04/2015    . Melatonin 10 MG TABS Take 1 tablet by mouth at bedtime.    . milk thistle 175 MG tablet Take 175 mg by mouth daily.    . Multiple Vitamin (MULTIVITAMIN) tablet Take 1 tablet by mouth daily. Centrum Plus -Take 1 daily    . tamsulosin (FLOMAX) 0.4 MG CAPS capsule Take 0.4 mg by mouth daily.     . traMADol (ULTRAM) 50 MG tablet Take 1 tablet (50 mg total) by mouth every 12 (twelve) hours as needed for moderate pain. 30 tablet 0  . folic acid (FOLVITE) 1 MG tablet Take 1 tablet (1 mg total) by mouth daily. (Patient not taking: Reported on 05/16/2017) 30 tablet 1  . gabapentin (NEURONTIN) 300 MG capsule Take 1 capsule (300 mg total) by mouth 3 (three) times daily. 90 capsule 3  . triamcinolone cream (KENALOG) 0.1 % Apply 1  application topically daily as needed.     . VOLTAREN 1 % GEL Apply 2 g topically as needed (knees).      No current facility-administered medications for this visit.     Allergies as of 05/16/2017 - Review Complete 05/16/2017  Allergen Reaction Noted  . Clonazepam  01/30/2017  . Crestor [rosuvastatin] Other (See Comments) 12/25/2012  . Dust mite extract  10/10/2016  . Hydrocodone bitartrate er  07/01/2014  . Lipitor [atorvastatin] Other (See Comments) 05/01/2015  . Lisinopril Swelling 04/18/2017  . Topamax [topiramate]  04/26/2016    ROS:  General: Negative for anorexia, weight loss, fever, chills, fatigue, weakness. ENT: Negative for hoarseness, difficulty swallowing , nasal congestion. CV: Negative for chest pain, angina, palpitations, dyspnea on exertion, peripheral edema.  Respiratory: Negative for dyspnea at rest, dyspnea on exertion, cough, sputum, wheezing.  GI: See history of present illness. GU:  Negative for dysuria, hematuria, urinary incontinence, urinary frequency, nocturnal urination.  Endo: Negative for unusual weight change.    Physical Examination:   BP (!) 178/70   Pulse (!) 52   Ht 6' (1.829 m)   Wt 239 lb (108.4 kg)   BMI 32.41 kg/m   General: Well-nourished, well-developed in no acute distress.  Eyes: No icterus. Conjunctivae pink. Mouth: Oropharyngeal mucosa moist and  pink , no lesions erythema or exudate. Lungs: Clear to auscultation bilaterally. Non-labored. Heart: Irregular rate and rhythm, no murmurs rubs or gallops.  Abdomen: Bowel sounds are normal, nontender, nondistended, no hepatosplenomegaly or masses, no abdominal bruits or hernia , no rebound or guarding.   Extremities: No lower extremity edema. No clubbing or deformities. Neuro: Alert and oriented x 3.  Grossly intact. Skin: Warm and dry, no jaundice.   Psych: Alert and cooperative, normal mood and affect.  Labs:    Imaging Studies: Mr Shoulder Left W/o Contrast  Result Date:  04/27/2017 CLINICAL DATA:  Left shoulder pain and limited range of motion since March, 2019. No known injury. EXAM: MRI OF THE LEFT SHOULDER WITHOUT CONTRAST TECHNIQUE: Multiplanar, multisequence MR imaging of the shoulder was performed. No intravenous contrast was administered. COMPARISON:  MRI left shoulder 02/16/2007. FINDINGS: Rotator cuff: There is some heterogeneously increased T2 signal and thickening in the rotator cuff tendons consistent with tendinopathy without tear. Tendinopathy appears worst in the supraspinatus and infraspinatus. Muscles:  Normal without atrophy or focal lesion. Biceps long head:  Intact. Acromioclavicular Joint: The joint has been resected without evidence of complication. Type 1 acromion. Small volume of fluid is seen in the subacromial/subdeltoid bursa. Glenohumeral Joint: Mild degenerative change is seen. Labrum: Labral tear extends from approximately the 8 o'clock position of the anterior, inferior labrum across the inferior labrum and into the posterior labrum to approximately the 10 o'clock position. A paralabral cyst off the anterior, inferior labrum is anterior to the neck of the glenoid and measures 0.6 cm AP x 1 cm transverse x 1 cm craniocaudal. Edema in the adjacent subscapularis muscle belly may be due to strain or inflammatory change related to the cyst. Bones:  No fracture or worrisome lesion. Other: None. IMPRESSION: Rotator cuff tendinopathy most notable in the supraspinatus and infraspinatus without tear. New labral tear extends from the anterior, inferior labrum across the inferior labrum and into the posterior labrum as described above. There is a paralabral cyst off the anterior, inferior labrum. Edema in the adjacent subscapularis muscle belly may be due to leakage of cystic fluid or strain. Status post resection of the [facility] joint without evidence of complication. Electronically Signed   By: Inge Rise M.D.   On: 04/27/2017 15:20    Assessment and Plan:    Peter [name] is a 62 y.o. y/o male who has a history of EtOH liver disease and thrombocytopenia with a prolonged INR.  The patient has also been on blood thinners for his atrial fibrillation. The patient will have a CBC and INR checked today and if this is within normal limits the patient can be set up for an EGD with dilation of his previous esophageal stricture.  The patient will also have his LFTs checked in 1 month to make sure that his liver enzymes are coming down.  If the liver enzymes are not coming down the patient may need a liver biopsy to look for other causes of abnormal liver enzymes since he reports to have stopped drinking.    Lucilla Lame, MD. Marval Regal   Note: This dictation was prepared with Dragon dictation along with smaller phrase technology. Any transcriptional errors that result from this process are unintentional.

## 2017-05-16 NOTE — Progress Notes (Signed)
No new changes noted today 

## 2017-05-17 ENCOUNTER — Telehealth: Payer: Self-pay | Admitting: Gastroenterology

## 2017-05-17 ENCOUNTER — Telehealth: Payer: Self-pay

## 2017-05-17 NOTE — Telephone Encounter (Signed)
Patient LVM that he would like to talk to you regarding a medication he is taking that may be impacting his problem.  Please call

## 2017-05-17 NOTE — Telephone Encounter (Signed)
Pt called stating he forgot to mention this medication to you yesterday but he is currently taking   Praluent 150mg /ML - inject every 14 days.   He would like your thoughts on if you think this could be causing his liver enzymes to be elevated. He has been on this injectable med for a year.

## 2017-05-17 NOTE — Telephone Encounter (Signed)
Pt was notified of results during office visit of 05/16/17.

## 2017-05-17 NOTE — Telephone Encounter (Signed)
-----   Message from Lucilla Lame, MD sent at 05/17/2017  6:30 AM EDT ----- These patients labs came back except for the hepatitis B surface antibody which still says it is expected results.  Can you checked on why this has not come back yet. Thank you.

## 2017-05-18 DIAGNOSIS — R748 Abnormal levels of other serum enzymes: Secondary | ICD-10-CM | POA: Diagnosis not present

## 2017-05-18 DIAGNOSIS — J301 Allergic rhinitis due to pollen: Secondary | ICD-10-CM | POA: Diagnosis not present

## 2017-05-18 DIAGNOSIS — M7542 Impingement syndrome of left shoulder: Secondary | ICD-10-CM | POA: Diagnosis not present

## 2017-05-18 DIAGNOSIS — M25512 Pain in left shoulder: Secondary | ICD-10-CM | POA: Diagnosis not present

## 2017-05-18 DIAGNOSIS — M6281 Muscle weakness (generalized): Secondary | ICD-10-CM | POA: Diagnosis not present

## 2017-05-18 NOTE — Progress Notes (Signed)
Hematology/Oncology Consult note [facility]  [phone] [phone]  Patient Care Team: [name], MD as PCP - General (Family Medicine) Garald Balding, MD as Consulting Physician (Orthopedic Surgery) Minna Merritts, MD as Consulting Physician (Cardiology)   Name of the patient: Peter [name]  [id number]  20-Nov-1955   Date of visit: 05/18/17  Diagnosis- anemia due to chronic liver disease  Chief complaint/ Reason for visit- routine f/u of anemia   Heme/Onc history: patient is a 62 year old male who has been referred to [place] for evaluation and management of thrombocytopenia. Recent CBC from 01/23/2017 revealed a white count of 6.6, H&H of 9.7/26.4 with an MCV of 104 and a platelet count of 87. Of note patient has had mild to moderate thrombocytopenia since 2014. In 2014 his platelet count was 141 and drifted down to the 130s in 2017. Since May 2018 his platelet counts have been fluctuating between 50-80. Patient is also had long-standing macrocytosis and his MCV typically ranges from 104- 112. On reviewing his CMP patient's recent,total bilirubin was elevated at 3.4, AST elevated at 100. Since January 2017 he has had an elevated total bilirubin recent albumin was mildly low at 3.5.  Last MRI abdomen from 2015 had shown mild hepatic steatosis  Patient admits to drinking alcohol and says that he was a heavy drinker since the age of 89 but has slowed down recently. In the past he used to drink 12 packs of beer and down to 3-6 packs of beer per day recently Patient has seen [facility] in the past and has undergone EGD and colonoscopy. Recent upper endoscopy was in May 2018 which was done for melena and was normal.  Patient was seen by Dr. [name] from GI   Interval history- reports that he has quit drinking all together over last 1 month. No recent falls.   ECOG PS- 1 Pain scale- 0   Review of systems- Review of  Systems  Constitutional: Positive for malaise/fatigue. Negative for chills, fever and weight loss.  HENT: Negative for congestion, ear discharge and nosebleeds.   Eyes: Negative for blurred vision.  Respiratory: Negative for cough, hemoptysis, sputum production, shortness of breath and wheezing.   Cardiovascular: Negative for chest pain, palpitations, orthopnea and claudication.  Gastrointestinal: Negative for abdominal pain, blood in stool, constipation, diarrhea, heartburn, melena, nausea and vomiting.  Genitourinary: Negative for dysuria, flank pain, frequency, hematuria and urgency.  Musculoskeletal: Negative for back pain, joint pain and myalgias.  Skin: Negative for rash.  Neurological: Negative for dizziness, tingling, focal weakness, seizures, weakness and headaches.  Endo/Heme/Allergies: Does not bruise/bleed easily.  Psychiatric/Behavioral: Negative for depression and suicidal ideas. The patient does not have insomnia.       Allergies  Allergen Reactions  . Clonazepam     Syncope  . Crestor [Rosuvastatin] Other (See Comments)    Bloating, swelling of legs, fatty liver  . Dust Mite Extract   . Hydrocodone Bitartrate Er   . Lipitor [Atorvastatin] Other (See Comments)    Myalgias  . Lisinopril Swelling  . Topamax [Topiramate]     Cognitive clouding     Past Medical History:  Diagnosis Date  . A-fib ([place])   . Abnormality of gait 09/03/2012  . Alcohol abuse   . Anxiety   . Arthritis   . Arthritis   . Esophageal stricture   . GERD (gastroesophageal reflux disease)   . Gout   . Hyperlipidemia   . Hypertension   .  Internal hemorrhoids   . Lesion of ulnar nerve 09/03/2012   Right ulnar neuropathy  . Murmur, cardiac   . PAF (paroxysmal atrial fibrillation) ([place])   . Palpitations   . Prostate cancer ([place]) 2016   treated with radioactive seed implant  . Seizures ([place])    pt states he had seizure 12/14  . Tubular adenoma of colon 03/2008     Past Surgical History:   Procedure Laterality Date  . CATARACT EXTRACTION W/PHACO Right 07/03/2014   Procedure: CATARACT EXTRACTION PHACO AND INTRAOCULAR LENS PLACEMENT (IOC);  Surgeon: Lyla Glassing, MD;  Location: [facility] ORS;  Service: Ophthalmology;  Laterality: Right;  lot # [id number] H [place]: 00:32.3 AP% 9.1 CDE: 2.92  . CIRCUMCISION    . COLONOSCOPY    . ELBOW SURGERY    . ELECTROPHYSIOLOGIC STUDY N/A 11/02/2015   Procedure: CARDIOVERSION;  Surgeon: Minna Merritts, MD;  Location: [facility] ORS;  Service: Cardiovascular;  Laterality: N/A;  . ESOPHAGOGASTRODUODENOSCOPY (EGD) WITH PROPOFOL N/A 05/17/2016   Procedure: ESOPHAGOGASTRODUODENOSCOPY (EGD) WITH PROPOFOL;  Surgeon: Lucilla Lame, MD;  Location: [facility] ENDOSCOPY;  Service: Endoscopy;  Laterality: N/A;  . EYE SURGERY    . HAND SURGERY Left   . SHOULDER SURGERY      Social History   Socioeconomic History  . Marital status: Married    Spouse name: Not on file  . Number of children: 0  . Years of education: hs  . Highest education level: Not on file  Occupational History  . Occupation: Retired    Fish farm manager: Economist  . Occupation: UTILITIES OPER.    Employer: [employer]  Social Needs  . Financial resource strain: Not on file  . Food insecurity:    Worry: Not on file    Inability: Not on file  . Transportation needs:    Medical: Not on file    Non-medical: Not on file  Tobacco Use  . Smoking status: Current Every Day Smoker    Packs/day: 0.30    Years: 24.00    Pack years: 7.20    Types: Cigarettes  . Smokeless tobacco: Never Used  . Tobacco comment: down to 3 ciggs a day.  Substance and Sexual Activity  . Alcohol use: No    Frequency: Never  . Drug use: No    Types: Marijuana    Comment: 1970's occ.   Marland Kitchen Sexual activity: Not on file  Lifestyle  . Physical activity:    Days per week: Not on file    Minutes per session: Not on file  . Stress: Not on file  Relationships  . Social connections:    Talks on phone: Not on file    Gets  together: Not on file    Attends religious service: Not on file    Active member of club or organization: Not on file    Attends meetings of clubs or organizations: Not on file    Relationship status: Not on file  . Intimate partner violence:    Fear of current or ex partner: Not on file    Emotionally abused: Not on file    Physically abused: Not on file    Forced sexual activity: Not on file  Other Topics Concern  . Not on file  Social History Narrative   Lives with wife   Caffeine use: Drinks one cup coffee per morning   No soda or tea    Family History  Problem Relation Age of Onset  . Diabetes Father   . Kidney  disease Father   . Lung cancer Father        smoker  . Hyperlipidemia Father   . Hypertension Father   . Lung cancer Mother        smoke  . Hypertension Mother   . Colon cancer Neg Hx   . Pancreatic cancer Neg Hx   . Stomach cancer Neg Hx      Current Outpatient Medications:  .  apixaban (ELIQUIS) 5 MG TABS tablet, Take 5 mg by mouth 2 (two) times daily., Disp: , Rfl:  .  B Complex-C (B-COMPLEX WITH VITAMIN C) tablet, Take 1 tablet by mouth daily., Disp: , Rfl:  .  BYSTOLIC 20 MG TABS, TAKE 1 TABLET (20 MG TOTAL) BY MOUTH DAILY., Disp: 90 tablet, Rfl: 2 .  doxazosin (CARDURA) 2 MG tablet, Take 1 tablet (2 mg total) by mouth daily., Disp: 60 tablet, Rfl: 6 .  folic acid (FOLVITE) 1 MG tablet, Take 1 tablet (1 mg total) by mouth daily. (Patient not taking: Reported on 05/16/2017), Disp: 30 tablet, Rfl: 1 .  gabapentin (NEURONTIN) 300 MG capsule, Take 1 capsule (300 mg total) by mouth 3 (three) times daily., Disp: 90 capsule, Rfl: 3 .  hydrOXYzine (ATARAX/VISTARIL) 50 MG tablet, Take 50 mg by mouth at bedtime. Reported on 02/04/2015, Disp: , Rfl:  .  Multiple Vitamin (MULTIVITAMIN) tablet, Take 1 tablet by mouth daily. Centrum Plus -Take 1 daily, Disp: , Rfl:  .  tamsulosin (FLOMAX) 0.4 MG CAPS capsule, Take 0.4 mg by mouth daily. , Disp: , Rfl:  .  traMADol  (ULTRAM) 50 MG tablet, Take 1 tablet (50 mg total) by mouth every 12 (twelve) hours as needed for moderate pain., Disp: 30 tablet, Rfl: 0 .  Melatonin 10 MG TABS, Take 1 tablet by mouth at bedtime., Disp: , Rfl:  .  milk thistle 175 MG tablet, Take 175 mg by mouth daily., Disp: , Rfl:  .  triamcinolone cream (KENALOG) 0.1 %, Apply 1 application topically daily as needed. , Disp: , Rfl:  .  VOLTAREN 1 % GEL, Apply 2 g topically as needed (knees). , Disp: , Rfl:   Physical exam:  Vitals:   05/16/17 1414 05/16/17 1423  BP: (!) 177/73   Pulse: (!) 54   Resp: 18   Temp: 97.8 F (36.6 C)   TempSrc: Tympanic   SpO2: 100%   Weight: 237 lb (107.5 kg) 237 lb (107.5 kg)  Height: 6' (1.829 m)    Physical Exam  Constitutional: He is oriented to person, place, and time. He appears well-developed and well-nourished.  HENT:  Head: Normocephalic and atraumatic.  Mild scleral ictrus  Eyes: Pupils are equal, round, and reactive to light. EOM are normal.  Neck: Normal range of motion.  Cardiovascular: Normal rate, regular rhythm and normal heart sounds.  Pulmonary/Chest: Effort normal and breath sounds normal.  Abdominal: Soft. Bowel sounds are normal.  Neurological: He is alert and oriented to person, place, and time.  Resting tremor  Skin: Skin is warm and dry.  Palmar erythemia     CMP Latest Ref Rng & Units 05/09/2017  Glucose 65 - 99 mg/dL 116(H)  BUN 8 - 27 mg/dL 20  Creatinine 0.76 - 1.27 mg/dL 0.80  Sodium 134 - 144 mmol/L 138  Potassium 3.5 - 5.2 mmol/L 4.4  Chloride 96 - 106 mmol/L 101  CO2 20 - 29 mmol/L 22  Calcium 8.6 - 10.2 mg/dL 9.1  Total Protein 6.0 - 8.5 g/dL 6.7  Total  Bilirubin 0.0 - 1.2 mg/dL 5.7(H)  Alkaline Phos 39 - 117 IU/L 173(H)  AST 0 - 40 IU/L 120(H)  ALT 0 - 44 IU/L 81(H)   CBC Latest Ref Rng & Units 01/23/2017  WBC 3.4 - 10.8 x10E3/uL 6.6  Hemoglobin 13.0 - 17.7 g/dL 9.7(L)  Hematocrit 37.5 - 51.0 % 26.4(L)  Platelets 150 - 379 x10E3/uL 87(LL)    No  images are attached to the encounter.  Mr Shoulder Left W/o Contrast  Result Date: 04/27/2017 CLINICAL DATA:  Left shoulder pain and limited range of motion since March, 2019. No known injury. EXAM: MRI OF THE LEFT SHOULDER WITHOUT CONTRAST TECHNIQUE: Multiplanar, multisequence MR imaging of the shoulder was performed. No intravenous contrast was administered. COMPARISON:  MRI left shoulder 02/16/2007. FINDINGS: Rotator cuff: There is some heterogeneously increased T2 signal and thickening in the rotator cuff tendons consistent with tendinopathy without tear. Tendinopathy appears worst in the supraspinatus and infraspinatus. Muscles:  Normal without atrophy or focal lesion. Biceps long head:  Intact. Acromioclavicular Joint: The joint has been resected without evidence of complication. Type 1 acromion. Small volume of fluid is seen in the subacromial/subdeltoid bursa. Glenohumeral Joint: Mild degenerative change is seen. Labrum: Labral tear extends from approximately the 8 o'clock position of the anterior, inferior labrum across the inferior labrum and into the posterior labrum to approximately the 10 o'clock position. A paralabral cyst off the anterior, inferior labrum is anterior to the neck of the glenoid and measures 0.6 cm AP x 1 cm transverse x 1 cm craniocaudal. Edema in the adjacent subscapularis muscle belly may be due to strain or inflammatory change related to the cyst. Bones:  No fracture or worrisome lesion. Other: None. IMPRESSION: Rotator cuff tendinopathy most notable in the supraspinatus and infraspinatus without tear. New labral tear extends from the anterior, inferior labrum across the inferior labrum and into the posterior labrum as described above. There is a paralabral cyst off the anterior, inferior labrum. Edema in the adjacent subscapularis muscle belly may be due to leakage of cystic fluid or strain. Status post resection of the [facility] joint without evidence of complication. Electronically  Signed   By: Inge Rise M.D.   On: 04/27/2017 15:20     Assessment and plan- Patient is a 62 y.o. male referred for normocytic anemia likely anemia of chronic liver disease  Recent blood work from 05/09/2017 done at [facility] showed white count of 10.2, H&H of 11.6/32.2 with an MCV of 101 and a platelet count of 103.  This is improved from January 2019 when his hemoglobin was 9.7.  CMP in April 2019 showed elevated AST and ALT of 123 and 88 as well as a total bilirubin of 5.9.  In terms of his anemia work-up his ferritin was improved to 343.  He had a mildly elevated LDH of 260 which is nonspecific.  His prior [place] test has been negative when checked in February 2019.  He may have very mild component of hemolysis however his anemia is mainly due to his chronic liver disease.  If he continues to abstain from alcohol I suspect that his hemoglobin should improve.    Given that he has chronic liver disease I doubt that his platelets would normalize but they have remained stable at this point between 90-100  Patient is seeing multiple specialty providers and does not wish to follow-up with me at this time.  I think that is reasonable given that his anemia has improved and his thrombocytopenia is chronic.  He can continue to follow-up with Dr. Caryl Bis and get his CBC checked every 3 to 6 months.  He can be referred to [place] in the future if any questions or concerns arise   Visit Diagnosis 1. Chronic liver disease due to alcohol ([place])   2. Anemia of chronic disease   3. Thrombocytopenia ([place])      Dr. Randa Evens, MD, MPH [facility] [phone] 05/18/2017 9:41 AM

## 2017-05-19 LAB — CBC WITH DIFFERENTIAL/PLATELET
Basophils Absolute: 0.1 10*3/uL (ref 0.0–0.2)
Basos: 1 %
EOS (ABSOLUTE): 0.3 10*3/uL (ref 0.0–0.4)
EOS: 3 %
HEMOGLOBIN: 10.9 g/dL — AB (ref 13.0–17.7)
Hematocrit: 29.9 % — ABNORMAL LOW (ref 37.5–51.0)
IMMATURE GRANULOCYTES: 0 %
Immature Grans (Abs): 0 10*3/uL (ref 0.0–0.1)
LYMPHS: 48 %
Lymphocytes Absolute: 4.1 10*3/uL — ABNORMAL HIGH (ref 0.7–3.1)
MCH: 37.6 pg — ABNORMAL HIGH (ref 26.6–33.0)
MCHC: 36.5 g/dL — AB (ref 31.5–35.7)
MCV: 103 fL — ABNORMAL HIGH (ref 79–97)
MONOCYTES: 8 %
MONOS ABS: 0.7 10*3/uL (ref 0.1–0.9)
NEUTROS PCT: 40 %
Neutrophils Absolute: 3.5 10*3/uL (ref 1.4–7.0)
Platelets: 95 10*3/uL — CL (ref 150–379)
RBC: 2.9 x10E6/uL — AB (ref 4.14–5.80)
RDW: 17.6 % — ABNORMAL HIGH (ref 12.3–15.4)
WBC: 8.6 10*3/uL (ref 3.4–10.8)

## 2017-05-19 LAB — PROTIME-INR
INR: 1.4 — AB (ref 0.8–1.2)
PROTHROMBIN TIME: 14.6 s — AB (ref 9.1–12.0)

## 2017-05-23 DIAGNOSIS — M7542 Impingement syndrome of left shoulder: Secondary | ICD-10-CM | POA: Diagnosis not present

## 2017-05-23 DIAGNOSIS — M25512 Pain in left shoulder: Secondary | ICD-10-CM | POA: Diagnosis not present

## 2017-05-23 DIAGNOSIS — M6281 Muscle weakness (generalized): Secondary | ICD-10-CM | POA: Diagnosis not present

## 2017-05-26 ENCOUNTER — Telehealth: Payer: Self-pay

## 2017-05-26 NOTE — Telephone Encounter (Signed)
Pt notified of lab results

## 2017-05-26 NOTE — Telephone Encounter (Signed)
-----   Message from Lucilla Lame, MD sent at 05/21/2017  8:03 AM EDT ----- Let the patient know the blood count is better but the platelets and clotting time is still too abnormal to dilate the esophagus. The patient should also be started on b12 pills and folate.

## 2017-05-29 DIAGNOSIS — M6281 Muscle weakness (generalized): Secondary | ICD-10-CM | POA: Diagnosis not present

## 2017-05-29 DIAGNOSIS — M7542 Impingement syndrome of left shoulder: Secondary | ICD-10-CM | POA: Diagnosis not present

## 2017-05-29 DIAGNOSIS — J301 Allergic rhinitis due to pollen: Secondary | ICD-10-CM | POA: Diagnosis not present

## 2017-05-29 DIAGNOSIS — M25512 Pain in left shoulder: Secondary | ICD-10-CM | POA: Diagnosis not present

## 2017-05-30 ENCOUNTER — Ambulatory Visit (INDEPENDENT_AMBULATORY_CARE_PROVIDER_SITE_OTHER): Payer: 59 | Admitting: Orthopaedic Surgery

## 2017-05-30 ENCOUNTER — Encounter (INDEPENDENT_AMBULATORY_CARE_PROVIDER_SITE_OTHER): Payer: Self-pay | Admitting: Orthopaedic Surgery

## 2017-05-30 VITALS — BP 159/63 | HR 49 | Ht 72.0 in | Wt 231.0 lb

## 2017-05-30 DIAGNOSIS — M25512 Pain in left shoulder: Secondary | ICD-10-CM | POA: Diagnosis not present

## 2017-05-30 DIAGNOSIS — G8929 Other chronic pain: Secondary | ICD-10-CM

## 2017-05-30 NOTE — Progress Notes (Signed)
Office Visit Note   Patient: Peter [name]           Date of Birth: January 18, 1955           MRN: [id number] Visit Date: 05/30/2017              Requested by: [name], MD [address] [place], [place] [address] PCP: [name], MD   Assessment & Plan: Visit Diagnoses:  1. Chronic left shoulder pain     Plan: There is a chronic problem with his left shoulder as previously outlined.  He had an MRI scan that reveals some rotator cuff tendinopathy but does have evidence of anterior labral tear.  He has had subacromial and intra-articular cortisone injection and a course of physical therapy without much relief.  He is compromised.  At this point I would suggest an arthroscopic procedure to include an SCD, DCR and debride the labral tear.  He will need cardiac clearance as he is on Eliquis for atrial fib and I discussed the surgery with him the procedure expected postoperative course, sling immobilization, need for physical therapy.  There is certainly a chance that he will not have complete relief of his pain  Follow-Up Instructions: Return will schedule surgery left shoulder.   Orders:  No orders of the defined types were placed in this encounter.  No orders of the defined types were placed in this encounter.     Procedures: No procedures performed   Clinical Data: No additional findings.   Subjective: Chief Complaint  Patient presents with  . Left Shoulder - Pain  . Follow-up    L SHOULDER PAIN, SHOT LASTED 24 HRS, PT TRIED PT BUT SEEMED TO MAKE IT WORSE  Peter [name] continues to have pain in his left shoulder despite time, cortisone and physical therapy.  He is not having any numbness or tingling.  And very few symptoms referable to the cervical spine  HPI  Review of Systems  Constitutional: Negative for fatigue and fever.  HENT: Negative for ear pain.   Eyes: Negative for pain.  Respiratory: Negative for cough and shortness of breath.     Cardiovascular: Negative for leg swelling.  Gastrointestinal: Negative for constipation and diarrhea.  Genitourinary: Positive for difficulty urinating.  Musculoskeletal: Positive for back pain and neck pain.  Skin: Negative for rash.  Allergic/Immunologic: Negative for food allergies.  Hematological: Bruises/bleeds easily.  Psychiatric/Behavioral: Positive for sleep disturbance.     Objective: Vital Signs: BP (!) 159/63 (BP Location: Left Arm, Patient Position: Sitting, Cuff Size: Normal)   Pulse (!) 49   Ht 6' (1.829 m)   Wt 231 lb (104.8 kg)   BMI 31.33 kg/m   Physical Exam  Constitutional: He is oriented to person, place, and time. He appears well-developed and well-nourished.  HENT:  Mouth/Throat: Oropharynx is clear and moist.  Eyes: Pupils are equal, round, and reactive to light. EOM are normal.  Pulmonary/Chest: Effort normal.  Neurological: He is alert and oriented to person, place, and time.  Skin: Skin is warm and dry.  Psychiatric: He has a normal mood and affect. His behavior is normal.    Ortho Exam awake alert and oriented x3.  Comfortable sitting.  Positive impingement and positive empty can testing of left shoulder.  Able to place his arm over his head with some discomfort.  Some tenderness along the anterior and lateral subacromial region with abduction.  Minimal discomfort at the acromioclavicular joint.  Biceps intact.  Some bruising of the left forearm related to his Eliquis and recent yardwork good grip and good release.  Specialty Comments:  No specialty comments available.  Imaging: No results found.   PMFS History: Patient Active Problem List   Diagnosis Date Noted  . Allergic rhinitis 04/24/2017  . Thrombocytopenia ([place]) 01/23/2017  . Polypharmacy 01/20/2017  . Syncope 01/19/2017  . Elevated troponin 01/19/2017  . Sinusitis 06/08/2016  . Tremor 03/23/2016  . Paresthesia 03/23/2016  . Anxiety 02/23/2016  . Typical atrial flutter ([place])  09/21/2015  . CAD (coronary artery disease) 05/06/2015  . Aortic atherosclerosis ([place]) 03/20/2015  . History of alcohol abuse 02/04/2015  . Alcohol induced fatty liver 02/04/2015  . Obesity (BMI 30.0-34.9) 02/04/2015  . Back pain 02/04/2015  . History of prostate cancer 02/04/2015  . Chronic venous insufficiency 10/12/2012  . Lesion of ulnar nerve 09/03/2012  . Tobacco abuse 04/05/2011  . Hyperlipidemia 01/21/2010  . Essential hypertension 12/16/2009  . Premature beats 12/16/2009   Past Medical History:  Diagnosis Date  . A-fib ([place])   . Abnormality of gait 09/03/2012  . Alcohol abuse   . Anxiety   . Arthritis   . Arthritis   . Esophageal stricture   . GERD (gastroesophageal reflux disease)   . Gout   . Hyperlipidemia   . Hypertension   . Internal hemorrhoids   . Lesion of ulnar nerve 09/03/2012   Right ulnar neuropathy  . Murmur, cardiac   . PAF (paroxysmal atrial fibrillation) ([place])   . Palpitations   . Prostate cancer ([place]) 2016   treated with radioactive seed implant  . Seizures ([place])    pt states he had seizure 12/14  . Tubular adenoma of colon 03/2008    Family History  Problem Relation Age of Onset  . Diabetes Father   . Kidney disease Father   . Lung cancer Father        smoker  . Hyperlipidemia Father   . Hypertension Father   . Lung cancer Mother        smoke  . Hypertension Mother   . Colon cancer Neg Hx   . Pancreatic cancer Neg Hx   . Stomach cancer Neg Hx     Past Surgical History:  Procedure Laterality Date  . CATARACT EXTRACTION W/PHACO Right 07/03/2014   Procedure: CATARACT EXTRACTION PHACO AND INTRAOCULAR LENS PLACEMENT (IOC);  Surgeon: Lyla Glassing, MD;  Location: [facility] ORS;  Service: Ophthalmology;  Laterality: Right;  lot # [id number] H [place]: 00:32.3 AP% 9.1 CDE: 2.92  . CIRCUMCISION    . COLONOSCOPY    . ELBOW SURGERY    . ELECTROPHYSIOLOGIC STUDY N/A 11/02/2015   Procedure: CARDIOVERSION;  Surgeon: Minna Merritts, MD;  Location: [facility]  ORS;  Service: Cardiovascular;  Laterality: N/A;  . ESOPHAGOGASTRODUODENOSCOPY (EGD) WITH PROPOFOL N/A 05/17/2016   Procedure: ESOPHAGOGASTRODUODENOSCOPY (EGD) WITH PROPOFOL;  Surgeon: Lucilla Lame, MD;  Location: [facility] ENDOSCOPY;  Service: Endoscopy;  Laterality: N/A;  . EYE SURGERY    . HAND SURGERY Left   . SHOULDER SURGERY     Social History   Occupational History  . Occupation: Retired    Fish farm manager: Economist  . Occupation: UTILITIES OPER.    Employer: [employer]  Tobacco Use  . Smoking status: Current Every Day Smoker    Packs/day: 0.30    Years: 24.00    Pack years: 7.20    Types: Cigarettes  . Smokeless tobacco: Never Used  . Tobacco comment: down to 3 ciggs a day.  Substance and Sexual Activity  . Alcohol use: No    Frequency: Never  . Drug use: No    Types: Marijuana    Comment: 1970's occ.   Marland Kitchen Sexual activity: Not on file

## 2017-06-01 ENCOUNTER — Telehealth: Payer: Self-pay | Admitting: Cardiovascular Disease

## 2017-06-01 NOTE — Telephone Encounter (Signed)
Patient came by to drop off clearance form     [facility] Pre-operative Risk Assessment    Request for surgical clearance:  1. What type of surgery is being performed? Out patient - left shoulder   2. When is this surgery scheduled?    3. What type of clearance is required (medical clearance vs. Pharmacy clearance to hold med vs. Both)? Both  4. Are there any medications that need to be held prior to surgery and how long? Eliquis would like to stop for 5 days prior - needs to know when to start back   5. Practice name and name of physician performing surgery? [facility]   6. What is your office phone number [phone]    7.   What is your office fax number [phone] and/or [phone]  8.   Anesthesia type (None, local, MAC, general) ?    Peter [name] 06/01/2017, 10:12 AM  _________________________________________________________________   (provider comments below)

## 2017-06-01 NOTE — Telephone Encounter (Signed)
Moderate but acceptable risk for surgery He can stop his anticoagulation prior to surgery, Typically needs 2 -3 days  Up to the surgeon whether he needs 5 days

## 2017-06-01 NOTE — Telephone Encounter (Signed)
Wants patient to stop Eliquis 5 days prior. Saw Dr Rockey Situ last 01/30/17.  S/w patient. He denies any new shortness of breath or chest pain. States he's lost about 20 lb, not drinking any alcohol, and cut back to 2 cigarettes a day.   Routing to Dr Rockey Situ for clearance.

## 2017-06-02 NOTE — Telephone Encounter (Signed)
Copy of phone note with statement of clearance faxed to [facility] at [phone]. Confirmation received.   I left a message on the patient's identified voice mail that his clearance has been faxed and advised on recommendations from Dr. Rockey Situ regarding his blood thinner.

## 2017-06-08 ENCOUNTER — Encounter: Payer: Self-pay | Admitting: Orthopaedic Surgery

## 2017-06-08 ENCOUNTER — Telehealth (INDEPENDENT_AMBULATORY_CARE_PROVIDER_SITE_OTHER): Payer: Self-pay | Admitting: Orthopaedic Surgery

## 2017-06-08 DIAGNOSIS — M7542 Impingement syndrome of left shoulder: Secondary | ICD-10-CM | POA: Diagnosis not present

## 2017-06-08 DIAGNOSIS — M19012 Primary osteoarthritis, left shoulder: Secondary | ICD-10-CM | POA: Diagnosis not present

## 2017-06-08 DIAGNOSIS — M24112 Other articular cartilage disorders, left shoulder: Secondary | ICD-10-CM | POA: Diagnosis not present

## 2017-06-08 DIAGNOSIS — G8918 Other acute postprocedural pain: Secondary | ICD-10-CM | POA: Diagnosis not present

## 2017-06-08 DIAGNOSIS — S43432A Superior glenoid labrum lesion of left shoulder, initial encounter: Secondary | ICD-10-CM | POA: Diagnosis not present

## 2017-06-08 NOTE — Telephone Encounter (Signed)
corrected

## 2017-06-08 NOTE — Telephone Encounter (Signed)
PLEASE ADVISE.

## 2017-06-08 NOTE — Telephone Encounter (Signed)
[facility] left a message stating that they need the strength of the OxyContin that Dr. Durward Fortes prescribed for the patient.  He stated that it was left off the prescription.  CB#[id number].  Thank you.

## 2017-06-12 ENCOUNTER — Ambulatory Visit (INDEPENDENT_AMBULATORY_CARE_PROVIDER_SITE_OTHER): Payer: 59 | Admitting: Orthopaedic Surgery

## 2017-06-12 ENCOUNTER — Other Ambulatory Visit: Payer: Self-pay | Admitting: Family Medicine

## 2017-06-12 ENCOUNTER — Encounter (INDEPENDENT_AMBULATORY_CARE_PROVIDER_SITE_OTHER): Payer: Self-pay | Admitting: Orthopaedic Surgery

## 2017-06-12 VITALS — BP 169/64 | HR 54 | Ht 72.0 in | Wt 231.0 lb

## 2017-06-12 DIAGNOSIS — G8929 Other chronic pain: Secondary | ICD-10-CM | POA: Insufficient documentation

## 2017-06-12 DIAGNOSIS — M25512 Pain in left shoulder: Secondary | ICD-10-CM

## 2017-06-12 NOTE — Progress Notes (Signed)
Office Visit Note   Patient: Peter [name]           Date of Birth: [birth year]           MRN: [id number] Visit Date: 06/12/2017              Requested by: [name], MD [address] [place], [place] [address] PCP: [name], MD   Assessment & Plan: Visit Diagnoses:  1. Chronic left shoulder pain     Plan: 4 days status post arthroscopic SCD, DCR debridement of anterior inferior labral tear left shoulder.  Doing well.  Has been performing circumduction exercises out of the sling.  Will start physical therapy next week continue to wear the sling for comfort office 2 weeks  Follow-Up Instructions: Return in about 2 weeks (around 06/26/2017).   Orders:  Orders Placed This Encounter  Procedures  . Ambulatory referral to Physical Therapy   No orders of the defined types were placed in this encounter.     Procedures: No procedures performed   Clinical Data: No additional findings.   Subjective: Chief Complaint  Patient presents with  . Left Shoulder - Routine Post Op, Pain  . Follow-up    06/08/17 LEFT SHOULDER SURGERY, HAVING SOME PAIN AND WEAKNESS BUT NO NUMBNESS    HPI  Review of Systems  Constitutional: Negative for fatigue and fever.  HENT: Negative for ear pain.   Eyes: Negative for pain.  Respiratory: Negative for cough and shortness of breath.   Cardiovascular: Positive for leg swelling.  Gastrointestinal: Negative for constipation and diarrhea.  Genitourinary: Negative for difficulty urinating.  Musculoskeletal: Positive for back pain and neck pain.  Skin: Negative for rash.  Allergic/Immunologic: Negative for food allergies.  Neurological: Positive for weakness. Negative for numbness.  Hematological: Bruises/bleeds easily.  Psychiatric/Behavioral: Positive for sleep disturbance.     Objective: Vital Signs: BP (!) 169/64 (BP Location: Left Arm, Patient Position: Sitting, Cuff Size: Normal)   Pulse (!) 54   Ht 6'  (1.829 m)   Wt 231 lb (104.8 kg)   BMI 31.33 kg/m   Physical Exam  Ortho Exam awake alert and oriented x3.  Comfortable sitting.  I remove the stitches from his shoulder arthroscopic portals and applied waterproof Band-Aids.  Neurovascular exam intact.  Biceps intact.  Skin otherwise intact. No Evidence of an infection  Specialty Comments:  No specialty comments available.  Imaging: No results found.   PMFS History: Patient Active Problem List   Diagnosis Date Noted  . Chronic left shoulder pain 06/12/2017  . Allergic rhinitis 04/24/2017  . Thrombocytopenia ([place]) 01/23/2017  . Polypharmacy 01/20/2017  . Syncope 01/19/2017  . Elevated troponin 01/19/2017  . Sinusitis 06/08/2016  . Tremor 03/23/2016  . Paresthesia 03/23/2016  . Anxiety 02/23/2016  . Typical atrial flutter ([place]) 09/21/2015  . CAD (coronary artery disease) 05/06/2015  . Aortic atherosclerosis ([place]) 03/20/2015  . History of alcohol abuse 02/04/2015  . Alcohol induced fatty liver 02/04/2015  . Obesity (BMI 30.0-34.9) 02/04/2015  . Back pain 02/04/2015  . History of prostate cancer 02/04/2015  . Chronic venous insufficiency 10/12/2012  . Lesion of ulnar nerve 09/03/2012  . Tobacco abuse 04/05/2011  . Hyperlipidemia 01/21/2010  . Essential hypertension 12/16/2009  . Premature beats 12/16/2009   Past Medical History:  Diagnosis Date  . A-fib ([place])   . Abnormality of gait 09/03/2012  . Alcohol abuse   . Anxiety   . Arthritis   . Arthritis   .  Esophageal stricture   . GERD (gastroesophageal reflux disease)   . Gout   . Hyperlipidemia   . Hypertension   . Internal hemorrhoids   . Lesion of ulnar nerve 09/03/2012   Right ulnar neuropathy  . Murmur, cardiac   . PAF (paroxysmal atrial fibrillation) ([place])   . Palpitations   . Prostate cancer ([place]) 2016   treated with radioactive seed implant  . Seizures ([place])    pt states he had seizure 12/14  . Tubular adenoma of colon 03/2008    Family History    Problem Relation Age of Onset  . Diabetes Father   . Kidney disease Father   . Lung cancer Father        smoker  . Hyperlipidemia Father   . Hypertension Father   . Lung cancer Mother        smoke  . Hypertension Mother   . Colon cancer Neg Hx   . Pancreatic cancer Neg Hx   . Stomach cancer Neg Hx     Past Surgical History:  Procedure Laterality Date  . CATARACT EXTRACTION W/PHACO Right 07/03/2014   Procedure: CATARACT EXTRACTION PHACO AND INTRAOCULAR LENS PLACEMENT (IOC);  Surgeon: Lyla Glassing, MD;  Location: [facility] ORS;  Service: Ophthalmology;  Laterality: Right;  lot # [id number] H [place]: 00:32.3 AP% 9.1 CDE: 2.92  . CIRCUMCISION    . COLONOSCOPY    . ELBOW SURGERY    . ELECTROPHYSIOLOGIC STUDY N/A 11/02/2015   Procedure: CARDIOVERSION;  Surgeon: Minna Merritts, MD;  Location: [facility] ORS;  Service: Cardiovascular;  Laterality: N/A;  . ESOPHAGOGASTRODUODENOSCOPY (EGD) WITH PROPOFOL N/A 05/17/2016   Procedure: ESOPHAGOGASTRODUODENOSCOPY (EGD) WITH PROPOFOL;  Surgeon: Lucilla Lame, MD;  Location: [facility] ENDOSCOPY;  Service: Endoscopy;  Laterality: N/A;  . EYE SURGERY    . HAND SURGERY Left   . SHOULDER SURGERY     Social History   Occupational History  . Occupation: Retired    Fish farm manager: Economist  . Occupation: UTILITIES OPER.    Employer: [employer]  Tobacco Use  . Smoking status: Current Every Day Smoker    Packs/day: 0.30    Years: 24.00    Pack years: 7.20    Types: Cigarettes  . Smokeless tobacco: Never Used  . Tobacco comment: down to 3 ciggs a day.  Substance and Sexual Activity  . Alcohol use: No    Frequency: Never  . Drug use: No    Types: Marijuana    Comment: 1970's occ.   Marland Kitchen Sexual activity: Not on file

## 2017-06-13 NOTE — Telephone Encounter (Signed)
Last OV 04/24/17 last filled 04/24/17 30 0rf

## 2017-06-19 ENCOUNTER — Telehealth (INDEPENDENT_AMBULATORY_CARE_PROVIDER_SITE_OTHER): Payer: Self-pay | Admitting: Orthopaedic Surgery

## 2017-06-19 NOTE — Telephone Encounter (Signed)
refaxed referral and office note and notified pt.

## 2017-06-19 NOTE — Telephone Encounter (Signed)
Patient called stating he scheduled an appointment with Reather Converse for physical therapy to begin on 6/18.  Patient states they are still waiting for the frequency and duration, as well as notes from his shoulder surgery.

## 2017-06-21 DIAGNOSIS — J301 Allergic rhinitis due to pollen: Secondary | ICD-10-CM | POA: Diagnosis not present

## 2017-06-29 DIAGNOSIS — M6281 Muscle weakness (generalized): Secondary | ICD-10-CM | POA: Diagnosis not present

## 2017-06-29 DIAGNOSIS — M25512 Pain in left shoulder: Secondary | ICD-10-CM | POA: Diagnosis not present

## 2017-06-29 DIAGNOSIS — M25612 Stiffness of left shoulder, not elsewhere classified: Secondary | ICD-10-CM | POA: Diagnosis not present

## 2017-07-04 ENCOUNTER — Encounter

## 2017-07-04 ENCOUNTER — Ambulatory Visit: Payer: 59 | Admitting: Neurology

## 2017-07-04 DIAGNOSIS — M25512 Pain in left shoulder: Secondary | ICD-10-CM | POA: Diagnosis not present

## 2017-07-04 DIAGNOSIS — M6281 Muscle weakness (generalized): Secondary | ICD-10-CM | POA: Diagnosis not present

## 2017-07-04 DIAGNOSIS — M25612 Stiffness of left shoulder, not elsewhere classified: Secondary | ICD-10-CM | POA: Diagnosis not present

## 2017-07-06 DIAGNOSIS — J301 Allergic rhinitis due to pollen: Secondary | ICD-10-CM | POA: Diagnosis not present

## 2017-07-07 DIAGNOSIS — J301 Allergic rhinitis due to pollen: Secondary | ICD-10-CM | POA: Diagnosis not present

## 2017-07-09 NOTE — Progress Notes (Signed)
Cardiology Office Note  Date:  07/11/2017   ID:  Peter [name], DOB [birth year], MRN [id number]  PCP:  Peter [name]   Chief Complaint  Patient presents with  . OTHER    6 month f/u Pt had recent left shoulder arthroscopy c/o left forearm numbness and elevated BP. Meds reviewed verbally with pt.    HPI:  Peter [name] is a 62 year old gentleman with  long history of smoking, 4 cigs a day hypertension,  daily alcohol drinking,  prostate cancer ,  statin intolerance ,    underlying liver dysfunction, elevated total bilirubin CAD, CT coronary calcium score 2778 in 04/2015 Stress test 07/2014 no ischemia Cardioversion 11/02/2015 For atrial flutter tolerating praluent , dramatic improvement in his cholesterol down to 160 from 260 who presents for follow-up of his coronary artery disease, hypertension, hyperlipidemia  In follow-up he reports that he has been doing relatively well Reports he was unable to tolerate lisinopril, "caused leg swelling", stopped the medication He is taking bystolic in the morning Also taking Cardura 2 mg in the evening (1/4 of an 8 mg pill)  BP elevated today, 180 even on my recheck Reports his blood pressures typically Typically 423 systolic t home  Weight down 15 pounds in 6 months Eating less  Denies any further falls previous episode of fall, possible loss of consciousness Was drinking alcohol at home, took clonazepam (this was provided for tremor) Passed out, hit his face on the left Was in the hospital, blood thinners held overnight 01/19/2017  anticoagulation held until platelets improved  hemoglobin of 9 down from  13    No regular exercise program Denies any chest pain concerning for angina  EKG personally reviewed by myself on todays visit Shows normal sinus rhythm rate 58 bpm no significant ST or T-wave changes  other past medical history reviewed Echo January 2019  EF normal >65%  Previous EGD with Peter [name],  Essentially  normal procedure, no sign of bleeding  echocardiogram 02/2016 showing normal LV function  Chronic back pain He went to the ER for back pain 11/22/2015 Started On tramadol PRN  Limited by back pain , was told he could get a "shot" "shaking" for 2 years since prostate seeds, stopped librium Still with heavy ETOH, previous notes indicating 4-5 beers per day Frequent nocturia Has been taking lisinopril 40 mg x 2, presumably because blood pressure was running high, now running out of his medication. Did not call our office  Previous stress test July 2016 showing no ischemia CT scan of the chest, CT coronary calcium score discussed with him in detail, images pulled up in the office. His scores close to 3000. He denies any anginal symptoms. Images show diffuse heavy calcification/calcified atherosclerosis in the LAD, diagonal branch, ostial and proximal RCA. No significant disease noted in the left circumflex.   he reports being debilitated when he was on Crestor, Lipitor He had diffuse muscle pain in his legs, has tried different doses, does not want to retry a statin given the debilitating side effects. Feels it made it impossible to get around, on top of his arthritis, severe knee pain, it was just too much  CT scan of the abdomen shows scattered diffuse mild aortic atherosclerosis.  Lab work reviewed with him showing hemoglobin A1c 5.5 , total cholesterol more than 230   PMH:   has a past medical history of A-fib (Peter [name]), Abnormality of gait (09/03/2012), Alcohol abuse, Anxiety, Arthritis, Arthritis, Esophageal stricture, GERD (gastroesophageal reflux disease),  Gout, Hyperlipidemia, Hypertension, Internal hemorrhoids, Lesion of ulnar nerve (09/03/2012), Murmur, cardiac, PAF (paroxysmal atrial fibrillation) (Peter [name]), Palpitations, Prostate cancer (Peter [name]) (2016), Seizures (Peter [name]), and Tubular adenoma of colon (03/2008).  PSH:    Past Surgical History:  Procedure Laterality Date  . CATARACT  EXTRACTION W/PHACO Right 07/03/2014   Procedure: CATARACT EXTRACTION PHACO AND INTRAOCULAR LENS PLACEMENT (IOC);  Surgeon: Peter Glassing, [name];  Location: [facility] ORS;  Service: Ophthalmology;  Laterality: Right;  lot # [id number] H [place]: 00:32.3 AP% 9.1 CDE: 2.92  . CIRCUMCISION    . COLONOSCOPY    . ELBOW SURGERY    . ELECTROPHYSIOLOGIC STUDY N/A 11/02/2015   Procedure: CARDIOVERSION;  Surgeon: Peter Merritts, [name];  Location: [facility] ORS;  Service: Cardiovascular;  Laterality: N/A;  . ESOPHAGOGASTRODUODENOSCOPY (EGD) WITH PROPOFOL N/A 05/17/2016   Procedure: ESOPHAGOGASTRODUODENOSCOPY (EGD) WITH PROPOFOL;  Surgeon: Peter Lame, [name];  Location: [facility] END[facility]COPY;  Service: Endoscopy;  Laterality: N/A;  . EYE SURGERY    . HAND SURGERY Left   . SHOULDER ARTHROSCOPY Left   . SHOULDER SURGERY      Current Outpatient Medications  Medication Sig Dispense Refill  . apixaban (ELIQUIS) 5 MG TABS tablet Take 5 mg by mouth 2 (two) times daily.    . B Complex-C (B-COMPLEX WITH VITAMIN C) tablet Take 1 tablet by mouth daily.    Marland Kitchen BYSTOLIC 20 MG TABS TAKE 1 TABLET (20 MG TOTAL) BY MOUTH DAILY. 90 tablet 2  . doxazosin (CARDURA) 2 MG tablet Take 1 tablet (2 mg total) by mouth daily. (Patient taking differently: Take 0.25 mg by mouth daily. ) 60 tablet 6  . FOLIC ACID PO Take by mouth daily.    . Melatonin 10 MG TABS Take 1 tablet by mouth at bedtime.    . milk thistle 175 MG tablet Take 175 mg by mouth daily.    . Multiple Vitamin (MULTIVITAMIN) tablet Take 1 tablet by mouth daily. Centrum Plus -Take 1 daily    . oxyCODONE (OXY IR/ROXICODONE) 5 MG immediate release tablet as needed.     . tamsulosin (FLOMAX) 0.4 MG CAPS capsule Take 0.4 mg by mouth daily.     . traMADol (ULTRAM) 50 MG tablet TAKE ONE TABLET BY MOUTH EVERY 12 HOURS AS NEEDED FOR MODERATE PAIN 30 tablet 0  . triamcinolone cream (KENALOG) 0.1 % Apply 1 application topically daily as needed.     . VOLTAREN 1 % GEL Apply 2 g topically as needed (knees).       No current facility-administered medications for this visit.      Allergies:   Clonazepam; Crestor [rosuvastatin]; Dust mite extract; Hydrocodone bitartrate er; Lipitor [atorvastatin]; Lisinopril; and Topamax [topiramate]   Social History:  The patient  reports that he has been smoking cigarettes.  He has a 6.00 pack-year smoking history. He has never used smokeless tobacco. He reports that he drinks alcohol. He reports that he does not use drugs.   Family History:   family history includes Diabetes in his father; Hyperlipidemia in his father; Hypertension in his father and mother; Kidney disease in his father; Lung cancer in his father and mother.    Review of Systems: Review of Systems  Constitutional: Positive for malaise/fatigue.  Respiratory: Negative.   Cardiovascular: Negative.   Gastrointestinal: Negative.   Musculoskeletal: Negative.   Skin:       Ecchymosis left side of face  Psychiatric/Behavioral: The patient has insomnia.   All other systems reviewed and are negative.    PHYSICAL EXAM: VS:  BP (!) 188/82 (BP Location: Left Arm, Patient Position: Sitting, Cuff Size: Normal)   Pulse (!) 58   Ht 6' (1.829 m)   Wt 233 lb 12 oz (106 kg)   BMI 31.70 kg/m  , BMI Body mass index is 31.7 kg/m. Constitutional:  oriented to person, place, and time. No distress.  HENT:  Head: Normocephalic and atraumatic.  Eyes:  no discharge. No scleral icterus.  Neck: Normal range of motion. Neck supple. No JVD present.  Cardiac: RRR; 1-2+ SEM RSB murmurs, rubs, or gallops, No edema No murmur heard. Pulmonary/Chest: Effort normal and breath sounds normal. No stridor. No respiratory distress.  no wheezes.  no rales.  no tenderness.  Abdominal: Soft.  no distension.  no tenderness.  Musculoskeletal: Normal range of motion.  no  tenderness or deformity.  Neurological:  normal muscle tone. Coordination normal. No atrophy Skin: Skin is warm and dry. No rash noted. not diaphoretic.   Psychiatric:  normal mood and affect. behavior is normal. Thought content normal.   Recent Labs: 05/09/2017: ALT 81; BUN 20; Creatinine, Ser 0.80; Potassium 4.4; Sodium 138 05/18/2017: Hemoglobin 10.9; Platelets 95    Lipid Panel Lab Results  Component Value Date   CHOL 161 11/20/2015   HDL 46 11/20/2015   LDLCALC 86 11/20/2015   TRIG 143 11/20/2015      Wt Readings from Last 3 Encounters:  07/11/17 233 lb 12 oz (106 kg)  06/12/17 231 lb (104.8 kg)  05/30/17 231 lb (104.8 kg)      ASSESSMENT AND PLAN:   Essential hypertension - Plan: EKG 12-Lead Long discussion concerning his blood pressure Will avoid clonidine given symptoms of fatigue  Previous history of leg edema, will avoid calcium channel blockers Did not tolerate isosorbide in the past Off lisinopril given his reported symptoms of leg swelling Again will recommended he stay on Cardura 1-2 mg twice a day for hypertension Continue on his bystolic He reports blood pressures in the 130 range at home, well above that range on today's visit and on previous visits  Pure hypercholesterolemia - Plan: EKG 12-Lead Tolerating  praluent Goal LDL less than 70  No changes to his medication  Typical atrial flutter ([place]) - Plan: EKG 12-Lead Maintaining normal sinus rhythm  Continue anticoagulation  Coronary artery disease involving native coronary artery of native heart without angina pectoris - Plan: EKG 12-Lead Currently with no symptoms of angina. No further workup at this time. Continue current medication regimen. stable stressed the importance of smoking cessation  Anxiety/fatigue/depression insomnia Previously was on Ambien History of alcohol abuse  Alcohol use Several per day Possibly contributing to tremor, affecting his sleep  Smoker We have encouraged him to continue to work on weaning his cigarettes and smoking cessation. He will continue to work on this and does not want any assistance with chantix.     Total encounter time more than 25 minutes  Greater than 50% was spent in counseling and coordination of care with the patient   Disposition:   F/U  12 months   Orders Placed This Encounter  Procedures  . EKG 12-Lead     Signed, Esmond Plants, M.D., Ph.D. 07/11/2017  [place], [place]

## 2017-07-11 ENCOUNTER — Ambulatory Visit: Payer: 59 | Admitting: Cardiovascular Disease

## 2017-07-11 ENCOUNTER — Encounter: Payer: Self-pay | Admitting: Cardiovascular Disease

## 2017-07-11 VITALS — BP 188/82 | HR 58 | Ht 72.0 in | Wt 233.8 lb

## 2017-07-11 DIAGNOSIS — I700 Atherosclerosis of aorta: Secondary | ICD-10-CM

## 2017-07-11 DIAGNOSIS — E7800 Pure hypercholesterolemia, unspecified: Secondary | ICD-10-CM

## 2017-07-11 DIAGNOSIS — I872 Venous insufficiency (chronic) (peripheral): Secondary | ICD-10-CM

## 2017-07-11 DIAGNOSIS — M25512 Pain in left shoulder: Secondary | ICD-10-CM | POA: Diagnosis not present

## 2017-07-11 DIAGNOSIS — F1011 Alcohol abuse, in remission: Secondary | ICD-10-CM

## 2017-07-11 DIAGNOSIS — I10 Essential (primary) hypertension: Secondary | ICD-10-CM

## 2017-07-11 DIAGNOSIS — M6281 Muscle weakness (generalized): Secondary | ICD-10-CM | POA: Diagnosis not present

## 2017-07-11 DIAGNOSIS — M25612 Stiffness of left shoulder, not elsewhere classified: Secondary | ICD-10-CM | POA: Diagnosis not present

## 2017-07-11 DIAGNOSIS — Z720 Tobacco use: Secondary | ICD-10-CM

## 2017-07-11 DIAGNOSIS — Z87898 Personal history of other specified conditions: Secondary | ICD-10-CM

## 2017-07-11 DIAGNOSIS — I25118 Atherosclerotic heart disease of native coronary artery with other forms of angina pectoris: Secondary | ICD-10-CM | POA: Diagnosis not present

## 2017-07-11 DIAGNOSIS — I483 Typical atrial flutter: Secondary | ICD-10-CM | POA: Diagnosis not present

## 2017-07-11 MED ORDER — DOXAZOSIN MESYLATE 2 MG PO TABS: 2.0000 mg | ORAL_TABLET | Freq: Every day | ORAL | 6 refills | Status: DC

## 2017-07-11 NOTE — Patient Instructions (Addendum)
Medication Instructions:   Take extra cardura 2 mg pill for blood pressure >160  Labwork:  No new labs needed  Testing/Procedures:  No further testing at this time   Follow-Up: It was a pleasure seeing you in the office today. Please call us if you have new issues that need to be addressed before your next appt.  [phone]  Your physician wants you to follow-up in: 12 months.  You will receive a reminder letter in the mail two months in advance. If you don't receive a letter, please call our office to schedule the follow-up appointment.  If you need a refill on your cardiac medications before your next appointment, please call your pharmacy.  For educational health videos Log in to : [email or web address] Or : [email or web address], password : [email or web address]

## 2017-07-14 ENCOUNTER — Ambulatory Visit (INDEPENDENT_AMBULATORY_CARE_PROVIDER_SITE_OTHER): Payer: 59 | Admitting: Orthopaedic Surgery

## 2017-07-14 ENCOUNTER — Encounter (INDEPENDENT_AMBULATORY_CARE_PROVIDER_SITE_OTHER): Payer: Self-pay | Admitting: Orthopaedic Surgery

## 2017-07-14 VITALS — BP 179/71 | HR 58 | Resp 16 | Ht 72.0 in | Wt 231.0 lb

## 2017-07-14 DIAGNOSIS — M25512 Pain in left shoulder: Secondary | ICD-10-CM

## 2017-07-14 DIAGNOSIS — G8929 Other chronic pain: Secondary | ICD-10-CM

## 2017-07-14 MED ORDER — OXYCODONE HCL 5 MG PO TABS: 5.0000 mg | ORAL_TABLET | ORAL | 0 refills | Status: DC | PRN

## 2017-07-14 NOTE — Progress Notes (Signed)
Office Visit Note   Patient: Peter [name]           Date of Birth: [birth year]           MRN: [id number] Visit Date: 07/14/2017              Requested by: [name], MD [address] [place], [place] [address] PCP: [name], MD   Assessment & Plan: Visit Diagnoses:  1. Chronic left shoulder pain     Plan: 5 weeks status post left shoulder arthroscopic debridement and with an SCD, DCR and debridement of a labral tear.  Quite well.  Continues in physical therapy.  We will plan to see back in 1 month.  Renew oxycodone  Follow-Up Instructions: Return in about 1 month (around 08/14/2017).   Orders:  No orders of the defined types were placed in this encounter.  Meds ordered this encounter  Medications  . oxyCODONE (OXY IR/ROXICODONE) 5 MG immediate release tablet    Sig: Take 1 tablet (5 mg total) by mouth as needed.    Dispense:  30 tablet    Refill:  0      Procedures: No procedures performed   Clinical Data: No additional findings.   Subjective: Chief Complaint  Patient presents with  . Left Shoulder - Follow-up  . Shoulder Pain    Pain w/PT   5 weeks status post arthroscopic subacromial decompression, distal clavicle resection debridement of glenoid labrum.  Has considerably less pain than he did before surgery.  Continues to be followed in physical therapy with excellent range of motion.  No related fever chills, shortness of breath or chest pain  HPI  Review of Systems   Objective: Vital Signs: BP (!) 179/71 (BP Location: Left Arm, Patient Position: Sitting, Cuff Size: Normal)   Pulse (!) 58   Resp 16   Ht 6' (1.829 m)   Wt 231 lb (104.8 kg)   BMI 31.33 kg/m   Physical Exam  Ortho Exam awake alert and oriented x3.  Comfortable sitting.  Full overhead motion and abduction left shoulder.  Arthroscopic portals healed nicely.  Neurovascular exam intact.  Some mild tenderness over the distal clavicle  Specialty Comments:    No specialty comments available.  Imaging: No results found.   PMFS History: Patient Active Problem List   Diagnosis Date Noted  . Chronic left shoulder pain 06/12/2017  . Allergic rhinitis 04/24/2017  . Thrombocytopenia ([place]) 01/23/2017  . Polypharmacy 01/20/2017  . Syncope 01/19/2017  . Elevated troponin 01/19/2017  . Sinusitis 06/08/2016  . Tremor 03/23/2016  . Paresthesia 03/23/2016  . Anxiety 02/23/2016  . Typical atrial flutter ([place]) 09/21/2015  . CAD (coronary artery disease) 05/06/2015  . Aortic atherosclerosis ([place]) 03/20/2015  . History of alcohol abuse 02/04/2015  . Alcohol induced fatty liver 02/04/2015  . Obesity (BMI 30.0-34.9) 02/04/2015  . Back pain 02/04/2015  . History of prostate cancer 02/04/2015  . Chronic venous insufficiency 10/12/2012  . Lesion of ulnar nerve 09/03/2012  . Tobacco abuse 04/05/2011  . Hyperlipidemia 01/21/2010  . Essential hypertension 12/16/2009  . Premature beats 12/16/2009   Past Medical History:  Diagnosis Date  . A-fib ([place])   . Abnormality of gait 09/03/2012  . Alcohol abuse   . Anxiety   . Arthritis   . Arthritis   . Esophageal stricture   . GERD (gastroesophageal reflux disease)   . Gout   . Hyperlipidemia   . Hypertension   . Internal  hemorrhoids   . Lesion of ulnar nerve 09/03/2012   Right ulnar neuropathy  . Murmur, cardiac   . PAF (paroxysmal atrial fibrillation) ([place])   . Palpitations   . Prostate cancer ([place]) 2016   treated with radioactive seed implant  . Seizures ([place])    pt states he had seizure 12/14  . Tubular adenoma of colon 03/2008    Family History  Problem Relation Age of Onset  . Diabetes Father   . Kidney disease Father   . Lung cancer Father        smoker  . Hyperlipidemia Father   . Hypertension Father   . Lung cancer Mother        smoke  . Hypertension Mother   . Colon cancer Neg Hx   . Pancreatic cancer Neg Hx   . Stomach cancer Neg Hx     Past Surgical History:  Procedure  Laterality Date  . CATARACT EXTRACTION W/PHACO Right 07/03/2014   Procedure: CATARACT EXTRACTION PHACO AND INTRAOCULAR LENS PLACEMENT (IOC);  Surgeon: Lyla Glassing, MD;  Location: [facility] OR[facility];  Service: Ophthalmology;  Laterality: Right;  lot # [id number] H [place]: 00:32.3 AP% 9.1 CDE: 2.92  . CIRCUMCISION    . COLONOSCOPY    . ELBOW SURGERY    . ELECTROPHYSIOLOGIC STUDY N/A 11/02/2015   Procedure: CARDIOVERSION;  Surgeon: Minna Merritts, MD;  Location: [facility] OR[facility];  Service: Cardiovascular;  Laterality: N/A;  . ESOPHAGOGASTRODUODENOSCOPY (EGD) WITH PROPOFOL N/A 05/17/2016   Procedure: ESOPHAGOGASTRODUODENOSCOPY (EGD) WITH PROPOFOL;  Surgeon: Lucilla Lame, MD;  Location: [facility] END[facility];  Service: Endoscopy;  Laterality: N/A;  . EYE SURGERY    . HAND SURGERY Left   . SHOULDER ARTHROSCOPY Left   . SHOULDER SURGERY     Social History   Occupational History  . Occupation: Retired    Fish farm manager: Economist  . Occupation: UTILITIES OPER.    Employer: [employer]  Tobacco Use  . Smoking status: Current Every Day Smoker    Packs/day: 0.10    Years: 24.00    Pack years: 2.40    Types: Cigarettes  . Smokeless tobacco: Never Used  . Tobacco comment: down to 4-5  ciggs a day.  Substance and Sexual Activity  . Alcohol use: Yes    Alcohol/week: 1.2 oz    Types: 2 Cans of beer per week    Frequency: Never  . Drug use: No    Types: Marijuana    Comment: 1970's occ.   Marland Kitchen Sexual activity: Not on file     Garald Balding, MD   Note - This record has been created using [employer].  Chart creation errors have been sought, but may not always  have been located. Such creation errors do not reflect on  the standard of medical care.

## 2017-07-17 ENCOUNTER — Other Ambulatory Visit: Payer: Self-pay | Admitting: Family Medicine

## 2017-07-18 ENCOUNTER — Telehealth: Payer: Self-pay

## 2017-07-18 DIAGNOSIS — M25512 Pain in left shoulder: Secondary | ICD-10-CM | POA: Diagnosis not present

## 2017-07-18 DIAGNOSIS — M25612 Stiffness of left shoulder, not elsewhere classified: Secondary | ICD-10-CM | POA: Diagnosis not present

## 2017-07-18 DIAGNOSIS — M6281 Muscle weakness (generalized): Secondary | ICD-10-CM | POA: Diagnosis not present

## 2017-07-18 NOTE — Telephone Encounter (Signed)
It appears the patient has been getting oxycodone from his surgeon.  Please find out if he is continued to require the tramadol.  Thanks.

## 2017-07-18 NOTE — Telephone Encounter (Signed)
Copied from [place] [id number]. Topic: Inquiry >> Jul 18, 2017 12:58 PM Pricilla Handler wrote: Reason for CRM: Patient called requesting a refill of TraMADol (ULTRAM) 50 MG tablet. Please call patient at [phone].        Thank You!!!

## 2017-07-18 NOTE — Telephone Encounter (Signed)
Last OV 04/24/17 last filled 06/13/17 30 0rf

## 2017-07-19 NOTE — Telephone Encounter (Signed)
fyi

## 2017-07-19 NOTE — Telephone Encounter (Signed)
Patient notified and verbalized understanding. 

## 2017-07-19 NOTE — Telephone Encounter (Signed)
Noted.  He should not take the tramadol when he takes the oxycodone.  I have sent a refill to his pharmacy.  Drug database reviewed.

## 2017-07-19 NOTE — Telephone Encounter (Signed)
Pt states that he takes the tramadol for his back but takes the Oxycodone for his lt shoulder when he had surgery 4 weeks ago he only takes that after his physical therapy appts

## 2017-07-20 ENCOUNTER — Ambulatory Visit: Payer: 59 | Admitting: Neurology

## 2017-07-20 ENCOUNTER — Encounter: Payer: Self-pay | Admitting: Oncology

## 2017-07-21 ENCOUNTER — Telehealth: Payer: Self-pay | Admitting: Cardiovascular Disease

## 2017-07-21 NOTE — Telephone Encounter (Signed)
Patient states that he got up this morning and blood pressure was elevated.  180/84 pulse 58 and took bystolic 183/43 Took a 1/4 pill of cardura  78/46 77/40 90/46  103/50 He has been having some dizziness as well. Reviewed medications and discussed time frames for taking them. Also reviewed his pain associated with surgery that he had and how that can cause more elevated BP numbers. Instructed him to continue monitoring his blood pressures and if they are consistently having episodes of high and low numbers then to let us know because we may need to change his medications. He verbalized understanding of our conversation, agreement with plan, and had no further questions.

## 2017-07-21 NOTE — Telephone Encounter (Signed)
Left voicemail message to call back  

## 2017-07-21 NOTE — Telephone Encounter (Signed)
Patient returning call.

## 2017-07-21 NOTE — Telephone Encounter (Signed)
Please call regarding his BP medication States his BP this a.m. 180/84 HR 58, he then took a Bystolic and dropped to 592/76, and then took Cardura (1/4 pill) and his BP dropped to 87/49. States he is not having any symptoms. States he is somewhat dizzy. States he does not want to have to "play with his medication like this". Please call to discuss.

## 2017-07-24 DIAGNOSIS — M25612 Stiffness of left shoulder, not elsewhere classified: Secondary | ICD-10-CM | POA: Diagnosis not present

## 2017-07-24 DIAGNOSIS — M25512 Pain in left shoulder: Secondary | ICD-10-CM | POA: Diagnosis not present

## 2017-07-24 DIAGNOSIS — J301 Allergic rhinitis due to pollen: Secondary | ICD-10-CM | POA: Diagnosis not present

## 2017-07-24 DIAGNOSIS — M6281 Muscle weakness (generalized): Secondary | ICD-10-CM | POA: Diagnosis not present

## 2017-07-26 ENCOUNTER — Encounter: Payer: Self-pay | Admitting: Oncology

## 2017-07-31 DIAGNOSIS — M25612 Stiffness of left shoulder, not elsewhere classified: Secondary | ICD-10-CM | POA: Diagnosis not present

## 2017-07-31 DIAGNOSIS — M25512 Pain in left shoulder: Secondary | ICD-10-CM | POA: Diagnosis not present

## 2017-07-31 DIAGNOSIS — M6281 Muscle weakness (generalized): Secondary | ICD-10-CM | POA: Diagnosis not present

## 2017-08-04 DIAGNOSIS — H6123 Impacted cerumen, bilateral: Secondary | ICD-10-CM | POA: Diagnosis not present

## 2017-08-04 DIAGNOSIS — H6980 Other specified disorders of Eustachian tube, unspecified ear: Secondary | ICD-10-CM | POA: Diagnosis not present

## 2017-08-11 ENCOUNTER — Other Ambulatory Visit: Payer: Self-pay | Admitting: Cardiovascular Disease

## 2017-08-15 DIAGNOSIS — M25612 Stiffness of left shoulder, not elsewhere classified: Secondary | ICD-10-CM | POA: Diagnosis not present

## 2017-08-15 DIAGNOSIS — M6281 Muscle weakness (generalized): Secondary | ICD-10-CM | POA: Diagnosis not present

## 2017-08-15 DIAGNOSIS — M25512 Pain in left shoulder: Secondary | ICD-10-CM | POA: Diagnosis not present

## 2017-08-16 DIAGNOSIS — J301 Allergic rhinitis due to pollen: Secondary | ICD-10-CM | POA: Diagnosis not present

## 2017-08-18 ENCOUNTER — Ambulatory Visit (INDEPENDENT_AMBULATORY_CARE_PROVIDER_SITE_OTHER): Payer: 59 | Admitting: Orthopaedic Surgery

## 2017-08-25 ENCOUNTER — Ambulatory Visit (INDEPENDENT_AMBULATORY_CARE_PROVIDER_SITE_OTHER): Payer: 59 | Admitting: Orthopaedic Surgery

## 2017-08-26 ENCOUNTER — Encounter: Payer: Self-pay | Admitting: Emergency Medicine

## 2017-08-26 ENCOUNTER — Emergency Department: Payer: 59

## 2017-08-26 ENCOUNTER — Emergency Department
Admission: EM | Admit: 2017-08-26 | Discharge: 2017-08-26 | Disposition: A | Discharge status: Home / Self Care | Payer: 59 | Attending: Emergency Medicine | Admitting: Emergency Medicine

## 2017-08-26 ENCOUNTER — Other Ambulatory Visit: Payer: Self-pay

## 2017-08-26 DIAGNOSIS — Z8546 Personal history of malignant neoplasm of prostate: Secondary | ICD-10-CM | POA: Insufficient documentation

## 2017-08-26 DIAGNOSIS — I10 Essential (primary) hypertension: Secondary | ICD-10-CM | POA: Insufficient documentation

## 2017-08-26 DIAGNOSIS — F17210 Nicotine dependence, cigarettes, uncomplicated: Secondary | ICD-10-CM | POA: Insufficient documentation

## 2017-08-26 DIAGNOSIS — Z7901 Long term (current) use of anticoagulants: Secondary | ICD-10-CM | POA: Insufficient documentation

## 2017-08-26 DIAGNOSIS — R079 Chest pain, unspecified: Secondary | ICD-10-CM | POA: Insufficient documentation

## 2017-08-26 DIAGNOSIS — Z79899 Other long term (current) drug therapy: Secondary | ICD-10-CM | POA: Diagnosis not present

## 2017-08-26 DIAGNOSIS — I2510 Atherosclerotic heart disease of native coronary artery without angina pectoris: Secondary | ICD-10-CM | POA: Insufficient documentation

## 2017-08-26 DIAGNOSIS — R0789 Other chest pain: Secondary | ICD-10-CM | POA: Diagnosis not present

## 2017-08-26 LAB — BASIC METABOLIC PANEL
Anion gap: 10 (ref 5–15)
BUN: 12 mg/dL (ref 8–23)
CHLORIDE: 105 mmol/L (ref 98–111)
CO2: 25 mmol/L (ref 22–32)
CREATININE: 0.74 mg/dL (ref 0.61–1.24)
Calcium: 9.3 mg/dL (ref 8.9–10.3)
GFR calc non Af Amer: >60 mL/min (ref >60)
GLUCOSE: 113 mg/dL — AB (ref 70–99)
Potassium: 4.3 mmol/L (ref 3.5–5.1)
Sodium: 140 mmol/L (ref 135–145)

## 2017-08-26 LAB — CBC
HCT: 37.7 % — ABNORMAL LOW (ref 40.0–52.0)
HEMOGLOBIN: 13.4 g/dL (ref 13.0–18.0)
MCH: 38.7 pg — AB (ref 26.0–34.0)
MCHC: 35.5 g/dL (ref 32.0–36.0)
MCV: 109.1 fL — AB (ref 80.0–100.0)
Platelets: 66 10*3/uL — ABNORMAL LOW (ref 150–440)
RBC: 3.46 MIL/uL — AB (ref 4.40–5.90)
RDW: 14.9 % — ABNORMAL HIGH (ref 11.5–14.5)
WBC: 7.4 10*3/uL (ref 3.8–10.6)

## 2017-08-26 LAB — TROPONIN I
Troponin I: 0.04 ng/mL (ref <0.03)
Troponin I: 0.05 ng/mL (ref <0.03)

## 2017-08-26 MED ORDER — GI COCKTAIL ~~LOC~~
30.0000 mL | Freq: Once | ORAL | Status: AC
  Administered 2017-08-26: 30 mL via ORAL
  Filled 2017-08-26: qty 30

## 2017-08-26 NOTE — ED Notes (Signed)
Pt in xray

## 2017-08-26 NOTE — ED Provider Notes (Signed)
[facility] Emergency Department Provider Note ____________________________________________   First MD Initiated Contact with Patient 08/26/17 1501     (approximate)  I have reviewed the triage vital signs and the nursing notes.   HISTORY  Chief Complaint Chest Pain   HPI Peter [name] is a 62 y.o. male with history of atrial for ablation on Eliquis as well as hypertension who is presenting to the emergency department today with chest pressure.  He says it is to the center of his chest over the sternum and without any radiation.  He states that he also has left shoulder aching as well as left upper extremity aching which has been present ever since he had a rotator cuff surgery several months ago.  He denies any numbness or weakness to the left upper extremity.  He denies any shortness of breath.  Says that the chest pain comes for 5-minute intervals and does not radiate to the back or to the neck.  He says that he does have chronic neck pain that is unchanged and consistent with his past musculoskeletal pain.  Denies any nausea.  Says that he took an antacid last night because he thought this related to heartburn but the pain did not subside.  He presented to the emergency department as a precaution to make sure that he was not having any issue with his heart.  Denies any history of heart disease in his family.  However, says that he smokes and drinks daily.  Past Medical History:  Diagnosis Date  . A-fib ([place])   . Abnormality of gait 09/03/2012  . Alcohol abuse   . Anxiety   . Arthritis   . Arthritis   . Esophageal stricture   . GERD (gastroesophageal reflux disease)   . Gout   . Hyperlipidemia   . Hypertension   . Internal hemorrhoids   . Lesion of ulnar nerve 09/03/2012   Right ulnar neuropathy  . Murmur, cardiac   . PAF (paroxysmal atrial fibrillation) ([place])   . Palpitations   . Prostate cancer ([place]) 2016   treated with radioactive seed implant    . Seizures ([place])    pt states he had seizure 12/14  . Tubular adenoma of colon 03/2008    Patient Active Problem List   Diagnosis Date Noted  . Chronic left shoulder pain 06/12/2017  . Allergic rhinitis 04/24/2017  . Thrombocytopenia ([place]) 01/23/2017  . Polypharmacy 01/20/2017  . Syncope 01/19/2017  . Elevated troponin 01/19/2017  . Sinusitis 06/08/2016  . Tremor 03/23/2016  . Paresthesia 03/23/2016  . Anxiety 02/23/2016  . Typical atrial flutter ([place]) 09/21/2015  . CAD (coronary artery disease) 05/06/2015  . Aortic atherosclerosis ([place]) 03/20/2015  . History of alcohol abuse 02/04/2015  . Alcohol induced fatty liver 02/04/2015  . Obesity (BMI 30.0-34.9) 02/04/2015  . Back pain 02/04/2015  . History of prostate cancer 02/04/2015  . Chronic venous insufficiency 10/12/2012  . Lesion of ulnar nerve 09/03/2012  . Tobacco abuse 04/05/2011  . Hyperlipidemia 01/21/2010  . Essential hypertension 12/16/2009  . Premature beats 12/16/2009    Past Surgical History:  Procedure Laterality Date  . CATARACT EXTRACTION W/PHACO Right 07/03/2014   Procedure: CATARACT EXTRACTION PHACO AND INTRAOCULAR LENS PLACEMENT (IOC);  Surgeon: Lyla Glassing, MD;  Location: [facility] ORS;  Service: Ophthalmology;  Laterality: Right;  lot # [id number] H [place]: 00:32.3 AP% 9.1 CDE: 2.92  . CIRCUMCISION    . COLONOSCOPY    . ELBOW SURGERY    . ELECTROPHYSIOLOGIC STUDY  N/A 11/02/2015   Procedure: CARDIOVERSION;  Surgeon: Minna Merritts, MD;  Location: [facility] ORS;  Service: Cardiovascular;  Laterality: N/A;  . ESOPHAGOGASTRODUODENOSCOPY (EGD) WITH PROPOFOL N/A 05/17/2016   Procedure: ESOPHAGOGASTRODUODENOSCOPY (EGD) WITH PROPOFOL;  Surgeon: Lucilla Lame, MD;  Location: [facility] ENDOSCOPY;  Service: Endoscopy;  Laterality: N/A;  . EYE SURGERY    . HAND SURGERY Left   . SHOULDER ARTHROSCOPY Left   . SHOULDER SURGERY      Prior to Admission medications   Medication Sig Start Date End Date Taking? Authorizing Provider   apixaban (ELIQUIS) 5 MG TABS tablet Take 5 mg by mouth 2 (two) times daily.    [provider]  B Complex-C (B-COMPLEX WITH VITAMIN C) tablet Take 1 tablet by mouth daily.    [provider]  BYSTOLIC 20 MG TABS TAKE 1 TABLET (20 MG TOTAL) BY MOUTH DAILY. 08/11/17   Minna Merritts, MD  doxazosin (CARDURA) 2 MG tablet Take 1 tablet (2 mg total) by mouth daily. 07/11/17   Minna Merritts, MD  FOLIC ACID PO Take by mouth daily.    [provider]  Melatonin 10 MG TABS Take 1 tablet by mouth at bedtime.    [provider]  milk thistle 175 MG tablet Take 175 mg by mouth daily.    [provider]  Multiple Vitamin (MULTIVITAMIN) tablet Take 1 tablet by mouth daily. Centrum Plus -Take 1 daily    [provider]  oxyCODONE (OXY IR/ROXICODONE) 5 MG immediate release tablet Take 1 tablet (5 mg total) by mouth as needed. 07/14/17   Garald Balding, MD  PRALUENT 150 MG/ML SOPN  07/12/17   [provider]  tamsulosin (FLOMAX) 0.4 MG CAPS capsule Take 0.4 mg by mouth daily.     [provider]  traMADol (ULTRAM) 50 MG tablet TAKE ONE TABLET BY MOUTH EVERY 12 HOURS AS NEEDED FOR MODERATE PAIN 07/19/17   [name], MD  triamcinolone cream (KENALOG) 0.1 % Apply 1 application topically daily as needed.  12/24/14   [provider]  VOLTAREN 1 % GEL Apply 2 g topically as needed (knees).  04/02/13   [provider]    Allergies Clonazepam; Crestor [rosuvastatin]; Dust mite extract; Hydrocodone bitartrate; Lipitor [atorvastatin]; Lisinopril; and Topamax [topiramate]  Family History  Problem Relation Age of Onset  . Diabetes Father   . Kidney disease Father   . Lung cancer Father        smoker  . Hyperlipidemia Father   . Hypertension Father   . Lung cancer Mother        smoke  . Hypertension Mother   . Colon cancer Neg Hx   . Pancreatic cancer Neg Hx   . Stomach cancer Neg Hx     Social History Social  History   Tobacco Use  . Smoking status: Current Every Day Smoker    Packs/day: 0.10    Years: 24.00    Pack years: 2.40    Types: Cigarettes  . Smokeless tobacco: Never Used  . Tobacco comment: down to 4-5  ciggs a day.  Substance Use Topics  . Alcohol use: Yes    Alcohol/week: 2.0 standard drinks    Types: 2 Cans of beer per week    Frequency: Never  . Drug use: No    Types: Marijuana    Comment: 1970's occ.     Review of Systems  Constitutional: No fever/chills Eyes: No visual changes. ENT: No sore throat. Cardiovascular:  As above. Respiratory: Denies shortness of breath. Gastrointestinal: No abdominal pain.  No nausea, no vomiting.  No diarrhea.  No constipation. Genitourinary: Negative for dysuria. Musculoskeletal: As above Skin: Negative for rash. Neurological: Negative for headaches, focal weakness or numbness.   ____________________________________________   PHYSICAL EXAM:  VITAL SIGNS: ED Triage Vitals  Enc Vitals Group     BP 08/26/17 1416 (!) 186/68     Pulse Rate 08/26/17 1416 (!) 56     Resp 08/26/17 1416 18     Temp 08/26/17 1416 98.3 F (36.8 C)     Temp Source 08/26/17 1416 Oral     SpO2 08/26/17 1416 95 %     Weight 08/26/17 1417 231 lb 7.7 oz (105 kg)     Height 08/26/17 1417 6' (1.829 m)     Head Circumference --      Peak Flow --      Pain Score 08/26/17 1417 1     Pain Loc --      Pain Edu? --      Excl. in [place]? --     Constitutional: Alert and oriented. Well appearing and in no acute distress. Eyes: Conjunctivae are normal.  Head: Atraumatic. Nose: No congestion/rhinnorhea. Mouth/Throat: Mucous membranes are moist.  Neck: No stridor.   Cardiovascular: Normal rate, regular rhythm. Grossly normal heart sounds.  Good peripheral circulation with equal and bilateral radial pulses.  Chest pain is not reproducible to palpation. Respiratory: Normal respiratory effort.  No retractions. Lungs CTAB. Gastrointestinal: Soft and nontender. No  distention. No CVA tenderness. Musculoskeletal: No lower extremity tenderness nor edema.  No joint effusions. Neurologic:  Normal speech and language. No gross focal neurologic deficits are appreciated. Skin:  Skin is warm, dry and intact. No rash noted. Psychiatric: Mood and affect are normal. Speech and behavior are normal.  ____________________________________________   LABS (all labs ordered are listed, but only abnormal results are displayed)  Labs Reviewed  BASIC METABOLIC PANEL - Abnormal; Notable for the following components:      Result Value   Glucose, Bld 113 (*)    All other components within normal limits  CBC - Abnormal; Notable for the following components:   RBC 3.46 (*)    HCT 37.7 (*)    MCV 109.1 (*)    MCH 38.7 (*)    RDW 14.9 (*)    Platelets 66 (*)    All other components within normal limits  TROPONIN I - Abnormal; Notable for the following components:   Troponin I 0.04 (*)    All other components within normal limits  TROPONIN I - Abnormal; Notable for the following components:   Troponin I 0.05 (*)    All other components within normal limits   ____________________________________________  EKG  ED ECG REPORT I, Doran Stabler, the attending physician, personally viewed and interpreted this ECG.   Date: 08/26/2017  EKG Time: 1410  Rate: 58  Rhythm: sinus bradycardia  Axis: Normal  Intervals:left anterior fascicular block  ST&T Change: No ST segment elevation or depression.  No abnormal T wave inversion.  No significant change from previous.  ____________________________________________  RADIOLOGY  Acute finding on the chest x-ray. ____________________________________________   PROCEDURES  Procedure(s) performed:   Procedures  Critical Care performed:   ____________________________________________   INITIAL IMPRESSION / ASSESSMENT AND PLAN / ED COURSE  Pertinent labs & imaging results that were available during my care of  the patient were reviewed by me and considered in my medical decision  making (see chart for details).  Differential diagnosis includes, but is not limited to, ACS, aortic dissection, pulmonary embolism, cardiac tamponade, pneumothorax, pneumonia, pericarditis, myocarditis, GI-related causes including esophagitis/gastritis, and musculoskeletal chest wall pain.   As part of my medical decision making, I reviewed the following data within the electronic MEDICAL RECORD NUMBER Notes from prior ED visits  ----------------------------------------- 5:47 PM on 08/26/2017 -----------------------------------------  Patient received a GI cocktail and is now completely pain-free.  He says that his pain seem to be starting up as he was being interviewed by me previously.  He says that the pain at most was just a 1-2 over the past 24 hours.  However, is complete the gone down continues to be completely gone.  Has baseline elevated troponin and appears to be at his baseline level x2 in the emergency department.  He will follow Dr. Rockey Situ as well as his gastro neurologist, Dr. Verl Blalock.  He says that he has been discussing having an esophageal dilatation with Dr. [name].  Chest pain appears atypical for ACS and relieved by GI cocktail with very reassuring baseline troponins as well as EKG.  I believe the patient is safe for discharge to home.  He is understanding of the treatment plan as well as diagnosis and willing to comply. ____________________________________________   FINAL CLINICAL IMPRESSION(S) / ED DIAGNOSES  Chest pain.   NEW MEDICATIONS STARTED DURING THIS VISIT:  New Prescriptions   No medications on file     Note:  This document was prepared using Dragon voice recognition software and may include unintentional dictation errors.     Orbie Pyo, MD 08/26/17 [id number]

## 2017-08-26 NOTE — ED Notes (Signed)
Date and time results received: 08/26/17 1523  Test: Troponin Critical Value: 0.04  Name of Provider Notified: [place]  Orders Received? Or Actions Taken?: no further orders given at this time, will continue to monitor

## 2017-08-26 NOTE — ED Triage Notes (Signed)
Pt c/o chest tightness with L shoulder numbness x several days. Pt states is currently doing PT for L shoulder. Pt states hx of A-fib and is currently taking Eliquis. Pt states he currently smokes and sometimes drinks. Pt is alert and oriented on assessment.

## 2017-08-28 ENCOUNTER — Telehealth: Payer: Self-pay | Admitting: Cardiovascular Disease

## 2017-08-28 NOTE — Telephone Encounter (Signed)
Peter [name] has an opening today at 1100? Or there are some 7 day holds next Monday 09/04/17. Can you check with patient and see if any of those dates work?

## 2017-08-28 NOTE — Telephone Encounter (Signed)
Patient was seen in ED over the weekend Patient scheduled a follow up appointment to see Dr. Rockey Situ on 9/17 but would like to know if he should be seen sooner Please call to discuss

## 2017-08-29 ENCOUNTER — Telehealth: Payer: Self-pay | Admitting: Neurology

## 2017-08-29 ENCOUNTER — Ambulatory Visit (INDEPENDENT_AMBULATORY_CARE_PROVIDER_SITE_OTHER): Payer: 59 | Admitting: Neurology

## 2017-08-29 ENCOUNTER — Encounter: Payer: Self-pay | Admitting: Neurology

## 2017-08-29 VITALS — BP 161/78 | HR 57 | Ht 72.0 in | Wt 234.5 lb

## 2017-08-29 DIAGNOSIS — M545 Low back pain: Secondary | ICD-10-CM

## 2017-08-29 DIAGNOSIS — G8929 Other chronic pain: Secondary | ICD-10-CM

## 2017-08-29 DIAGNOSIS — R251 Tremor, unspecified: Secondary | ICD-10-CM | POA: Diagnosis not present

## 2017-08-29 MED ORDER — BUSPIRONE HCL 10 MG PO TABS: 10.0000 mg | ORAL_TABLET | Freq: Three times a day (TID) | ORAL | 3 refills | Status: DC

## 2017-08-29 NOTE — Telephone Encounter (Signed)
Pt. States he will call to schedule 6 mo f/u may see NP per Dr. Jannifer Franklin

## 2017-08-29 NOTE — Patient Instructions (Signed)
We will start Buspar 10 mg tablet, take 1/2 tablet three times a day for 2 weeks, then take 1 tablet three times a day.

## 2017-08-29 NOTE — Telephone Encounter (Signed)
Perfect.Noted.

## 2017-08-29 NOTE — Telephone Encounter (Signed)
Patient is now scheduled for 09/01/17 with Dr Rockey Situ

## 2017-08-29 NOTE — Progress Notes (Signed)
Reason for visit: Tremor, low back pain  Peter [name] is an 62 y.o. male  History of present illness:  Peter [name] is a 62 year old right-handed white male with a history of chronic low back pain and a chronic tremor affecting both upper extremities.  The tremor is an action type tremor affecting both arms.  The patient had been on gabapentin for the back and for the tremor, he was having some myoclonic jerks of the legs at nighttime, he went off the gabapentin and this improved.  The patient has recently had surgery on the left shoulder, he is recovering from this.  He still has quite a bit of pain in the shoulder.  He takes Ultram for the back pain.  He recent was in the emergency room on 26 August 2017 with some chest pressure, he was felt to have an esophageal source of this, he has a history of an esophageal stricture.  The patient returns to the office today for an evaluation.  Past Medical History:  Diagnosis Date  . A-fib ([place])   . Abnormality of gait 09/03/2012  . Alcohol abuse   . Anxiety   . Arthritis   . Arthritis   . Esophageal stricture   . GERD (gastroesophageal reflux disease)   . Gout   . Hyperlipidemia   . Hypertension   . Internal hemorrhoids   . Lesion of ulnar nerve 09/03/2012   Right ulnar neuropathy  . Murmur, cardiac   . PAF (paroxysmal atrial fibrillation) ([place])   . Palpitations   . Prostate cancer ([place]) 2016   treated with radioactive seed implant  . Seizures ([place])    pt states he had seizure 12/14  . Tubular adenoma of colon 03/2008    Past Surgical History:  Procedure Laterality Date  . CATARACT EXTRACTION W/PHACO Right 07/03/2014   Procedure: CATARACT EXTRACTION PHACO AND INTRAOCULAR LENS PLACEMENT (IOC);  Surgeon: Lyla Glassing, MD;  Location: [facility] ORS;  Service: Ophthalmology;  Laterality: Right;  lot # [id number] H [place]: 00:32.3 AP% 9.1 CDE: 2.92  . CIRCUMCISION    . COLONOSCOPY    . ELBOW SURGERY    . ELECTROPHYSIOLOGIC STUDY N/A  11/02/2015   Procedure: CARDIOVERSION;  Surgeon: Minna Merritts, MD;  Location: [facility] ORS;  Service: Cardiovascular;  Laterality: N/A;  . ESOPHAGOGASTRODUODENOSCOPY (EGD) WITH PROPOFOL N/A 05/17/2016   Procedure: ESOPHAGOGASTRODUODENOSCOPY (EGD) WITH PROPOFOL;  Surgeon: Lucilla Lame, MD;  Location: [facility] ENDOSCOPY;  Service: Endoscopy;  Laterality: N/A;  . EYE SURGERY    . HAND SURGERY Left   . SHOULDER ARTHROSCOPY Left   . SHOULDER SURGERY      Family History  Problem Relation Age of Onset  . Diabetes Father   . Kidney disease Father   . Lung cancer Father        smoker  . Hyperlipidemia Father   . Hypertension Father   . Lung cancer Mother        smoke  . Hypertension Mother   . Colon cancer Neg Hx   . Pancreatic cancer Neg Hx   . Stomach cancer Neg Hx     Social history:  reports that he has been smoking cigarettes. He has a 2.40 pack-year smoking history. He has never used smokeless tobacco. He reports that he drinks about 2.0 standard drinks of alcohol per week. He reports that he does not use drugs.    Allergies  Allergen Reactions  . Clonazepam     Syncope  . Crestor [Rosuvastatin] Other (  See Comments)    Bloating, swelling of legs, fatty liver  . Dust Mite Extract   . Gabapentin     Leg twitching at night, ineffective   . Hydrocodone Bitartrate   . Lipitor [Atorvastatin] Other (See Comments)    Myalgias  . Lisinopril Swelling  . Topamax [Topiramate]     Cognitive clouding    Medications:  Prior to Admission medications   Medication Sig Start Date End Date Taking? Authorizing Provider  apixaban (ELIQUIS) 5 MG TABS tablet Take 5 mg by mouth 2 (two) times daily.   Yes [provider]  B Complex-C (B-COMPLEX WITH VITAMIN C) tablet Take 1 tablet by mouth daily.   Yes [provider]  BYSTOLIC 20 MG TABS TAKE 1 TABLET (20 MG TOTAL) BY MOUTH DAILY. 08/11/17  Yes Minna Merritts, MD  doxazosin (CARDURA) 2 MG tablet Take 1 tablet (2 mg total) by  mouth daily. 07/11/17  Yes Minna Merritts, MD  FOLIC ACID PO Take by mouth daily.   Yes [provider]  Melatonin 10 MG TABS Take 1 tablet by mouth at bedtime.   Yes [provider]  milk thistle 175 MG tablet Take 175 mg by mouth daily.   Yes [provider]  Multiple Vitamin (MULTIVITAMIN) tablet Take 1 tablet by mouth daily. Centrum Plus -Take 1 daily   Yes [provider]  oxyCODONE (OXY IR/ROXICODONE) 5 MG immediate release tablet Take 1 tablet (5 mg total) by mouth as needed. 07/14/17  Yes Garald Balding, MD  PRALUENT 150 MG/ML SOPN  07/12/17  Yes [provider]  tamsulosin (FLOMAX) 0.4 MG CAPS capsule Take 0.4 mg by mouth daily.    Yes [provider]  traMADol (ULTRAM) 50 MG tablet TAKE ONE TABLET BY MOUTH EVERY 12 HOURS AS NEEDED FOR MODERATE PAIN 07/19/17  Yes [name], MD  triamcinolone cream (KENALOG) 0.1 % Apply 1 application topically daily as needed.  12/24/14  Yes [provider]  VOLTAREN 1 % GEL Apply 2 g topically as needed (knees).  04/02/13  Yes [provider]  busPIRone (BUSPAR) 10 MG tablet Take 1 tablet (10 mg total) by mouth 3 (three) times daily. 08/29/17   Kathrynn Ducking, MD    ROS:  Out of a complete 14 system review of symptoms, the patient complains only of the following symptoms, and all other reviewed systems are negative.  Decreased activity Eye redness Chest tightness Leg swelling, heart murmur Restless legs, snoring, sleep talking Frequency of urination Joint pain, back pain Bruising easily Tremors Depression, anxiety  Blood pressure (!) 161/78, pulse (!) 57, height 6' (1.829 m), weight 234 lb 8 oz (106.4 kg), SpO2 98 %.  Physical Exam  General: The patient is alert and cooperative at the time of the examination.  The patient is moderately obese.  Skin: No significant peripheral edema is noted.   Neurologic Exam  Mental status: The patient is alert and  oriented x 3 at the time of the examination. The patient has apparent normal recent and remote memory, with an apparently normal attention span and concentration ability.   Cranial nerves: Facial symmetry is present. Speech is normal, no aphasia or dysarthria is noted. Extraocular movements are full. Visual fields are full.  Motor: The patient has good strength in all 4 extremities.  Sensory examination: Soft touch sensation is symmetric on the face, arms, and legs.  Coordination: The patient has good finger-nose-finger and heel-to-shin bilaterally.  The patient has  a fine intention type tremor with finger-nose-finger bilaterally.  Gait and station: The patient has a normal gait. Tandem gait is unsteady. Romberg is negative. No drift is seen.  Reflexes: Deep tendon reflexes are symmetric.   Assessment/Plan:  1.  Chronic low back pain  2.  Tremor  The patient indicates that in the past he has been on BuSpar which seemed to help his anxiety issue and has reduce the tremor, we will start low-dose BuSpar taking 5 mg 3 times a day for 2 weeks and then go to 10 mg 3 times daily.  The patient will follow-up in 6 months, he will call for any dose adjustments.   Jill Alexanders MD 08/29/2017 12:26 PM  [facility] [address] [place], [place] [address]  Phone [phone] Fax [phone]

## 2017-08-31 ENCOUNTER — Encounter (INDEPENDENT_AMBULATORY_CARE_PROVIDER_SITE_OTHER): Payer: Self-pay | Admitting: Orthopaedic Surgery

## 2017-08-31 ENCOUNTER — Ambulatory Visit (INDEPENDENT_AMBULATORY_CARE_PROVIDER_SITE_OTHER): Payer: 59 | Admitting: Orthopaedic Surgery

## 2017-08-31 VITALS — BP 174/69 | HR 59

## 2017-08-31 DIAGNOSIS — G8929 Other chronic pain: Secondary | ICD-10-CM

## 2017-08-31 DIAGNOSIS — J301 Allergic rhinitis due to pollen: Secondary | ICD-10-CM | POA: Diagnosis not present

## 2017-08-31 DIAGNOSIS — M25512 Pain in left shoulder: Secondary | ICD-10-CM

## 2017-08-31 MED ORDER — DICLOFENAC SODIUM 1 % TD GEL: 2.0000 g | Freq: Four times a day (QID) | TRANSDERMAL | 2 refills | Status: DC

## 2017-08-31 NOTE — Progress Notes (Deleted)
Cardiology Office Note  Date:  08/31/2017   ID:  Peter [name], DOB 1955-03-20, MRN [id number]  PCP:  [name], MD   No chief complaint on file.   HPI:  Peter [name] is a 62 year old gentleman with  long history of smoking, 4 cigs a day hypertension,  daily alcohol drinking,  prostate cancer ,  statin intolerance ,    underlying liver dysfunction, elevated total bilirubin CAD, CT coronary calcium score 2778 in 04/2015 Stress test 07/2014 no ischemia Cardioversion 11/02/2015 For atrial flutter tolerating praluent , dramatic improvement in his cholesterol down to 160 from 260 who presents for follow-up of his coronary artery disease, hypertension, hyperlipidemia  In follow-up he reports that he has been doing relatively well Reports he was unable to tolerate lisinopril, "caused leg swelling", stopped the medication He is taking bystolic in the morning Also taking Cardura 2 mg in the evening (1/4 of an 8 mg pill)  BP elevated today, 180 even on my recheck Reports his blood pressures typically Typically 542 systolic t home  Weight down 15 pounds in 6 months Eating less  Denies any further falls previous episode of fall, possible loss of consciousness Was drinking alcohol at home, took clonazepam (this was provided for tremor) Passed out, hit his face on the left Was in the hospital, blood thinners held overnight 01/19/2017  anticoagulation held until platelets improved  hemoglobin of 9 down from  13    No regular exercise program Denies any chest pain concerning for angina  EKG personally reviewed by myself on todays visit Shows normal sinus rhythm rate 58 bpm no significant ST or T-wave changes  other past medical history reviewed Echo January 2019  EF normal >65%  Previous EGD with Dr. [name],  Essentially normal procedure, no sign of bleeding  echocardiogram 02/2016 showing normal LV function  Chronic back pain He went to the ER for back pain  11/22/2015 Started On tramadol PRN  Limited by back pain , was told he could get a "shot" "shaking" for 2 years since prostate seeds, stopped librium Still with heavy ETOH, previous notes indicating 4-5 beers per day Frequent nocturia Has been taking lisinopril 40 mg x 2, presumably because blood pressure was running high, now running out of his medication. Did not call our office  Previous stress test July 2016 showing no ischemia CT scan of the chest, CT coronary calcium score discussed with him in detail, images pulled up in the office. His scores close to 3000. He denies any anginal symptoms. Images show diffuse heavy calcification/calcified atherosclerosis in the LAD, diagonal branch, ostial and proximal RCA. No significant disease noted in the left circumflex.   he reports being debilitated when he was on Crestor, Lipitor He had diffuse muscle pain in his legs, has tried different doses, does not want to retry a statin given the debilitating side effects. Feels it made it impossible to get around, on top of his arthritis, severe knee pain, it was just too much  CT scan of the abdomen shows scattered diffuse mild aortic atherosclerosis.  Lab work reviewed with him showing hemoglobin A1c 5.5 , total cholesterol more than 230   PMH:   has a past medical history of A-fib ([place]), Abnormality of gait (09/03/2012), Alcohol abuse, Anxiety, Arthritis, Arthritis, Esophageal stricture, GERD (gastroesophageal reflux disease), Gout, Hyperlipidemia, Hypertension, Internal hemorrhoids, Lesion of ulnar nerve (09/03/2012), Murmur, cardiac, PAF (paroxysmal atrial fibrillation) ([place]), Palpitations, Prostate cancer ([place] [name]) (2016), Seizures ([place]), and Tubular adenoma of colon (  03/2008).  PSH:    Past Surgical History:  Procedure Laterality Date  . CATARACT EXTRACTION W/PHACO Right 07/03/2014   Procedure: CATARACT EXTRACTION PHACO AND INTRAOCULAR LENS PLACEMENT (IOC);  Surgeon: Lyla Glassing, MD;   Location: [facility] ORS;  Service: Ophthalmology;  Laterality: Right;  lot # [id number] H [place]: 00:32.3 AP% 9.1 CDE: 2.92  . CIRCUMCISION    . COLONOSCOPY    . ELBOW SURGERY    . ELECTROPHYSIOLOGIC STUDY N/A 11/02/2015   Procedure: CARDIOVERSION;  Surgeon: Minna Merritts, MD;  Location: [facility] ORS;  Service: Cardiovascular;  Laterality: N/A;  . ESOPHAGOGASTRODUODENOSCOPY (EGD) WITH PROPOFOL N/A 05/17/2016   Procedure: ESOPHAGOGASTRODUODENOSCOPY (EGD) WITH PROPOFOL;  Surgeon: Lucilla Lame, MD;  Location: [facility] ENDOSCOPY;  Service: Endoscopy;  Laterality: N/A;  . EYE SURGERY    . HAND SURGERY Left   . SHOULDER ARTHROSCOPY Left   . SHOULDER SURGERY      Current Outpatient Medications  Medication Sig Dispense Refill  . apixaban (ELIQUIS) 5 MG TABS tablet Take 5 mg by mouth 2 (two) times daily.    . B Complex-C (B-COMPLEX WITH VITAMIN C) tablet Take 1 tablet by mouth daily.    . busPIRone (BUSPAR) 10 MG tablet Take 1 tablet (10 mg total) by mouth 3 (three) times daily. 90 tablet 3  . BYSTOLIC 20 MG TABS TAKE 1 TABLET (20 MG TOTAL) BY MOUTH DAILY. 90 tablet 3  . diclofenac sodium (VOLTAREN) 1 % GEL Apply 2-4 g topically 4 (four) times daily. 5 Tube 2  . doxazosin (CARDURA) 2 MG tablet Take 1 tablet (2 mg total) by mouth daily. 90 tablet 6  . FOLIC ACID PO Take by mouth daily.    . Melatonin 10 MG TABS Take 1 tablet by mouth at bedtime.    . milk thistle 175 MG tablet Take 175 mg by mouth daily.    . Multiple Vitamin (MULTIVITAMIN) tablet Take 1 tablet by mouth daily. Centrum Plus -Take 1 daily    . oxyCODONE (OXY IR/ROXICODONE) 5 MG immediate release tablet Take 1 tablet (5 mg total) by mouth as needed. 30 tablet 0  . PRALUENT 150 MG/ML SOPN     . tamsulosin (FLOMAX) 0.4 MG CAPS capsule Take 0.4 mg by mouth daily.     . traMADol (ULTRAM) 50 MG tablet TAKE ONE TABLET BY MOUTH EVERY 12 HOURS AS NEEDED FOR MODERATE PAIN 30 tablet 0  . triamcinolone cream (KENALOG) 0.1 % Apply 1 application topically daily  as needed.     . VOLTAREN 1 % GEL Apply 2 g topically as needed (knees).      No current facility-administered medications for this visit.      Allergies:   Clonazepam; Crestor [rosuvastatin]; Dust mite extract; Gabapentin; Hydrocodone bitartrate; Lipitor [atorvastatin]; Lisinopril; and Topamax [topiramate]   Social History:  The patient  reports that he has been smoking cigarettes. He has a 2.40 pack-year smoking history. He has never used smokeless tobacco. He reports that he drinks about 2.0 standard drinks of alcohol per week. He reports that he does not use drugs.   Family History:   family history includes Diabetes in his father; Hyperlipidemia in his father; Hypertension in his father and mother; Kidney disease in his father; Lung cancer in his father and mother.    Review of Systems: Review of Systems  Constitutional: Positive for malaise/fatigue.  Respiratory: Negative.   Cardiovascular: Negative.   Gastrointestinal: Negative.   Musculoskeletal: Negative.   Skin:       Ecchymosis left  side of face  Psychiatric/Behavioral: The patient has insomnia.   All other systems reviewed and are negative.    PHYSICAL EXAM: VS:  There were no vitals taken for this visit. , BMI There is no height or weight on file to calculate BMI. Constitutional:  oriented to person, place, and time. No distress.  HENT:  Head: Normocephalic and atraumatic.  Eyes:  no discharge. No scleral icterus.  Neck: Normal range of motion. Neck supple. No JVD present.  Cardiac: RRR; 1-2+ SEM RSB murmurs, rubs, or gallops, No edema No murmur heard. Pulmonary/Chest: Effort normal and breath sounds normal. No stridor. No respiratory distress.  no wheezes.  no rales.  no tenderness.  Abdominal: Soft.  no distension.  no tenderness.  Musculoskeletal: Normal range of motion.  no  tenderness or deformity.  Neurological:  normal muscle tone. Coordination normal. No atrophy Skin: Skin is warm and dry. No rash noted.  not diaphoretic.  Psychiatric:  normal mood and affect. behavior is normal. Thought content normal.   Recent Labs: 05/09/2017: ALT 81 08/26/2017: BUN 12; Creatinine, Ser 0.74; Hemoglobin 13.4; Platelets 66; Potassium 4.3; Sodium 140    Lipid Panel Lab Results  Component Value Date   CHOL 161 11/20/2015   HDL 46 11/20/2015   LDLCALC 86 11/20/2015   TRIG 143 11/20/2015      Wt Readings from Last 3 Encounters:  08/29/17 234 lb 8 oz (106.4 kg)  08/26/17 231 lb 7.7 oz (105 kg)  07/14/17 231 lb (104.8 kg)      ASSESSMENT AND PLAN:   Essential hypertension - Plan: EKG 12-Lead Long discussion concerning his blood pressure Will avoid clonidine given symptoms of fatigue  Previous history of leg edema, will avoid calcium channel blockers Did not tolerate isosorbide in the past Off lisinopril given his reported symptoms of leg swelling Again will recommended he stay on Cardura 1-2 mg twice a day for hypertension Continue on his bystolic He reports blood pressures in the 130 range at home, well above that range on today's visit and on previous visits  Pure hypercholesterolemia - Plan: EKG 12-Lead Tolerating  praluent Goal LDL less than 70  No changes to his medication  Typical atrial flutter ([place]) - Plan: EKG 12-Lead Maintaining normal sinus rhythm  Continue anticoagulation  Coronary artery disease involving native coronary artery of native heart without angina pectoris - Plan: EKG 12-Lead Currently with no symptoms of angina. No further workup at this time. Continue current medication regimen. stable stressed the importance of smoking cessation  Anxiety/fatigue/depression insomnia Previously was on Ambien History of alcohol abuse  Alcohol use Several per day Possibly contributing to tremor, affecting his sleep  Smoker We have encouraged him to continue to work on weaning his cigarettes and smoking cessation. He will continue to work on this and does not want any  assistance with chantix.    Total encounter time more than 25 minutes  Greater than 50% was spent in counseling and coordination of care with the patient   Disposition:   F/U  12 months   No orders of the defined types were placed in this encounter.    Signed, Esmond Plants, M.D., Ph.D. 08/31/2017  [place], [place]

## 2017-08-31 NOTE — Progress Notes (Signed)
Office Visit Note   Patient: Peter [name]           Date of Birth: 1955/04/06           MRN: [id number] Visit Date: 08/31/2017              Requested by: [name], MD [address] [place], [place] [address] PCP: [name], MD   Assessment & Plan: Visit Diagnoses:  1. Chronic left shoulder pain     Plan: 9 weeks status post arthroscopic SCD DCR and debridement of labral tear.  Doing well in physical therapy.  Definitely better than he was preoperatively.  Still having some soreness.  Has home exercise program.  Will renew Voltaren gel.  Return to the office in a month and consider cortisone injection subacromially Follow-Up Instructions: Return in about 1 month (around 10/01/2017).   Orders:  No orders of the defined types were placed in this encounter.  Meds ordered this encounter  Medications  . diclofenac sodium (VOLTAREN) 1 % GEL    Sig: Apply 2-4 g topically 4 (four) times daily.    Dispense:  5 Tube    Refill:  2    Order Specific Question:   Supervising Provider    Answer:   Garald Balding [[id number]]      Procedures: No procedures performed   Clinical Data: No additional findings.   Subjective: Chief Complaint  Patient presents with  . Follow-up    06/09/17 L SHOULDER ARTHRO FOLLOW UP STILL HAVING SOME SHOULDER PIAN AND ABLE TO LIFT ARM ABOVE SHOULDER    HPI  Review of Systems  Constitutional: Negative for fatigue and fever.  HENT: Negative for ear pain.   Eyes: Negative for pain.  Respiratory: Negative for cough and shortness of breath.   Cardiovascular: Negative for leg swelling.  Gastrointestinal: Negative for diarrhea and nausea.  Musculoskeletal: Negative for back pain and neck pain.  Skin: Negative for rash.  Allergic/Immunologic: Negative for food allergies.  Neurological: Positive for weakness. Negative for numbness.  Hematological: Does not bruise/bleed easily.  Psychiatric/Behavioral: Positive for sleep  disturbance.     Objective: Vital Signs: BP (!) 174/69 (BP Location: Right Arm, Patient Position: Sitting, Cuff Size: Normal)   Pulse (!) 59   Physical Exam  Ortho Exam awake alert and oriented x3.  Comfortable sitting.  Able to quickly and fully raise his right left arm fully overhead and scratch the middle of his back.  No evidence of adhesive capsulitis.  Some grating with the impingement position but no pain.  No pain over the acromioclavicular joint.  Just a little sore the anterior subacromial region.  Good grip and good release  Specialty Comments:  No specialty comments available.  Imaging: No results found.   PMFS History: Patient Active Problem List   Diagnosis Date Noted  . Chronic left shoulder pain 06/12/2017  . Allergic rhinitis 04/24/2017  . Thrombocytopenia ([place]) 01/23/2017  . Polypharmacy 01/20/2017  . Syncope 01/19/2017  . Elevated troponin 01/19/2017  . Sinusitis 06/08/2016  . Tremor 03/23/2016  . Paresthesia 03/23/2016  . Anxiety 02/23/2016  . Typical atrial flutter ([place]) 09/21/2015  . CAD (coronary artery disease) 05/06/2015  . Aortic atherosclerosis ([place]) 03/20/2015  . History of alcohol abuse 02/04/2015  . Alcohol induced fatty liver 02/04/2015  . Obesity (BMI 30.0-34.9) 02/04/2015  . Back pain 02/04/2015  . History of prostate cancer 02/04/2015  . Chronic venous insufficiency 10/12/2012  . Lesion of ulnar nerve  09/03/2012  . Tobacco abuse 04/05/2011  . Hyperlipidemia 01/21/2010  . Essential hypertension 12/16/2009  . Premature beats 12/16/2009   Past Medical History:  Diagnosis Date  . A-fib ([place])   . Abnormality of gait 09/03/2012  . Alcohol abuse   . Anxiety   . Arthritis   . Arthritis   . Esophageal stricture   . GERD (gastroesophageal reflux disease)   . Gout   . Hyperlipidemia   . Hypertension   . Internal hemorrhoids   . Lesion of ulnar nerve 09/03/2012   Right ulnar neuropathy  . Murmur, cardiac   . PAF (paroxysmal atrial  fibrillation) ([place])   . Palpitations   . Prostate cancer ([place]) 2016   treated with radioactive seed implant  . Seizures ([place])    pt states he had seizure 12/14  . Tubular adenoma of colon 03/2008    Family History  Problem Relation Age of Onset  . Diabetes Father   . Kidney disease Father   . Lung cancer Father        smoker  . Hyperlipidemia Father   . Hypertension Father   . Lung cancer Mother        smoke  . Hypertension Mother   . Colon cancer Neg Hx   . Pancreatic cancer Neg Hx   . Stomach cancer Neg Hx     Past Surgical History:  Procedure Laterality Date  . CATARACT EXTRACTION W/PHACO Right 07/03/2014   Procedure: CATARACT EXTRACTION PHACO AND INTRAOCULAR LENS PLACEMENT (IOC);  Surgeon: Lyla Glassing, MD;  Location: [facility] ORS;  Service: Ophthalmology;  Laterality: Right;  lot # [id number] H [place]: 00:32.3 AP% 9.1 CDE: 2.92  . CIRCUMCISION    . COLONOSCOPY    . ELBOW SURGERY    . ELECTROPHYSIOLOGIC STUDY N/A 11/02/2015   Procedure: CARDIOVERSION;  Surgeon: Minna Merritts, MD;  Location: [facility] ORS;  Service: Cardiovascular;  Laterality: N/A;  . ESOPHAGOGASTRODUODENOSCOPY (EGD) WITH PROPOFOL N/A 05/17/2016   Procedure: ESOPHAGOGASTRODUODENOSCOPY (EGD) WITH PROPOFOL;  Surgeon: Lucilla Lame, MD;  Location: [facility] END[facility]COPY;  Service: Endoscopy;  Laterality: N/A;  . EYE SURGERY    . HAND SURGERY Left   . SHOULDER ARTHROSCOPY Left   . SHOULDER SURGERY     Social History   Occupational History  . Occupation: Retired    Fish farm manager: Economist  . Occupation: UTILITIES OPER.    Employer: [employer]  Tobacco Use  . Smoking status: Current Every Day Smoker    Packs/day: 0.10    Years: 24.00    Pack years: 2.40    Types: Cigarettes  . Smokeless tobacco: Never Used  . Tobacco comment: down to 4-5  ciggs a day.  Substance and Sexual Activity  . Alcohol use: Yes    Alcohol/week: 2.0 standard drinks    Types: 2 Cans of beer per week    Frequency: Never  . Drug use: No    Types:  Marijuana    Comment: 1970's occ.   Marland Kitchen Sexual activity: Not on file

## 2017-09-01 ENCOUNTER — Ambulatory Visit: Payer: 59 | Admitting: Cardiovascular Disease

## 2017-09-01 DIAGNOSIS — R0989 Other specified symptoms and signs involving the circulatory and respiratory systems: Secondary | ICD-10-CM

## 2017-09-04 ENCOUNTER — Encounter: Payer: Self-pay | Admitting: Cardiovascular Disease

## 2017-09-04 ENCOUNTER — Telehealth: Payer: Self-pay | Admitting: Family Medicine

## 2017-09-04 MED ORDER — TRAMADOL HCL 50 MG PO TABS: ORAL_TABLET | ORAL | 0 refills | Status: DC

## 2017-09-04 NOTE — Telephone Encounter (Signed)
Copied from [place] [id number]. Topic: Quick Communication - See Telephone Encounter >> Sep 04, 2017  3:48 PM Synthia Innocent wrote: CRM for notification. See Telephone encounter for: 09/04/17. patient is no longer taking oxyCODONE (OXY IR/ROXICODONE) 5 MG immediate release tablet. Would like to know if Dr Caryl Bis will fill his  traMADol (ULTRAM) 50 MG tablet, able to increase dose while doing PT for shoulder surgery? [employer].

## 2017-09-04 NOTE — Telephone Encounter (Signed)
Please advise 

## 2017-09-04 NOTE — Telephone Encounter (Signed)
Refill sent to pharmacy.  If he would like to consider an increased dose we will need to see him for follow-up in the office.  Thanks.

## 2017-09-05 NOTE — Telephone Encounter (Signed)
Where can I schedule?

## 2017-09-05 NOTE — Telephone Encounter (Signed)
Be scheduled this coming Tuesday.

## 2017-09-06 NOTE — Telephone Encounter (Signed)
Patient notified and scheduled 

## 2017-09-07 DIAGNOSIS — J301 Allergic rhinitis due to pollen: Secondary | ICD-10-CM | POA: Diagnosis not present

## 2017-09-12 ENCOUNTER — Ambulatory Visit (INDEPENDENT_AMBULATORY_CARE_PROVIDER_SITE_OTHER): Payer: 59 | Admitting: Family Medicine

## 2017-09-12 ENCOUNTER — Encounter: Payer: Self-pay | Admitting: Family Medicine

## 2017-09-12 ENCOUNTER — Telehealth: Payer: Self-pay | Admitting: Family Medicine

## 2017-09-12 DIAGNOSIS — F1011 Alcohol abuse, in remission: Secondary | ICD-10-CM

## 2017-09-12 DIAGNOSIS — M545 Low back pain: Principal | ICD-10-CM

## 2017-09-12 DIAGNOSIS — M25512 Pain in left shoulder: Secondary | ICD-10-CM | POA: Diagnosis not present

## 2017-09-12 DIAGNOSIS — Z87898 Personal history of other specified conditions: Secondary | ICD-10-CM | POA: Diagnosis not present

## 2017-09-12 DIAGNOSIS — I10 Essential (primary) hypertension: Secondary | ICD-10-CM

## 2017-09-12 DIAGNOSIS — G8929 Other chronic pain: Secondary | ICD-10-CM

## 2017-09-12 DIAGNOSIS — R0789 Other chest pain: Secondary | ICD-10-CM | POA: Diagnosis not present

## 2017-09-12 DIAGNOSIS — D696 Thrombocytopenia, unspecified: Secondary | ICD-10-CM

## 2017-09-12 MED ORDER — TRAMADOL HCL 50 MG PO TABS: ORAL_TABLET | ORAL | 0 refills | Status: DC

## 2017-09-12 NOTE — Telephone Encounter (Signed)
Fyi.

## 2017-09-12 NOTE — Telephone Encounter (Signed)
Please follow-up with the patient tomorrow to see if he would like a referral to a pain specialist. Thanks.

## 2017-09-12 NOTE — Patient Instructions (Signed)
Nice to see you. Please see your cardiologist as planned. We will refill your tramadol.  If you get drowsy when taking this please do not drive.  Do not drink alcohol when taking this medication.

## 2017-09-12 NOTE — Telephone Encounter (Signed)
Noted  

## 2017-09-12 NOTE — Assessment & Plan Note (Signed)
I have advised the patient he needs to follow-up with hematology.  I offered to arrange this appointment for him though he deferred and stated he would contact them himself.

## 2017-09-12 NOTE — Telephone Encounter (Signed)
Pt understands he needs to take 1 tab twice daily and he is agreeable to suggestion

## 2017-09-12 NOTE — Assessment & Plan Note (Signed)
Patient's prior chest pain seems to been atypical and likely GI in nature.  No significant changes in his evaluation to indicate cardiac cause.  He has follow-up with cardiology later this week and he will keep that appointment.  He will discuss blood pressure medication with them as well.  He will then likely need to follow-up with GI once they rule out a cardiac cause.

## 2017-09-12 NOTE — Progress Notes (Signed)
Tommi Rumps, MD Phone: [phone]  Peter [name] is a 62 y.o. male who presents today for f/u.  CC: htn, chest pain, chronic shoulder and back pain  HYPERTENSION  Disease Monitoring  Home BP Monitoring 140-150/70s typically, slightly higher with recent pain   Chest pain- see below    Dyspnea- no Medications  Compliance-  Taking bystolic and cardura, patient reports taking half a tablet of Cardura twice daily  Chest pain: Patient notes 3 weeks ago he was seen in the emergency department after developing centralized chest discomfort.  There was no radiation or diaphoresis.  No shortness of breath.  He notes this has been going on a couple times a week since then.  Last occurred several days ago.  He received a GI cocktail and his symptoms resolved.  It appears he had stable mildly elevated troponins.  He is currently on Prilosec as needed.  He has had no blood in his stool.  He reports his neurologist placed him on BuSpar to see if that would help as there may be some anxiety component.  Patient noted to have macrocytosis and thrombocytopenia on labs from the emergency department.  He was evaluated by hematology previously and it appears this was felt to be related to his alcohol use.  It looks as though they wanted him to follow-up though he reports they did not advise him of that.  Chronic shoulder and back pain: Patient had shoulder surgery previously on his left shoulder.  He was taking oxycodone for the pain though his orthopedist discontinued this.  He is now taking tramadol 2 tablets at bedtime which he states the orthopedist advised he can do.  He has good mobility now.  Occasional discomfort.  He does note some chronic low back discomfort that is rated at a 2-3/10 at times.  He notes no incontinence or saddle anesthesia.  The tramadol does not make him drowsy.  He drinks about 2 beers a day in the afternoon and does not drink alcohol near when he takes his tramadol.  He uses  Voltaren gel as well.     Social History   Tobacco Use  Smoking Status Current Every Day Smoker  . Packs/day: 0.10  . Years: 24.00  . Pack years: 2.40  . Types: Cigarettes  Smokeless Tobacco Never Used  Tobacco Comment   down to 4-5  ciggs a day.     ROS see history of present illness  Objective  Physical Exam Vitals:   09/12/17 1403  BP: (!) 150/86  Pulse: 60  Resp: 16  Temp: 98.2 F (36.8 C)  SpO2: 98%    BP Readings from Last 3 Encounters:  09/12/17 (!) 150/86  08/31/17 (!) 174/69  08/29/17 (!) 161/78   Wt Readings from Last 3 Encounters:  09/12/17 236 lb 2 oz (107.1 kg)  08/29/17 234 lb 8 oz (106.4 kg)  08/26/17 231 lb 7.7 oz (105 kg)    Physical Exam  Constitutional: No distress.  Cardiovascular: Normal rate, regular rhythm and normal heart sounds.  Pulmonary/Chest: Effort normal and breath sounds normal.  Musculoskeletal: He exhibits no edema.  Full active and passive range of motion left shoulder, 5/5 strength bilateral quads, hamstrings, plantar flexion, and dorsiflexion, sensation to light touch intact bilateral lower extremities  Neurological: He is alert.  Skin: Skin is warm and dry. He is not diaphoretic.     Assessment/Plan: Please see individual problem list.  Thrombocytopenia ([place]) I have advised the patient he needs to follow-up with  hematology.  I offered to arrange this appointment for him though he deferred and stated he would contact them himself.  Chronic left shoulder pain Chronic issue.  He will continue to follow with orthopedics.  Initially I did discuss increasing his tramadol dose to 100 mg twice daily though after the patient left I reviewed hepatic dosing and noted that his dose should be limited to 50 mg twice daily.  Please see phone note for documentation.  Discussed with pharmacy and they altered his prescription to reflect 50 mg twice daily.  We will offer the patient pain management referral as well.  History of  alcohol abuse Discussed patient's alcohol use with him.  He noted this was not a problem for him and he could cut back if he desired.  Atypical chest pain Patient's prior chest pain seems to been atypical and likely GI in nature.  No significant changes in his evaluation to indicate cardiac cause.  He has follow-up with cardiology later this week and he will keep that appointment.  He will discuss blood pressure medication with them as well.  He will then likely need to follow-up with GI once they rule out a cardiac cause.  Essential hypertension Blood pressure seems to be uncontrolled.  He will see his cardiologist later this week and will discuss with them.  I will forward this note to his cardiologist to see if he has any recommendations.   No orders of the defined types were placed in this encounter.   Meds ordered this encounter  Medications  . DISCONTD: traMADol (ULTRAM) 50 MG tablet    Sig: TAKE TWO TABLETS BY MOUTH EVERY 12 HOURS AS NEEDED FOR MODERATE PAIN    Dispense:  60 tablet    Refill:  0  . traMADol (ULTRAM) 50 MG tablet    Sig: TAKE ONE TABLET BY MOUTH EVERY 12 HOURS AS NEEDED FOR MODERATE PAIN    Dispense:  60 tablet    Refill:  0     Tommi Rumps, MD [place]

## 2017-09-12 NOTE — Assessment & Plan Note (Signed)
Chronic issue.  He will continue to follow with orthopedics.  Initially I did discuss increasing his tramadol dose to 100 mg twice daily though after the patient left I reviewed hepatic dosing and noted that his dose should be limited to 50 mg twice daily.  Please see phone note for documentation.  Discussed with pharmacy and they altered his prescription to reflect 50 mg twice daily.  We will offer the patient pain management referral as well.

## 2017-09-12 NOTE — Telephone Encounter (Signed)
I attempted to contact the patient regarding my prior message. There was no answer. I will forward to [facility] to try to contact him later this afternoon.

## 2017-09-12 NOTE — Telephone Encounter (Addendum)
Patient was seen in the office today.  We initially discussed increasing his dose of tramadol though after he left I noted the appropriate dosing given his history of liver dysfunction.  I attempted to contact him and let him know this and that we would not be able to increase the dose though there was no answer.  I left a message asking him to contact the office.  I contacted his pharmacy to let them know about changing the dose from two tablets twice daily back to one tablet twice daily. I advised if he had questions they should have the patient contact the office. I can offer a referral to pain management if he would like. I can also check with our clinical pharmacist to see what her recommendation would be for dosing given his liver issues. I will forward to [facility] to try and contact the patient this afternoon.

## 2017-09-12 NOTE — Assessment & Plan Note (Signed)
Blood pressure seems to be uncontrolled.  He will see his cardiologist later this week and will discuss with them.  I will forward this note to his cardiologist to see if he has any recommendations.

## 2017-09-12 NOTE — Assessment & Plan Note (Addendum)
Discussed patient's alcohol use with him.  He noted this was not a problem for him and he could cut back if he desired.

## 2017-09-13 NOTE — Telephone Encounter (Signed)
Patient is open to pain management referral . Would like to stay local here in [place]. Thanks

## 2017-09-14 ENCOUNTER — Telehealth: Payer: Self-pay | Admitting: Gastroenterology

## 2017-09-14 DIAGNOSIS — M25612 Stiffness of left shoulder, not elsewhere classified: Secondary | ICD-10-CM | POA: Diagnosis not present

## 2017-09-14 DIAGNOSIS — J301 Allergic rhinitis due to pollen: Secondary | ICD-10-CM | POA: Diagnosis not present

## 2017-09-14 DIAGNOSIS — M6281 Muscle weakness (generalized): Secondary | ICD-10-CM | POA: Diagnosis not present

## 2017-09-14 DIAGNOSIS — M25512 Pain in left shoulder: Secondary | ICD-10-CM | POA: Diagnosis not present

## 2017-09-14 NOTE — Telephone Encounter (Signed)
Pt is ready to schedule EGD but you wanted him to have his cbc and INR checked before scheduling. Please review recent CBC done on 08/26/17 and advise.

## 2017-09-14 NOTE — Telephone Encounter (Signed)
Pt called in and stated that he thought that the traMADol (ULTRAM) 50 MG tablet [[id number]]   Was going to be increased.  He stated he went to pick It up and it was 1 every 12 hours.  He thought that they quaintly was going to up per day?     Best number   [phone]

## 2017-09-14 NOTE — Telephone Encounter (Signed)
Pt  Would like to schedule  EGD at [facility] he just seen Dr. [name] in  May he has Sept 16th that week in mind please call pt

## 2017-09-14 NOTE — Telephone Encounter (Signed)
Referral placed.

## 2017-09-15 ENCOUNTER — Ambulatory Visit (INDEPENDENT_AMBULATORY_CARE_PROVIDER_SITE_OTHER): Payer: 59 | Admitting: Cardiovascular Disease

## 2017-09-15 ENCOUNTER — Encounter: Payer: Self-pay | Admitting: Cardiovascular Disease

## 2017-09-15 VITALS — BP 128/60 | HR 52 | Ht 72.0 in | Wt 234.5 lb

## 2017-09-15 DIAGNOSIS — I872 Venous insufficiency (chronic) (peripheral): Secondary | ICD-10-CM

## 2017-09-15 DIAGNOSIS — I25118 Atherosclerotic heart disease of native coronary artery with other forms of angina pectoris: Secondary | ICD-10-CM | POA: Diagnosis not present

## 2017-09-15 DIAGNOSIS — F1011 Alcohol abuse, in remission: Secondary | ICD-10-CM

## 2017-09-15 DIAGNOSIS — Z720 Tobacco use: Secondary | ICD-10-CM

## 2017-09-15 DIAGNOSIS — E7800 Pure hypercholesterolemia, unspecified: Secondary | ICD-10-CM | POA: Diagnosis not present

## 2017-09-15 DIAGNOSIS — I700 Atherosclerosis of aorta: Secondary | ICD-10-CM

## 2017-09-15 DIAGNOSIS — Z87898 Personal history of other specified conditions: Secondary | ICD-10-CM

## 2017-09-15 DIAGNOSIS — I10 Essential (primary) hypertension: Secondary | ICD-10-CM

## 2017-09-15 DIAGNOSIS — I483 Typical atrial flutter: Secondary | ICD-10-CM

## 2017-09-15 MED ORDER — NITROGLYCERIN 0.4 MG SL SUBL: 0.4000 mg | SUBLINGUAL_TABLET | SUBLINGUAL | 3 refills | Status: AC | PRN

## 2017-09-15 NOTE — Progress Notes (Signed)
Cardiology Office Note  Date:  09/15/2017   ID:  Peter [name], DOB [birth year], MRN [id number]  PCP:  [name], MD   Chief Complaint  Patient presents with  . other    [place] due to chest pain and elevated BP. Meds reviewed verbally with pt.    HPI:  Peter [name] is a 62 year old gentleman with  long history of smoking, 4 cigs a day hypertension,  daily alcohol drinking,  prostate cancer ,  statin intolerance ,    underlying liver dysfunction, elevated total bilirubin CAD, CT coronary calcium score 2778 in 04/2015 Stress test 07/2014 no ischemia Cardioversion 11/02/2015 For atrial flutter tolerating praluent , dramatic improvement in his cholesterol down to 160 from 260 who presents for follow-up of his coronary artery disease, hypertension, hyperlipidemia  3 weeks ago, Chest tightness, Went to the ER 08/26/2017 TNT 0.05  Had shoulder surgery, was having pain, Good recovery Residual pain  Blood pressure up and down O/n was 190, took extra cardura  Down to 2 cigs a day  Dr. [name] to do balloon dilation  Lots of anxiety at home  Wife having trouble with her job  He is taking bystolic in the morning  taking Cardura 1 mg in the evening Blood pressure does not seem to hold overnight  Regular exercise program  Try to eat better No falls Previous fall with alcohol and clonazepam Continues to have tremor  EKG personally reviewed by myself on todays visit Shows normal sinus rhythm rate 52 bpm no significant ST or T-wave changes  Other past medical history reviewed Previously passed out, hit his face on the left Was in the hospital, blood thinners held overnight 01/19/2017  anticoagulation held until platelets improved  hemoglobin of 9 down from  13    Echo January 2019  EF normal >65%  Previous EGD with Dr. [name],  Essentially normal procedure, no sign of bleeding  echocardiogram 02/2016 showing normal LV function  Chronic back pain He went to the ER  for back pain 11/22/2015 Started On tramadol PRN  Limited by back pain , was told he could get a "shot" "shaking" for 2 years since prostate seeds, stopped librium Still with heavy ETOH, previous notes indicating 4-5 beers per day Frequent nocturia Has been taking lisinopril 40 mg x 2, presumably because blood pressure was running high, now running out of his medication. Did not call our office  Previous stress test July 2016 showing no ischemia CT scan of the chest, CT coronary calcium score discussed with him in detail, images pulled up in the office. His scores close to 3000. He denies any anginal symptoms. Images show diffuse heavy calcification/calcified atherosclerosis in the LAD, diagonal branch, ostial and proximal RCA. No significant disease noted in the left circumflex.   he reports being debilitated when he was on Crestor, Lipitor He had diffuse muscle pain in his legs, has tried different doses, does not want to retry a statin given the debilitating side effects. Feels it made it impossible to get around, on top of his arthritis, severe knee pain, it was just too much  CT scan of the abdomen shows scattered diffuse mild aortic atherosclerosis.  Lab work reviewed with him showing hemoglobin A1c 5.5 , total cholesterol more than 230   PMH:   has a past medical history of A-fib ([place]), Abnormality of gait (09/03/2012), Alcohol abuse, Anxiety, Arthritis, Arthritis, Esophageal stricture, GERD (gastroesophageal reflux disease), Gout, Hyperlipidemia, Hypertension, Internal hemorrhoids, Lesion of ulnar nerve (09/03/2012),  Murmur, cardiac, PAF (paroxysmal atrial fibrillation) ([place]), Palpitations, Prostate cancer ([place]) (2016), Seizures ([place]), and Tubular adenoma of colon (03/2008).  PSH:    Past Surgical History:  Procedure Laterality Date  . CATARACT EXTRACTION W/PHACO Right 07/03/2014   Procedure: CATARACT EXTRACTION PHACO AND INTRAOCULAR LENS PLACEMENT (IOC);  Surgeon: Lyla Glassing, MD;  Location: [facility] ORS;  Service: Ophthalmology;  Laterality: Right;  lot # [id number] [place]: 00:32.3 AP% 9.1 CDE: 2.92  . CIRCUMCISION    . COLONOSCOPY    . ELBOW SURGERY    . ELECTROPHYSIOLOGIC STUDY N/A 11/02/2015   Procedure: CARDIOVERSION;  Surgeon: Minna Merritts, MD;  Location: [facility] ORS;  Service: Cardiovascular;  Laterality: N/A;  . ESOPHAGOGASTRODUODENOSCOPY (EGD) WITH PROPOFOL N/A 05/17/2016   Procedure: ESOPHAGOGASTRODUODENOSCOPY (EGD) WITH PROPOFOL;  Surgeon: Lucilla Lame, MD;  Location: [facility] ENDOSCOPY;  Service: Endoscopy;  Laterality: N/A;  . EYE SURGERY    . HAND SURGERY Left   . SHOULDER ARTHROSCOPY Left   . SHOULDER SURGERY      Current Outpatient Medications  Medication Sig Dispense Refill  . apixaban (ELIQUIS) 5 MG TABS tablet Take 5 mg by mouth 2 (two) times daily.    . B Complex-C (B-COMPLEX WITH VITAMIN C) tablet Take 1 tablet by mouth daily.    . busPIRone (BUSPAR) 10 MG tablet Take 1 tablet (10 mg total) by mouth 3 (three) times daily. 90 tablet 3  . BYSTOLIC 20 MG TABS TAKE 1 TABLET (20 MG TOTAL) BY MOUTH DAILY. 90 tablet 3  . doxazosin (CARDURA) 2 MG tablet Take 1 tablet (2 mg total) by mouth daily. 90 tablet 6  . FOLIC ACID PO Take by mouth daily.    . Melatonin 10 MG TABS Take 1 tablet by mouth at bedtime.    . milk thistle 175 MG tablet Take 175 mg by mouth daily.    . Multiple Vitamin (MULTIVITAMIN) tablet Take 1 tablet by mouth daily. Centrum Plus -Take 1 daily    . PRALUENT 150 MG/ML SOPN     . tamsulosin (FLOMAX) 0.4 MG CAPS capsule Take 0.4 mg by mouth daily.     . traMADol (ULTRAM) 50 MG tablet TAKE ONE TABLET BY MOUTH EVERY 12 HOURS AS NEEDED FOR MODERATE PAIN 60 tablet 0  . triamcinolone cream (KENALOG) 0.1 % Apply 1 application topically daily as needed.     . VOLTAREN 1 % GEL Apply 2 g topically as needed (knees).      No current facility-administered medications for this visit.      Allergies:   Clonazepam; Crestor [rosuvastatin];  Dust mite extract; Gabapentin; Hydrocodone bitartrate; Lipitor [atorvastatin]; Lisinopril; and Topamax [topiramate]   Social History:  The patient  reports that he has been smoking cigarettes. He has a 2.40 pack-year smoking history. He has never used smokeless tobacco. He reports that he drinks about 2.0 standard drinks of alcohol per week. He reports that he does not use drugs.   Family History:   family history includes Diabetes in his father; Hyperlipidemia in his father; Hypertension in his father and mother; Kidney disease in his father; Lung cancer in his father and mother.    Review of Systems: Review of Systems  Constitutional: Negative.   Respiratory: Negative.   Cardiovascular: Negative.   Gastrointestinal: Negative.   Musculoskeletal: Negative.   Psychiatric/Behavioral: The patient is nervous/anxious and has insomnia.   All other systems reviewed and are negative.    PHYSICAL EXAM: VS:  BP 128/60 (BP Location: Left Arm, Patient Position: Sitting,  Cuff Size: Normal)   Pulse (!) 52   Ht 6' (1.829 m)   Wt 234 lb 8 oz (106.4 kg)   BMI 31.80 kg/m  , BMI Body mass index is 31.8 kg/m.  No significant change compared to previous visit Constitutional:  oriented to person, place, and time. No distress.  HENT:  Head: Normocephalic and atraumatic.  Eyes:  no discharge. No scleral icterus.  Neck: Normal range of motion. Neck supple. No JVD present.  Cardiac: RRR; 1-2+ SEM RSB murmurs, rubs, or gallops, No edema No murmur heard. Pulmonary/Chest: Effort normal and breath sounds normal. No stridor. No respiratory distress.  no wheezes.  no rales.  no tenderness.  Abdominal: Soft.  no distension.  no tenderness.  Musculoskeletal: Normal range of motion.  no  tenderness or deformity.  Neurological:  normal muscle tone. Coordination normal. No atrophy Skin: Skin is warm and dry. No rash noted. not diaphoretic.  Psychiatric:  normal mood and affect. behavior is normal. Thought content  normal.   Recent Labs: 05/09/2017: ALT 81 08/26/2017: BUN 12; Creatinine, Ser 0.74; Hemoglobin 13.4; Platelets 66; Potassium 4.3; Sodium 140    Lipid Panel Lab Results  Component Value Date   CHOL 161 11/20/2015   HDL 46 11/20/2015   LDLCALC 86 11/20/2015   TRIG 143 11/20/2015      Wt Readings from Last 3 Encounters:  09/15/17 234 lb 8 oz (106.4 kg)  09/12/17 236 lb 2 oz (107.1 kg)  08/29/17 234 lb 8 oz (106.4 kg)      ASSESSMENT AND PLAN:   Essential hypertension - Plan: EKG 12-Lead Long discussion concerning his blood pressure Will avoid clonidine given symptoms of fatigue  Previous history of leg edema, will avoid calcium channel blockers Did not tolerate isosorbide in the past Off lisinopril given his reported symptoms of leg swelling Recommend he stay on beta-blocker in the morning and increase the Cardura up to 2 mg in the evening Labile blood pressure likely exacerbated by anxiety  Pure hypercholesterolemia - Plan: EKG 12-Lead Tolerating  praluent Goal LDL less than 70  Order placed for liver and lipid  Typical atrial flutter ([place]) - Plan: EKG 12-Lead Maintaining normal sinus rhythm  Continue anticoagulation Stable  Coronary artery disease involving native coronary artery of native heart without angina pectoris - Plan: EKG 12-Lead Recent episodes of chest pain Likely secondary to GI etiology.  At rest Nitro given for possible esophageal spasm Recommend he proceed with esophageal dilatation with Dr. [name]  Anxiety/fatigue/depression insomnia Previously was on Ambien History of alcohol abuse Having more anxiety concerning finances  Alcohol use Not discussed with him today Chronic tremor, affecting his sleep  Smoke  down to 2 Cigarettes per day Discussed with him in detail    Total encounter time more than 45 minutes  Greater than 50% was spent in counseling and coordination of care with the patient   Disposition:   F/U  12 months   Orders  Placed This Encounter  Procedures  . EKG 12-Lead     Signed, Esmond Plants, M.D., Ph.D. 09/15/2017  [place], [place]

## 2017-09-15 NOTE — Telephone Encounter (Signed)
Appears pt needed f/u with Dr. Chauncey Cruel please sch

## 2017-09-15 NOTE — Patient Instructions (Addendum)
Medication Instructions:   Increase the cardura/dozaxosin up to whole pill every evening. 2 mg  Take nitro for chest pain  If you continue to have more chest pain Start prilosec daily  Labwork:  Labs slip for [facility]: Liver and lipids  Testing/Procedures:  No further testing at this time   Follow-Up: It was a pleasure seeing you in the office today. Please call us if you have new issues that need to be addressed before your next appt.  [phone]  Your physician wants you to follow-up in: 12 months.  You will receive a reminder letter in the mail two months in advance. If you don't receive a letter, please call our office to schedule the follow-up appointment.  If you need a refill on your cardiac medications before your next appointment, please call your pharmacy.  For educational health videos Log in to : [email or web address] Or : [email or web address], password : [email or web address]

## 2017-09-15 NOTE — Telephone Encounter (Signed)
Patient saw Dr. Caryl Bis on 09/12/17 and looks like Dr. Caryl Bis decided not to increase due to Hepatic reasons right.

## 2017-09-15 NOTE — Telephone Encounter (Signed)
Patient advised of result and verbalized an understanding.  

## 2017-09-18 ENCOUNTER — Other Ambulatory Visit: Payer: Self-pay | Admitting: Cardiovascular Disease

## 2017-09-18 DIAGNOSIS — E7800 Pure hypercholesterolemia, unspecified: Secondary | ICD-10-CM | POA: Diagnosis not present

## 2017-09-18 DIAGNOSIS — I10 Essential (primary) hypertension: Secondary | ICD-10-CM | POA: Diagnosis not present

## 2017-09-18 DIAGNOSIS — I700 Atherosclerosis of aorta: Secondary | ICD-10-CM | POA: Diagnosis not present

## 2017-09-18 DIAGNOSIS — Z720 Tobacco use: Secondary | ICD-10-CM | POA: Diagnosis not present

## 2017-09-18 DIAGNOSIS — I483 Typical atrial flutter: Secondary | ICD-10-CM | POA: Diagnosis not present

## 2017-09-18 NOTE — Telephone Encounter (Signed)
Let the patient know that his hemoglobin is normal but his platelets are still low.  His MCV is high and he should probably have his B12 and folate checked to make sure he is not deficient.

## 2017-09-19 LAB — HEPATIC FUNCTION PANEL
ALBUMIN: 3.7 g/dL (ref 3.6–4.8)
ALK PHOS: 121 IU/L — AB (ref 39–117)
ALT: 38 IU/L (ref 0–44)
AST: 75 IU/L — AB (ref 0–40)
BILIRUBIN TOTAL: 3 mg/dL — AB (ref 0.0–1.2)
BILIRUBIN, DIRECT: 0.94 mg/dL — AB (ref 0.00–0.40)
Total Protein: 6.7 g/dL (ref 6.0–8.5)

## 2017-09-19 LAB — LIPID PANEL W/O CHOL/HDL RATIO
CHOLESTEROL TOTAL: 147 mg/dL (ref 100–199)
HDL: 65 mg/dL (ref >39)
LDL Calculated: 56 mg/dL (ref 0–99)
Triglycerides: 132 mg/dL (ref 0–149)
VLDL Cholesterol Cal: 26 mg/dL (ref 5–40)

## 2017-09-19 LAB — SPECIMEN STATUS REPORT

## 2017-09-19 NOTE — Telephone Encounter (Signed)
LVM for pt to return my call.

## 2017-09-19 NOTE — Telephone Encounter (Signed)
Patient stated he will cal and follow up next week when PCP in office ,

## 2017-09-21 DIAGNOSIS — J301 Allergic rhinitis due to pollen: Secondary | ICD-10-CM | POA: Diagnosis not present

## 2017-09-22 NOTE — Telephone Encounter (Signed)
Advised pt of your recommendation. I was able to use recent labs to add B12 and folate. Pt states he really needs the EGD done but I advised him his platelets are still too low. He says he's taking Eliquis and his cardiac workup was good by Dr. Rockey Situ. Is there anything he can do about that?

## 2017-09-25 NOTE — Telephone Encounter (Signed)
If this is just for surveillance of varices he can do it without worrying about his platelets or his blood thinner.  If he is having dysphasia than his platelets are too low to do any therapeutic interventions.

## 2017-09-26 ENCOUNTER — Ambulatory Visit: Payer: 59 | Admitting: Cardiovascular Disease

## 2017-09-26 NOTE — Telephone Encounter (Signed)
You wanted me to check his B12 and folate. Please review under labs. Original order was one by Dr. Rockey Situ which is why you didn't receive results.

## 2017-09-28 ENCOUNTER — Ambulatory Visit: Payer: 59 | Attending: Nurse Practitioner | Admitting: Nurse Practitioner

## 2017-09-28 ENCOUNTER — Other Ambulatory Visit: Payer: Self-pay

## 2017-09-28 ENCOUNTER — Encounter: Payer: Self-pay | Admitting: Nurse Practitioner

## 2017-09-28 DIAGNOSIS — M899 Disorder of bone, unspecified: Secondary | ICD-10-CM

## 2017-09-28 DIAGNOSIS — I10 Essential (primary) hypertension: Secondary | ICD-10-CM | POA: Insufficient documentation

## 2017-09-28 DIAGNOSIS — M5442 Lumbago with sciatica, left side: Secondary | ICD-10-CM

## 2017-09-28 DIAGNOSIS — F17210 Nicotine dependence, cigarettes, uncomplicated: Secondary | ICD-10-CM | POA: Insufficient documentation

## 2017-09-28 DIAGNOSIS — M546 Pain in thoracic spine: Secondary | ICD-10-CM | POA: Diagnosis not present

## 2017-09-28 DIAGNOSIS — Z79899 Other long term (current) drug therapy: Secondary | ICD-10-CM | POA: Diagnosis not present

## 2017-09-28 DIAGNOSIS — I480 Paroxysmal atrial fibrillation: Secondary | ICD-10-CM | POA: Insufficient documentation

## 2017-09-28 DIAGNOSIS — M545 Low back pain, unspecified: Secondary | ICD-10-CM | POA: Insufficient documentation

## 2017-09-28 DIAGNOSIS — M25512 Pain in left shoulder: Secondary | ICD-10-CM | POA: Insufficient documentation

## 2017-09-28 DIAGNOSIS — M109 Gout, unspecified: Secondary | ICD-10-CM | POA: Diagnosis not present

## 2017-09-28 DIAGNOSIS — M5441 Lumbago with sciatica, right side: Secondary | ICD-10-CM

## 2017-09-28 DIAGNOSIS — Z9841 Cataract extraction status, right eye: Secondary | ICD-10-CM | POA: Insufficient documentation

## 2017-09-28 DIAGNOSIS — F1010 Alcohol abuse, uncomplicated: Secondary | ICD-10-CM | POA: Diagnosis not present

## 2017-09-28 DIAGNOSIS — I872 Venous insufficiency (chronic) (peripheral): Secondary | ICD-10-CM | POA: Insufficient documentation

## 2017-09-28 DIAGNOSIS — Z841 Family history of disorders of kidney and ureter: Secondary | ICD-10-CM | POA: Insufficient documentation

## 2017-09-28 DIAGNOSIS — F419 Anxiety disorder, unspecified: Secondary | ICD-10-CM | POA: Insufficient documentation

## 2017-09-28 DIAGNOSIS — G894 Chronic pain syndrome: Secondary | ICD-10-CM | POA: Diagnosis not present

## 2017-09-28 DIAGNOSIS — Z789 Other specified health status: Secondary | ICD-10-CM

## 2017-09-28 DIAGNOSIS — E785 Hyperlipidemia, unspecified: Secondary | ICD-10-CM | POA: Diagnosis not present

## 2017-09-28 DIAGNOSIS — M79605 Pain in left leg: Secondary | ICD-10-CM

## 2017-09-28 DIAGNOSIS — M79604 Pain in right leg: Secondary | ICD-10-CM

## 2017-09-28 DIAGNOSIS — Z8249 Family history of ischemic heart disease and other diseases of the circulatory system: Secondary | ICD-10-CM | POA: Insufficient documentation

## 2017-09-28 DIAGNOSIS — E669 Obesity, unspecified: Secondary | ICD-10-CM | POA: Diagnosis not present

## 2017-09-28 DIAGNOSIS — Z801 Family history of malignant neoplasm of trachea, bronchus and lung: Secondary | ICD-10-CM | POA: Insufficient documentation

## 2017-09-28 DIAGNOSIS — Z7901 Long term (current) use of anticoagulants: Secondary | ICD-10-CM | POA: Insufficient documentation

## 2017-09-28 DIAGNOSIS — G8929 Other chronic pain: Secondary | ICD-10-CM

## 2017-09-28 DIAGNOSIS — Z8546 Personal history of malignant neoplasm of prostate: Secondary | ICD-10-CM | POA: Diagnosis not present

## 2017-09-28 DIAGNOSIS — K219 Gastro-esophageal reflux disease without esophagitis: Secondary | ICD-10-CM | POA: Diagnosis not present

## 2017-09-28 DIAGNOSIS — Z79891 Long term (current) use of opiate analgesic: Secondary | ICD-10-CM

## 2017-09-28 DIAGNOSIS — Z6832 Body mass index (BMI) 32.0-32.9, adult: Secondary | ICD-10-CM | POA: Diagnosis not present

## 2017-09-28 DIAGNOSIS — Z833 Family history of diabetes mellitus: Secondary | ICD-10-CM | POA: Insufficient documentation

## 2017-09-28 DIAGNOSIS — Z961 Presence of intraocular lens: Secondary | ICD-10-CM | POA: Diagnosis not present

## 2017-09-28 DIAGNOSIS — I2510 Atherosclerotic heart disease of native coronary artery without angina pectoris: Secondary | ICD-10-CM | POA: Diagnosis not present

## 2017-09-28 NOTE — Progress Notes (Signed)
Safety precautions to be maintained throughout the outpatient stay will include: orient to surroundings, keep bed in low position, maintain call bell within reach at all times, provide assistance with transfer out of bed and ambulation.  

## 2017-09-28 NOTE — Progress Notes (Signed)
Patient's Name: Peter [name]  MRN: [id number]  Referring Provider: [name], MD  DOB: September 13, 1955  PCP: [name], MD  DOS: 09/28/2017  Note by: Dionisio David NP  Service setting: Ambulatory outpatient  Specialty: Interventional Pain Management  Location: [facility] (AMB) Pain Management Facility    Patient type: New Patient    Primary Reason(s) for Visit: Initial Patient Evaluation CC: Back Pain (lower); Shoulder Pain (left); and Knee Pain (right)  HPI  [name] is a 62 y.o. year old, male patient, who comes today for an initial evaluation. He has Essential hypertension; Premature beats; Hyperlipidemia; Tobacco abuse; Lesion of ulnar nerve; Chronic venous insufficiency; History of alcohol abuse; Alcohol induced fatty liver; Obesity (BMI 30.0-34.9); Back pain; History of prostate cancer; Aortic atherosclerosis ([place]); CAD (coronary artery disease); Typical atrial flutter ([place]); Anxiety; Tremor; Paresthesia; Sinusitis; Syncope; Elevated troponin; Polypharmacy; Thrombocytopenia ([place]); Allergic rhinitis; Chronic left shoulder pain; Atypical chest pain; Chronic bilateral low back pain with bilateral sciatica (Primary Area of Pain) (L>R); Chronic pain of both lower extremities (Secondary Area of Pain) (L>R); Chronic pain syndrome; Long term current use of opiate analgesic; Pharmacologic therapy; Disorder of skeletal system; Problems influencing health status; and Chronic midline thoracic back pain (Tertiary Area of Pain) to on their problem list.. His primarily concern today is the Back Pain (lower); Shoulder Pain (left); and Knee Pain (right)  Pain Assessment: Location: Lower Back Radiating: denies Onset: More than a month ago Duration: Chronic pain Quality: Constant, Aching(annoying) Severity: 2 /10 (subjective, self-reported pain score)  Note: Reported level is compatible with observation.                          Effect on ADL: difficult to accomplish ADLs Timing:  Constant Modifying factors: tramadol BP: (!) 184/82  HR: (!) 59  Onset and Duration: Present longer than 3 months Cause of pain: not noted Severity: NAS-11 at its worse: 7/10, NAS-11 at its best: 2/10, NAS-11 now: 2/10 and NAS-11 on the average: 2/10 Timing: Afternoon, Night and During activity or exercise Aggravating Factors: Bending, Kneeling, Lifiting, Prolonged standing, Squatting, Twisting and Walking Alleviating Factors: Hot packs, Lying down, Resting, Sitting, Sleeping, Using a brace and Warm showers or baths Associated Problems: Fatigue, Sweating, Swelling, Pain that wakes patient up and Pain that does not allow patient to sleep Quality of Pain: Aching, Annoying, Intermittent, Nagging, Throbbing and Uncomfortable Previous Examinations or Tests: CT scan, Endoscopy, MRI scan, Nerve block, Neurological evaluation and Orthopedic evaluation Previous Treatments: Narcotic medications and Trigger point injections  The patient comes into the clinics today for the first time for a chronic pain management evaluation.  According to the patient his primary area of pain is in his middle to low back.  He denies any previous precipitating factor.  He denies any previous surgery.  He admits that he currently sees orthopedist in [place] and has been diagnosed with bulging disc in the past.  He admits that he had previous injections with Dr. Ernestina Patches which were effective in the beginning but did not as effective.  He denies any recent physical therapy or recent images.  Third area of pain is in his legs.  He admits the left is greater than the right.  He admits that he has occasional pain that goes down the backs of his legs into his calf.  He describes it as being a sharp aching pain.  He admits that he has had nerve conduction studies in the past  by Dr. Jannifer Franklin in [place].  Today I took the time to provide the patient with information regarding this pain practice. The patient was informed that the  practice is divided into two sections: an interventional pain management section, as well as a completely separate and distinct medication management section. I explained that there are procedure days for interventional therapies, and evaluation days for follow-ups and medication management. Because of the amount of documentation required during both, they are kept separated. This means that there is the possibility that he may be scheduled for a procedure on one day, and medication management the next. I have also informed him that because of staffing and facility limitations, this practice will no longer take patients for medication management only. To illustrate the reasons for this, I gave the patient the example of surgeons, and how inappropriate it would be to refer a patient to his/her care, just to write for the post-surgical antibiotics on a surgery done by a different surgeon.   Because interventional pain management is part of the board-certified specialty for the doctors, the patient was informed that joining this practice means that they are open to any and all interventional therapies. I made it clear that this does not mean that they will be forced to have any procedures done. What this means is that I believe interventional therapies to be essential part of the diagnosis and proper management of chronic pain conditions. Therefore, patients not interested in these interventional alternatives will be better served under the care of a different practitioner.  The patient was also made aware of my Comprehensive Pain Management Safety Guidelines where by joining this practice, they limit all of their nerve blocks and joint injections to those done by our practice, for as long as we are retained to manage their care. Historic Controlled Substance Pharmacotherapy Review  PMP and historical list of controlled substances: Tramadol 50 mg, oxycodone 5 mg, lorazepam 1 mg, hydrocodone/acetaminophen 5/325  mg, chlordiazepoxide 25 mg, zolpidem 10 mg, lorazepam 0.5 mg, chlordiazepoxide 10 mg Highest opioid analgesic regimen found: Oxycodone 5 mg 1 tablet every 5 times daily as needed for 7 days (fill date 06/08/2017) oxycodone 25 to 30 mg/day Most recent opioid analgesic: Tramadol 50 mg 1 tablet twice daily (fill date 09/17/2017) tramadol 100 mg/day Current opioid analgesics: Tramadol 50 mg 1 tablet twice daily (fill date 09/17/2017) tramadol 100 mg/day Highest recorded MME/day: 42 mg/day MME/day: 10 mg/day Medications: The patient did not bring the medication(s) to the appointment, as requested in our "New Patient Package" Pharmacodynamics: Desired effects: Analgesia: The patient reports >50% benefit. Reported improvement in function: The patient reports medication allows him to accomplish basic ADLs. Clinically meaningful improvement in function (CMIF): Sustained CMIF goals met Perceived effectiveness: Described as relatively effective, allowing for increase in activities of daily living (ADL) Undesirable effects: Side-effects or Adverse reactions: None reported Historical Monitoring: The patient  reports that he does not use drugs. List of all UDS Test(s): Lab Results  Component Value Date   MDMA NEGATIVE 12/22/2012   COCAINSCRNUR NEGATIVE 12/22/2012   PCPSCRNUR NEGATIVE 12/22/2012   THCU NEGATIVE 12/22/2012   List of all Serum Drug Screening Test(s):  No results found for: AMPHSCRSER, BARBSCRSER, BENZOSCRSER, COCAINSCRSER, PCPSCRSER, PCPQUANT, THCSCRSER, CANNABQUANT, OPIATESCRSER, OXYSCRSER, PROPOXSCRSER Historical Background Evaluation: [place] PDMP: Six (6) year initial data search conducted.             [place] Department of public safety, offender search: Editor, commissioning Information) Non-contributory Risk Assessment Profile: Aberrant behavior: None observed or  detected today Risk factors for fatal opioid overdose: None identified today Fatal overdose hazard ratio (HR): Calculation deferred Non-fatal  overdose hazard ratio (HR): Calculation deferred Risk of opioid abuse or dependence: 0.7-3.0% with doses ? 36 MME/day and 6.1-26% with doses ? 120 MME/day. Substance use disorder (SUD) risk level: Pending results of Medical Psychology Evaluation for SUD Opioid risk tool (ORT) (Total Score): 5  ORT Scoring interpretation table:  Score <3 = Low Risk for SUD  Score between 4-7 = Moderate Risk for SUD  Score >8 = High Risk for Opioid Abuse   PHQ-2 Depression Scale:  Total score: 0  PHQ-2 Scoring interpretation table: (Score and probability of major depressive disorder)  Score 0 = No depression  Score 1 = 15.4% Probability  Score 2 = 21.1% Probability  Score 3 = 38.4% Probability  Score 4 = 45.5% Probability  Score 5 = 56.4% Probability  Score 6 = 78.6% Probability   PHQ-9 Depression Scale:  Total score: 0  PHQ-9 Scoring interpretation table:  Score 0-4 = No depression  Score 5-9 = Mild depression  Score 10-14 = Moderate depression  Score 15-19 = Moderately severe depression  Score 20-27 = Severe depression (2.4 times higher risk of SUD and 2.89 times higher risk of overuse)   Pharmacologic Plan: Pending ordered tests and/or consults  Meds  The patient has a current medication list which includes the following prescription(s): apixaban, b-complex with vitamin c, buspirone, bystolic, doxazosin, folic acid, melatonin, milk thistle, multivitamin, nitroglycerin, praluent, tamsulosin, tramadol, triamcinolone cream, and voltaren.  Current Outpatient Medications on File Prior to Visit  Medication Sig  . apixaban (ELIQUIS) 5 MG TABS tablet Take 5 mg by mouth 2 (two) times daily.  . B Complex-C (B-COMPLEX WITH VITAMIN C) tablet Take 1 tablet by mouth daily.  . busPIRone (BUSPAR) 10 MG tablet Take 1 tablet (10 mg total) by mouth 3 (three) times daily.  Marland Kitchen BYSTOLIC 20 MG TABS TAKE 1 TABLET (20 MG TOTAL) BY MOUTH DAILY.  Marland Kitchen doxazosin (CARDURA) 2 MG tablet Take 1 tablet (2 mg total) by mouth  daily.  Marland Kitchen FOLIC ACID PO Take by mouth daily.  . Melatonin 10 MG TABS Take 1 tablet by mouth at bedtime.  . milk thistle 175 MG tablet Take 175 mg by mouth daily.  . Multiple Vitamin (MULTIVITAMIN) tablet Take 1 tablet by mouth daily. Centrum Plus -Take 1 daily  . nitroGLYCERIN (NITROSTAT) 0.4 MG SL tablet Place 1 tablet (0.4 mg total) under the tongue every 5 (five) minutes as needed for chest pain.  Marland Kitchen PRALUENT 150 MG/ML SOPN   . tamsulosin (FLOMAX) 0.4 MG CAPS capsule Take 0.4 mg by mouth daily.   . traMADol (ULTRAM) 50 MG tablet TAKE ONE TABLET BY MOUTH EVERY 12 HOURS AS NEEDED FOR MODERATE PAIN  . triamcinolone cream (KENALOG) 0.1 % Apply 1 application topically daily as needed.   . VOLTAREN 1 % GEL Apply 2 g topically as needed (knees).    No current facility-administered medications on file prior to visit.    Imaging Review  Cervical Imaging:  Cervical CT wo contrast:  Results for orders placed during the hospital encounter of 01/19/17  CT Cervical Spine Wo Contrast   Narrative CLINICAL DATA:  Trauma.  EXAM: CT HEAD WITHOUT CONTRAST  CT CERVICAL SPINE WITHOUT CONTRAST  TECHNIQUE: Multidetector CT imaging of the head and cervical spine was performed following the standard protocol without intravenous contrast. Multiplanar CT image reconstructions of the cervical spine were also generated.  COMPARISON:  Head CT dated 12/22/2012.  FINDINGS: CT HEAD FINDINGS  Brain: Generalized age related parenchymal atrophy with commensurate dilatation of the ventricles and sulci. Mild chronic small vessel ischemic change within the deep periventricular white matter regions.  No mass, hemorrhage, edema or other evidence of acute parenchymal abnormality. No extra-axial hemorrhage.  Vascular: There are chronic calcified atherosclerotic changes of the large vessels at the skull base. No unexpected hyperdense vessel.  Skull: Normal. Negative for fracture or focal  lesion.  Sinuses/Orbits: Mucosal thickening and/or fluid within the right maxillary sinus, of uncertain age.  Large soft tissue edema/hematoma overlying the left orbit and left lower frontal bone. No underlying fracture seen.  Other: None.  CT CERVICAL SPINE FINDINGS  Alignment: Mild scoliosis which may be accentuated by patient positioning. No evidence of acute vertebral body subluxation.  Skull base and vertebrae: No fracture line or displaced fracture fragment identified. Facet joints appear intact and normally aligned throughout.  Soft tissues and spinal canal: No prevertebral fluid or swelling. No visible canal hematoma.  Disc levels: Disc desiccations within the lower cervical spine, with associated disc space narrowings and mild osseous spurring. No more than mild central canal stenosis at any level.  Upper chest: Negative.  Other: Carotid atherosclerosis.  IMPRESSION: 1. Large soft tissue edema/hematoma overlying the left orbit and lower left frontal bone. No underlying fracture or dislocation seen. Left orbital globe appears grossly intact and normal in configuration. No retro-orbital hemorrhage or edema. 2. No acute intracranial abnormality. No intracranial hemorrhage or edema. No skull fracture. 3. No fracture or acute subluxation within the cervical spine. Scoliosis and mild degenerative change. 4. Carotid atherosclerosis.   Electronically Signed   By: Franki Cabot M.D.   On: 01/19/2017 19:21   Shoulder Imaging:  Results for orders placed during the hospital encounter of 04/27/17  MR Shoulder Left w/o contrast   Narrative CLINICAL DATA:  Left shoulder pain and limited range of motion since March, 2019. No known injury.  EXAM: MRI OF THE LEFT SHOULDER WITHOUT CONTRAST  TECHNIQUE: Multiplanar, multisequence MR imaging of the shoulder was performed. No intravenous contrast was administered.  COMPARISON:  MRI left shoulder  02/16/2007.  FINDINGS: Rotator cuff: There is some heterogeneously increased T2 signal and thickening in the rotator cuff tendons consistent with tendinopathy without tear. Tendinopathy appears worst in the supraspinatus and infraspinatus.  Muscles:  Normal without atrophy or focal lesion.  Biceps long head:  Intact.  Acromioclavicular Joint: The joint has been resected without evidence of complication. Type 1 acromion. Small volume of fluid is seen in the subacromial/subdeltoid bursa.  Glenohumeral Joint: Mild degenerative change is seen.  Labrum: Labral tear extends from approximately the 8 o'clock position of the anterior, inferior labrum across the inferior labrum and into the posterior labrum to approximately the 10 o'clock position. A paralabral cyst off the anterior, inferior labrum is anterior to the neck of the glenoid and measures 0.6 cm AP x 1 cm transverse x 1 cm craniocaudal. Edema in the adjacent subscapularis muscle belly may be due to strain or inflammatory change related to the cyst.  Bones:  No fracture or worrisome lesion.  Other: None.  IMPRESSION: Rotator cuff tendinopathy most notable in the supraspinatus and infraspinatus without tear.  New labral tear extends from the anterior, inferior labrum across the inferior labrum and into the posterior labrum as described above. There is a paralabral cyst off the anterior, inferior labrum. Edema in the adjacent subscapularis muscle belly may be due to leakage  of cystic fluid or strain.  Status post resection of the [facility] joint without evidence of complication.   Electronically Signed   By: Inge Rise M.D.   On: 04/27/2017 15:20   Lumbosacral Imaging: Lumbar MR wo contrast:  Results for orders placed during the hospital encounter of 04/11/17  MR Lumbar Spine w/o contrast   Narrative CLINICAL DATA:  Low back pain for over 2 years.  EXAM: MRI LUMBAR SPINE WITHOUT  CONTRAST  TECHNIQUE: Multiplanar, multisequence MR imaging of the lumbar spine was performed. No intravenous contrast was administered.  COMPARISON:  MRI lumbar spine 10/11/2014.  FINDINGS: Segmentation:  Standard.  Alignment:  Maintained.  Vertebrae: No fracture or worrisome lesion. Marrow signal is mildly heterogeneous with scattered degenerative endplate signal change appearing worst at L3-4.  Conus medullaris and cauda equina: Conus extends to the L1 level. Conus and cauda equina appear normal.  Paraspinal and other soft tissues: Negative.  Disc levels:  T11-12 is imaged in the sagittal plane only. There is a minimal disc bulge but no central canal or foraminal narrowing.  T12-L1: Negative.  L1-2: Negative.  L2-3: Shallow disc bulge with some endplate spurring. There is mild central canal and foraminal narrowing. No nerve root compression. The appearance is not markedly changed.  L3-4: Shallow broad-based disc bulge is somewhat more prominent to the left. There is some narrowing in the left lateral recess. Mild to moderate foraminal narrowing is more notable on the left. Extraforaminal disc contacts the exited L3 roots. The appearance of this level is unchanged.  L4-5: The patient has a shallow disc bulge. More focally protruding disc within and beyond the left foramen is more prominent than on the prior exam and contacts the exiting and exited left L4 root. Moderate bilateral foraminal narrowing is seen. There is mild narrowing in the left lateral recess. Moderate facet arthropathy is noted.  L5-S1: Shallow disc bulge without stenosis.  IMPRESSION: Progressive degenerative disease at L4-5 where a disc bulge eccentrically prominent to the left and more focally protruding disc within and beyond the left foramen has increased in size. Disc contacts the exiting and exited left L4 root. There is also some narrowing in the left lateral recess at this level. The  appearance lumbar spine is otherwise unchanged.  Extraforaminal disc contacts the exited L3 roots bilaterally at L3-4, unchanged.  Mild central canal and bilateral foraminal narrowing at L2-3, unchanged.   Electronically Signed   By: Inge Rise M.D.   On: 04/11/2017 20:33   Knee Imaging:  Knee-R MR wo contrast:  Results for orders placed during the hospital encounter of 12/15/14  MR Knee Right Wo Contrast   Narrative CLINICAL DATA:  Medial right knee pain for 1 year.  EXAM: MRI OF THE RIGHT KNEE WITHOUT CONTRAST  TECHNIQUE: Multiplanar, multisequence MR imaging of the knee was performed. No intravenous contrast was administered.  COMPARISON:  10/16/2009  FINDINGS: MENISCI  Medial meniscus: Oblique tear of the posterior horn of the medial meniscus extending into the body and to the inferior articular surface. 23 mm cystic structure along the periphery of the posterior horn- body junction of the medial meniscus consistent with a parameniscal cyst. Second multiloculated cystic structure measuring 10 mm along the posterior aspect of the posterior horn of the medial meniscus consistent with a parameniscal cyst.  Lateral meniscus: Slight blunting of the free edge of the body of the lateral meniscus consistent with a tiny radial tear.  LIGAMENTS  Cruciates: Intact ACL and PCL. Mild thickening and  increased signal of the ACL as can be seen with mucinous degeneration.  Collaterals: Thickening of the MCL proximally likely chronic without an acute tear. Lateral collateral ligament complex is intact.  CARTILAGE  Patellofemoral: Cartilage irregularity of the lateral patellar facet. Mild partial thickness cartilage loss of the lateral trochlea.  Medial:  No chondral defect.  Lateral:  Mild chondromalacia of the lateral femoral condyle.  Joint: No joint effusion. Mild edema in Hoffa's fat. No plical thickening.  Popliteal Fossa: Small Baker cyst. Severe  tendinosis of the proximal popliteus tendon including the insertion.  Extensor Mechanism:  Intact.  Bones:  No marrow signal abnormality.  No fracture or dislocation.  IMPRESSION: 1. Oblique tear of the posterior horn of the medial meniscus extending into the body and to the inferior articular surface. 23 mm cystic structure along the periphery of the posterior horn- body junction of the medial meniscus consistent with a parameniscal cyst. Second multiloculated cystic structure measuring 10 mm along the posterior aspect of the posterior horn of the medial meniscus consistent with a parameniscal cyst. 2. Severe tendinosis of the popliteus tendon including the insertion. 3. Cartilage irregularity of the lateral patellar facet and mild partial-thickness cartilage loss of the lateral trochlea. 4. Mild mucinous degeneration of the ACL which is otherwise intact.   Electronically Signed   By: Kathreen Devoid   On: 12/15/2014 08:48     Note: Available results from prior imaging studies were reviewed.        ROS  Cardiovascular History: Abnormal heart rhythm, High blood pressure, Heart murmur and Blood thinners:  Anticoagulant Pulmonary or Respiratory History: Smoking, Snoring  and Coughing up mucus (Bronchitis) Neurological History: No reported neurological signs or symptoms such as seizures, abnormal skin sensations, urinary and/or fecal incontinence, being born with an abnormal open spine and/or a tethered spinal cord Review of Past Neurological Studies:  Results for orders placed or performed during the hospital encounter of 01/19/17  CT Head Wo Contrast   Narrative   CLINICAL DATA:  Trauma.  EXAM: CT HEAD WITHOUT CONTRAST  CT CERVICAL SPINE WITHOUT CONTRAST  TECHNIQUE: Multidetector CT imaging of the head and cervical spine was performed following the standard protocol without intravenous contrast. Multiplanar CT image reconstructions of the cervical spine were also  generated.  COMPARISON:  Head CT dated 12/22/2012.  FINDINGS: CT HEAD FINDINGS  Brain: Generalized age related parenchymal atrophy with commensurate dilatation of the ventricles and sulci. Mild chronic small vessel ischemic change within the deep periventricular white matter regions.  No mass, hemorrhage, edema or other evidence of acute parenchymal abnormality. No extra-axial hemorrhage.  Vascular: There are chronic calcified atherosclerotic changes of the large vessels at the skull base. No unexpected hyperdense vessel.  Skull: Normal. Negative for fracture or focal lesion.  Sinuses/Orbits: Mucosal thickening and/or fluid within the right maxillary sinus, of uncertain age.  Large soft tissue edema/hematoma overlying the left orbit and left lower frontal bone. No underlying fracture seen.  Other: None.  CT CERVICAL SPINE FINDINGS  Alignment: Mild scoliosis which may be accentuated by patient positioning. No evidence of acute vertebral body subluxation.  Skull base and vertebrae: No fracture line or displaced fracture fragment identified. Facet joints appear intact and normally aligned throughout.  Soft tissues and spinal canal: No prevertebral fluid or swelling. No visible canal hematoma.  Disc levels: Disc desiccations within the lower cervical spine, with associated disc space narrowings and mild osseous spurring. No more than mild central canal stenosis at any level.  Upper  chest: Negative.  Other: Carotid atherosclerosis.  IMPRESSION: 1. Large soft tissue edema/hematoma overlying the left orbit and lower left frontal bone. No underlying fracture or dislocation seen. Left orbital globe appears grossly intact and normal in configuration. No retro-orbital hemorrhage or edema. 2. No acute intracranial abnormality. No intracranial hemorrhage or edema. No skull fracture. 3. No fracture or acute subluxation within the cervical spine. Scoliosis and mild  degenerative change. 4. Carotid atherosclerosis.   Electronically Signed   By: Franki Cabot M.D.   On: 01/19/2017 19:21   Results for orders placed or performed in visit on 04/11/13  MR Brain W Wo Contrast   Impression     [facility] [address] [place], [place] [address] [phone]  NEUROIMAGING REPORT   STUDY DATE: 04/15/2013 PATIENT NAME: ELBY BLACKWELDER DOB: [birth year] MRN: [id number]  ORDERING CLINICIAN: Dr Jannifer Franklin CLINICAL HISTORY:  45 year patient with confusion episode possible seizure COMPARISON FILMS: MRI Brain 10/03/12 EXAM: MRI Brain w/wo TECHNIQUE: MRI of the brain with and without contrast was obtained utilizing 5 mm axial slices with T1, T2, T2 flair, T2 star gradient echo and diffusion weighted views.  T1 sagittal, T2 coronal and postcontrast views in the axial and coronal plane were obtained.  CONTRAST:  10 ml Gadavist IMAGING SITE: [facility] [place]:  The brain parenchyma shows few scattered nonspecific periventricular subcortical white matter hyperintensities likely due to the changes of chronic microvascular ischemia. This mild degree of generalized cerebral atrophy. There are chronic changes of inflammation noted in paranasal sinuses and mastoid sinuses bilaterally. No structural lesion, tumor or infarction noted.No abnormal lesions are seen on diffusion-weighted views to suggest acute ischemia. The cortical sulci, fissures and cisterns are normal in size and appearance. Lateral, third and fourth ventricle are normal in size and appearance. No extra-axial fluid collections are seen. No evidence of mass effect or midline shift.  No abnormal lesions are seen on post contrast views.  On sagittal views the posterior fossa, pituitary gland and corpus callosum are unremarkable. No evidence of intracranial hemorrhage on gradient-echo views. The orbits and their contents, paranasal sinuses and  calvarium are unremarkable.  Intracranial flow voids are present.      IMPRESSION:  Abnormal MRI scan the brain showing mild changes of chronic microvascular ischemia and generalized cerebral atrophy. There are  incidental changes of mild paranasal sinusitis and mastoiditis. Overall no significant change compared with previous MRI scan dated 10/03/2012    INTERPRETING PHYSICIAN:  Antony Contras, MD Certified in  Neuroimaging by [employer] and [place] [facility] Neurological [facility]specialities    Results for orders placed or performed in visit on 10/05/12  MR Brain Wo Contrast   Impression   [facility]  NEUROIMAGING REPORT   STUDY DATE: 10/03/12 PATIENT NAME: MAKYA YURKO DOB: 31-Jul-1955 MRN: [id number]  ORDERING CLINICIAN: Kathrynn Ducking, MD  CLINICAL HISTORY: 62 year old male with unsteady gait.  EXAM: MRI brain (without)  TECHNIQUE: MRI of the brain without contrast was obtained utilizing 5 mm axial slices with T1, T2, T2 flair, T2 star gradient echo and diffusion weighted views.  T1 sagittal and T2 coronal views were obtained. CONTRAST: no IMAGING SITE: [facility] [address] (1.5 Tesla MRI)   FINDINGS:  No abnormal lesions are seen on diffusion-weighted views to suggest acute ischemia. The cortical sulci, fissures and cisterns are normal in size and appearance. Lateral, third and fourth ventricle are normal in size and appearance.  No extra-axial fluid collections are seen. No evidence of mass effect or midline shift. Few scattered periventricular and subcortical foci of non-specific gliosis.  On sagittal views the posterior fossa, pituitary gland and corpus callosum are unremarkable. No evidence of intracranial hemorrhage on gradient-echo views. The orbits and their contents, paranasal sinuses and calvarium are notable for non-specific fluid/inflammation in the mastoid air cells. Mucosal thickening in the  maxillary, ethmoid, frontal and sphenoid sinuses.  Intracranial flow voids are present.   IMPRESSION:  Mildly abnormal MRI brain (without) demonstrating: 1. Few scattered periventricular and subcortical foci of non-specific gliosis. 2. Non-specific fluid/inflammation in the mastoid air cells. Mucosal thickening in the maxillary, ethmoid, frontal and sphenoid sinuses.  3. No acute findings.    INTERPRETING PHYSICIAN:  Penni Bombard, MD Certified in Neurology, Neurophysiology and Neuroimaging  [facility] [address] [place], [place] [address] [phone]    Psychological-Psychiatric History: Anxiousness and Difficulty sleeping and or falling asleep Gastrointestinal History: Reflux or heatburn Genitourinary History: No reported renal or genitourinary signs or symptoms such as difficulty voiding or producing urine, peeing blood, non-functioning kidney, kidney stones, difficulty emptying the bladder, difficulty controlling the flow of urine, or chronic kidney disease Hematological History: Weakness due to low blood hemoglobin or red blood cell count (Anemia), Brusing easily, Bleeding easily and Low platelet levels (Thrombocytopenia) Endocrine History: No reported endocrine signs or symptoms such as high or low blood sugar, rapid heart rate due to high thyroid levels, obesity or weight gain due to slow thyroid or thyroid disease Rheumatologic History: No reported rheumatological signs and symptoms such as fatigue, joint pain, tenderness, swelling, redness, heat, stiffness, decreased range of motion, with or without associated rash Musculoskeletal History: Negative for myasthenia gravis, muscular dystrophy, multiple sclerosis or malignant hyperthermia Work History: Retired  Allergies  Mr. Nicotra is allergic to clonazepam; crestor [rosuvastatin]; dust mite extract; gabapentin; hydrocodone bitartrate; lipitor [atorvastatin]; lisinopril; and topamax  [topiramate].  Laboratory Chemistry  Inflammation Markers Lab Results  Component Value Date   ESRSEDRATE 38 (H) 03/23/2016   (CRP: Acute Phase) (ESR: Chronic Phase) Renal Function Markers Lab Results  Component Value Date   BUN 12 08/26/2017   CREATININE 0.74 08/26/2017   GFRAA >60 08/26/2017   GFRNONAA >60 08/26/2017   Hepatic Function Markers Lab Results  Component Value Date   AST 75 (H) 09/18/2017   ALT 38 09/18/2017   ALBUMIN 3.7 09/18/2017   ALKPHOS 121 (H) 09/18/2017   Electrolytes Lab Results  Component Value Date   NA 140 08/26/2017   K 4.3 08/26/2017   CL 105 08/26/2017   CALCIUM 9.3 08/26/2017   Neuropathy Markers Lab Results  Component Value Date   VITAMINB12 >2000 (H) 09/18/2017   Bone Pathology Markers Lab Results  Component Value Date   ALKPHOS 121 (H) 09/18/2017   CALCIUM 9.3 08/26/2017   Coagulation Parameters Lab Results  Component Value Date   INR 1.4 (H) 05/18/2017   LABPROT 14.6 (H) 05/18/2017   APTT 40.4 (H) 11/23/2012   PLT 66 (L) 08/26/2017   Cardiovascular Markers Lab Results  Component Value Date   HGB 13.4 08/26/2017   HCT 37.7 (L) 08/26/2017   Note: Lab results reviewed.  [place]  Drug: Mr. Pinder  reports that he does not use drugs. Alcohol:  reports that he drinks about 2.0 standard drinks of alcohol per week. Tobacco:  reports that he has been smoking cigarettes. He has a 2.40 pack-year smoking history. He has never used smokeless tobacco. Medical:  has a past medical history of A-fib ([place]), Abnormality of gait (09/03/2012), Alcohol abuse, Anxiety, Arthritis, Arthritis, Esophageal stricture, GERD (gastroesophageal reflux disease), Gout, Hyperlipidemia, Hypertension, Internal hemorrhoids, Lesion of ulnar nerve (09/03/2012), Murmur, cardiac, PAF (paroxysmal atrial fibrillation) ([place]), Palpitations, Prostate cancer ([place]) (2016), Seizures ([place]), and Tubular adenoma of colon (03/2008). Family: family history includes Diabetes in his  father; Hyperlipidemia in his father; Hypertension in his father and mother; Kidney disease in his father; Lung cancer in his father and mother.  Past Surgical History:  Procedure Laterality Date  . CATARACT EXTRACTION W/PHACO Right 07/03/2014   Procedure: CATARACT EXTRACTION PHACO AND INTRAOCULAR LENS PLACEMENT (IOC);  Surgeon: Lyla Glassing, MD;  Location: [facility] ORS;  Service: Ophthalmology;  Laterality: Right;  lot # [id number] H [place]: 00:32.3 AP% 9.1 CDE: 2.92  . CIRCUMCISION    . COLONOSCOPY    . ELBOW SURGERY    . ELECTROPHYSIOLOGIC STUDY N/A 11/02/2015   Procedure: CARDIOVERSION;  Surgeon: Minna Merritts, MD;  Location: [facility] ORS;  Service: Cardiovascular;  Laterality: N/A;  . ESOPHAGOGASTRODUODENOSCOPY (EGD) WITH PROPOFOL N/A 05/17/2016   Procedure: ESOPHAGOGASTRODUODENOSCOPY (EGD) WITH PROPOFOL;  Surgeon: Lucilla Lame, MD;  Location: [facility] ENDOSCOPY;  Service: Endoscopy;  Laterality: N/A;  . EYE SURGERY    . HAND SURGERY Left   . SHOULDER ARTHROSCOPY Left   . SHOULDER SURGERY     Active Ambulatory Problems    Diagnosis Date Noted  . Essential hypertension 12/16/2009  . Premature beats 12/16/2009  . Hyperlipidemia 01/21/2010  . Tobacco abuse 04/05/2011  . Lesion of ulnar nerve 09/03/2012  . Chronic venous insufficiency 10/12/2012  . History of alcohol abuse 02/04/2015  . Alcohol induced fatty liver 02/04/2015  . Obesity (BMI 30.0-34.9) 02/04/2015  . Back pain 02/04/2015  . History of prostate cancer 02/04/2015  . Aortic atherosclerosis ([place]) 03/20/2015  . CAD (coronary artery disease) 05/06/2015  . Typical atrial flutter ([place]) 09/21/2015  . Anxiety 02/23/2016  . Tremor 03/23/2016  . Paresthesia 03/23/2016  . Sinusitis 06/08/2016  . Syncope 01/19/2017  . Elevated troponin 01/19/2017  . Polypharmacy 01/20/2017  . Thrombocytopenia ([place]) 01/23/2017  . Allergic rhinitis 04/24/2017  . Chronic left shoulder pain 06/12/2017  . Atypical chest pain 09/12/2017  . Chronic  bilateral low back pain with bilateral sciatica (Primary Area of Pain) (L>R) 09/28/2017  . Chronic pain of both lower extremities (Secondary Area of Pain) (L>R) 09/28/2017  . Chronic pain syndrome 09/28/2017  . Long term current use of opiate analgesic 09/28/2017  . Pharmacologic therapy 09/28/2017  . Disorder of skeletal system 09/28/2017  . Problems influencing health status 09/28/2017  . Chronic midline thoracic back pain [facility] Area of Pain) to 09/28/2017   Resolved Ambulatory Problems    Diagnosis Date Noted  . Acute confusional state 03/25/2013  . Preventative health care 02/04/2015  . Leg pain 02/04/2015  . Lower extremity edema 12/30/2015  . Encounter for removal of sutures 01/19/2016  . Bruise 05/04/2016  . Melena   . Respiratory infection 05/24/2016  . Alcohol abuse 01/19/2017   Past Medical History:  Diagnosis Date  . A-fib ([place])   . Abnormality of gait 09/03/2012  . Arthritis   . Arthritis   . Esophageal stricture   . GERD (gastroesophageal reflux disease)   . Gout   . Hypertension   . Internal hemorrhoids   . Murmur, cardiac   . PAF (paroxysmal atrial fibrillation) ([place])   . Palpitations   . Prostate cancer ([place]) 2016  . Seizures ([place])   . Tubular  adenoma of colon 03/2008   Constitutional Exam  General appearance: Well nourished, well developed, and well hydrated. In no apparent acute distress Vitals:   09/28/17 1401  BP: (!) 184/82  Pulse: (!) 59  Resp: 18  Temp: 98.1 F (36.7 C)  TempSrc: Oral  SpO2: 97%  Weight: 238 lb (108 kg)  Height: 6' (1.829 m)   BMI Assessment: Estimated body mass index is 32.28 kg/m as calculated from the following:   Height as of this encounter: 6' (1.829 m).   Weight as of this encounter: 238 lb (108 kg).  BMI interpretation table: BMI level Category Range association with higher incidence of chronic pain  <18 kg/m2 Underweight   18.5-24.9 kg/m2 Ideal body weight   25-29.9 kg/m2 Overweight Increased incidence by  20%  30-34.9 kg/m2 Obese (Class I) Increased incidence by 68%  35-39.9 kg/m2 Severe obesity (Class II) Increased incidence by 136%  >40 kg/m2 Extreme obesity (Class III) Increased incidence by 254%   BMI Readings from Last 4 Encounters:  09/28/17 32.28 kg/m  09/15/17 31.80 kg/m  09/12/17 32.02 kg/m  08/29/17 31.80 kg/m   Wt Readings from Last 4 Encounters:  09/28/17 238 lb (108 kg)  09/15/17 234 lb 8 oz (106.4 kg)  09/12/17 236 lb 2 oz (107.1 kg)  08/29/17 234 lb 8 oz (106.4 kg)  Psych/Mental status: Alert, oriented x 3 (person, place, & time)       Eyes: PERLA Respiratory: No evidence of acute respiratory distress  Cervical Spine Exam  Inspection: No masses, redness, or swelling Alignment: Symmetrical Functional ROM: Unrestricted ROM      Stability: No instability detected Muscle strength & Tone: Functionally intact Sensory: Unimpaired Palpation: No palpable anomalies              Upper Extremity (UE) Exam    Side: Right upper extremity  Side: Left upper extremity  Inspection: No masses, redness, swelling, or asymmetry. No contractures  Inspection: No masses, redness, swelling, or asymmetry. No contractures  Functional ROM: Unrestricted ROM          Functional ROM: Unrestricted ROM          Muscle strength & Tone: Functionally intact  Muscle strength & Tone: Functionally intact  Sensory: Unimpaired  Sensory: Unimpaired  Palpation: No palpable anomalies              Palpation: No palpable anomalies              Specialized Test(s): Deferred         Specialized Test(s): Deferred          Thoracic Spine Exam  Inspection: No masses, redness, or swelling Alignment: Symmetrical Functional ROM: Unrestricted ROM Stability: No instability detected Sensory: Unimpaired Muscle strength & Tone: Complains of area being tender to palpation  Lumbar Spine Exam  Inspection: No masses, redness, or swelling Alignment: Symmetrical Functional ROM: Unrestricted ROM      Stability: No  instability detected Muscle strength & Tone: Functionally intact Sensory: Unimpaired Palpation: Complains of area being tender to palpation       Provocative Tests: Lumbar Hyperextension and rotation test: Positive bilaterally for facet joint pain. Patrick's Maneuver: Negative                    Gait & Posture Assessment  Ambulation: Unassisted Gait: Relatively normal for age and body habitus Posture: WNL   Lower Extremity Exam    Side: Right lower extremity  Side: Left lower extremity  Inspection: No  masses, redness, swelling, or asymmetry. No contractures  Inspection: No masses, redness, swelling, or asymmetry. No contractures  Functional ROM: Unrestricted ROM          Functional ROM: Unrestricted ROM          Muscle strength & Tone: Able to Toe-walk & Heel-walk without problems  Muscle strength & Tone: Able to Toe-walk & Heel-walk without problems  Sensory: Unimpaired  Sensory: Unimpaired  Palpation: No palpable anomalies  Palpation: No palpable anomalies   Assessment  Primary Diagnosis & Pertinent Problem List: Diagnoses of Chronic bilateral low back pain with bilateral sciatica (Primary Area of Pain) (L>R), Chronic midline thoracic back pain (Secondary Area of Pain) to, Chronic pain of both lower extremities (Tertiary Area of Pain) (L>R), Chronic pain syndrome, Long term current use of opiate analgesic, Pharmacologic therapy, Disorder of skeletal system, and Problems influencing health status were pertinent to this visit.  Visit Diagnosis: 1. Chronic bilateral low back pain with bilateral sciatica (Primary Area of Pain) (L>R)   2. Chronic midline thoracic back pain (Secondary Area of Pain) to   3. Chronic pain of both lower extremities (Tertiary Area of Pain) (L>R)   4. Chronic pain syndrome   5. Long term current use of opiate analgesic   6. Pharmacologic therapy   7. Disorder of skeletal system   8. Problems influencing health status    Plan of Care  Initial treatment plan:   Please be advised that as per protocol, today's visit has been an evaluation only. We have not taken over the patient's controlled substance management.  Problem-specific plan: No problem-specific Assessment & Plan notes found for this encounter.  Ordered Lab-work, Procedure(s), Referral(s), & Consult(s): Orders Placed This Encounter  Procedures  . DG Lumbar Spine Complete W/Bend  . Compliance Drug Analysis, Ur  . Comp. Metabolic Panel (12)  . Magnesium  . Vitamin B12  . Sedimentation rate  . 25-Hydroxyvitamin D Lcms D2+D3  . C-reactive protein   Pharmacotherapy: Medications ordered:  No orders of the defined types were placed in this encounter.  Medications administered during this visit: Belenda Cruise A. Burciaga had no medications administered during this visit.   Pharmacotherapy under consideration:  Opioid Analgesics: The patient was informed that there is no guarantee that he would be a candidate for opioid analgesics. The decision will be made following CDC guidelines. This decision will be based on the results of diagnostic studies, as well as Mr. Soderquist's risk profile.  Membrane stabilizer: To be determined at a later time Muscle relaxant: To be determined at a later time NSAID: To be determined at a later time Other analgesic(s): To be determined at a later time   Interventional therapies under consideration: Mr. Isakson was informed that there is no guarantee that he would be a candidate for interventional therapies. The decision will be based on the results of diagnostic studies, as well as Mr. Monnin's risk profile.  Possible procedure(s): Diagnostic trigger point injection Diagnostic bilateral thoracic facet nerve block Diagnostic bilateral lumbar epidural steroid injection Diagnostic bilateral lumbar facet nerve block Possible bilateral lumbar facet radiofrequency ablation Possible bilateral thoracic facet radiofrequency ablation   Provider-requested follow-up: Return  for 2nd Visit, w/ Dr. Dossie Arbour.  Future Appointments  Date Time Provider [place]  10/18/2017 10:30 AM Milinda Pointer, MD [facility] None    Primary Care Physician: [name], MD Location: [facility] Note by:  Date: 09/28/2017; Time: 3:29 PM  Pain Score Disclaimer: We use the NRS-11 scale. This  is a self-reported, subjective measurement of pain severity with only modest accuracy. It is used primarily to identify changes within a particular patient. It must be understood that outpatient pain scales are significantly less accurate that those used for research, where they can be applied under ideal controlled circumstances with minimal exposure to variables. In reality, the score is likely to be a combination of pain intensity and pain affect, where pain affect describes the degree of emotional arousal or changes in action readiness caused by the sensory experience of pain. Factors such as social and work situation, setting, emotional state, anxiety levels, expectation, and prior pain experience may influence pain perception and show large inter-individual differences that may also be affected by time variables.  Patient instructions provided during this appointment: Patient Instructions   You will complete blood and urine labs and xrays prior to next appt. with pain clinic.____________________________________________________________________________________________  Appointment Policy Summary  It is our goal and responsibility to provide the medical community with assistance in the evaluation and management of patients with chronic pain. Unfortunately our resources are limited. Because we do not have an unlimited amount of time, or available appointments, we are required to closely monitor and manage their use. The following rules exist to maximize their use:  Patient's responsibilities: 1. Punctuality:  At what time should I arrive? You should  be physically present in our office 30 minutes before your scheduled appointment. Your scheduled appointment is with your assigned healthcare provider. However, it takes 5-10 minutes to be "checked-in", and another 15 minutes for the nurses to do the admission. If you arrive to our office at the time you were given for your appointment, you will end up being at least 20-25 minutes late to your appointment with the provider. 2. Tardiness:  What happens if I arrive only a few minutes after my scheduled appointment time? You will need to reschedule your appointment. The cutoff is your appointment time. This is why it is so important that you arrive at least 30 minutes before that appointment. If you have an appointment scheduled for 10:00 AM and you arrive at 10:01, you will be required to reschedule your appointment.  3. Plan ahead:  Always assume that you will encounter traffic on your way in. Plan for it. If you are dependent on a driver, make sure they understand these rules and the need to arrive early. 4. Other appointments and responsibilities:  Avoid scheduling any other appointments before or after your pain clinic appointments.  5. Be prepared:  Write down everything that you need to discuss with your healthcare provider and give this information to the admitting nurse. Write down the medications that you will need refilled. Bring your pills and bottles (even the empty ones), to all of your appointments, except for those where a procedure is scheduled. 6. No children or pets:  Find someone to take care of them. It is not appropriate to bring them in. 7. Scheduling changes:  We request "advanced notification" of any changes or cancellations. 8. Advanced notification:  Defined as a time period of more than 24 hours prior to the originally scheduled appointment. This allows for the appointment to be offered to other patients. 9. Rescheduling:  When a visit is rescheduled, it will require the  cancellation of the original appointment. For this reason they both fall within the category of "Cancellations".  10. Cancellations:  They require advanced notification. Any cancellation less than 24 hours before the  appointment will be recorded as a "  No Show". 11. No Show:  Defined as an unkept appointment where the patient failed to notify or declare to the practice their intention or inability to keep the appointment.  Corrective process for repeat offenders:  1. Tardiness: Three (3) episodes of rescheduling due to late arrivals will be recorded as one (1) "No Show". 2. Cancellation or reschedule: Three (3) cancellations or rescheduling will be recorded as one (1) "No Show". 3. "No Shows": Three (3) "No Shows" within a 12 month period will result in discharge from the practice. ____________________________________________________________________________________________  ____________________________________________________________________________________________  Pain Scale  Introduction: The pain score used by this practice is the Verbal Numerical Rating Scale (VNRS-11). This is an 11-point scale. It is for adults and children 10 years or older. There are significant differences in how the pain score is reported, used, and applied. Forget everything you learned in the past and learn this scoring system.  General Information: The scale should reflect your current level of pain. Unless you are specifically asked for the level of your worst pain, or your average pain. If you are asked for one of these two, then it should be understood that it is over the past 24 hours.  Basic Activities of Daily Living (ADL): Personal hygiene, dressing, eating, transferring, and using restroom.  Instructions: Most patients tend to report their level of pain as a combination of two factors, their physical pain and their psychosocial pain. This last one is also known as "suffering" and it is reflection of how  physical pain affects you socially and psychologically. From now on, report them separately. From this point on, when asked to report your pain level, report only your physical pain. Use the following table for reference.  Pain Clinic Pain Levels (0-5/10)  Pain Level Score  Description  No Pain 0   Mild pain 1 Nagging, annoying, but does not interfere with basic activities of daily living (ADL). Patients are able to eat, bathe, get dressed, toileting (being able to get on and off the toilet and perform personal hygiene functions), transfer (move in and out of bed or a chair without assistance), and maintain continence (able to control bladder and bowel functions). Blood pressure and heart rate are unaffected. A normal heart rate for a healthy adult ranges from 60 to 100 bpm (beats per minute).   Mild to moderate pain 2 Noticeable and distracting. Impossible to hide from other people. More frequent flare-ups. Still possible to adapt and function close to normal. It can be very annoying and may have occasional stronger flare-ups. With discipline, patients may get used to it and adapt.   Moderate pain 3 Interferes significantly with activities of daily living (ADL). It becomes difficult to feed, bathe, get dressed, get on and off the toilet or to perform personal hygiene functions. Difficult to get in and out of bed or a chair without assistance. Very distracting. With effort, it can be ignored when deeply involved in activities.   Moderately severe pain 4 Impossible to ignore for more than a few minutes. With effort, patients may still be able to manage work or participate in some social activities. Very difficult to concentrate. Signs of autonomic nervous system discharge are evident: dilated pupils (mydriasis); mild sweating (diaphoresis); sleep interference. Heart rate becomes elevated (>115 bpm). Diastolic blood pressure (lower number) rises above 100 mmHg. Patients find relief in laying down and not  moving.   Severe pain 5 Intense and extremely unpleasant. Associated with frowning face and frequent crying. Pain overwhelms the  senses.  Ability to do any activity or maintain social relationships becomes significantly limited. Conversation becomes difficult. Pacing back and forth is common, as getting into a comfortable position is nearly impossible. Pain wakes you up from deep sleep. Physical signs will be obvious: pupillary dilation; increased sweating; goosebumps; brisk reflexes; cold, clammy hands and feet; nausea, vomiting or dry heaves; loss of appetite; significant sleep disturbance with inability to fall asleep or to remain asleep. When persistent, significant weight loss is observed due to the complete loss of appetite and sleep deprivation.  Blood pressure and heart rate becomes significantly elevated. Caution: If elevated blood pressure triggers a pounding headache associated with blurred vision, then the patient should immediately seek attention at an urgent or emergency care unit, as these may be signs of an impending stroke.    Emergency Department Pain Levels (6-10/10)  Emergency Room Pain 6 Severely limiting. Requires emergency care and should not be seen or managed at an outpatient pain management facility. Communication becomes difficult and requires great effort. Assistance to reach the emergency department may be required. Facial flushing and profuse sweating along with potentially dangerous increases in heart rate and blood pressure will be evident.   Distressing pain 7 Self-care is very difficult. Assistance is required to transport, or use restroom. Assistance to reach the emergency department will be required. Tasks requiring coordination, such as bathing and getting dressed become very difficult.   Disabling pain 8 Self-care is no longer possible. At this level, pain is disabling. The individual is unable to do even the most "basic" activities such as walking, eating, bathing,  dressing, transferring to a bed, or toileting. Fine motor skills are lost. It is difficult to think clearly.   Incapacitating pain 9 Pain becomes incapacitating. Thought processing is no longer possible. Difficult to remember your own name. Control of movement and coordination are lost.   The worst pain imaginable 10 At this level, most patients pass out from pain. When this level is reached, collapse of the autonomic nervous system occurs, leading to a sudden drop in blood pressure and heart rate. This in turn results in a temporary and dramatic drop in blood flow to the brain, leading to a loss of consciousness. Fainting is one of the body's self defense mechanisms. Passing out puts the brain in a calmed state and causes it to shut down for a while, in order to begin the healing process.    Summary: 1. Refer to this scale when providing [place] with your pain level. 2. Be accurate and careful when reporting your pain level. This will help with your care. 3. Over-reporting your pain level will lead to loss of credibility. 4. Even a level of 1/10 means that there is pain and will be treated at our facility. 5. High, inaccurate reporting will be documented as "Symptom Exaggeration", leading to loss of credibility and suspicions of possible secondary gains such as obtaining more narcotics, or wanting to appear disabled, for fraudulent reasons. 6. Only pain levels of 5 or below will be seen at our facility. 7. Pain levels of 6 and above will be sent to the Emergency Department and the appointment cancelled. ____________________________________________________________________________________________   BMI Assessment: Estimated body mass index is 32.28 kg/m as calculated from the following:   Height as of this encounter: 6' (1.829 m).   Weight as of this encounter: 238 lb (108 kg).  BMI interpretation table: BMI level Category Range association with higher incidence of chronic pain  <18 kg/m2  Underweight  18.5-24.9 kg/m2 Ideal body weight   25-29.9 kg/m2 Overweight Increased incidence by 20%  30-34.9 kg/m2 Obese (Class I) Increased incidence by 68%  35-39.9 kg/m2 Severe obesity (Class II) Increased incidence by 136%  >40 kg/m2 Extreme obesity (Class III) Increased incidence by 254%   BMI Readings from Last 4 Encounters:  09/28/17 32.28 kg/m  09/15/17 31.80 kg/m  09/12/17 32.02 kg/m  08/29/17 31.80 kg/m   Wt Readings from Last 4 Encounters:  09/28/17 238 lb (108 kg)  09/15/17 234 lb 8 oz (106.4 kg)  09/12/17 236 lb 2 oz (107.1 kg)  08/29/17 234 lb 8 oz (106.4 kg)

## 2017-09-28 NOTE — Patient Instructions (Addendum)
You will complete blood and urine labs and xrays prior to next appt. with pain clinic.____________________________________________________________________________________________  Appointment Policy Summary  It is our goal and responsibility to provide the medical community with assistance in the evaluation and management of patients with chronic pain. Unfortunately our resources are limited. Because we do not have an unlimited amount of time, or available appointments, we are required to closely monitor and manage their use. The following rules exist to maximize their use:  Patient's responsibilities: 1. Punctuality:  At what time should I arrive? You should be physically present in our office 30 minutes before your scheduled appointment. Your scheduled appointment is with your assigned healthcare provider. However, it takes 5-10 minutes to be "checked-in", and another 15 minutes for the nurses to do the admission. If you arrive to our office at the time you were given for your appointment, you will end up being at least 20-25 minutes late to your appointment with the provider. 2. Tardiness:  What happens if I arrive only a few minutes after my scheduled appointment time? You will need to reschedule your appointment. The cutoff is your appointment time. This is why it is so important that you arrive at least 30 minutes before that appointment. If you have an appointment scheduled for 10:00 AM and you arrive at 10:01, you will be required to reschedule your appointment.  3. Plan ahead:  Always assume that you will encounter traffic on your way in. Plan for it. If you are dependent on a driver, make sure they understand these rules and the need to arrive early. 4. Other appointments and responsibilities:  Avoid scheduling any other appointments before or after your pain clinic appointments.  5. Be prepared:  Write down everything that you need to discuss with your healthcare provider and give this  information to the admitting nurse. Write down the medications that you will need refilled. Bring your pills and bottles (even the empty ones), to all of your appointments, except for those where a procedure is scheduled. 6. No children or pets:  Find someone to take care of them. It is not appropriate to bring them in. 7. Scheduling changes:  We request "advanced notification" of any changes or cancellations. 8. Advanced notification:  Defined as a time period of more than 24 hours prior to the originally scheduled appointment. This allows for the appointment to be offered to other patients. 9. Rescheduling:  When a visit is rescheduled, it will require the cancellation of the original appointment. For this reason they both fall within the category of "Cancellations".  10. Cancellations:  They require advanced notification. Any cancellation less than 24 hours before the  appointment will be recorded as a "No Show". 11. No Show:  Defined as an unkept appointment where the patient failed to notify or declare to the practice their intention or inability to keep the appointment.  Corrective process for repeat offenders:  1. Tardiness: Three (3) episodes of rescheduling due to late arrivals will be recorded as one (1) "No Show". 2. Cancellation or reschedule: Three (3) cancellations or rescheduling will be recorded as one (1) "No Show". 3. "No Shows": Three (3) "No Shows" within a 12 month period will result in discharge from the practice. ____________________________________________________________________________________________  ____________________________________________________________________________________________  Pain Scale  Introduction: The pain score used by this practice is the Verbal Numerical Rating Scale (VNRS-11). This is an 11-point scale. It is for adults and children 10 years or older. There are significant differences in how the pain  score is reported, used, and applied.  Forget everything you learned in the past and learn this scoring system.  General Information: The scale should reflect your current level of pain. Unless you are specifically asked for the level of your worst pain, or your average pain. If you are asked for one of these two, then it should be understood that it is over the past 24 hours.  Basic Activities of Daily Living (ADL): Personal hygiene, dressing, eating, transferring, and using restroom.  Instructions: Most patients tend to report their level of pain as a combination of two factors, their physical pain and their psychosocial pain. This last one is also known as "suffering" and it is reflection of how physical pain affects you socially and psychologically. From now on, report them separately. From this point on, when asked to report your pain level, report only your physical pain. Use the following table for reference.  Pain Clinic Pain Levels (0-5/10)  Pain Level Score  Description  No Pain 0   Mild pain 1 Nagging, annoying, but does not interfere with basic activities of daily living (ADL). Patients are able to eat, bathe, get dressed, toileting (being able to get on and off the toilet and perform personal hygiene functions), transfer (move in and out of bed or a chair without assistance), and maintain continence (able to control bladder and bowel functions). Blood pressure and heart rate are unaffected. A normal heart rate for a healthy adult ranges from 60 to 100 bpm (beats per minute).   Mild to moderate pain 2 Noticeable and distracting. Impossible to hide from other people. More frequent flare-ups. Still possible to adapt and function close to normal. It can be very annoying and may have occasional stronger flare-ups. With discipline, patients may get used to it and adapt.   Moderate pain 3 Interferes significantly with activities of daily living (ADL). It becomes difficult to feed, bathe, get dressed, get on and off the toilet or to  perform personal hygiene functions. Difficult to get in and out of bed or a chair without assistance. Very distracting. With effort, it can be ignored when deeply involved in activities.   Moderately severe pain 4 Impossible to ignore for more than a few minutes. With effort, patients may still be able to manage work or participate in some social activities. Very difficult to concentrate. Signs of autonomic nervous system discharge are evident: dilated pupils (mydriasis); mild sweating (diaphoresis); sleep interference. Heart rate becomes elevated (>115 bpm). Diastolic blood pressure (lower number) rises above 100 mmHg. Patients find relief in laying down and not moving.   Severe pain 5 Intense and extremely unpleasant. Associated with frowning face and frequent crying. Pain overwhelms the senses.  Ability to do any activity or maintain social relationships becomes significantly limited. Conversation becomes difficult. Pacing back and forth is common, as getting into a comfortable position is nearly impossible. Pain wakes you up from deep sleep. Physical signs will be obvious: pupillary dilation; increased sweating; goosebumps; brisk reflexes; cold, clammy hands and feet; nausea, vomiting or dry heaves; loss of appetite; significant sleep disturbance with inability to fall asleep or to remain asleep. When persistent, significant weight loss is observed due to the complete loss of appetite and sleep deprivation.  Blood pressure and heart rate becomes significantly elevated. Caution: If elevated blood pressure triggers a pounding headache associated with blurred vision, then the patient should immediately seek attention at an urgent or emergency care unit, as these may be signs of an impending  stroke.    Emergency Department Pain Levels (6-10/10)  Emergency Room Pain 6 Severely limiting. Requires emergency care and should not be seen or managed at an outpatient pain management facility. Communication becomes  difficult and requires great effort. Assistance to reach the emergency department may be required. Facial flushing and profuse sweating along with potentially dangerous increases in heart rate and blood pressure will be evident.   Distressing pain 7 Self-care is very difficult. Assistance is required to transport, or use restroom. Assistance to reach the emergency department will be required. Tasks requiring coordination, such as bathing and getting dressed become very difficult.   Disabling pain 8 Self-care is no longer possible. At this level, pain is disabling. The individual is unable to do even the most "basic" activities such as walking, eating, bathing, dressing, transferring to a bed, or toileting. Fine motor skills are lost. It is difficult to think clearly.   Incapacitating pain 9 Pain becomes incapacitating. Thought processing is no longer possible. Difficult to remember your own name. Control of movement and coordination are lost.   The worst pain imaginable 10 At this level, most patients pass out from pain. When this level is reached, collapse of the autonomic nervous system occurs, leading to a sudden drop in blood pressure and heart rate. This in turn results in a temporary and dramatic drop in blood flow to the brain, leading to a loss of consciousness. Fainting is one of the body's self defense mechanisms. Passing out puts the brain in a calmed state and causes it to shut down for a while, in order to begin the healing process.    Summary: 1. Refer to this scale when providing [place] with your pain level. 2. Be accurate and careful when reporting your pain level. This will help with your care. 3. Over-reporting your pain level will lead to loss of credibility. 4. Even a level of 1/10 means that there is pain and will be treated at our facility. 5. High, inaccurate reporting will be documented as "Symptom Exaggeration", leading to loss of credibility and suspicions of possible secondary  gains such as obtaining more narcotics, or wanting to appear disabled, for fraudulent reasons. 6. Only pain levels of 5 or below will be seen at our facility. 7. Pain levels of 6 and above will be sent to the Emergency Department and the appointment cancelled. ____________________________________________________________________________________________   BMI Assessment: Estimated body mass index is 32.28 kg/m as calculated from the following:   Height as of this encounter: 6' (1.829 m).   Weight as of this encounter: 238 lb (108 kg).  BMI interpretation table: BMI level Category Range association with higher incidence of chronic pain  <18 kg/m2 Underweight   18.5-24.9 kg/m2 Ideal body weight   25-29.9 kg/m2 Overweight Increased incidence by 20%  30-34.9 kg/m2 Obese (Class I) Increased incidence by 68%  35-39.9 kg/m2 Severe obesity (Class II) Increased incidence by 136%  >40 kg/m2 Extreme obesity (Class III) Increased incidence by 254%   BMI Readings from Last 4 Encounters:  09/28/17 32.28 kg/m  09/15/17 31.80 kg/m  09/12/17 32.02 kg/m  08/29/17 31.80 kg/m   Wt Readings from Last 4 Encounters:  09/28/17 238 lb (108 kg)  09/15/17 234 lb 8 oz (106.4 kg)  09/12/17 236 lb 2 oz (107.1 kg)  08/29/17 234 lb 8 oz (106.4 kg)

## 2017-09-29 DIAGNOSIS — J301 Allergic rhinitis due to pollen: Secondary | ICD-10-CM | POA: Diagnosis not present

## 2017-09-29 NOTE — Telephone Encounter (Signed)
The B12 and folate were both above the normal levels and the patient should back off on them if he is continuing to take them.

## 2017-10-02 ENCOUNTER — Ambulatory Visit
Admission: RE | Admit: 2017-10-02 | Discharge: 2017-10-02 | Disposition: A | Discharge status: Home / Self Care | Payer: 59 | Source: Ambulatory Visit | Attending: Nurse Practitioner | Admitting: Nurse Practitioner

## 2017-10-02 DIAGNOSIS — M5442 Lumbago with sciatica, left side: Secondary | ICD-10-CM | POA: Diagnosis not present

## 2017-10-02 DIAGNOSIS — G8929 Other chronic pain: Secondary | ICD-10-CM | POA: Insufficient documentation

## 2017-10-02 DIAGNOSIS — M47816 Spondylosis without myelopathy or radiculopathy, lumbar region: Secondary | ICD-10-CM | POA: Insufficient documentation

## 2017-10-02 DIAGNOSIS — M5441 Lumbago with sciatica, right side: Secondary | ICD-10-CM | POA: Diagnosis not present

## 2017-10-02 DIAGNOSIS — M899 Disorder of bone, unspecified: Secondary | ICD-10-CM | POA: Diagnosis not present

## 2017-10-02 DIAGNOSIS — Z789 Other specified health status: Secondary | ICD-10-CM | POA: Diagnosis not present

## 2017-10-02 DIAGNOSIS — Z79899 Other long term (current) drug therapy: Secondary | ICD-10-CM | POA: Diagnosis not present

## 2017-10-03 NOTE — Progress Notes (Signed)
Results were reviewed and found to be: abnormal  No acute injury or pathology identified  Review would suggest interventional pain management techniques may be of benefit

## 2017-10-04 LAB — COMPLIANCE DRUG ANALYSIS, UR

## 2017-10-06 LAB — COMP. METABOLIC PANEL (12)
A/G RATIO: 1.2 (ref 1.2–2.2)
AST: 52 IU/L — AB (ref 0–40)
Albumin: 3.7 g/dL (ref 3.6–4.8)
Alkaline Phosphatase: 149 IU/L — ABNORMAL HIGH (ref 39–117)
BILIRUBIN TOTAL: 1.7 mg/dL — AB (ref 0.0–1.2)
BUN/Creatinine Ratio: 20 (ref 10–24)
BUN: 15 mg/dL (ref 8–27)
CALCIUM: 9 mg/dL (ref 8.6–10.2)
CHLORIDE: 100 mmol/L (ref 96–106)
Creatinine, Ser: 0.75 mg/dL — ABNORMAL LOW (ref 0.76–1.27)
GFR calc non Af Amer: 99 mL/min/{1.73_m2} (ref >59)
GFR, EST AFRICAN AMERICAN: 114 mL/min/{1.73_m2} (ref >59)
Globulin, Total: 3.1 g/dL (ref 1.5–4.5)
Glucose: 129 mg/dL — ABNORMAL HIGH (ref 65–99)
POTASSIUM: 4.3 mmol/L (ref 3.5–5.2)
Sodium: 140 mmol/L (ref 134–144)
Total Protein: 6.8 g/dL (ref 6.0–8.5)

## 2017-10-06 LAB — SEDIMENTATION RATE: SED RATE: 13 mm/h (ref 0–30)

## 2017-10-06 LAB — 25-HYDROXY VITAMIN D LCMS D2+D3
25-Hydroxy, Vitamin D-2: <1 ng/mL
25-Hydroxy, Vitamin D-3: 44 ng/mL
25-Hydroxy, Vitamin D: 44 ng/mL

## 2017-10-06 LAB — C-REACTIVE PROTEIN: CRP: 5 mg/L (ref 0–10)

## 2017-10-06 LAB — MAGNESIUM: MAGNESIUM: 1.3 mg/dL — AB (ref 1.6–2.3)

## 2017-10-06 LAB — VITAMIN B12: Vitamin B-12: >2000 pg/mL — ABNORMAL HIGH (ref 232–1245)

## 2017-10-11 DIAGNOSIS — J301 Allergic rhinitis due to pollen: Secondary | ICD-10-CM | POA: Diagnosis not present

## 2017-10-12 LAB — B12 AND FOLATE PANEL

## 2017-10-12 LAB — SPECIMEN STATUS REPORT

## 2017-10-17 NOTE — Progress Notes (Signed)
Patient's Name: Peter [name]  MRN: [id number]  Referring Provider: [name], MD  DOB: 1955-09-10  PCP: [name], MD  DOS: 10/18/2017  Note by: Gaspar Cola, MD  Service setting: Ambulatory outpatient  Specialty: Interventional Pain Management  Location: [facility] (AMB) Pain Management Facility    Patient type: Established   Primary Reason(s) for Visit: Encounter for evaluation before starting new chronic pain management plan of care (Level of risk: moderate) CC: Back Pain  HPI  Peter [name] is a 62 y.o. year old, male patient, who comes today for a follow-up evaluation to review the test results and decide on a treatment plan. He has Essential hypertension; Premature beats; Hyperlipidemia; Tobacco abuse; Lesion of ulnar nerve; Chronic venous insufficiency; History of alcohol abuse; Alcohol induced fatty liver; Obesity (BMI 30.0-34.9); History of prostate cancer; Aortic atherosclerosis ([place]); CAD (coronary artery disease); Typical atrial flutter ([place]); Anxiety; Tremor; Paresthesia; Sinusitis; Syncope; Elevated troponin; Polypharmacy; Thrombocytopenia ([place]); Allergic rhinitis; Chronic shoulder pain (Left); Atypical chest pain; Chronic low back pain (Primary Area of Pain) (Bilateral) (L>R) w/ sciatica (Bilateral); Chronic lower extremity pain (Secondary Area of Pain) (Bilateral) (L>R); Chronic pain syndrome; Long term current use of opiate analgesic; Pharmacologic therapy; Disorder of skeletal system; Problems influencing health status; Chronic thoracic back pain [facility] Area of Pain) (Midline); Rheumatoid factor positive; Hypomagnesemia; Chronic anticoagulation (ELIQUIS); Decreased shoulder range of motion (ROM) (Left); Tendinopathy of rotator cuff (Left); Labral tear of shoulder, sequela (Left); Strain of subscapularis muscle, sequela (Left); Chronic knee pain (Right); Arthropathy of knee (Right); Enthesopathy of knee region (Right); Osteoarthritis of knee (Right); Lumbar spondylosis;  DDD (degenerative disc disease), lumbar; Lumbar facet arthropathy; Lumbar facet syndrome; Lumbar lateral recess stenosis (Left: L3-4, L4-5); Lumbar central spinal stenosis (L2-3); Lumbar foraminal stenosis; and History of esophageal stricture on their problem list. His primarily concern today is the Back Pain  Pain Assessment: Location: Lower Back Radiating: Deni Onset: More than a month ago Duration: Chronic pain Quality: Aching, Throbbing, Constant Severity: 2 /10 (subjective, self-reported pain score)  Note: Reported level is compatible with observation.                         When using our objective Pain Scale, levels between 6 and 10/10 are said to belong in an emergency room, as it progressively worsens from a 6/10, described as severely limiting, requiring emergency care not usually available at an outpatient pain management facility. At a 6/10 level, communication becomes difficult and requires great effort. Assistance to reach the emergency department may be required. Facial flushing and profuse sweating along with potentially dangerous increases in heart rate and blood pressure will be evident. Effect on ADL: limits my daily activities Timing: Constant Modifying factors: tramadol 50 mg BP: (!) 180/76  HR: 64  Peter [name] comes in today for a follow-up visit after his initial evaluation on 09/28/2017. Today we went over the results of his tests. These were explained in "Layman's terms". During today's appointment we went over my diagnostic impression, as well as the proposed treatment plan.  According to the patient his primary area of pain is in his middle to low back (Midline).  He denies any previous precipitating factor.  He denies any previous surgery.  He admits that he currently sees orthopedist in [place] [place] (Dr. Durward Fortes) and has been diagnosed with bulging disc in the past.  He admits that he had previous injections w/ fluoro (about 10) with Dr. Ernestina Patches  which  were effective in the beginning but did not as effective. He denies any recent physical therapy or recent images.  Second area of pain is in his legs (B) (L>R).  He admits the left is greater than the right.  He admits that he has occasional pain that goes down the backs of his legs into his calf.  He describes it as being a sharp aching pain.  He admits that he has had nerve conduction studies in the past by Dr. Jannifer Franklin (Neurology) ([facility]) in [place]. He prescribed Buspar for tremors.  In considering the treatment plan options, Peter [name] was reminded that I no longer take patients for medication management only. I asked him to let me know if he had no intention of taking advantage of the interventional therapies, so that we could make arrangements to provide this space to someone interested. I also made it clear that undergoing interventional therapies for the purpose of getting pain medications is very inappropriate on the part of a patient, and it will not be tolerated in this practice. This type of behavior would suggest true addiction and therefore it requires referral to an addiction specialist.   Further details on both, my assessment(s), as well as the proposed treatment plan, please see below.  Controlled Substance Pharmacotherapy Assessment REMS (Risk Evaluation and Mitigation Strategy)  Analgesic: Tramadol 50 mg 1 tablet twice daily (fill date 09/17/2017) tramadol 100 mg/day Highest recorded MME/day: 42 mg/day MME/day: 10 mg/day Pill Count: None expected due to no prior prescriptions written by our practice. No notes on file Pharmacokinetics: Liberation and absorption (onset of action): WNL Distribution (time to peak effect): WNL Metabolism and excretion (duration of action): WNL         Pharmacodynamics: Desired effects: Analgesia: Peter [name] reports >50% benefit. Functional ability: Patient reports that medication allows him to accomplish basic ADLs Clinically  meaningful improvement in function (CMIF): Sustained CMIF goals met Perceived effectiveness: Described as relatively effective, allowing for increase in activities of daily living (ADL) Undesirable effects: Side-effects or Adverse reactions: None reported Monitoring: [place] PMP: Online review of the past 32-monthperiod previously conducted. Not applicable at this point since we have not taken over the patient's medication management yet. List of other Serum/Urine Drug Screening Test(s):  Lab Results  Component Value Date   COCAINSCRNUR NEGATIVE 12/22/2012   TJennings LodgeNEGATIVE 12/22/2012   List of all UDS test(s) done:  Lab Results  Component Value Date   SUMMARY FINAL 09/28/2017   Last UDS on record: Summary  Date Value Ref Range Status  09/28/2017 FINAL  Final    Comment:    ==================================================================== TOXASSURE COMP DRUG ANALYSIS,UR ==================================================================== Test                             Result       Flag       Units Drug Present and Declared for Prescription Verification   Tramadol                       2855         EXPECTED   ng/mg creat   O-Desmethyltramadol            1242         EXPECTED   ng/mg creat   N-Desmethyltramadol            1138         EXPECTED   ng/mg  creat    Source of tramadol is a prescription medication.    O-desmethyltramadol and N-desmethyltramadol are expected    metabolites of tramadol. Drug Present not Declared for Prescription Verification   Alcohol, Ethyl                 0.075        UNEXPECTED g/dL    Sources of ethyl alcohol include alcoholic beverages or as a    fermentation product of glucose; glucose was not detected in this    specimen. Ethyl alcohol result should be interpreted in the    context of all available clinical and behavioral information. Drug Absent but Declared for Prescription Verification   Diclofenac                     Not Detected UNEXPECTED     Diclofenac, as indicated in the declared medication list, is not    always detected even when used as directed. ==================================================================== Test                      Result    Flag   Units      Ref Range   Creatinine              66               mg/dL      >=20 ==================================================================== Declared Medications:  The flagging and interpretation on this report are based on the  following declared medications.  Unexpected results may arise from  inaccuracies in the declared medications.  **Note: The testing scope of this panel includes these medications:  Tramadol (Ultram)  **Note: The testing scope of this panel does not include small to  moderate amounts of these reported medications:  Diclofenac (Voltaren)  **Note: The testing scope of this panel does not include following  reported medications:  Alirocumab  Apixaban  Buspirone (BuSpar)  Doxazosin (Cardura)  Melatonin  Multivitamin  Nebivolol (Bystolic)  Nitroglycerin (Nitrostat)  Supplement (MilkThistle)  Tamsulosin (Flomax)  Triamcinolone (Kenalog)  Vitamin B  Vitamin C ==================================================================== For clinical consultation, please call [phone]. ====================================================================    UDS interpretation: Unexpected findings not considered significantly abnormal.          Medication Assessment Form: Patient introduced to form today Treatment compliance: Treatment may start today if patient agrees with proposed plan. Evaluation of compliance is not applicable at this point Risk Assessment Profile: Aberrant behavior: See initial evaluations. None observed or detected today Comorbid factors increasing risk of overdose: See initial evaluation. No additional risks detected today Opioid risk tool (ORT) (Total Score): 3 Personal History of Substance Abuse (SUD-Substance  use disorder):  Alcohol: Alcohol: Negative  Illegal Drugs: Illegal Drugs: Negative  Rx Drugs: Rx Drugs: Negative  ORT Risk Level calculation: Opioid Risk Interpretation: Low Risk Risk of substance use disorder (SUD): Low Opioid Risk Tool - 10/18/17 1017      Family History of Substance Abuse   Alcohol  Positive Male    Illegal Drugs  Negative    Rx Drugs  Negative      Personal History of Substance Abuse   Alcohol  Negative    Illegal Drugs  Negative    Rx Drugs  Negative      Age   Age between 27-45 years   No      History of Preadolescent Sexual Abuse   History of Preadolescent Sexual Abuse  Negative or Male  Psychological Disease   Psychological Disease  Positive    ADD  Negative    OCD  Negative    Bipolar  Negative    Schizophrenia  Negative    Depression  Negative      Total Score   Opioid Risk Tool Scoring  3    Opioid Risk Interpretation  Low Risk      ORT Scoring interpretation table:  Score <3 = Low Risk for SUD  Score between 4-7 = Moderate Risk for SUD  Score >8 = High Risk for Opioid Abuse   Risk Mitigation Strategies:  Patient opioid safety counseling: Completed today. Counseling provided to patient as per "Patient Counseling Document". Document signed by patient, attesting to counseling and understanding Patient-Prescriber Agreement (PPA): Obtained today.  Controlled substance notification to other providers: Written and sent today.  Pharmacologic Plan: Today we may be taking over the patient's pharmacological regimen. See below.             Laboratory Chemistry  Inflammation Markers (CRP: Acute Phase) (ESR: Chronic Phase) Lab Results  Component Value Date   CRP 5 10/02/2017   ESRSEDRATE 13 10/02/2017                         Rheumatology Markers Lab Results  Component Value Date   RF 14.1 (H) 03/23/2016   ANA Negative 05/09/2017   LYMEIGGIGMAB <0.91 03/23/2016                        Renal Function Markers Lab Results  Component  Value Date   BUN 15 10/02/2017   CREATININE 0.75 (L) 10/02/2017   BCR 20 10/02/2017   GFRAA 114 10/02/2017   GFRNONAA 99 10/02/2017                             Hepatic Function Markers Lab Results  Component Value Date   AST 52 (H) 10/02/2017   ALT 38 09/18/2017   ALBUMIN 3.7 10/02/2017   ALKPHOS 149 (H) 10/02/2017                        Electrolytes Lab Results  Component Value Date   NA 140 10/02/2017   K 4.3 10/02/2017   CL 100 10/02/2017   CALCIUM 9.0 10/02/2017   MG 1.3 (L) 10/02/2017                        Neuropathy Markers Lab Results  Component Value Date   VITAMINB12 >2000 (H) 10/02/2017   FOLATE >20.0 09/18/2017   HGBA1C 5.5 02/05/2015   HIV Non Reactive 01/19/2017                        CNS Tests No results found.  Bone Pathology Markers Lab Results  Component Value Date   25OHVITD1 44 10/02/2017   25OHVITD2 <1.0 10/02/2017   25OHVITD3 44 10/02/2017                         Coagulation Parameters Lab Results  Component Value Date   INR 1.4 (H) 05/18/2017   LABPROT 14.6 (H) 05/18/2017   APTT 40.4 (H) 11/23/2012   PLT 66 (L) 08/26/2017  Cardiovascular Markers Lab Results  Component Value Date   CKTOTAL 423 (H) 01/20/2017   CKMB 1.2 12/22/2012   TROPONINI 0.05 (HH) 08/26/2017   HGB 13.4 08/26/2017   HCT 37.7 (L) 08/26/2017                         CA Markers No results found.  Note: Lab results reviewed.  Recent Diagnostic Imaging Review  Cervical Imaging: Cervical CT wo contrast:  Results for orders placed during the hospital encounter of 01/19/17  CT Cervical Spine Wo Contrast   Narrative CLINICAL DATA:  Trauma.  EXAM: CT HEAD WITHOUT CONTRAST  CT CERVICAL SPINE WITHOUT CONTRAST  TECHNIQUE: Multidetector CT imaging of the head and cervical spine was performed following the standard protocol without intravenous contrast. Multiplanar CT image reconstructions of the cervical spine were also  generated.  COMPARISON:  Head CT dated 12/22/2012.  FINDINGS: CT HEAD FINDINGS  Brain: Generalized age related parenchymal atrophy with commensurate dilatation of the ventricles and sulci. Mild chronic small vessel ischemic change within the deep periventricular white matter regions.  No mass, hemorrhage, edema or other evidence of acute parenchymal abnormality. No extra-axial hemorrhage.  Vascular: There are chronic calcified atherosclerotic changes of the large vessels at the skull base. No unexpected hyperdense vessel.  Skull: Normal. Negative for fracture or focal lesion.  Sinuses/Orbits: Mucosal thickening and/or fluid within the right maxillary sinus, of uncertain age.  Large soft tissue edema/hematoma overlying the left orbit and left lower frontal bone. No underlying fracture seen.  Other: None.  CT CERVICAL SPINE FINDINGS  Alignment: Mild scoliosis which may be accentuated by patient positioning. No evidence of acute vertebral body subluxation.  Skull base and vertebrae: No fracture line or displaced fracture fragment identified. Facet joints appear intact and normally aligned throughout.  Soft tissues and spinal canal: No prevertebral fluid or swelling. No visible canal hematoma.  Disc levels: Disc desiccations within the lower cervical spine, with associated disc space narrowings and mild osseous spurring. No more than mild central canal stenosis at any level.  Upper chest: Negative.  Other: Carotid atherosclerosis.  IMPRESSION: 1. Large soft tissue edema/hematoma overlying the left orbit and lower left frontal bone. No underlying fracture or dislocation seen. Left orbital globe appears grossly intact and normal in configuration. No retro-orbital hemorrhage or edema. 2. No acute intracranial abnormality. No intracranial hemorrhage or edema. No skull fracture. 3. No fracture or acute subluxation within the cervical spine. Scoliosis and mild  degenerative change. 4. Carotid atherosclerosis.   Electronically Signed   By: Franki Cabot M.D.   On: 01/19/2017 19:21    Shoulder Imaging: Shoulder-L MR wo contrast:  Results for orders placed during the hospital encounter of 04/27/17  MR Shoulder Left w/o contrast   Narrative CLINICAL DATA:  Left shoulder pain and limited range of motion since March, 2019. No known injury.  EXAM: MRI OF THE LEFT SHOULDER WITHOUT CONTRAST  TECHNIQUE: Multiplanar, multisequence MR imaging of the shoulder was performed. No intravenous contrast was administered.  COMPARISON:  MRI left shoulder 02/16/2007.  FINDINGS: Rotator cuff: There is some heterogeneously increased T2 signal and thickening in the rotator cuff tendons consistent with tendinopathy without tear. Tendinopathy appears worst in the supraspinatus and infraspinatus.  Muscles:  Normal without atrophy or focal lesion.  Biceps long head:  Intact.  Acromioclavicular Joint: The joint has been resected without evidence of complication. Type 1 acromion. Small volume of fluid is seen in the subacromial/subdeltoid bursa.  Glenohumeral Joint: Mild degenerative change is seen.  Labrum: Labral tear extends from approximately the 8 o'clock position of the anterior, inferior labrum across the inferior labrum and into the posterior labrum to approximately the 10 o'clock position. A paralabral cyst off the anterior, inferior labrum is anterior to the neck of the glenoid and measures 0.6 cm AP x 1 cm transverse x 1 cm craniocaudal. Edema in the adjacent subscapularis muscle belly may be due to strain or inflammatory change related to the cyst.  Bones:  No fracture or worrisome lesion.  Other: None.  IMPRESSION: Rotator cuff tendinopathy most notable in the supraspinatus and infraspinatus without tear.  New labral tear extends from the anterior, inferior labrum across the inferior labrum and into the posterior labrum as  described above. There is a paralabral cyst off the anterior, inferior labrum. Edema in the adjacent subscapularis muscle belly may be due to leakage of cystic fluid or strain.  Status post resection of the [facility] joint without evidence of complication.   Electronically Signed   By: Inge Rise M.D.   On: 04/27/2017 15:20    Lumbosacral Imaging: Lumbar MR wo contrast:  Results for orders placed during the hospital encounter of 04/11/17  MR Lumbar Spine w/o contrast   Narrative CLINICAL DATA:  Low back pain for over 2 years.  EXAM: MRI LUMBAR SPINE WITHOUT CONTRAST  TECHNIQUE: Multiplanar, multisequence MR imaging of the lumbar spine was performed. No intravenous contrast was administered.  COMPARISON:  MRI lumbar spine 10/11/2014.  FINDINGS: Segmentation:  Standard.  Alignment:  Maintained.  Vertebrae: No fracture or worrisome lesion. Marrow signal is mildly heterogeneous with scattered degenerative endplate signal change appearing worst at L3-4.  Conus medullaris and cauda equina: Conus extends to the L1 level. Conus and cauda equina appear normal.  Paraspinal and other soft tissues: Negative.  Disc levels:  T11-12 is imaged in the sagittal plane only. There is a minimal disc bulge but no central canal or foraminal narrowing.  T12-L1: Negative.  L1-2: Negative.  L2-3: Shallow disc bulge with some endplate spurring. There is mild central canal and foraminal narrowing. No nerve root compression. The appearance is not markedly changed.  L3-4: Shallow broad-based disc bulge is somewhat more prominent to the left. There is some narrowing in the left lateral recess. Mild to moderate foraminal narrowing is more notable on the left. Extraforaminal disc contacts the exited L3 roots. The appearance of this level is unchanged.  L4-5: The patient has a shallow disc bulge. More focally protruding disc within and beyond the left foramen is more prominent than  on the prior exam and contacts the exiting and exited left L4 root. Moderate bilateral foraminal narrowing is seen. There is mild narrowing in the left lateral recess. Moderate facet arthropathy is noted.  L5-S1: Shallow disc bulge without stenosis.  IMPRESSION: Progressive degenerative disease at L4-5 where a disc bulge eccentrically prominent to the left and more focally protruding disc within and beyond the left foramen has increased in size. Disc contacts the exiting and exited left L4 root. There is also some narrowing in the left lateral recess at this level. The appearance lumbar spine is otherwise unchanged.  Extraforaminal disc contacts the exited L3 roots bilaterally at L3-4, unchanged.  Mild central canal and bilateral foraminal narrowing at L2-3, unchanged.   Electronically Signed   By: Inge Rise M.D.   On: 04/11/2017 20:33    Lumbar DG Bending views:  Results for orders placed during the hospital encounter of  10/02/17  DG Lumbar Spine Complete W/Bend   Narrative CLINICAL DATA:  Chronic bilateral low back pain/sciatica.  EXAM: LUMBAR SPINE - COMPLETE WITH BENDING VIEWS  COMPARISON:  MRI 04/11/2017  FINDINGS: Vertebral body alignment and heights are within normal. There is mild-to-moderate spondylosis of the lumbar spine to include facet arthropathy. Moderate disc space narrowing at the L3-4 level and to lesser extent at the L2-3 and L4-5 levels. No significant compression fracture or spondylolisthesis. Atherosclerotic plaque over the abdominal aorta.  IMPRESSION: Mild to moderate spondylosis of the lumbar spine with moderate disc disease the L3-4 level and to lesser extent at the L2-3 and L4-5 levels.   Electronically Signed   By: Marin Olp M.D.   On: 10/02/2017 15:54    Knee Imaging: Knee-R MR wo contrast:  Results for orders placed during the hospital encounter of 12/15/14  MR Knee Right Wo Contrast   Narrative CLINICAL DATA:   Medial right knee pain for 1 year.  EXAM: MRI OF THE RIGHT KNEE WITHOUT CONTRAST  TECHNIQUE: Multiplanar, multisequence MR imaging of the knee was performed. No intravenous contrast was administered.  COMPARISON:  10/16/2009  FINDINGS: MENISCI  Medial meniscus: Oblique tear of the posterior horn of the medial meniscus extending into the body and to the inferior articular surface. 23 mm cystic structure along the periphery of the posterior horn- body junction of the medial meniscus consistent with a parameniscal cyst. Second multiloculated cystic structure measuring 10 mm along the posterior aspect of the posterior horn of the medial meniscus consistent with a parameniscal cyst.  Lateral meniscus: Slight blunting of the free edge of the body of the lateral meniscus consistent with a tiny radial tear.  LIGAMENTS  Cruciates: Intact ACL and PCL. Mild thickening and increased signal of the ACL as can be seen with mucinous degeneration.  Collaterals: Thickening of the MCL proximally likely chronic without an acute tear. Lateral collateral ligament complex is intact.  CARTILAGE  Patellofemoral: Cartilage irregularity of the lateral patellar facet. Mild partial thickness cartilage loss of the lateral trochlea.  Medial:  No chondral defect.  Lateral:  Mild chondromalacia of the lateral femoral condyle.  Joint: No joint effusion. Mild edema in Hoffa's fat. No plical thickening.  Popliteal Fossa: Small Baker cyst. Severe tendinosis of the proximal popliteus tendon including the insertion.  Extensor Mechanism:  Intact.  Bones:  No marrow signal abnormality.  No fracture or dislocation.  IMPRESSION: 1. Oblique tear of the posterior horn of the medial meniscus extending into the body and to the inferior articular surface. 23 mm cystic structure along the periphery of the posterior horn- body junction of the medial meniscus consistent with a parameniscal cyst. Second  multiloculated cystic structure measuring 10 mm along the posterior aspect of the posterior horn of the medial meniscus consistent with a parameniscal cyst. 2. Severe tendinosis of the popliteus tendon including the insertion. 3. Cartilage irregularity of the lateral patellar facet and mild partial-thickness cartilage loss of the lateral trochlea. 4. Mild mucinous degeneration of the ACL which is otherwise intact.   Electronically Signed   By: Kathreen Devoid   On: 12/15/2014 08:48    Complexity Note: Imaging results reviewed. Results shared with Mr. Majid, using [employer].                         Meds   Current Outpatient Medications:  .  apixaban (ELIQUIS) 5 MG TABS tablet, Take 5 mg by mouth 2 (two) times  daily., Disp: , Rfl:  .  B Complex-C (B-COMPLEX WITH VITAMIN C) tablet, Take 1 tablet by mouth daily., Disp: , Rfl:  .  busPIRone (BUSPAR) 10 MG tablet, Take 1 tablet (10 mg total) by mouth 3 (three) times daily., Disp: 90 tablet, Rfl: 3 .  BYSTOLIC 20 MG TABS, TAKE 1 TABLET (20 MG TOTAL) BY MOUTH DAILY., Disp: 90 tablet, Rfl: 3 .  doxazosin (CARDURA) 2 MG tablet, Take 1 tablet (2 mg total) by mouth daily., Disp: 90 tablet, Rfl: 6 .  FOLIC ACID PO, Take by mouth daily., Disp: , Rfl:  .  Melatonin 10 MG TABS, Take 1 tablet by mouth at bedtime., Disp: , Rfl:  .  milk thistle 175 MG tablet, Take 175 mg by mouth daily., Disp: , Rfl:  .  Multiple Vitamin (MULTIVITAMIN) tablet, Take 1 tablet by mouth daily. Centrum Plus -Take 1 daily, Disp: , Rfl:  .  nitroGLYCERIN (NITROSTAT) 0.4 MG SL tablet, Place 1 tablet (0.4 mg total) under the tongue every 5 (five) minutes as needed for chest pain., Disp: 25 tablet, Rfl: 3 .  PRALUENT 150 MG/ML SOPN, , Disp: , Rfl:  .  tamsulosin (FLOMAX) 0.4 MG CAPS capsule, Take 0.4 mg by mouth daily. , Disp: , Rfl:  .  traMADol (ULTRAM) 50 MG tablet, TAKE ONE TABLET BY MOUTH EVERY 12 HOURS AS NEEDED FOR MODERATE PAIN, Disp: 60 tablet, Rfl: 0 .   triamcinolone cream (KENALOG) 0.1 %, Apply 1 application topically daily as needed. , Disp: , Rfl:  .  VOLTAREN 1 % GEL, Apply 2 g topically as needed (knees). , Disp: , Rfl:  .  Magnesium 500 MG CAPS, Take 1 capsule (500 mg total) by mouth 2 (two) times daily at 8 am and 10 pm., Disp: 60 capsule, Rfl: 5  ROS  Constitutional: Denies any fever or chills Gastrointestinal: No reported hemesis, hematochezia, vomiting, or acute GI distress Musculoskeletal: Denies any acute onset joint swelling, redness, loss of ROM, or weakness Neurological: No reported episodes of acute onset apraxia, aphasia, dysarthria, agnosia, amnesia, paralysis, loss of coordination, or loss of consciousness  Allergies  Mr. Rupe is allergic to clonazepam; crestor [rosuvastatin]; dust mite extract; gabapentin; hydrocodone bitartrate; lipitor [atorvastatin]; lisinopril; and topamax [topiramate].  [place]  Drug: Mr. Kuenzel  reports that he does not use drugs. Alcohol:  reports that he drinks about 2.0 standard drinks of alcohol per week. Tobacco:  reports that he has been smoking cigarettes. He has a 2.40 pack-year smoking history. He has never used smokeless tobacco. Medical:  has a past medical history of A-fib ([place]), Abnormality of gait (09/03/2012), Alcohol abuse, Anxiety, Arthritis, Arthritis, Esophageal stricture, GERD (gastroesophageal reflux disease), Gout, Hyperlipidemia, Hypertension, Internal hemorrhoids, Lesion of ulnar nerve (09/03/2012), Murmur, cardiac, PAF (paroxysmal atrial fibrillation) ([place]), Palpitations, Prostate cancer ([place]) (2016), Seizures ([place]), and Tubular adenoma of colon (03/2008). Surgical: Mr. Hemme  has a past surgical history that includes Hand surgery (Left); Circumcision; Colonoscopy; Elbow surgery; Shoulder surgery; Eye surgery; Cataract extraction w/PHACO (Right, 07/03/2014); Cardiac catheterization (N/A, 11/02/2015); Esophagogastroduodenoscopy (egd) with propofol (N/A, 05/17/2016); and Shoulder  arthroscopy (Left). Family: family history includes Diabetes in his father; Hyperlipidemia in his father; Hypertension in his father and mother; Kidney disease in his father; Lung cancer in his father and mother.  Constitutional Exam  General appearance: Well nourished, well developed, and well hydrated. In no apparent acute distress Vitals:   10/18/17 1006  BP: (!) 180/76  Pulse: 64  Temp: 98.4 F (36.9 C)  SpO2:  100%  Weight: 238 lb (108 kg)  Height: 6' (1.829 m)   BMI Assessment: Estimated body mass index is 32.28 kg/m as calculated from the following:   Height as of this encounter: 6' (1.829 m).   Weight as of this encounter: 238 lb (108 kg).  BMI interpretation table: BMI level Category Range association with higher incidence of chronic pain  <18 kg/m2 Underweight   18.5-24.9 kg/m2 Ideal body weight   25-29.9 kg/m2 Overweight Increased incidence by 20%  30-34.9 kg/m2 Obese (Class I) Increased incidence by 68%  35-39.9 kg/m2 Severe obesity (Class II) Increased incidence by 136%  >40 kg/m2 Extreme obesity (Class III) Increased incidence by 254%   Patient's current BMI Ideal Body weight  Body mass index is 32.28 kg/m. Ideal body weight: 77.6 kg (171 lb 1.2 oz) Adjusted ideal body weight: 89.7 kg (197 lb 13.5 oz)   BMI Readings from Last 4 Encounters:  10/18/17 32.28 kg/m  09/28/17 32.28 kg/m  09/15/17 31.80 kg/m  09/12/17 32.02 kg/m   Wt Readings from Last 4 Encounters:  10/18/17 238 lb (108 kg)  09/28/17 238 lb (108 kg)  09/15/17 234 lb 8 oz (106.4 kg)  09/12/17 236 lb 2 oz (107.1 kg)  Psych/Mental status: Alert, oriented x 3 (person, place, & time)       Eyes: PERLA Respiratory: No evidence of acute respiratory distress  Cervical Spine Area Exam  Skin & Axial Inspection: No masses, redness, edema, swelling, or associated skin lesions Alignment: Symmetrical Functional ROM: Unrestricted ROM      Stability: No instability detected Muscle Tone/Strength:  Functionally intact. No obvious neuro-muscular anomalies detected. Sensory (Neurological): Unimpaired Palpation: No palpable anomalies              Upper Extremity (UE) Exam    Side: Right upper extremity  Side: Left upper extremity  Skin & Extremity Inspection: Skin color, temperature, and hair growth are WNL. No peripheral edema or cyanosis. No masses, redness, swelling, asymmetry, or associated skin lesions. No contractures.  Skin & Extremity Inspection: Skin color, temperature, and hair growth are WNL. No peripheral edema or cyanosis. No masses, redness, swelling, asymmetry, or associated skin lesions. No contractures.  Functional ROM: Unrestricted ROM          Functional ROM: Unrestricted ROM          Muscle Tone/Strength: Functionally intact. No obvious neuro-muscular anomalies detected.  Muscle Tone/Strength: Functionally intact. No obvious neuro-muscular anomalies detected.  Sensory (Neurological): Unimpaired          Sensory (Neurological): Unimpaired          Palpation: No palpable anomalies              Palpation: No palpable anomalies              Provocative Test(s):  Phalen's test: deferred Tinel's test: deferred Apley's scratch test (touch opposite shoulder):  Action 1 (Across chest): deferred Action 2 (Overhead): deferred Action 3 (LB reach): deferred   Provocative Test(s):  Phalen's test: deferred Tinel's test: deferred Apley's scratch test (touch opposite shoulder):  Action 1 (Across chest): deferred Action 2 (Overhead): deferred Action 3 (LB reach): deferred    Thoracic Spine Area Exam  Skin & Axial Inspection: No masses, redness, or swelling Alignment: Symmetrical Functional ROM: Unrestricted ROM Stability: No instability detected Muscle Tone/Strength: Functionally intact. No obvious neuro-muscular anomalies detected. Sensory (Neurological): Unimpaired Muscle strength & Tone: No palpable anomalies  Lumbar Spine Area Exam  Skin & Axial Inspection: No masses,  redness, or swelling Alignment: Symmetrical Functional ROM: Unrestricted ROM       Stability: No instability detected Muscle Tone/Strength: Functionally intact. No obvious neuro-muscular anomalies detected. Sensory (Neurological): Unimpaired Palpation: No palpable anomalies       Provocative Tests: Hyperextension/rotation test: deferred today       Lumbar quadrant test (Kemp's test): deferred today       Lateral bending test: deferred today       Patrick's Maneuver: deferred today                   FABER test: deferred today                   S-I anterior distraction/compression test: deferred today         S-I lateral compression test: deferred today         S-I Thigh-thrust test: deferred today         S-I Gaenslen's test: deferred today          Gait & Posture Assessment  Ambulation: Unassisted Gait: Relatively normal for age and body habitus Posture: WNL   Lower Extremity Exam    Side: Right lower extremity  Side: Left lower extremity  Stability: No instability observed          Stability: No instability observed          Skin & Extremity Inspection: Skin color, temperature, and hair growth are WNL. No peripheral edema or cyanosis. No masses, redness, swelling, asymmetry, or associated skin lesions. No contractures.  Skin & Extremity Inspection: Skin color, temperature, and hair growth are WNL. No peripheral edema or cyanosis. No masses, redness, swelling, asymmetry, or associated skin lesions. No contractures.  Functional ROM: Unrestricted ROM                  Functional ROM: Unrestricted ROM                  Muscle Tone/Strength: Functionally intact. No obvious neuro-muscular anomalies detected.  Muscle Tone/Strength: Functionally intact. No obvious neuro-muscular anomalies detected.  Sensory (Neurological): Unimpaired  Sensory (Neurological): Unimpaired  Palpation: No palpable anomalies  Palpation: No palpable anomalies   Assessment & Plan  Primary Diagnosis & Pertinent  Problem List: The primary encounter diagnosis was Chronic pain syndrome. Diagnoses of Chronic low back pain (Primary Area of Pain) (Bilateral) (L>R) w/ sciatica (Bilateral), DDD (degenerative disc disease), lumbar, Lumbar spondylosis, Lumbar facet arthropathy, Lumbar facet syndrome, Chronic lower extremity pain (Secondary Area of Pain) (Bilateral) (L>R), Lumbar central spinal stenosis (L2-3), Lumbar lateral recess stenosis (Left: L3-4, L4-5), Lumbar foraminal stenosis, Chronic knee pain (Right), Arthropathy of knee (Right), Enthesopathy of knee region (Right), Osteoarthritis of knee (Right), Chronic thoracic back pain (Tertiary Area of Pain) (Midline), Atypical chest pain, Chronic shoulder pain (Left), Decreased shoulder range of motion (ROM) (Left), Labral tear of shoulder, sequela (Left), Tendinopathy of rotator cuff (Left), Strain of subscapularis muscle, sequela (Left), Disorder of skeletal system, Pharmacologic therapy, Long term current use of opiate analgesic, Chronic anticoagulation (ELIQUIS), History of alcohol abuse, Alcohol induced fatty liver, Problems influencing health status, Polypharmacy, Thrombocytopenia ([place]), Tobacco abuse, Hypomagnesemia, History of prostate cancer, Rheumatoid factor positive, and History of esophageal stricture were also pertinent to this visit.  Visit Diagnosis: 1. Chronic pain syndrome   2. Chronic low back pain (Primary Area of Pain) (Bilateral) (L>R) w/ sciatica (Bilateral)   3. DDD (degenerative disc disease), lumbar   4. Lumbar spondylosis   5. Lumbar facet arthropathy  6. Lumbar facet syndrome   7. Chronic lower extremity pain (Secondary Area of Pain) (Bilateral) (L>R)   8. Lumbar central spinal stenosis (L2-3)   9. Lumbar lateral recess stenosis (Left: L3-4, L4-5)   10. Lumbar foraminal stenosis   11. Chronic knee pain (Right)   12. Arthropathy of knee (Right)   13. Enthesopathy of knee region (Right)   14. Osteoarthritis of knee (Right)   15. Chronic  thoracic back pain [facility] Area of Pain) (Midline)   16. Atypical chest pain   17. Chronic shoulder pain (Left)   18. Decreased shoulder range of motion (ROM) (Left)   19. Labral tear of shoulder, sequela (Left)   20. Tendinopathy of rotator cuff (Left)   21. Strain of subscapularis muscle, sequela (Left)   22. Disorder of skeletal system   23. Pharmacologic therapy   24. Long term current use of opiate analgesic   25. Chronic anticoagulation (ELIQUIS)   26. History of alcohol abuse   27. Alcohol induced fatty liver   28. Problems influencing health status   29. Polypharmacy   30. Thrombocytopenia ([place])   31. Tobacco abuse   32. Hypomagnesemia   33. History of prostate cancer   34. Rheumatoid factor positive   35. History of esophageal stricture    Problems updated and reviewed during this visit: Problem  Rheumatoid Factor Positive  Decreased shoulder range of motion (ROM) (Left)  Tendinopathy of rotator cuff (Left)   IMPRESSION: Rotator cuff tendinopathy most notable in the supraspinatus and infraspinatus without tear.   Labral tear of shoulder, sequela (Left)  Strain of subscapularis muscle, sequela (Left)  Chronic knee pain (Right)  Arthropathy of knee (Right)  Enthesopathy of knee region (Right)   Severe tendinosis of the popliteus tendon including the insertion.   Osteoarthritis of knee (Right)  Lumbar Spondylosis  Ddd (Degenerative Disc Disease), Lumbar  Lumbar Facet Arthropathy  Lumbar facet syndrome  Lumbar lateral recess stenosis (Left: L3-4, L4-5)  Lumbar central spinal stenosis (L2-3)  Lumbar Foraminal Stenosis   L2-3: mild central canal and foraminal narrowing. L3-4: moderate foraminal narrowing is more notable on the left. Extraforaminal disc contacts the exited L3 roots. L4-5: focally protruding disc beyond the left foramen contacts the exiting and exited left L4 root. Moderate bilateral foraminal narrowing   Chronic low back pain (Primary Area of  Pain) (Bilateral) (L>R) w/ sciatica (Bilateral)  Chronic lower extremity pain (Secondary Area of Pain) (Bilateral) (L>R)  Chronic thoracic back pain (Tertiary Area of Pain) (Midline)  Chronic shoulder pain (Left)  Hypomagnesemia  Chronic anticoagulation (ELIQUIS)   Stop: 3 days before procedure(s) Restart: 6 hours after procedure(s)   History of Esophageal Stricture  Pharmacologic Therapy  Disorder of Skeletal System  Problems Influencing Health Status   Time Note: Greater than 50% of the 40 minute(s) of face-to-face time spent with Mr. Villada, was spent in counseling/coordination of care regarding: the appropriate use of the pain scale, Mr. Zelman's primary cause of pain, the results of his recent test(s), the significance of each one oth the test(s) anomalies and it's corresponding characteristic pain pattern(s), the treatment plan, treatment alternatives, the risks and possible complications of proposed treatment, realistic expectations, the goals of pain management (increased in functionality), the need to bring and keep the BMI below 30 and the need to collect and read the AVS material.  Plan of Care  NOTE: At this point, the patient is not entirely sure as to what he wants to do and therefore he wants to  consulted with his wife.  Because of this, we are currently not taking over his medication management in the event that he decides against day interventional therapies that we have offered.  He is well aware that he may actually not be a good candidate for any therapies, depending on the findings related to his thrombocytopenia and also went on not he can come off of the Eliquis to have any procedures done.  He also indicated that he has recently retired and he is closely monitoring his income and expenses and therefore he needs to do the math and determine whether or not this is something that he wants to do based on his financial status.  In any case, he did mention that he was interested in  figuring out what is going on on how to get it better, but in terms of the pain, he says that if he needs to, he can live with it.  I pointed out to the patient that... "If it ain't broken, don'tt fix it".  Pharmacotherapy (Medications Ordered): Meds ordered this encounter  Medications  . Magnesium 500 MG CAPS    Sig: Take 1 capsule (500 mg total) by mouth 2 (two) times daily at 8 am and 10 pm.    Dispense:  60 capsule    Refill:  5    Do not place medication on "Automatic Refill".  The patient may use similar over-the-counter product.   Procedure Orders     LUMBAR FACET(MEDIAL BRANCH NERVE BLOCK) MBNB  Lab Orders     Protime-INR     APTT     Platelet aggregation study, blood     Platelet function assay  Imaging Orders     DG Thoracic Spine 2 View  Referral Orders     Ambulatory referral to Rheumatology     Ambulatory referral to Hematology / Oncology  Pharmacological management options:  Opioid Analgesics: I will not be prescribing any opioids at this time Membrane stabilizer: We have discussed the possibility of optimizing this mode of therapy, if tolerated Muscle relaxant: We have discussed the possibility of a trial NSAID: We have discussed the possibility of a trial Other analgesic(s): To be determined at a later time   Interventional management options: Planned, scheduled, and/or pending:    NOTE: ELIQUIS Anticoagulation. Stop: 3 x 3 days pre-procedure. Restart: 6 hrs after procedure. Diagnostic bilateral lumbar facet block #1 under fluoroscopic guidance and IV sedation. (PRN)   Considering:   Diagnostic bilateral lumbar facet block  Possible bilateral lumbar facet RFA  Diagnostic left-sided L3-4 and L4-5 transforaminal ESI  Diagnostic left-sided L2-3 interlaminar LESI  Diagnostic bilateral L4 transforaminal ESI  Diagnostic left-sided L3 transforaminal ESI  Diagnostic right-sided intra-articular knee joint injection with local anesthetic and steroid  Therapeutic  series of 5 right-sided intra-articular Hyalgan knee injections  Diagnostic right-sided genicular nerve block  Possible right-sided genicular nerve RFA  Diagnostic left intra-articular shoulder joint injection  Diagnostic left suprascapular nerve block  Possible left-sided suprascapular nerve RFA  Diagnostic bilateral thoracic facet block  Possible bilateral thoracic facet RFA  Diagnostic midline thoracic ESI  Diagnostic trigger point injection    PRN Procedures:   None at this time   Provider-requested follow-up: Return if symptoms worsen or fail to improve, for (Blood-thinner Protocol), PRN Procedure.  No future appointments.  Primary Care Physician: [name], MD Location: [facility] Note by: Gaspar Cola, MD Date: 10/18/2017; Time: 1:34 PM

## 2017-10-18 ENCOUNTER — Other Ambulatory Visit: Payer: Self-pay

## 2017-10-18 ENCOUNTER — Encounter: Payer: Self-pay | Admitting: Pain Medicine

## 2017-10-18 ENCOUNTER — Ambulatory Visit: Payer: 59 | Attending: Pain Medicine | Admitting: Pain Medicine

## 2017-10-18 VITALS — BP 180/76 | HR 64 | Temp 98.4°F | Ht 72.0 in | Wt 238.0 lb

## 2017-10-18 DIAGNOSIS — Z79899 Other long term (current) drug therapy: Secondary | ICD-10-CM

## 2017-10-18 DIAGNOSIS — Z5181 Encounter for therapeutic drug level monitoring: Secondary | ICD-10-CM | POA: Insufficient documentation

## 2017-10-18 DIAGNOSIS — M546 Pain in thoracic spine: Secondary | ICD-10-CM

## 2017-10-18 DIAGNOSIS — Z8546 Personal history of malignant neoplasm of prostate: Secondary | ICD-10-CM | POA: Diagnosis not present

## 2017-10-18 DIAGNOSIS — M5136 Other intervertebral disc degeneration, lumbar region: Secondary | ICD-10-CM | POA: Diagnosis not present

## 2017-10-18 DIAGNOSIS — Z79891 Long term (current) use of opiate analgesic: Secondary | ICD-10-CM | POA: Insufficient documentation

## 2017-10-18 DIAGNOSIS — R768 Other specified abnormal immunological findings in serum: Secondary | ICD-10-CM

## 2017-10-18 DIAGNOSIS — G8929 Other chronic pain: Secondary | ICD-10-CM

## 2017-10-18 DIAGNOSIS — I480 Paroxysmal atrial fibrillation: Secondary | ICD-10-CM | POA: Insufficient documentation

## 2017-10-18 DIAGNOSIS — K219 Gastro-esophageal reflux disease without esophagitis: Secondary | ICD-10-CM | POA: Insufficient documentation

## 2017-10-18 DIAGNOSIS — K222 Esophageal obstruction: Secondary | ICD-10-CM | POA: Insufficient documentation

## 2017-10-18 DIAGNOSIS — Z720 Tobacco use: Secondary | ICD-10-CM

## 2017-10-18 DIAGNOSIS — M109 Gout, unspecified: Secondary | ICD-10-CM | POA: Insufficient documentation

## 2017-10-18 DIAGNOSIS — F419 Anxiety disorder, unspecified: Secondary | ICD-10-CM | POA: Insufficient documentation

## 2017-10-18 DIAGNOSIS — M47816 Spondylosis without myelopathy or radiculopathy, lumbar region: Secondary | ICD-10-CM | POA: Diagnosis not present

## 2017-10-18 DIAGNOSIS — M25561 Pain in right knee: Secondary | ICD-10-CM | POA: Diagnosis not present

## 2017-10-18 DIAGNOSIS — M899 Disorder of bone, unspecified: Secondary | ICD-10-CM

## 2017-10-18 DIAGNOSIS — M47896 Other spondylosis, lumbar region: Secondary | ICD-10-CM | POA: Insufficient documentation

## 2017-10-18 DIAGNOSIS — Z7901 Long term (current) use of anticoagulants: Secondary | ICD-10-CM | POA: Diagnosis not present

## 2017-10-18 DIAGNOSIS — M76891 Other specified enthesopathies of right lower limb, excluding foot: Secondary | ICD-10-CM | POA: Insufficient documentation

## 2017-10-18 DIAGNOSIS — M25612 Stiffness of left shoulder, not elsewhere classified: Secondary | ICD-10-CM | POA: Insufficient documentation

## 2017-10-18 DIAGNOSIS — M79604 Pain in right leg: Secondary | ICD-10-CM | POA: Insufficient documentation

## 2017-10-18 DIAGNOSIS — K700 Alcoholic fatty liver: Secondary | ICD-10-CM | POA: Insufficient documentation

## 2017-10-18 DIAGNOSIS — I10 Essential (primary) hypertension: Secondary | ICD-10-CM | POA: Insufficient documentation

## 2017-10-18 DIAGNOSIS — M1711 Unilateral primary osteoarthritis, right knee: Secondary | ICD-10-CM | POA: Diagnosis not present

## 2017-10-18 DIAGNOSIS — M79605 Pain in left leg: Secondary | ICD-10-CM | POA: Diagnosis not present

## 2017-10-18 DIAGNOSIS — S46812S Strain of other muscles, fascia and tendons at shoulder and upper arm level, left arm, sequela: Secondary | ICD-10-CM

## 2017-10-18 DIAGNOSIS — D126 Benign neoplasm of colon, unspecified: Secondary | ICD-10-CM | POA: Insufficient documentation

## 2017-10-18 DIAGNOSIS — R0789 Other chest pain: Secondary | ICD-10-CM | POA: Diagnosis not present

## 2017-10-18 DIAGNOSIS — F1011 Alcohol abuse, in remission: Secondary | ICD-10-CM

## 2017-10-18 DIAGNOSIS — R569 Unspecified convulsions: Secondary | ICD-10-CM | POA: Insufficient documentation

## 2017-10-18 DIAGNOSIS — S43432S Superior glenoid labrum lesion of left shoulder, sequela: Secondary | ICD-10-CM | POA: Insufficient documentation

## 2017-10-18 DIAGNOSIS — E785 Hyperlipidemia, unspecified: Secondary | ICD-10-CM | POA: Insufficient documentation

## 2017-10-18 DIAGNOSIS — M25512 Pain in left shoulder: Secondary | ICD-10-CM | POA: Insufficient documentation

## 2017-10-18 DIAGNOSIS — Z8719 Personal history of other diseases of the digestive system: Secondary | ICD-10-CM

## 2017-10-18 DIAGNOSIS — M48061 Spinal stenosis, lumbar region without neurogenic claudication: Secondary | ICD-10-CM | POA: Diagnosis not present

## 2017-10-18 DIAGNOSIS — I700 Atherosclerosis of aorta: Secondary | ICD-10-CM | POA: Insufficient documentation

## 2017-10-18 DIAGNOSIS — D696 Thrombocytopenia, unspecified: Secondary | ICD-10-CM | POA: Diagnosis not present

## 2017-10-18 DIAGNOSIS — Z789 Other specified health status: Secondary | ICD-10-CM

## 2017-10-18 DIAGNOSIS — M5442 Lumbago with sciatica, left side: Secondary | ICD-10-CM | POA: Insufficient documentation

## 2017-10-18 DIAGNOSIS — R002 Palpitations: Secondary | ICD-10-CM | POA: Insufficient documentation

## 2017-10-18 DIAGNOSIS — G894 Chronic pain syndrome: Secondary | ICD-10-CM | POA: Diagnosis not present

## 2017-10-18 DIAGNOSIS — M67912 Unspecified disorder of synovium and tendon, left shoulder: Secondary | ICD-10-CM

## 2017-10-18 DIAGNOSIS — M5441 Lumbago with sciatica, right side: Secondary | ICD-10-CM

## 2017-10-18 DIAGNOSIS — I2510 Atherosclerotic heart disease of native coronary artery without angina pectoris: Secondary | ICD-10-CM | POA: Insufficient documentation

## 2017-10-18 DIAGNOSIS — F17210 Nicotine dependence, cigarettes, uncomplicated: Secondary | ICD-10-CM | POA: Diagnosis not present

## 2017-10-18 MED ORDER — MAGNESIUM 500 MG PO CAPS: 500.0000 mg | ORAL_CAPSULE | Freq: Two times a day (BID) | ORAL | 5 refills | Status: DC

## 2017-10-18 NOTE — Patient Instructions (Addendum)
____________________________________________________________________________________________  Pain Scale  Introduction: The pain score used by this practice is the Verbal Numerical Rating Scale (VNRS-11). This is an 11-point scale. It is for adults and children 10 years or older. There are significant differences in how the pain score is reported, used, and applied. Forget everything you learned in the past and learn this scoring system.  General Information: The scale should reflect your current level of pain. Unless you are specifically asked for the level of your worst pain, or your average pain. If you are asked for one of these two, then it should be understood that it is over the past 24 hours.  Basic Activities of Daily Living (ADL): Personal hygiene, dressing, eating, transferring, and using restroom.  Instructions: Most patients tend to report their level of pain as a combination of two factors, their physical pain and their psychosocial pain. This last one is also known as "suffering" and it is reflection of how physical pain affects you socially and psychologically. From now on, report them separately. From this point on, when asked to report your pain level, report only your physical pain. Use the following table for reference.  Pain Clinic Pain Levels (0-5/10)  Pain Level Score  Description  No Pain 0   Mild pain 1 Nagging, annoying, but does not interfere with basic activities of daily living (ADL). Patients are able to eat, bathe, get dressed, toileting (being able to get on and off the toilet and perform personal hygiene functions), transfer (move in and out of bed or a chair without assistance), and maintain continence (able to control bladder and bowel functions). Blood pressure and heart rate are unaffected. A normal heart rate for a healthy adult ranges from 60 to 100 bpm (beats per minute).   Mild to moderate pain 2 Noticeable and distracting. Impossible to hide from other  people. More frequent flare-ups. Still possible to adapt and function close to normal. It can be very annoying and may have occasional stronger flare-ups. With discipline, patients may get used to it and adapt.   Moderate pain 3 Interferes significantly with activities of daily living (ADL). It becomes difficult to feed, bathe, get dressed, get on and off the toilet or to perform personal hygiene functions. Difficult to get in and out of bed or a chair without assistance. Very distracting. With effort, it can be ignored when deeply involved in activities.   Moderately severe pain 4 Impossible to ignore for more than a few minutes. With effort, patients may still be able to manage work or participate in some social activities. Very difficult to concentrate. Signs of autonomic nervous system discharge are evident: dilated pupils (mydriasis); mild sweating (diaphoresis); sleep interference. Heart rate becomes elevated (>115 bpm). Diastolic blood pressure (lower number) rises above 100 mmHg. Patients find relief in laying down and not moving.   Severe pain 5 Intense and extremely unpleasant. Associated with frowning face and frequent crying. Pain overwhelms the senses.  Ability to do any activity or maintain social relationships becomes significantly limited. Conversation becomes difficult. Pacing back and forth is common, as getting into a comfortable position is nearly impossible. Pain wakes you up from deep sleep. Physical signs will be obvious: pupillary dilation; increased sweating; goosebumps; brisk reflexes; cold, clammy hands and feet; nausea, vomiting or dry heaves; loss of appetite; significant sleep disturbance with inability to fall asleep or to remain asleep. When persistent, significant weight loss is observed due to the complete loss of appetite and sleep deprivation.  Blood   pressure and heart rate becomes significantly elevated. Caution: If elevated blood pressure triggers a pounding headache  associated with blurred vision, then the patient should immediately seek attention at an urgent or emergency care unit, as these may be signs of an impending stroke.    Emergency Department Pain Levels (6-10/10)  Emergency Room Pain 6 Severely limiting. Requires emergency care and should not be seen or managed at an outpatient pain management facility. Communication becomes difficult and requires great effort. Assistance to reach the emergency department may be required. Facial flushing and profuse sweating along with potentially dangerous increases in heart rate and blood pressure will be evident.   Distressing pain 7 Self-care is very difficult. Assistance is required to transport, or use restroom. Assistance to reach the emergency department will be required. Tasks requiring coordination, such as bathing and getting dressed become very difficult.   Disabling pain 8 Self-care is no longer possible. At this level, pain is disabling. The individual is unable to do even the most "basic" activities such as walking, eating, bathing, dressing, transferring to a bed, or toileting. Fine motor skills are lost. It is difficult to think clearly.   Incapacitating pain 9 Pain becomes incapacitating. Thought processing is no longer possible. Difficult to remember your own name. Control of movement and coordination are lost.   The worst pain imaginable 10 At this level, most patients pass out from pain. When this level is reached, collapse of the autonomic nervous system occurs, leading to a sudden drop in blood pressure and heart rate. This in turn results in a temporary and dramatic drop in blood flow to the brain, leading to a loss of consciousness. Fainting is one of the body's self defense mechanisms. Passing out puts the brain in a calmed state and causes it to shut down for a while, in order to begin the healing process.    Summary: 1. Refer to this scale when providing [place] with your pain level. 2. Be  accurate and careful when reporting your pain level. This will help with your care. 3. Over-reporting your pain level will lead to loss of credibility. 4. Even a level of 1/10 means that there is pain and will be treated at our facility. 5. High, inaccurate reporting will be documented as "Symptom Exaggeration", leading to loss of credibility and suspicions of possible secondary gains such as obtaining more narcotics, or wanting to appear disabled, for fraudulent reasons. 6. Only pain levels of 5 or below will be seen at our facility. 7. Pain levels of 6 and above will be sent to the Emergency Department and the appointment cancelled. ____________________________________________________________________________________________   ____________________________________________________________________________________________  Blood Thinners  Recommended Time Interval Before and After Neuraxial Block or Catheter Removal  Drug (Generic) Brand Name Time Before Time After Comments  Abciximab Reopro 15 days 2 hours   Alteplase Activase 10 days 10 days   Apixaban Eliquis 3 days 6 hours   Aspirin > 325 mg Goody Powders/Excedrin 11 days  (Usually not stopped)  Aspirin ? 81 mg  7 days  (Usually not stopped)  Cholesterol Medication Lipitor 4 days    Cilostazol Pletal 3 days 5 hours   Clopidogrel Plavix 7-10 days 2 hours   Dabigatran Pradaxa 5 days 6 hours   Delteparin Fragmin 24 hours 4 hours   Dipyridamole + ASA Aggrenox 11days 2 hours   Enoxaparin  Lovenox 24 hours 4 hours   Eptifibatide Integrillin 8 hours 2 hours   Fish oil  4 days  Fondaparinux  Arixtra 72 hours 12 hours   Garlic supplements  7 days    Ginkgo biloba  36 hours    Ginseng  24 hours    Heparin (IV)  4 hours 2 hours   Heparin ([place])  12 hours 2 hours   Hydroxychloroquine Plaquenil 11 days    LMW Heparin  24 hours    LMWH  24 hours    NSAIDs  3 days  (Usually not stopped)  Prasugrel Effient 7-10 days 6 hours   Reteplase  Retavase 10 days 10 days   Rivaroxaban Xarelto 3 days 6 hours   Streptokinase Streptase 10 days 10 days   Tenecteplase TNKase 10 days 10 days   Thrombolytics  10 days  10 days Avoid x 10 days after inj.  Ticagrelor Brilinta 5-7 days 6 hours   Ticlodipine Ticlid 10-14 days 2 hours   Tinzaparin Innohep 24 hours 4 hours   Tirofiban Aggrastat 8 hours 2 hours   Vitamin E  4 days    Warfarin Coumadin 5 days 2 hours   ____________________________________________________________________________________________  ____________________________________________________________________________________________  Preparing for Procedure with Sedation  Instructions: . Oral Intake: Do not eat or drink anything for at least 8 hours prior to your procedure. . Transportation: Public transportation is not allowed. Bring an adult driver. The driver must be physically present in our waiting room before any procedure can be started. Marland Kitchen Physical Assistance: Bring an adult physically capable of assisting you, in the event you need help. This adult should keep you company at home for at least 6 hours after the procedure. . Blood Pressure Medicine: Take your blood pressure medicine with a sip of water the morning of the procedure. . Blood thinners: Notify our staff if you are taking any blood thinners. Depending on which one you take, there will be specific instructions on how and when to stop it. . Diabetics on insulin: Notify the staff so that you can be scheduled 1st case in the morning. If your diabetes requires high dose insulin, take only  of your normal insulin dose the morning of the procedure and notify the staff that you have done so. . Preventing infections: Shower with an antibacterial soap the morning of your procedure. . Build-up your immune system: Take 1000 mg of Vitamin C with every meal (3 times a day) the day prior to your procedure. Marland Kitchen Antibiotics: Inform the staff if you have a condition or reason  that requires you to take antibiotics before dental procedures. . Pregnancy: If you are pregnant, call and cancel the procedure. . Sickness: If you have a cold, fever, or any active infections, call and cancel the procedure. . Arrival: You must be in the facility at least 30 minutes prior to your scheduled procedure. . Children: Do not bring children with you. . Dress appropriately: Bring dark clothing that you would not mind if they get stained. . Valuables: Do not bring any jewelry or valuables.  Procedure appointments are reserved for interventional treatments only. Marland Kitchen No Prescription Refills. . No medication changes will be discussed during procedure appointments. . No disability issues will be discussed.  Reasons to call and reschedule or cancel your procedure: (Following these recommendations will minimize the risk of a serious complication.) . Surgeries: Avoid having procedures within 2 weeks of any surgery. (Avoid for 2 weeks before or after any surgery). . Flu Shots: Avoid having procedures within 2 weeks of a flu shots or . (Avoid for 2 weeks before or after immunizations). Marland Kitchen  Barium: Avoid having a procedure within 7-10 days after having had a radiological study involving the use of radiological contrast. (Myelograms, Barium swallow or enema study). . Heart attacks: Avoid any elective procedures or surgeries for the initial 6 months after a "Myocardial Infarction" (Heart Attack). . Blood thinners: It is imperative that you stop these medications before procedures. Let us know if you if you take any blood thinner.  . Infection: Avoid procedures during or within two weeks of an infection (including chest colds or gastrointestinal problems). Symptoms associated with infections include: Localized redness, fever, chills, night sweats or profuse sweating, burning sensation when voiding, cough, congestion, stuffiness, runny nose, sore throat, diarrhea, nausea, vomiting, cold or Flu symptoms,  recent or current infections. It is specially important if the infection is over the area that we intend to treat. Marland Kitchen Heart and lung problems: Symptoms that may suggest an active cardiopulmonary problem include: cough, chest pain, breathing difficulties or shortness of breath, dizziness, ankle swelling, uncontrolled high or unusually low blood pressure, and/or palpitations. If you are experiencing any of these symptoms, cancel your procedure and contact your primary care physician for an evaluation.  Remember:  Regular Business hours are:  Monday to Thursday 8:00 AM to 4:00 PM  Provider's Schedule: Milinda Pointer, MD:  Procedure days: Tuesday and Thursday 7:30 AM to 4:00 PM  Gillis Santa, MD:  Procedure days: Monday and Wednesday 7:30 AM to 4:00 PM ____________________________________________________________________________________________   ____________________________________________________________________________________________  General Risks and Possible Complications  Patient Responsibilities: It is important that you read this as it is part of your informed consent. It is our duty to inform you of the risks and possible complications associated with treatments offered to you. It is your responsibility as a patient to read this and to ask questions about anything that is not clear or that you believe was not covered in this document.  Patient's Rights: You have the right to refuse treatment. You also have the right to change your mind, even after initially having agreed to have the treatment done. However, under this last option, if you wait until the last second to change your mind, you may be charged for the materials used up to that point.  Introduction: Medicine is not an Chief Strategy Officer. Everything in Medicine, including the lack of treatment(s), carries the potential for danger, harm, or loss (which is by definition: Risk). In Medicine, a complication is a secondary problem,  condition, or disease that can aggravate an already existing one. All treatments carry the risk of possible complications. The fact that a side effects or complications occurs, does not imply that the treatment was conducted incorrectly. It must be clearly understood that these can happen even when everything is done following the highest safety standards.  No treatment: You can choose not to proceed with the proposed treatment alternative. The "PRO(s)" would include: avoiding the risk of complications associated with the therapy. The "CON(s)" would include: not getting any of the treatment benefits. These benefits fall under one of three categories: diagnostic; therapeutic; and/or palliative. Diagnostic benefits include: getting information which can ultimately lead to improvement of the disease or symptom(s). Therapeutic benefits are those associated with the successful treatment of the disease. Finally, palliative benefits are those related to the decrease of the primary symptoms, without necessarily curing the condition (example: decreasing the pain from a flare-up of a chronic condition, such as incurable terminal cancer).  General Risks and Complications: These are associated to most interventional treatments. They can occur alone,  or in combination. They fall under one of the following six (6) categories: no benefit or worsening of symptoms; bleeding; infection; nerve damage; allergic reactions; and/or death. 1. No benefits or worsening of symptoms: In Medicine there are no guarantees, only probabilities. No healthcare provider can ever guarantee that a medical treatment will work, they can only state the probability that it may. Furthermore, there is always the possibility that the condition may worsen, either directly, or indirectly, as a consequence of the treatment. 2. Bleeding: This is more common if the patient is taking a blood thinner, either prescription or over the counter (example: Goody  Powders, Fish oil, Aspirin, Garlic, etc.), or if suffering a condition associated with impaired coagulation (example: Hemophilia, cirrhosis of the liver, low platelet counts, etc.). However, even if you do not have one on these, it can still happen. If you have any of these conditions, or take one of these drugs, make sure to notify your treating physician. 3. Infection: This is more common in patients with a compromised immune system, either due to disease (example: diabetes, cancer, human immunodeficiency virus [HIV], etc.), or due to medications or treatments (example: therapies used to treat cancer and rheumatological diseases). However, even if you do not have one on these, it can still happen. If you have any of these conditions, or take one of these drugs, make sure to notify your treating physician. 4. Nerve Damage: This is more common when the treatment is an invasive one, but it can also happen with the use of medications, such as those used in the treatment of cancer. The damage can occur to small secondary nerves, or to large primary ones, such as those in the spinal cord and brain. This damage may be temporary or permanent and it may lead to impairments that can range from temporary numbness to permanent paralysis and/or brain death. 5. Allergic Reactions: Any time a substance or material comes in contact with our body, there is the possibility of an allergic reaction. These can range from a mild skin rash (contact dermatitis) to a severe systemic reaction (anaphylactic reaction), which can result in death. 6. Death: In general, any medical intervention can result in death, most of the time due to an unforeseen complication. ____________________________________________________________________________________________  ____________________________________________________________________________________________  Medication Rules  Applies to: All patients receiving prescriptions (written or  electronic).  Pharmacy of record: Pharmacy where electronic prescriptions will be sent. If written prescriptions are taken to a different pharmacy, please inform the nursing staff. The pharmacy listed in the electronic medical record should be the one where you would like electronic prescriptions to be sent.  Prescription refills: Only during scheduled appointments. Applies to both, written and electronic prescriptions.  NOTE: The following applies primarily to controlled substances (Opioid* Pain Medications).   Patient's responsibilities: 1. Pain Pills: Bring all pain pills to every appointment (except for procedure appointments). 2. Pill Bottles: Bring pills in original pharmacy bottle. Always bring newest bottle. Bring bottle, even if empty. 3. Medication refills: You are responsible for knowing and keeping track of what medications you need refilled. The day before your appointment, write a list of all prescriptions that need to be refilled. Bring that list to your appointment and give it to the admitting nurse. Prescriptions will be written only during appointments. If you forget a medication, it will not be "Called in", "Faxed", or "electronically sent". You will need to get another appointment to get these prescribed. 4. Prescription Accuracy: You are responsible for carefully inspecting your prescriptions  before leaving our office. Have the discharge nurse carefully go over each prescription with you, before taking them home. Make sure that your name is accurately spelled, that your address is correct. Check the name and dose of your medication to make sure it is accurate. Check the number of pills, and the written instructions to make sure they are clear and accurate. Make sure that you are given enough medication to last until your next medication refill appointment. 5. Taking Medication: Take medication as prescribed. Never take more pills than instructed. Never take medication more  frequently than prescribed. Taking less pills or less frequently is permitted and encouraged, when it comes to controlled substances (written prescriptions).  6. Inform other Doctors: Always inform, all of your healthcare providers, of all the medications you take. 7. Pain Medication from other Providers: You are not allowed to accept any additional pain medication from any other Doctor or Healthcare provider. There are two exceptions to this rule. (see below) In the event that you require additional pain medication, you are responsible for notifying us, as stated below. 8. Medication Agreement: You are responsible for carefully reading and following our Medication Agreement. This must be signed before receiving any prescriptions from our practice. Safely store a copy of your signed Agreement. Violations to the Agreement will result in no further prescriptions. (Additional copies of our Medication Agreement are available upon request.) 9. Laws, Rules, & Regulations: All patients are expected to follow all Federal and [employer], [employer], Rules, [employer]. Ignorance of the Laws does not constitute a valid excuse. The use of any illegal substances is prohibited. 10. Adopted CDC guidelines & recommendations: Target dosing levels will be at or below 60 MME/day. Use of benzodiazepines** is not recommended.  Exceptions: There are only two exceptions to the rule of not receiving pain medications from other Healthcare Providers. 1. Exception #1 (Emergencies): In the event of an emergency (i.e.: accident requiring emergency care), you are allowed to receive additional pain medication. However, you are responsible for: As soon as you are able, call our office [phone], at any time of the day or night, and leave a message stating your name, the date and nature of the emergency, and the name and dose of the medication prescribed. In the event that your call is answered by a member of our staff, make sure to  document and save the date, time, and the name of the person that took your information.  2. Exception #2 (Planned Surgery): In the event that you are scheduled by another doctor or dentist to have any type of surgery or procedure, you are allowed (for a period no longer than 30 days), to receive additional pain medication, for the acute post-op pain. However, in this case, you are responsible for picking up a copy of our "Post-op Pain Management for Surgeons" handout, and giving it to your surgeon or dentist. This document is available at our office, and does not require an appointment to obtain it. Simply go to our office during business hours (Monday-Thursday from 8:00 AM to 4:00 PM) (Friday 8:00 AM to 12:00 Noon) or if you have a scheduled appointment with [place], prior to your surgery, and ask for it by name. In addition, you will need to provide [place] with your name, name of your surgeon, type of surgery, and date of procedure or surgery.  *Opioid medications include: morphine, codeine, oxycodone, oxymorphone, hydrocodone, hydromorphone, meperidine, tramadol, tapentadol, buprenorphine, fentanyl, methadone. **Benzodiazepine medications include: diazepam (Valium), alprazolam (Xanax),  clonazepam (Klonopine), lorazepam (Ativan), clorazepate (Tranxene), chlordiazepoxide (Librium), estazolam (Prosom), oxazepam (Serax), temazepam (Restoril), triazolam (Halcion) (Last updated: 03/09/2017) ____________________________________________________________________________________________   ____________________________________________________________________________________________  Medication Recommendations and Reminders  Applies to: All patients receiving prescriptions (written and/or electronic).  Medication Rules & Regulations: These rules and regulations exist for your safety and that of others. They are not flexible and neither are we. Dismissing or ignoring them will be considered "non-compliance" with  medication therapy, resulting in complete and irreversible termination of such therapy. (See document titled "Medication Rules" for more details.) In all conscience, because of safety reasons, we cannot continue providing a therapy where the patient does not follow instructions.  Pharmacy of record:   Definition: This is the pharmacy where your electronic prescriptions will be sent.   We do not endorse any particular pharmacy.  You are not restricted in your choice of pharmacy.  The pharmacy listed in the electronic medical record should be the one where you want electronic prescriptions to be sent.  If you choose to change pharmacy, simply notify our nursing staff of your choice of new pharmacy.  Recommendations:  Keep all of your pain medications in a safe place, under lock and key, even if you live alone.   After you fill your prescription, take 1 week's worth of pills and put them away in a safe place. You should keep a separate, properly labeled bottle for this purpose. The remainder should be kept in the original bottle. Use this as your primary supply, until it runs out. Once it's gone, then you know that you have 1 week's worth of medicine, and it is time to come in for a prescription refill. If you do this correctly, it is unlikely that you will ever run out of medicine.  To make sure that the above recommendation works, it is very important that you make sure your medication refill appointments are scheduled at least 1 week before you run out of medicine. To do this in an effective manner, make sure that you do not leave the office without scheduling your next medication management appointment. Always ask the nursing staff to show you in your prescription , when your medication will be running out. Then arrange for the receptionist to get you a return appointment, at least 7 days before you run out of medicine. Do not wait until you have 1 or 2 pills left, to come in. This is very poor  planning and does not take into consideration that we may need to cancel appointments due to bad weather, sickness, or emergencies affecting our staff.  "Partial Fill": If for any reason your pharmacy does not have enough pills/tablets to completely fill or refill your prescription, do not allow for a "partial fill". You will need a separate prescription to fill the remaining amount, which we will not provide. If the reason for the partial fill is your insurance, you will need to talk to the pharmacist about payment alternatives for the remaining tablets, but again, do not accept a partial fill.  Prescription refills and/or changes in medication(s):   Prescription refills, and/or changes in dose or medication, will be conducted only during scheduled medication management appointments. (Applies to both, written and electronic prescriptions.)  No refills on procedure days. No medication will be changed or started on procedure days. No changes, adjustments, and/or refills will be conducted on a procedure day. Doing so will interfere with the diagnostic portion of the procedure.  No phone refills. No medications will be "called into  the pharmacy".  No Fax refills.  No weekend refills.  No Holliday refills.  No after hours refills.  Remember:  Business hours are:  Monday to Thursday 8:00 AM to 4:00 PM Provider's Schedule: Dionisio David, NP - Appointments are:  Medication management: Monday to Thursday 8:00 AM to 4:00 PM Milinda Pointer, MD - Appointments are:  Medication management: Monday and Wednesday 8:00 AM to 4:00 PM Procedure day: Tuesday and Thursday 7:30 AM to 4:00 PM Gillis Santa, MD - Appointments are:  Medication management: Tuesday and Thursday 8:00 AM to 4:00 PM Procedure day: Monday and Wednesday 7:30 AM to 4:00 PM (Last update:  03/09/2017) ____________________________________________________________________________________________   ____________________________________________________________________________________________  CANNABIDIOL (AKA: CBD Oil or Pills)  Applies to: All patients receiving prescriptions of controlled substances (written and/or electronic).  General Information: Cannabidiol (CBD) was discovered in 69. It is one of some 113 identified cannabinoids in cannabis (Marijuana) plants, accounting for up to 40% of the plant's extract. As of 2018, preliminary clinical research on cannabidiol included studies of anxiety, cognition, movement disorders, and pain.  Cannabidiol is consummed in multiple ways, including inhalation of cannabis smoke or vapor, as an aerosol spray into the cheek, and by mouth. It may be supplied as CBD oil containing CBD as the active ingredient (no added tetrahydrocannabinol (THC) or terpenes), a full-plant CBD-dominant hemp extract oil, capsules, dried cannabis, or as a liquid solution. CBD is thought not have the same psychoactivity as THC, and may affect the actions of THC. Studies suggest that CBD may interact with different biological targets, including cannabinoid receptors and other neurotransmitter receptors. As of 2018 the mechanism of action for its biological effects has not been determined.  In the [place], cannabidiol has a limited approval by the Food and Drug Administration (FDA) for treatment of only two types of epilepsy disorders. The side effects of long-term use of the drug include somnolence, decreased appetite, diarrhea, fatigue, malaise, weakness, sleeping problems, and others.  CBD remains a Schedule I drug prohibited for any use.  Legality: Some manufacturers ship CBD products nationally, an illegal action which the FDA has not enforced in 2018, with CBD remaining the subject of an FDA investigational new drug evaluation, and is not considered legal as  a dietary supplement or food ingredient as of December 2018. Federal illegality has made it difficult historically to conduct research on CBD. CBD is openly sold in head shops and health food stores in some states where such sales have not been explicitly legalized.  Warning: Because it is not FDA approved for general use or treatment of pain, it is not required to undergo the same manufacturing controls as prescription drugs.  This means that the available cannabidiol (CBD) may be contaminated with THC.  If this is the case, it will trigger a positive urine drug screen (UDS) test for cannabinoids (Marijuana).  Because a positive UDS for illicit substances is a violation of our medication agreement, your opioid analgesics (pain medicine) may be permanently discontinued. (Last update: 03/30/2017) ____________________________________________________________________________________________  A prescription for Magnesium was sent to your pharmacy.

## 2017-10-19 DIAGNOSIS — J301 Allergic rhinitis due to pollen: Secondary | ICD-10-CM | POA: Diagnosis not present

## 2017-10-23 ENCOUNTER — Ambulatory Visit: Payer: 59 | Admitting: Adult Health

## 2017-10-23 ENCOUNTER — Ambulatory Visit: Payer: 59 | Admitting: Nurse Practitioner

## 2017-10-27 ENCOUNTER — Other Ambulatory Visit: Payer: Self-pay | Admitting: Family Medicine

## 2017-10-27 NOTE — Telephone Encounter (Signed)
Last OV 09/12/17 Last fill 09/12/17  Ok to fill?

## 2017-10-28 NOTE — Telephone Encounter (Signed)
Controlled substance database reviewed.  Refill sent to pharmacy. 

## 2017-10-31 ENCOUNTER — Ambulatory Visit (INDEPENDENT_AMBULATORY_CARE_PROVIDER_SITE_OTHER): Payer: 59

## 2017-10-31 ENCOUNTER — Ambulatory Visit: Payer: 59 | Admitting: Oncology

## 2017-10-31 ENCOUNTER — Encounter: Payer: Self-pay | Admitting: Family Medicine

## 2017-10-31 ENCOUNTER — Ambulatory Visit (INDEPENDENT_AMBULATORY_CARE_PROVIDER_SITE_OTHER): Payer: 59 | Admitting: Family Medicine

## 2017-10-31 ENCOUNTER — Other Ambulatory Visit: Payer: 59

## 2017-10-31 VITALS — BP 148/78 | HR 77 | Temp 97.9°F | Ht 72.0 in | Wt 247.0 lb

## 2017-10-31 DIAGNOSIS — R05 Cough: Secondary | ICD-10-CM

## 2017-10-31 DIAGNOSIS — R059 Cough, unspecified: Secondary | ICD-10-CM

## 2017-10-31 DIAGNOSIS — J069 Acute upper respiratory infection, unspecified: Secondary | ICD-10-CM | POA: Diagnosis not present

## 2017-10-31 DIAGNOSIS — R0982 Postnasal drip: Secondary | ICD-10-CM

## 2017-10-31 DIAGNOSIS — R070 Pain in throat: Secondary | ICD-10-CM | POA: Diagnosis not present

## 2017-10-31 LAB — POCT RAPID STREP A (OFFICE): RAPID STREP A SCREEN: NEGATIVE

## 2017-10-31 LAB — POC INFLUENZA A&B (BINAX/QUICKVUE)
Influenza A, POC: NEGATIVE
Influenza B, POC: NEGATIVE

## 2017-10-31 MED ORDER — ALBUTEROL SULFATE HFA 108 (90 BASE) MCG/ACT IN AERS: 2.0000 | INHALATION_SPRAY | Freq: Four times a day (QID) | RESPIRATORY_TRACT | 0 refills | Status: DC | PRN

## 2017-10-31 MED ORDER — BENZONATATE 100 MG PO CAPS: 100.0000 mg | ORAL_CAPSULE | Freq: Two times a day (BID) | ORAL | 0 refills | Status: DC | PRN

## 2017-10-31 MED ORDER — PREDNISONE 5 MG PO TABS: 5.0000 mg | ORAL_TABLET | Freq: Every day | ORAL | 0 refills | Status: AC

## 2017-10-31 NOTE — Progress Notes (Signed)
Subjective:    Patient ID: Peter [name], male    DOB: 02/12/1955, 62 y.o.   MRN: [id number]  HPI   Patient presents to clinic due to cough, congestion, sore throat, sneezing for 5 days.  Wife has similar symptoms, hers seem to be improving.  Patient states he used to be a very heavy smoker, but currently only smokes a few cigarettes per day.  States when he coughs he does not bring up much phlegm and when he does it is usually clear or white.  Denies any fever or chills.  Patient did have a flu shot approximately 3 weeks ago.  Patient Active Problem List   Diagnosis Date Noted  . Rheumatoid factor positive 10/18/2017  . Hypomagnesemia 10/18/2017  . Chronic anticoagulation (ELIQUIS) 10/18/2017  . Decreased shoulder range of motion (ROM) (Left) 10/18/2017  . Tendinopathy of rotator cuff (Left) 10/18/2017  . Labral tear of shoulder, sequela (Left) 10/18/2017  . Strain of subscapularis muscle, sequela (Left) 10/18/2017  . Chronic knee pain (Right) 10/18/2017  . Arthropathy of knee (Right) 10/18/2017  . Enthesopathy of knee region (Right) 10/18/2017  . Osteoarthritis of knee (Right) 10/18/2017  . Lumbar spondylosis 10/18/2017  . DDD (degenerative disc disease), lumbar 10/18/2017  . Lumbar facet arthropathy 10/18/2017  . Lumbar facet syndrome 10/18/2017  . Lumbar lateral recess stenosis (Left: L3-4, L4-5) 10/18/2017  . Lumbar central spinal stenosis (L2-3) 10/18/2017  . Lumbar foraminal stenosis 10/18/2017  . History of esophageal stricture 10/18/2017  . Chronic low back pain (Primary Area of Pain) (Bilateral) (L>R) w/ sciatica (Bilateral) 09/28/2017  . Chronic lower extremity pain (Secondary Area of Pain) (Bilateral) (L>R) 09/28/2017  . Chronic pain syndrome 09/28/2017  . Long term current use of opiate analgesic 09/28/2017  . Pharmacologic therapy 09/28/2017  . Disorder of skeletal system 09/28/2017  . Problems influencing health status 09/28/2017  . Chronic thoracic back pain  [facility] Area of Pain) (Midline) 09/28/2017  . Atypical chest pain 09/12/2017  . Chronic shoulder pain (Left) 06/12/2017  . Allergic rhinitis 04/24/2017  . Thrombocytopenia ([place]) 01/23/2017  . Polypharmacy 01/20/2017  . Syncope 01/19/2017  . Elevated troponin 01/19/2017  . Sinusitis 06/08/2016  . Tremor 03/23/2016  . Paresthesia 03/23/2016  . Anxiety 02/23/2016  . Typical atrial flutter ([place]) 09/21/2015  . CAD (coronary artery disease) 05/06/2015  . Aortic atherosclerosis ([place]) 03/20/2015  . History of alcohol abuse 02/04/2015  . Alcohol induced fatty liver 02/04/2015  . Obesity (BMI 30.0-34.9) 02/04/2015  . History of prostate cancer 02/04/2015  . Chronic venous insufficiency 10/12/2012  . Lesion of ulnar nerve 09/03/2012  . Tobacco abuse 04/05/2011  . Hyperlipidemia 01/21/2010  . Essential hypertension 12/16/2009  . Premature beats 12/16/2009   Social History   Tobacco Use  . Smoking status: Current Every Day Smoker    Packs/day: 0.10    Years: 24.00    Pack years: 2.40    Types: Cigarettes  . Smokeless tobacco: Never Used  . Tobacco comment: down to 4-5  ciggs a day.  Substance Use Topics  . Alcohol use: Yes    Alcohol/week: 2.0 standard drinks    Types: 2 Cans of beer per week    Frequency: Never   Review of Systems  Constitutional: Negative for chills, fatigue and fever.  HENT: +congestion, sinus congestion and sore throat.   Eyes: Negative.   Respiratory: +cough, some wheezing.    Cardiovascular: Negative for chest pain, palpitations and leg swelling.  Gastrointestinal: Negative for abdominal pain, diarrhea, nausea  and vomiting.  Genitourinary: Negative for dysuria, frequency and urgency.  Musculoskeletal: Negative for arthralgias and myalgias.  Skin: Negative for color change, pallor and rash.  Neurological: Negative for syncope, light-headedness and headaches.  Psychiatric/Behavioral: The patient is not nervous/anxious.       Objective:   Physical  Exam  Constitutional: He is oriented to person, place, and time.  HENT:  Head: Normocephalic and atraumatic.  Mouth/Throat: No oropharyngeal exudate.  +post nasal drip. +rhinorrhea, clear  Eyes: EOM are normal. No scleral icterus.  Neck: Neck supple. No tracheal deviation present.  Cardiovascular: Normal rate and regular rhythm.  Pulmonary/Chest: Effort normal and breath sounds normal. No stridor. No respiratory distress. He has no wheezes. He has no rales.  Musculoskeletal: He exhibits no edema.  Gait normal.  Lymphadenopathy:    He has no cervical adenopathy.  Neurological: He is alert and oriented to person, place, and time. No cranial nerve deficit.  Skin: Skin is warm and dry. No pallor.  Psychiatric: He has a normal mood and affect. His behavior is normal.  Nursing note and vitals reviewed.  Vitals:   10/31/17 1120  BP: (!) 148/78  Pulse: 77  Temp: 97.9 F (36.6 C)  SpO2: 93%      Flu A/B and Rapid Strep are negative in clinic   Assessment & Plan:   Viral URI, cough, postnasal drip, pain in throat - I believe all patient's symptoms are related to a viral upper respiratory infection.  Patient will use Tessalon Perles as needed for cough, he will use inhaler as needed for any wheezing or feelings of shortness of breath, he will take 5-day prednisone burst.  Advised to rest, increase fluids, do good handwashing.  Patient also advised he can take a Claritin or another over-the-counter antihistamine once daily to help dry up nasal congestion.  Patient also has Nasacort nasal spray at home, advised he can use this to improve congestion and postnasal drip as well.  We will do chest x-ray in clinic to rule out any pneumonia.  Keep regularly scheduled follow-up with PCP as planned.  Return to clinic sooner if issues arise.

## 2017-10-31 NOTE — Patient Instructions (Signed)
Viral Upper Respiratory Infection, Adult Most upper respiratory infections (URIs) are caused by a virus. A URI affects the nose, throat, and upper air passages. The most common type of URI is often called "the common cold" which is a virus. Follow these instructions at home:  Take medicines only as told by your doctor.  Gargle warm saltwater or take cough drops to comfort your throat as told by your doctor.  Use a warm mist humidifier or inhale steam from a shower to increase air moisture. This may make it easier to breathe.  Drink enough fluid to keep your pee (urine) clear or pale yellow.  Eat soups and other clear broths.  Have a healthy diet.  Rest as needed.  Go back to work when your fever is gone or your doctor says it is okay. ? You may need to stay home longer to avoid giving your URI to others. ? You can also wear a face mask and wash your hands often to prevent spread of the virus.  Use your inhaler more if you have asthma.  Do not use any tobacco products, including cigarettes, chewing tobacco, or electronic cigarettes. If you need help quitting, ask your doctor. Contact a doctor if:  You are getting worse, not better.  Your symptoms are not helped by medicine.  You have chills.  You are getting more short of breath.  You have brown or red mucus.  You have yellow or brown discharge from your nose.  You have pain in your face, especially when you bend forward.  You have a fever.  You have puffy (swollen) neck glands.  You have pain while swallowing.  You have white areas in the back of your throat. Get help right away if:  You have very bad or constant: ? Headache. ? Ear pain. ? Pain in your forehead, behind your eyes, and over your cheekbones (sinus pain). ? Chest pain.  You have long-lasting (chronic) lung disease and any of the following: ? Wheezing. ? Long-lasting cough. ? Coughing up blood. ? A change in your usual mucus.  You have a stiff  neck.  You have changes in your: ? Vision. ? Hearing. ? Thinking. ? Mood. This information is not intended to replace advice given to you by your health care provider. Make sure you discuss any questions you have with your health care provider. Document Released: 06/15/2007 Document Revised: 08/30/2015 Document Reviewed: 04/03/2013 Elsevier Interactive Patient Education  2018 [employer].

## 2017-11-01 ENCOUNTER — Encounter: Payer: Self-pay | Admitting: Family Medicine

## 2017-11-01 ENCOUNTER — Telehealth: Payer: Self-pay | Admitting: Family Medicine

## 2017-11-01 NOTE — Telephone Encounter (Signed)
Copied from [place] [id number]. Topic: General - Other >> Nov 01, 2017 11:36 AM Janace Aris A wrote: Reason for CRM: pt called in, he saw Lauren Guse yesterday, he says the medication albuterol (PROVENTIL HFA;VENTOLIN HFA) 108 (90 Base) MCG/ACT inhaler , and benzonatate (TESSALON) 100 MG capsule that was prescribed to him, has been making his BP go up and he would not like to take these any longer. He says he started the prednisone this morning. Pt is requesting the medication Biaxin, he says he took this before and it worked well for him.  Also any medication can be sent to the [facility].

## 2017-11-01 NOTE — Telephone Encounter (Signed)
This encounter was created in error - please disregard.

## 2017-11-02 ENCOUNTER — Ambulatory Visit: Payer: 59 | Admitting: Internal Medicine

## 2017-11-02 LAB — CULTURE, UPPER RESPIRATORY
MICRO NUMBER: 91268477
SPECIMEN QUALITY: ADEQUATE

## 2017-11-08 ENCOUNTER — Ambulatory Visit (INDEPENDENT_AMBULATORY_CARE_PROVIDER_SITE_OTHER): Payer: 59 | Admitting: Internal Medicine

## 2017-11-08 ENCOUNTER — Encounter: Payer: Self-pay | Admitting: Internal Medicine

## 2017-11-08 VITALS — BP 170/74 | HR 69 | Temp 98.3°F | Ht 72.0 in | Wt 261.8 lb

## 2017-11-08 DIAGNOSIS — N401 Enlarged prostate with lower urinary tract symptoms: Secondary | ICD-10-CM

## 2017-11-08 DIAGNOSIS — J029 Acute pharyngitis, unspecified: Secondary | ICD-10-CM

## 2017-11-08 DIAGNOSIS — J40 Bronchitis, not specified as acute or chronic: Secondary | ICD-10-CM

## 2017-11-08 DIAGNOSIS — Z720 Tobacco use: Secondary | ICD-10-CM

## 2017-11-08 DIAGNOSIS — M25512 Pain in left shoulder: Secondary | ICD-10-CM

## 2017-11-08 DIAGNOSIS — I10 Essential (primary) hypertension: Secondary | ICD-10-CM | POA: Diagnosis not present

## 2017-11-08 DIAGNOSIS — I25118 Atherosclerotic heart disease of native coronary artery with other forms of angina pectoris: Secondary | ICD-10-CM

## 2017-11-08 DIAGNOSIS — G8929 Other chronic pain: Secondary | ICD-10-CM

## 2017-11-08 DIAGNOSIS — R600 Localized edema: Secondary | ICD-10-CM | POA: Diagnosis not present

## 2017-11-08 DIAGNOSIS — N400 Enlarged prostate without lower urinary tract symptoms: Secondary | ICD-10-CM | POA: Insufficient documentation

## 2017-11-08 MED ORDER — AZITHROMYCIN 250 MG PO TABS: ORAL_TABLET | ORAL | 0 refills | Status: DC

## 2017-11-08 MED ORDER — IPRATROPIUM BROMIDE 0.02 % IN SOLN
0.5000 mg | Freq: Once | RESPIRATORY_TRACT | Status: AC
  Administered 2017-11-08: 0.5 mg via RESPIRATORY_TRACT

## 2017-11-08 MED ORDER — ALBUTEROL SULFATE (2.5 MG/3ML) 0.083% IN NEBU
2.5000 mg | INHALATION_SOLUTION | Freq: Once | RESPIRATORY_TRACT | Status: AC
  Administered 2017-11-08: 2.5 mg via RESPIRATORY_TRACT

## 2017-11-08 MED ORDER — HYDROCHLOROTHIAZIDE 12.5 MG PO TABS: 12.5000 mg | ORAL_TABLET | Freq: Every day | ORAL | 2 refills | Status: DC

## 2017-11-08 NOTE — Progress Notes (Signed)
Pre visit review using our clinic review tool, if applicable. No additional management support is needed unless otherwise documented below in the visit note. 

## 2017-11-08 NOTE — Patient Instructions (Addendum)
F/u with Dr. Chauncey Cruel in 10-14 days sooner if not better call the clinic to let us know   Acute Bronchitis, Adult Acute bronchitis is sudden (acute) swelling of the air tubes (bronchi) in the lungs. Acute bronchitis causes these tubes to fill with mucus, which can make it hard to breathe. It can also cause coughing or wheezing. In adults, acute bronchitis usually goes away within 2 weeks. A cough caused by bronchitis may last up to 3 weeks. Smoking, allergies, and asthma can make the condition worse. Repeated episodes of bronchitis may cause further lung problems, such as chronic obstructive pulmonary disease (COPD). What are the causes? This condition can be caused by germs and by substances that irritate the lungs, including:  Cold and flu viruses. This condition is most often caused by the same virus that causes a cold.  Bacteria.  Exposure to tobacco smoke, dust, fumes, and air pollution.  What increases the risk? This condition is more likely to develop in people who:  Have close contact with someone with acute bronchitis.  Are exposed to lung irritants, such as tobacco smoke, dust, fumes, and vapors.  Have a weak immune system.  Have a respiratory condition such as asthma.  What are the signs or symptoms? Symptoms of this condition include:  A cough.  Coughing up clear, yellow, or green mucus.  Wheezing.  Chest congestion.  Shortness of breath.  A fever.  Body aches.  Chills.  A sore throat.  How is this diagnosed? This condition is usually diagnosed with a physical exam. During the exam, your health care provider may order tests, such as chest X-rays, to rule out other conditions. He or she may also:  Test a sample of your mucus for bacterial infection.  Check the level of oxygen in your blood. This is done to check for pneumonia.  Do a chest X-ray or lung function testing to rule out pneumonia and other conditions.  Perform blood tests.  Your health care  provider will also ask about your symptoms and medical history. How is this treated? Most cases of acute bronchitis clear up over time without treatment. Your health care provider may recommend:  Drinking more fluids. Drinking more makes your mucus thinner, which may make it easier to breathe.  Taking a medicine for a fever or cough.  Taking an antibiotic medicine.  Using an inhaler to help improve shortness of breath and to control a cough.  Using a cool mist vaporizer or humidifier to make it easier to breathe.  Follow these instructions at home: Medicines  Take over-the-counter and prescription medicines only as told by your health care provider.  If you were prescribed an antibiotic, take it as told by your health care provider. Do not stop taking the antibiotic even if you start to feel better. General instructions  Get plenty of rest.  Drink enough fluids to keep your urine clear or pale yellow.  Avoid smoking and secondhand smoke. Exposure to cigarette smoke or irritating chemicals will make bronchitis worse. If you smoke and you need help quitting, ask your health care provider. Quitting smoking will help your lungs heal faster.  Use an inhaler, cool mist vaporizer, or humidifier as told by your health care provider.  Keep all follow-up visits as told by your health care provider. This is important. How is this prevented? To lower your risk of getting this condition again:  Wash your hands often with soap and water. If soap and water are not available, use  hand sanitizer.  Avoid contact with people who have cold symptoms.  Try not to touch your hands to your mouth, nose, or eyes.  Make sure to get the flu shot every year.  Contact a health care provider if:  Your symptoms do not improve in 2 weeks of treatment. Get help right away if:  You cough up blood.  You have chest pain.  You have severe shortness of breath.  You become dehydrated.  You faint or  keep feeling like you are going to faint.  You keep vomiting.  You have a severe headache.  Your fever or chills gets worse. This information is not intended to replace advice given to you by your health care provider. Make sure you discuss any questions you have with your health care provider. Document Released: 02/04/2004 Document Revised: 07/22/2015 Document Reviewed: 06/17/2015 Elsevier Interactive Patient Education  2018 [place].  Cough, Adult Coughing is a reflex that clears your throat and your airways. Coughing helps to heal and protect your lungs. It is normal to cough occasionally, but a cough that happens with other symptoms or lasts a long time may be a sign of a condition that needs treatment. A cough may last only 2-3 weeks (acute), or it may last longer than 8 weeks (chronic). What are the causes? Coughing is commonly caused by:  Breathing in substances that irritate your lungs.  A viral or bacterial respiratory infection.  Allergies.  Asthma.  Postnasal drip.  Smoking.  Acid backing up from the stomach into the esophagus (gastroesophageal reflux).  Certain medicines.  Chronic lung problems, including COPD (or rarely, lung cancer).  Other medical conditions such as heart failure.  Follow these instructions at home: Pay attention to any changes in your symptoms. Take these actions to help with your discomfort:  Take medicines only as told by your health care provider. ? If you were prescribed an antibiotic medicine, take it as told by your health care provider. Do not stop taking the antibiotic even if you start to feel better. ? Talk with your health care provider before you take a cough suppressant medicine.  Drink enough fluid to keep your urine clear or pale yellow.  If the air is dry, use a cold steam vaporizer or humidifier in your bedroom or your home to help loosen secretions.  Avoid anything that causes you to cough at work or at home.  If  your cough is worse at night, try sleeping in a semi-upright position.  Avoid cigarette smoke. If you smoke, quit smoking. If you need help quitting, ask your health care provider.  Avoid caffeine.  Avoid alcohol.  Rest as needed.  Contact a health care provider if:  You have new symptoms.  You cough up pus.  Your cough does not get better after 2-3 weeks, or your cough gets worse.  You cannot control your cough with suppressant medicines and you are losing sleep.  You develop pain that is getting worse or pain that is not controlled with pain medicines.  You have a fever.  You have unexplained weight loss.  You have night sweats. Get help right away if:  You cough up blood.  You have difficulty breathing.  Your heartbeat is very fast. This information is not intended to replace advice given to you by your health care provider. Make sure you discuss any questions you have with your health care provider. Document Released: 06/25/2010 Document Revised: 06/04/2015 Document Reviewed: 03/05/2014 Elsevier Interactive Patient Education  2018 [employer]   Sore Throat A sore throat is pain, burning, irritation, or scratchiness in the throat. When you have a sore throat, you may feel pain or tenderness in your throat when you swallow or talk. Many things can cause a sore throat, including:  An infection.  Seasonal allergies.  Dryness in the air.  Irritants, such as smoke or pollution.  Gastroesophageal reflux disease (GERD).  A tumor.  A sore throat is often the first sign of another sickness. It may happen with other symptoms, such as coughing, sneezing, fever, and swollen neck glands. Most sore throats go away without medical treatment. Follow these instructions at home:  Take over-the-counter medicines only as told by your health care provider.  Drink enough fluids to keep your urine clear or pale yellow.  Rest as needed.  To help with pain,  try: ? Sipping warm liquids, such as broth, herbal tea, or warm water. ? Eating or drinking cold or frozen liquids, such as frozen ice pops. ? Gargling with a salt-water mixture 3-4 times a day or as needed. To make a salt-water mixture, completely dissolve -1 tsp of salt in 1 cup of warm water. ? Sucking on hard candy or throat lozenges. ? Putting a cool-mist humidifier in your bedroom at night to moisten the air. ? Sitting in the bathroom with the door closed for 5-10 minutes while you run hot water in the shower.  Do not use any tobacco products, such as cigarettes, chewing tobacco, and e-cigarettes. If you need help quitting, ask your health care provider. Contact a health care provider if:  You have a fever for more than 2-3 days.  You have symptoms that last (are persistent) for more than 2-3 days.  Your throat does not get better within 7 days.  You have a fever and your symptoms suddenly get worse. Get help right away if:  You have difficulty breathing.  You cannot swallow fluids, soft foods, or your saliva.  You have increased swelling in your throat or neck.  You have persistent nausea and vomiting. This information is not intended to replace advice given to you by your health care provider. Make sure you discuss any questions you have with your health care provider. Document Released: 02/04/2004 Document Revised: 08/23/2015 Document Reviewed: 10/17/2014 Elsevier Interactive Patient Education  [employer].

## 2017-11-08 NOTE — Progress Notes (Signed)
Chief Complaint  Patient presents with  . Follow-up  . Cough   F/u sick visit 10/31/17  She c/o sore throat and cough dry and productive and wheezing. Given tessalon perles w/o relief, prednisone taper which caused leg edema R>L and did not help much with sx's. CXR 10/31/17 showed bronchitis is has been a smoker now not smoked in 2 days but had smoked 2 cig/per day 2 weeks ago and was max 1.5 ppd for years. He did not use albuterol due to c/w side effects I.e elevating BP  Tobacco abuse he was on Chantix now off but helped reduced smoking to 2 cig per day   Left shoulder pain chronic since shoulder surgery 6 months ago 2+/10 and he completed PT but has pain dailly f/u [place] [facility]  HTN on cardura 2 mg qhs and Bystolic 20 mg qd BP uncontrolled though took them today Lisinopril caused swelling so could not take this   BPH on cardura and flomax   Review of Systems  Constitutional: Negative for fever and weight loss.  HENT: Positive for sore throat. Negative for hearing loss.   Eyes: Negative for blurred vision.  Respiratory: Positive for cough, sputum production and wheezing.   Cardiovascular: Negative for chest pain.  Skin: Negative for rash.   Past Medical History:  Diagnosis Date  . A-fib ([place])   . Abnormality of gait 09/03/2012  . Alcohol abuse   . Anxiety   . Arthritis   . Arthritis   . Esophageal stricture   . GERD (gastroesophageal reflux disease)   . Gout   . Hyperlipidemia   . Hypertension   . Internal hemorrhoids   . Lesion of ulnar nerve 09/03/2012   Right ulnar neuropathy  . Murmur, cardiac   . PAF (paroxysmal atrial fibrillation) ([place])   . Palpitations   . Prostate cancer ([place]) 2016   treated with radioactive seed implant  . Seizures ([place])    pt states he had seizure 12/14  . Tubular adenoma of colon 03/2008   Past Surgical History:  Procedure Laterality Date  . CATARACT EXTRACTION W/PHACO Right 07/03/2014   Procedure: CATARACT EXTRACTION PHACO AND  INTRAOCULAR LENS PLACEMENT (IOC);  Surgeon: Lyla Glassing, MD;  Location: [facility] ORS;  Service: Ophthalmology;  Laterality: Right;  lot # [id number] H [place]: 00:32.3 AP% 9.1 CDE: 2.92  . CIRCUMCISION    . COLONOSCOPY    . ELBOW SURGERY    . ELECTROPHYSIOLOGIC STUDY N/A 11/02/2015   Procedure: CARDIOVERSION;  Surgeon: Minna Merritts, MD;  Location: [facility] OR[facility];  Service: Cardiovascular;  Laterality: N/A;  . ESOPHAGOGASTRODUODENOSCOPY (EGD) WITH PROPOFOL N/A 05/17/2016   Procedure: ESOPHAGOGASTRODUODENOSCOPY (EGD) WITH PROPOFOL;  Surgeon: Lucilla Lame, MD;  Location: [facility] END[facility]COPY;  Service: Endoscopy;  Laterality: N/A;  . EYE SURGERY    . HAND SURGERY Left   . SHOULDER ARTHROSCOPY Left   . SHOULDER SURGERY     Family History  Problem Relation Age of Onset  . Diabetes Father   . Kidney disease Father   . Lung cancer Father        smoker  . Hyperlipidemia Father   . Hypertension Father   . Lung cancer Mother        smoke  . Hypertension Mother   . Colon cancer Neg Hx   . Pancreatic cancer Neg Hx   . Stomach cancer Neg Hx    Social History   Socioeconomic History  . Marital status: Married    Spouse name: Not on file  .  Number of children: 0  . Years of education: hs  . Highest education level: Not on file  Occupational History  . Occupation: Retired    Fish farm manager: Economist  . Occupation: UTILITIES OPER.    Employer: [employer]  Social Needs  . Financial resource strain: Not on file  . Food insecurity:    Worry: Not on file    Inability: Not on file  . Transportation needs:    Medical: Not on file    Non-medical: Not on file  Tobacco Use  . Smoking status: Current Every Day Smoker    Packs/day: 0.10    Years: 24.00    Pack years: 2.40    Types: Cigarettes  . Smokeless tobacco: Never Used  . Tobacco comment: down to 4-5  ciggs a day.  Substance and Sexual Activity  . Alcohol use: Yes    Alcohol/week: 2.0 standard drinks    Types: 2 Cans of beer per week    Frequency:  Never  . Drug use: No    Types: Marijuana    Comment: 1970's occ.   Marland Kitchen Sexual activity: Not on file  Lifestyle  . Physical activity:    Days per week: Not on file    Minutes per session: Not on file  . Stress: Not on file  Relationships  . Social connections:    Talks on phone: Not on file    Gets together: Not on file    Attends religious service: Not on file    Active member of club or organization: Not on file    Attends meetings of clubs or organizations: Not on file    Relationship status: Not on file  . Intimate partner violence:    Fear of current or ex partner: Not on file    Emotionally abused: Not on file    Physically abused: Not on file    Forced sexual activity: Not on file  Other Topics Concern  . Not on file  Social History Narrative   Lives with wife   Caffeine use: Drinks one cup coffee per morning   No soda or tea   No outpatient medications have been marked as taking for the 11/08/17 encounter (Office Visit) with McLean-Scocuzza, Nino Glow, MD.   Allergies  Allergen Reactions  . Clonazepam     Syncope  . Crestor [Rosuvastatin] Other (See Comments)    Bloating, swelling of legs, fatty liver  . Dust Mite Extract   . Gabapentin     Leg twitching at night, ineffective   . Hydrocodone Bitartrate   . Lipitor [Atorvastatin] Other (See Comments)    Myalgias  . Lisinopril Swelling  . Topamax [Topiramate]     Cognitive clouding   Recent Results (from the past 2160 hour(s))  Basic metabolic panel     Status: Abnormal   Collection Time: 08/26/17  2:45 PM  Result Value Ref Range   Sodium 140 135 - 145 mmol/L   Potassium 4.3 3.5 - 5.1 mmol/L   Chloride 105 98 - 111 mmol/L   CO2 25 22 - 32 mmol/L   Glucose, Bld 113 (H) 70 - 99 mg/dL   BUN 12 8 - 23 mg/dL   Creatinine, Ser 0.74 0.61 - 1.24 mg/dL   Calcium 9.3 8.9 - 10.3 mg/dL   GFR calc non Af Amer >60 >60 mL/min   GFR calc Af Amer >60 >60 mL/min    Comment: (NOTE) The eGFR has been calculated using the  CKD EPI equation. This  calculation has not been validated in all clinical situations. eGFR's persistently <60 mL/min signify possible Chronic Kidney Disease.    Anion gap 10 5 - 15    Comment: Performed at [facility], [address] [place], [place] [address]  CBC     Status: Abnormal   Collection Time: 08/26/17  2:45 PM  Result Value Ref Range   WBC 7.4 3.8 - 10.6 K/uL   RBC 3.46 (L) 4.40 - 5.90 MIL/uL   Hemoglobin 13.4 13.0 - 18.0 g/dL    Comment: RESULT REPEATED AND VERIFIED   HCT 37.7 (L) 40.0 - 52.0 %   MCV 109.1 (H) 80.0 - 100.0 fL   MCH 38.7 (H) 26.0 - 34.0 pg   MCHC 35.5 32.0 - 36.0 g/dL   RDW 14.9 (H) 11.5 - 14.5 %   Platelets 66 (L) 150 - 440 K/uL    Comment: Performed at [facility], [address] [place], [place] [address]  Troponin I     Status: Abnormal   Collection Time: 08/26/17  2:45 PM  Result Value Ref Range   Troponin I 0.04 (HH) <0.03 ng/mL    Comment: CRITICAL RESULT CALLED TO, READ BACK BY AND VERIFIED WITH KAYLA CUMMINGS 08/26/17 @ 1523  [place] Performed at [facility], [address] [place], [place] [address]   Troponin I     Status: Abnormal   Collection Time: 08/26/17  4:47 PM  Result Value Ref Range   Troponin I 0.05 (HH) <0.03 ng/mL    Comment: CRITICAL VALUE NOTED. VALUE IS CONSISTENT WITH PREVIOUSLY REPORTED/CALLED VALUE / [place] Performed at [facility], [address] [place], [place] [address]   Lipid Panel w/o Chol/HDL Ratio     Status: None   Collection Time: 09/18/17  9:20 AM  Result Value Ref Range   Cholesterol, Total 147 100 - 199 mg/dL   Triglycerides 132 0 - 149 mg/dL   HDL 65 >39 mg/dL   VLDL Cholesterol Cal 26 5 - 40 mg/dL   LDL Calculated 56 0 - 99 mg/dL  Hepatic function panel     Status: Abnormal   Collection Time: 09/18/17  9:20 AM  Result Value Ref Range   Total Protein 6.7 6.0 - 8.5 g/dL   Albumin 3.7 3.6 - 4.8 g/dL   Bilirubin Total 3.0 (H) 0.0 - 1.2 mg/dL   Bilirubin,  Direct 0.94 (H) 0.00 - 0.40 mg/dL   Alkaline Phosphatase 121 (H) 39 - 117 IU/L   AST 75 (H) 0 - 40 IU/L   ALT 38 0 - 44 IU/L  Specimen status report     Status: None   Collection Time: 09/18/17  9:20 AM  Result Value Ref Range   specimen status report Comment     Comment: Ambig Abbrev LP Default Ambig Abbrev LP Default A hand-written panel/profile was received from your office. In accordance with the [facility] Ambiguous Test Code Policy dated July 8811, we have completed your order by using the closest currently or formerly recognized AMA panel.  We have assigned Lipid Panel, Test Code [phone] to this request. If this is not the testing you wished to receive on this specimen, please contact the [facility] Client Inquiry/Technical Services Department to clarify the test order.  We appreciate your business. Isac Caddy HFP7 Default Isac Caddy HFP7 Default A hand-written panel/profile was received from your office. In accordance with the [facility] Ambiguous Test Code Policy dated July 5859, we have completed your order by using the  closest currently or formerly recognized AMA panel.  We have assigned Hepatic Function Panel (7), Test Code [phone] to this request.  If this is not the testing you wished to receive on this specimen, please contac t the [place] Client Inquiry/Technical Services Department to clarify the test order.  We appreciate your business.   B12 and Folate Panel     Status: Abnormal   Collection Time: 09/18/17  9:20 AM  Result Value Ref Range   Vitamin B-12 >2000 (H) 232 - 1245 pg/mL   Folate >20.0 >3.0 ng/mL    Comment: A serum folate concentration of less than 3.1 ng/mL is considered to represent clinical deficiency.   Specimen status report     Status: None   Collection Time: 09/18/17  9:20 AM  Result Value Ref Range   specimen status report Comment     Comment: Written Authorization Written Authorization Written Authorization Received. Authorization received  from [facility] 10-12-2017 Logged by Lenice Llamas   Compliance Drug Analysis, Ur     Status: None   Collection Time: 09/28/17  2:56 PM  Result Value Ref Range   Summary FINAL     Comment: ==================================================================== [facility] COMP DRUG ANALYSIS,UR ==================================================================== Test                             Result       Flag       Units Drug Present and Declared for Prescription Verification   Tramadol                       2855         EXPECTED   ng/mg creat   O-Desmethyltramadol            1242         EXPECTED   ng/mg creat   N-Desmethyltramadol            1138         EXPECTED   ng/mg creat    Source of tramadol is a prescription medication.    O-desmethyltramadol and N-desmethyltramadol are expected    metabolites of tramadol. Drug Present not Declared for Prescription Verification   Alcohol, Ethyl                 0.075        UNEXPECTED g/dL    Sources of ethyl alcohol include alcoholic beverages or as a    fermentation product of glucose; glucose was not detected in this    specimen. Ethyl alcohol result should be interpreted in the    context of all available clinic al and behavioral information. Drug Absent but Declared for Prescription Verification   Diclofenac                     Not Detected UNEXPECTED    Diclofenac, as indicated in the declared medication list, is not    always detected even when used as directed. ==================================================================== Test                      Result    Flag   Units      Ref Range   Creatinine              66               mg/dL      >=20 ==================================================================== Declared Medications:  The flagging and interpretation on this report are based on the  following declared medications.  Unexpected results may arise from  inaccuracies in the declared medications.  **Note: The  testing scope of this panel includes these medications:  Tramadol ([place])  **Note: The testing scope of this panel does not include small to  moderate amounts of these reported medications:  Diclofenac (Voltaren)  **Note: The testing scope of this pan el does not include following  reported medications:  Alirocumab  Apixaban  Buspirone (BuSpar)  Doxazosin (Cardura)  Melatonin  Multivitamin  Nebivolol (Bystolic)  Nitroglycerin (Nitrostat)  Supplement (MilkThistle)  Tamsulosin (Flomax)  Triamcinolone (Kenalog)  Vitamin B  Vitamin C ==================================================================== For clinical consultation, please call [phone]. ====================================================================   Comp. Metabolic Panel (12)     Status: Abnormal   Collection Time: 10/02/17 11:03 AM  Result Value Ref Range   Glucose 129 (H) 65 - 99 mg/dL   BUN 15 8 - 27 mg/dL   Creatinine, Ser 0.75 (L) 0.76 - 1.27 mg/dL   GFR calc non Af Amer 99 >59 mL/min/1.73   GFR calc Af Amer 114 >59 mL/min/1.73   BUN/Creatinine Ratio 20 10 - 24   Sodium 140 134 - 144 mmol/L   Potassium 4.3 3.5 - 5.2 mmol/L   Chloride 100 96 - 106 mmol/L   Calcium 9.0 8.6 - 10.2 mg/dL   Total Protein 6.8 6.0 - 8.5 g/dL   Albumin 3.7 3.6 - 4.8 g/dL   Globulin, Total 3.1 1.5 - 4.5 g/dL   Albumin/Globulin Ratio 1.2 1.2 - 2.2   Bilirubin Total 1.7 (H) 0.0 - 1.2 mg/dL   Alkaline Phosphatase 149 (H) 39 - 117 IU/L   AST 52 (H) 0 - 40 IU/L  Magnesium     Status: Abnormal   Collection Time: 10/02/17 11:03 AM  Result Value Ref Range   Magnesium 1.3 (L) 1.6 - 2.3 mg/dL  Vitamin B12     Status: Abnormal   Collection Time: 10/02/17 11:03 AM  Result Value Ref Range   Vitamin B-12 >2000 (H) 232 - 1245 pg/mL  Sedimentation rate     Status: None   Collection Time: 10/02/17 11:03 AM  Result Value Ref Range   Sed Rate 13 0 - 30 mm/hr  25-Hydroxyvitamin D Lcms D2+D3     Status: None   Collection Time:  10/02/17 11:03 AM  Result Value Ref Range   25-Hydroxy, Vitamin D 44 ng/mL    Comment: Reference Range: All Ages: Target levels 30 - 100    25-Hydroxy, Vitamin D-2 <1.0 ng/mL   25-Hydroxy, Vitamin D-3 44 ng/mL  C-reactive protein     Status: None   Collection Time: 10/02/17 11:03 AM  Result Value Ref Range   CRP 5 0 - 10 mg/L  POCT rapid strep A     Status: Normal   Collection Time: 10/31/17 11:49 AM  Result Value Ref Range   Rapid Strep A Screen Negative Negative  POC Influenza A&B(BINAX/QUICKVUE)     Status: Normal   Collection Time: 10/31/17 11:50 AM  Result Value Ref Range   Influenza A, POC Negative Negative   Influenza B, POC Negative Negative  Upper Respiratory Culture     Status: None   Collection Time: 10/31/17 11:55 AM  Result Value Ref Range   MICRO NUMBER: [id number]    SPECIMEN QUALITY: ADEQUATE    Source NOT GIVEN    STATUS: FINAL    Result: No oropharyngeal pathogens recovered.    Objective  Body mass index is 35.51 kg/m. Wt Readings from Last 3 Encounters:  11/08/17 261 lb 12.8 oz (118.8 kg)  10/31/17 247 lb (112 kg)  10/18/17 238 lb (108 kg)   Temp Readings from Last 3 Encounters:  11/08/17 98.3 F (36.8 C) (Oral)  10/31/17 97.9 F (36.6 C) (Oral)  10/18/17 98.4 F (36.9 C)   BP Readings from Last 3 Encounters:  11/08/17 (!) 170/74  10/31/17 (!) 148/78  10/18/17 (!) 180/76   Pulse Readings from Last 3 Encounters:  11/08/17 69  10/31/17 77  10/18/17 64    Physical Exam  Constitutional: He is oriented to person, place, and time. Vital signs are normal. He appears well-developed and well-nourished. He is cooperative.  HENT:  Head: Normocephalic and atraumatic.  Mouth/Throat: Mucous membranes are normal. Posterior oropharyngeal erythema present.  Eyes: Pupils are equal, round, and reactive to light. Conjunctivae are normal.  Cardiovascular: Normal rate, regular rhythm and normal heart sounds.  Pulmonary/Chest: Effort normal and breath sounds  normal. He has no wheezes.  Cough on exam   Neurological: He is alert and oriented to person, place, and time. Gait normal.  Skin: Skin is warm, dry and intact.  Psychiatric: He has a normal mood and affect. His speech is normal and behavior is normal. Judgment and thought content normal. Cognition and memory are normal.  Nursing note and vitals reviewed. 1-2 + leg edema b/l R>L   Assessment   1. Bronchitis and sore throat not improving c/w bacterial etiology instead of viral since not improving 2. Tobacco abuse  3. HTN uncontrolled, CAD, h/o premature beats  4. Left shoulder pain chronic s/p surgery  5. BPH, h/o prostate cancer s/p radioactive seeds  Plan  1. duoneb x 1 today  zpack x 5 days Cold handout given with supportive care rec warm salt water gargles, mucinex dm or robitussin dm  F/u with PCP in 10-14 days  2. rec cessation cut back to 2 cig per day  3. Add hctz 12.5 mg qam to cardura  47m qhs and bystolic 20 mg qd  4. F/u [facility]  5. On flomax and cardura  Provider: Dr. TOlivia MackieMcLean-Scocuzza-Internal Medicine

## 2017-11-10 ENCOUNTER — Ambulatory Visit (INDEPENDENT_AMBULATORY_CARE_PROVIDER_SITE_OTHER): Payer: 59 | Admitting: Orthopaedic Surgery

## 2017-11-10 ENCOUNTER — Encounter (INDEPENDENT_AMBULATORY_CARE_PROVIDER_SITE_OTHER): Payer: Self-pay | Admitting: Orthopaedic Surgery

## 2017-11-10 VITALS — BP 193/69 | HR 58 | Ht 72.0 in | Wt 252.0 lb

## 2017-11-10 DIAGNOSIS — M5442 Lumbago with sciatica, left side: Secondary | ICD-10-CM

## 2017-11-10 DIAGNOSIS — M5441 Lumbago with sciatica, right side: Secondary | ICD-10-CM

## 2017-11-10 DIAGNOSIS — G8929 Other chronic pain: Secondary | ICD-10-CM

## 2017-11-10 DIAGNOSIS — M25512 Pain in left shoulder: Secondary | ICD-10-CM

## 2017-11-10 MED ORDER — LIDOCAINE HCL 2 % IJ SOLN
2.0000 mL | INTRAMUSCULAR | Status: AC | PRN
  Administered 2017-11-10: 2 mL

## 2017-11-10 MED ORDER — BUPIVACAINE HCL 0.5 % IJ SOLN
2.0000 mL | INTRAMUSCULAR | Status: AC | PRN
  Administered 2017-11-10: 2 mL via INTRA_ARTICULAR

## 2017-11-10 MED ORDER — METHYLPREDNISOLONE ACETATE 40 MG/ML IJ SUSP
80.0000 mg | INTRAMUSCULAR | Status: AC | PRN
  Administered 2017-11-10: 80 mg

## 2017-11-10 NOTE — Progress Notes (Signed)
Office Visit Note   Patient: Peter [name]           Date of Birth: Jun 09, 1955           MRN: [id number] Visit Date: 11/10/2017              Requested by: [name], MD [address] [place],  [address] PCP: [name], MD   Assessment & Plan: Visit Diagnoses:  1. Chronic bilateral low back pain with bilateral sciatica   2. Chronic left shoulder pain     Plan: Recurrent impingement syndrome left shoulder over 4 months status post arthroscopic SCD and DCR.  Will inject with cortisone.  Other significant issue is current chronic low back pain.  [place] would like another opinion from Dr. Ernestina Patches.  Will make the referral  Follow-Up Instructions: Return if symptoms worsen or fail to improve.   Orders:  Orders Placed This Encounter  Procedures  . Large Joint Inj: L subacromial bursa  . Ambulatory referral to Physical Medicine Rehab   No orders of the defined types were placed in this encounter.     Procedures: Large Joint Inj: L subacromial bursa on 11/10/2017 10:29 AM Indications: pain and diagnostic evaluation Details: 25 G 1.5 in needle, anterolateral approach  Arthrogram: No  Medications: 2 mL lidocaine 2 %; 2 mL bupivacaine 0.5 %; 80 mg methylPREDNISolone acetate 40 MG/ML Consent was given by the patient. Immediately prior to procedure a time out was called to verify the correct patient, procedure, equipment, support staff and site/side marked as required. Patient was prepped and draped in the usual sterile fashion.       Clinical Data: No additional findings.   Subjective: Chief Complaint  Patient presents with  . Follow-up    L SHOULDER PAIN POSSIBLE INJECTION, DISCUSS LUMBAR FACET NERVE BLOCK WITH  DR. Dossie Arbour. PT SEE DR Ernestina Patches BUT PT IS ON BLOOD THINNERS SO CANT GET INJECTIONS W [place]  [name] is 5 months status post arthroscopic SCD and DCR and debridement of the labral tear of his left shoulder.  He actually is feeling  much better compared to his preoperative status.  However he has had some recurrent discomfort in the anterior subacromial region would like a cortisone injection.  He has another issue with chronic low back pain.  He is been seeing a pain who has suggested a specific nerve injection to his back he has seen Dr. Ernestina Patches in the past and would like to have another opinion for pain to his lower extremities.  He has gained some weight because of his chronic pain.  He is taking tramadol up to twice a day per his primary care physician  HPI  Review of Systems  Constitutional: Negative for fatigue and fever.  HENT: Negative for ear pain.   Eyes: Negative for pain.  Respiratory: Positive for cough. Negative for shortness of breath.   Cardiovascular: Negative for leg swelling.  Gastrointestinal: Negative for constipation and diarrhea.  Genitourinary: Negative for difficulty urinating.  Musculoskeletal: Positive for back pain and neck pain.  Skin: Negative for rash.  Allergic/Immunologic: Negative for food allergies.  Neurological: Positive for weakness. Negative for numbness.  Hematological: Bruises/bleeds easily.  Psychiatric/Behavioral: Positive for sleep disturbance.     Objective: Vital Signs: BP (!) 193/69 (BP Location: Right Arm, Patient Position: Sitting, Cuff Size: Normal)   Pulse (!) 58   Ht 6' (1.829 m)   Wt 252 lb (114.3 kg)   BMI 34.18 kg/m  Physical Exam  Constitutional: He is oriented to person, place, and time. He appears well-developed and well-nourished.  HENT:  Mouth/Throat: Oropharynx is clear and moist.  Eyes: Pupils are equal, round, and reactive to light. EOM are normal.  Pulmonary/Chest: Effort normal.  Neurological: He is alert and oriented to person, place, and time.  Skin: Skin is warm and dry.  Psychiatric: He has a normal mood and affect. His behavior is normal.    Ortho Exam awake alert and oriented x3.  Comfortable sitting.  Some mild percussible  tenderness lumbar spine but straight leg raise is negative.  Left shoulder with mild impingement on the extremes of internal/external some mild popping but no pain with that "feeling".  Some pain along the anterior subacromial region.  Mild pain at the acromioclavicular joint.  Negative crossarm test.  Biceps intact.  Skin intact.  Good grip and good's Specialty Comments:  No specialty comments available.  Imaging: No results found.   PMFS History: Patient Active Problem List   Diagnosis Date Noted  . BPH (benign prostatic hyperplasia) 11/08/2017  . Rheumatoid factor positive 10/18/2017  . Hypomagnesemia 10/18/2017  . Chronic anticoagulation (ELIQUIS) 10/18/2017  . Decreased shoulder range of motion (ROM) (Left) 10/18/2017  . Tendinopathy of rotator cuff (Left) 10/18/2017  . Labral tear of shoulder, sequela (Left) 10/18/2017  . Strain of subscapularis muscle, sequela (Left) 10/18/2017  . Chronic knee pain (Right) 10/18/2017  . Arthropathy of knee (Right) 10/18/2017  . Enthesopathy of knee region (Right) 10/18/2017  . Osteoarthritis of knee (Right) 10/18/2017  . Lumbar spondylosis 10/18/2017  . DDD (degenerative disc disease), lumbar 10/18/2017  . Lumbar facet arthropathy 10/18/2017  . Lumbar facet syndrome 10/18/2017  . Lumbar lateral recess stenosis (Left: L3-4, L4-5) 10/18/2017  . Lumbar central spinal stenosis (L2-3) 10/18/2017  . Lumbar foraminal stenosis 10/18/2017  . History of esophageal stricture 10/18/2017  . Chronic low back pain (Primary Area of Pain) (Bilateral) (L>R) w/ sciatica (Bilateral) 09/28/2017  . Chronic lower extremity pain (Secondary Area of Pain) (Bilateral) (L>R) 09/28/2017  . Chronic pain syndrome 09/28/2017  . Long term current use of opiate analgesic 09/28/2017  . Pharmacologic therapy 09/28/2017  . Disorder of skeletal system 09/28/2017  . Problems influencing health status 09/28/2017  . Chronic thoracic back pain [facility] Area of Pain) ([place])  09/28/2017  . Atypical chest pain 09/12/2017  . Chronic shoulder pain (Left) 06/12/2017  . Allergic rhinitis 04/24/2017  . Thrombocytopenia ([place]) 01/23/2017  . Polypharmacy 01/20/2017  . Syncope 01/19/2017  . Elevated troponin 01/19/2017  . Sinusitis 06/08/2016  . Tremor 03/23/2016  . Paresthesia 03/23/2016  . Anxiety 02/23/2016  . Typical atrial flutter ([place]) 09/21/2015  . CAD (coronary artery disease) 05/06/2015  . Aortic atherosclerosis ([place]) 03/20/2015  . History of alcohol abuse 02/04/2015  . Alcohol induced fatty liver 02/04/2015  . Obesity (BMI 30.0-34.9) 02/04/2015  . History of prostate cancer 02/04/2015  . Chronic venous insufficiency 10/12/2012  . Lesion of ulnar nerve 09/03/2012  . Tobacco abuse 04/05/2011  . Hyperlipidemia 01/21/2010  . Essential hypertension 12/16/2009  . Premature beats 12/16/2009   Past Medical History:  Diagnosis Date  . A-fib ([place])   . Abnormality of gait 09/03/2012  . Alcohol abuse   . Anxiety   . Arthritis   . Arthritis   . Esophageal stricture   . GERD (gastroesophageal reflux disease)   . Gout   . Hyperlipidemia   . Hypertension   . Internal hemorrhoids   . Lesion of ulnar nerve  09/03/2012   Right ulnar neuropathy  . Murmur, cardiac   . PAF (paroxysmal atrial fibrillation) ([place])   . Palpitations   . Prostate cancer ([place]) 2016   treated with radioactive seed implant  . Seizures ([place])    pt states he had seizure 12/14  . Tubular adenoma of colon 03/2008    Family History  Problem Relation Age of Onset  . Diabetes Father   . Kidney disease Father   . Lung cancer Father        smoker  . Hyperlipidemia Father   . Hypertension Father   . Lung cancer Mother        smoke  . Hypertension Mother   . Colon cancer Neg Hx   . Pancreatic cancer Neg Hx   . Stomach cancer Neg Hx     Past Surgical History:  Procedure Laterality Date  . CATARACT EXTRACTION W/PHACO Right 07/03/2014   Procedure: CATARACT EXTRACTION PHACO AND  INTRAOCULAR LENS PLACEMENT (IOC);  Surgeon: Lyla Glassing, MD;  Location: [facility] ORS;  Service: Ophthalmology;  Laterality: Right;  lot # [id number] H [place]: 00:32.3 AP% 9.1 CDE: 2.92  . CIRCUMCISION    . COLONOSCOPY    . ELBOW SURGERY    . ELECTROPHYSIOLOGIC STUDY N/A 11/02/2015   Procedure: CARDIOVERSION;  Surgeon: Minna Merritts, MD;  Location: [facility] ORS;  Service: Cardiovascular;  Laterality: N/A;  . ESOPHAGOGASTRODUODENOSCOPY (EGD) WITH PROPOFOL N/A 05/17/2016   Procedure: ESOPHAGOGASTRODUODENOSCOPY (EGD) WITH PROPOFOL;  Surgeon: Lucilla Lame, MD;  Location: [facility] ENDOSCOPY;  Service: Endoscopy;  Laterality: N/A;  . EYE SURGERY    . HAND SURGERY Left   . SHOULDER ARTHROSCOPY Left   . SHOULDER SURGERY     Social History   Occupational History  . Occupation: Retired    Fish farm manager: Economist  . Occupation: UTILITIES OPER.    Employer: [employer]  Tobacco Use  . Smoking status: Current Some Day Smoker    Packs/day: 0.10    Years: 24.00    Pack years: 2.40    Types: Cigarettes  . Smokeless tobacco: Never Used  . Tobacco comment: down to 4-5  ciggs a day.  Substance and Sexual Activity  . Alcohol use: Yes    Alcohol/week: 2.0 standard drinks    Types: 2 Cans of beer per week    Frequency: Never  . Drug use: No    Types: Marijuana    Comment: 1970's occ.   Marland Kitchen Sexual activity: Not on file

## 2017-11-13 ENCOUNTER — Other Ambulatory Visit: Payer: Self-pay | Admitting: Internal Medicine

## 2017-11-13 DIAGNOSIS — J029 Acute pharyngitis, unspecified: Secondary | ICD-10-CM

## 2017-11-13 DIAGNOSIS — J40 Bronchitis, not specified as acute or chronic: Secondary | ICD-10-CM

## 2017-11-13 DIAGNOSIS — J440 Chronic obstructive pulmonary disease with acute lower respiratory infection: Secondary | ICD-10-CM

## 2017-11-13 DIAGNOSIS — J209 Acute bronchitis, unspecified: Secondary | ICD-10-CM

## 2017-11-13 MED ORDER — LEVOFLOXACIN 750 MG PO TABS: 750.0000 mg | ORAL_TABLET | Freq: Every day | ORAL | 0 refills | Status: DC

## 2017-11-13 NOTE — Telephone Encounter (Signed)
Sent Levaquin 750 mg x 5 days with food if not better I rec CT scan of his chest  Call back if not better  Also rec he use albuterol inhaler as needed 1-2 puffs every 4-6 hours   [place]

## 2017-11-13 NOTE — Telephone Encounter (Signed)
Patient till having sx.

## 2017-11-20 DIAGNOSIS — N401 Enlarged prostate with lower urinary tract symptoms: Secondary | ICD-10-CM | POA: Diagnosis not present

## 2017-11-20 DIAGNOSIS — C61 Malignant neoplasm of prostate: Secondary | ICD-10-CM | POA: Diagnosis not present

## 2017-11-20 DIAGNOSIS — J301 Allergic rhinitis due to pollen: Secondary | ICD-10-CM | POA: Diagnosis not present

## 2017-11-21 DIAGNOSIS — C61 Malignant neoplasm of prostate: Secondary | ICD-10-CM | POA: Diagnosis not present

## 2017-11-21 DIAGNOSIS — Z125 Encounter for screening for malignant neoplasm of prostate: Secondary | ICD-10-CM | POA: Diagnosis not present

## 2017-11-23 ENCOUNTER — Telehealth: Payer: Self-pay | Admitting: Cardiovascular Disease

## 2017-11-23 NOTE — Telephone Encounter (Signed)
Pt c/o medication issue:  1. Name of Medication: doxazosin   2. How are you currently taking this medication (dosage and times per day)? 8 mg po q day   3. Are you having a reaction (difficulty breathing--STAT)? No   4. What is your medication issue? Old rx for 2 mg po q day but patient needs 8 mg .  Unable to get refills as old rx is too soon .  Please advise if you can change rx

## 2017-11-23 NOTE — Telephone Encounter (Signed)
Spoke with patient and states that he is taking doxazosin 8 mg daily. He was previously on 8 mg and then it was decreased back down to 2 mg then he noticed his blood pressures were running high again so he went back to 8 mg taking 4 tablets of his Doxazosin 2 mg. Because he is taking the higher dose his prescription is running out and needs updated prescription sent in before insurance will let him get any new. Advised that I would check with provider and if agreeable then I would let him know the outcome and send in updated script. He was appreciative for the call with no further questions at this time.

## 2017-11-26 NOTE — Telephone Encounter (Signed)
Okay to send in Cardura 8 mg daily He can take this four mg twice daily Could take 8 mg daily if this does not drop blood pressure low at one time a day and high it the other time of day

## 2017-11-27 ENCOUNTER — Telehealth: Payer: Self-pay

## 2017-11-27 MED ORDER — DOXAZOSIN MESYLATE 4 MG PO TABS: 4.0000 mg | ORAL_TABLET | Freq: Two times a day (BID) | ORAL | 2 refills | Status: DC

## 2017-11-27 NOTE — Telephone Encounter (Signed)
Spoke with patient. Verbalized understanding of Dr. Gwenyth Ober request. Pt taking Cardura 4 mg tablets 2 x daily. He reports that BP has not been controlled with previous prescription of 2 mg once daily. Last BP on 4 mg twice daily is 130s/80s.  Pt denies any other sx or concerns. E-rx sent to [facility] per pt request and MD okay.

## 2017-11-27 NOTE — Telephone Encounter (Signed)
Let me know if they reply from the office visit notes.

## 2017-11-27 NOTE — Telephone Encounter (Signed)
Denied today Request Reference Number: [id number]. PRALUENT INJ 150MG /ML is denied for not meeting the prior authorization requirement(s).  For further questions, call [phone].  Appeals are not supported through [place].  Please refer to the fax case notice for appeals information and instructions.  I have faxed most recent office note and recent labs for an appeal.

## 2017-11-27 NOTE — Telephone Encounter (Signed)
Prior Auth Completed- Covermymeds Awaiting Response Key: [id number] - [place] Case ID: [id number]

## 2017-11-28 ENCOUNTER — Ambulatory Visit (INDEPENDENT_AMBULATORY_CARE_PROVIDER_SITE_OTHER): Payer: Self-pay

## 2017-11-28 ENCOUNTER — Telehealth (INDEPENDENT_AMBULATORY_CARE_PROVIDER_SITE_OTHER): Payer: Self-pay | Admitting: Physical Medicine and Rehabilitation

## 2017-11-28 ENCOUNTER — Encounter (INDEPENDENT_AMBULATORY_CARE_PROVIDER_SITE_OTHER): Payer: Self-pay | Admitting: Physical Medicine and Rehabilitation

## 2017-11-28 ENCOUNTER — Ambulatory Visit (INDEPENDENT_AMBULATORY_CARE_PROVIDER_SITE_OTHER): Payer: 59 | Admitting: Physical Medicine and Rehabilitation

## 2017-11-28 VITALS — BP 154/77 | HR 54 | Temp 97.7°F

## 2017-11-28 DIAGNOSIS — M545 Low back pain: Secondary | ICD-10-CM

## 2017-11-28 DIAGNOSIS — M47816 Spondylosis without myelopathy or radiculopathy, lumbar region: Secondary | ICD-10-CM | POA: Diagnosis not present

## 2017-11-28 DIAGNOSIS — G8929 Other chronic pain: Secondary | ICD-10-CM | POA: Diagnosis not present

## 2017-11-28 MED ORDER — METHYLPREDNISOLONE ACETATE 80 MG/ML IJ SUSP
80.0000 mg | Freq: Once | INTRAMUSCULAR | Status: AC
  Administered 2017-11-28: 80 mg

## 2017-11-28 NOTE — Patient Instructions (Signed)

## 2017-11-28 NOTE — Telephone Encounter (Signed)
Thank you for your online Notification/Prior Authorization submission.  The notification/prior authorization case information was transmitted on 11/28/2017 at 2:06 PM CST. The notification/prior authorization reference number is [id number].  Case is pending.

## 2017-11-28 NOTE — Progress Notes (Signed)
 .  Numeric Pain Rating Scale and Functional Assessment Average Pain 2   In the last MONTH (on 0-10 scale) has pain interfered with the following?  1. General activity like being  able to carry out your everyday physical activities such as walking, climbing stairs, carrying groceries, or moving a chair?  Rating(7)   +Driver, +BT(eliquis, pt states cardiologist told him to stop BT 3 days prior to today), -Dye Allergies.

## 2017-11-29 ENCOUNTER — Other Ambulatory Visit: Payer: Self-pay | Admitting: Cardiovascular Disease

## 2017-11-29 NOTE — Telephone Encounter (Signed)
The requested medication is not available for refill. Praulant 150 mg.Please advise.

## 2017-11-29 NOTE — Telephone Encounter (Signed)
°*  STAT* If patient is at the pharmacy, call can be transferred to refill team.   1. Which medications need to be refilled? (please list name of each medication and dose if known) Praluent 150 MG   2. Which pharmacy/location (including street and city if local pharmacy) is medication to be sent to? [facility] - [phone]  rx # [id number]  2. Do they need a 30 day or 90 day supply? 90 day

## 2017-11-30 NOTE — Telephone Encounter (Signed)
Received Authorization via [facility] website.  Authorization # [id number]

## 2017-12-01 ENCOUNTER — Telehealth (INDEPENDENT_AMBULATORY_CARE_PROVIDER_SITE_OTHER): Payer: Self-pay | Admitting: Radiology

## 2017-12-01 NOTE — Telephone Encounter (Signed)
Patient sleeping so spoke with patient's wife, ok per DPR.  States patient's insurance denied the Praluent because his numbers were fine. He has enough for one more dose upcoming.  Advised I will route to our pharmacy for further advice and assistance in next steps. She was appreciative.

## 2017-12-01 NOTE — Telephone Encounter (Signed)
It is not from the spine shot but could be an effect of the steroid/cortisone, needs to hydrate, try tonic water from grocery store and stretching calves. If steroid/cortisone effect should diminish, he was very tense with injection and could be muscle soreness from tightening up

## 2017-12-01 NOTE — Telephone Encounter (Signed)
Patient stated he is already feeling much better took hot shower helped ease soreness.

## 2017-12-01 NOTE — Telephone Encounter (Signed)
Patient called requesting advise. States received injection on Tuesday, everything has been fine. Last 24hrs patient complains of bilateral calf soreness and now thigh soreness. States muscles feel tight. Patient asking is this normal and what can he do?  Call back [phone]

## 2017-12-04 ENCOUNTER — Other Ambulatory Visit: Payer: Self-pay | Admitting: Family Medicine

## 2017-12-04 NOTE — Telephone Encounter (Signed)
Controlled substance database reviewed. Sent to pharmacy.   

## 2017-12-04 NOTE — Telephone Encounter (Signed)
Spoke with Anderson Malta from [facility]  regarding prior auth denial.  She stated she would fax a prior auth paper to [place] and to fax it back to her at [phone] with all the clinical information that is needed for the prior auth. I have faxed the last two office notes and recent labs.   Awaiting response.

## 2017-12-04 NOTE — Telephone Encounter (Signed)
An appeals will need to be done. It does appear that [place] did start this process. Usually these take several weeks to resolve. Would recommend sampling patient until resolution. Let me know if you need assistance with the appeal process.

## 2017-12-04 NOTE — Telephone Encounter (Signed)
This was routed to [employer] - sending to [facility] for pharmacist review.

## 2017-12-05 NOTE — Telephone Encounter (Signed)
Called and s/w patient's wife. Let her know the appeal has been submitted and it may take 2-3 weeks for it to process. Until we can resolve this, we can provide samples. Patient has one more dose at home which is due today. She will let the patient know to come by and pick up some samples for the next 2 doses. Then they will call us before the 3rd dose to see where we are in the process and receive more samples if needed.  She was very Patent attorney.   Medication Samples have been provided to the patient.  Drug name: Praluent       Strength: 150mg /ml        Qty: 2 injections  LOT: [id number]  Exp.Date: 03/10/2018

## 2017-12-05 NOTE — Telephone Encounter (Signed)
Patient notified that approval was received and was very appreciative.

## 2017-12-05 NOTE — Telephone Encounter (Signed)
Received fax today from [facility] that Praluent injectable 150mg  Approved through 12/05/2018.  Will place approval letter in filing cabinet in med closet

## 2017-12-13 ENCOUNTER — Telehealth: Payer: Self-pay | Admitting: *Deleted

## 2017-12-13 NOTE — Telephone Encounter (Signed)
Called patient's house today and spoke with him about whether or not he need an appointment here with Dr. Janese Banks.  I explained to the patient that we had gotten a request from Dr. Dossie Arbour to see you due to your hemoglobin and platelets.  He was planning on doing a procedure and wanted your light lids and hemoglobin checked and gotten better before he did do a procedure on him.  The patient states that he just really did not care for Dr. Dossie Arbour.  He already has an orthopedic doctor.  He went to the orthopedic doctor in [place] and they have done some injections in his back and he is feeling much better.  He gets his lab work done at [facility] for free.  His doctors have been monitoring his hemoglobin and platelets.  The patient states he never had platelet issues until he started on the Eliquis from Dr. Rockey Situ.  His wife wanted to know whether or not being on the Eliquis can cause his platelets to be low.  I told the patient that when I looked it up on up-to-date it was a less than 1% chance of having low platelets while being on Eliquis.  He also asked me about folic acid.  Dr. Janese Banks had given him a prescription for folic acid when he was here at the last visit and told him to follow-up with his primary care for the future of the folic acid whether or not he needs it.  He has an appointment he states in January to see Dr. Caryl Bis and will ask Dr. Biagio Quint at that time if he needs to continue folic acid.  He has a friend that is a Software engineer and the pharmacist had told him to go up on the folic acid.  I told him he should talk to Dr. Biagio Quint and get another folic acid level so he can determine whether or not he needs to continue to be on the pill.  Patient states at this time he does not need an appointment here because there is other doctors that monitor his labs.  I told him that in the future if he needs anything he is welcome to call us.  He took down my direct phone number in case he needs me in the future

## 2017-12-14 ENCOUNTER — Ambulatory Visit (INDEPENDENT_AMBULATORY_CARE_PROVIDER_SITE_OTHER): Payer: 59 | Admitting: Physical Medicine and Rehabilitation

## 2017-12-14 ENCOUNTER — Encounter (INDEPENDENT_AMBULATORY_CARE_PROVIDER_SITE_OTHER): Payer: Self-pay | Admitting: Physical Medicine and Rehabilitation

## 2017-12-14 ENCOUNTER — Ambulatory Visit (INDEPENDENT_AMBULATORY_CARE_PROVIDER_SITE_OTHER): Payer: Self-pay

## 2017-12-14 VITALS — BP 155/66 | HR 60 | Temp 98.1°F

## 2017-12-14 DIAGNOSIS — J301 Allergic rhinitis due to pollen: Secondary | ICD-10-CM | POA: Diagnosis not present

## 2017-12-14 DIAGNOSIS — M47816 Spondylosis without myelopathy or radiculopathy, lumbar region: Secondary | ICD-10-CM

## 2017-12-14 DIAGNOSIS — G8929 Other chronic pain: Secondary | ICD-10-CM

## 2017-12-14 DIAGNOSIS — M5416 Radiculopathy, lumbar region: Secondary | ICD-10-CM | POA: Diagnosis not present

## 2017-12-14 DIAGNOSIS — G894 Chronic pain syndrome: Secondary | ICD-10-CM | POA: Diagnosis not present

## 2017-12-14 DIAGNOSIS — M5442 Lumbago with sciatica, left side: Secondary | ICD-10-CM

## 2017-12-14 DIAGNOSIS — M5441 Lumbago with sciatica, right side: Secondary | ICD-10-CM

## 2017-12-14 MED ORDER — BETAMETHASONE SOD PHOS & ACET 6 (3-3) MG/ML IJ SUSP: 12.0000 mg | Freq: Once | INTRAMUSCULAR | Status: DC

## 2017-12-14 NOTE — Progress Notes (Signed)
Peter [name] - 62 y.o. male MRN [id number]  Date of birth: Aug 15, 1955  Office Visit Note: Visit Date: 11/28/2017 PCP: [name], MD Referred by: [name], MD  Subjective: Chief Complaint  Patient presents with  . Lower Back - Pain  . Left Thigh - Pain   HPI: Peter [name] is a 62 y.o. male who comes in today For planned bilateral L4-5 facet joint blocks diagnostically hopefully therapeutically.  Patient recently saw Dr. Joni Fears for follow-up he continues to see him for his orthopedic care.  Patient has been seen at [facility] for pain management he gives a history that he did not really want to undergo injection treatment there.  They seem diagnostic medial branch blocks and radiofrequency ablation which is what we have talked about in the past as well.  I have not seen the patient in some time.  Over the years she has had chronic medical issues and chronic anticoagulation with history of alcohol induced fatty liver and anxiety as well as other medical complications.  He has had a new MRI since of seen and that was reviewed with him today and reviewed below.  He does have left-sided disc herniation at L4-5 with continued facet arthropathy.  Has a lot of back pain issues and some pain in the left thigh.  Biggest issue is his low back.  He does use tramadol from his primary care physician states this does help.  If not beneficial will consider left L4 transforaminal steroid injection.  ROS Otherwise per HPI.  Assessment & Plan: Visit Diagnoses:  1. Chronic bilateral low back pain, unspecified whether sciatica present   2. Spondylosis without myelopathy or radiculopathy, lumbar region     Plan: No additional findings.   Meds & Orders:  Meds ordered this encounter  Medications  . methylPREDNISolone acetate (DEPO-MEDROL) injection 80 mg    Orders Placed This Encounter  Procedures  . Facet Injection  . XR C-ARM NO REPORT    Follow-up: Return if symptoms worsen  or fail to improve.   Procedures: No procedures performed  Lumbar Facet Joint Intra-Articular Injection(s) with Fluoroscopic Guidance  Patient: Peter [name]      Date of Birth: 1955/05/15 MRN: [id number] PCP: [name], MD      Visit Date: 11/28/2017   Universal Protocol:    Date/Time: 11/28/2017  Consent Given By: the patient  Position: PRONE   Additional Comments: Vital signs were monitored before and after the procedure. Patient was prepped and draped in the usual sterile fashion. The correct patient, procedure, and site was verified.   Injection Procedure Details:  Procedure Site One Meds Administered:  Meds ordered this encounter  Medications  . methylPREDNISolone acetate (DEPO-MEDROL) injection 80 mg     Laterality: Bilateral  Location/Site:  L4-L5  Needle size: 22 guage  Needle type: Spinal  Needle Placement: Articular  Findings:  -Comments: Excellent flow of contrast producing a partial arthrogram.  Procedure Details: The fluoroscope beam is vertically oriented in AP, and the inferior recess is visualized beneath the lower pole of the inferior apophyseal process, which represents the target point for needle insertion. When direct visualization is difficult the target point is located at the medial projection of the vertebral pedicle. The region overlying each aforementioned target is locally anesthetized with a 1 to 2 ml. volume of 1% Lidocaine without Epinephrine.   The spinal needle was inserted into each of the above mentioned facet joints using biplanar fluoroscopic guidance. A  0.25 to 0.5 ml. volume of Isovue-250 was injected and a partial facet joint arthrogram was obtained. A single spot film was obtained of the resulting arthrogram.    One to 1.25 ml of the steroid/anesthetic solution was then injected into each of the facet joints noted above.   Additional Comments:  The patient tolerated the procedure well Dressing: Band-Aid     Post-procedure details: Patient was observed during the procedure. Post-procedure instructions were reviewed.  Patient left the clinic in stable condition.    Clinical History: MRI LUMBAR SPINE WITHOUT CONTRAST  TECHNIQUE: Multiplanar, multisequence MR imaging of the lumbar spine was performed. No intravenous contrast was administered.  COMPARISON:  MRI lumbar spine 10/11/2014.  FINDINGS: Segmentation:  Standard.  Alignment:  Maintained.  Vertebrae: No fracture or worrisome lesion. Marrow signal is mildly heterogeneous with scattered degenerative endplate signal change appearing worst at L3-4.  Conus medullaris and cauda equina: Conus extends to the L1 level. Conus and cauda equina appear normal.  Paraspinal and other soft tissues: Negative.  Disc levels:  T11-12 is imaged in the sagittal plane only. There is a minimal disc bulge but no central canal or foraminal narrowing.  T12-L1: Negative.  L1-2: Negative.  L2-3: Shallow disc bulge with some endplate spurring. There is mild central canal and foraminal narrowing. No nerve root compression. The appearance is not markedly changed.  L3-4: Shallow broad-based disc bulge is somewhat more prominent to the left. There is some narrowing in the left lateral recess. Mild to moderate foraminal narrowing is more notable on the left. Extraforaminal disc contacts the exited L3 roots. The appearance of this level is unchanged.  L4-5: The patient has a shallow disc bulge. More focally protruding disc within and beyond the left foramen is more prominent than on the prior exam and contacts the exiting and exited left L4 root. Moderate bilateral foraminal narrowing is seen. There is mild narrowing in the left lateral recess. Moderate facet arthropathy is noted.  L5-S1: Shallow disc bulge without stenosis.  IMPRESSION: Progressive degenerative disease at L4-5 where a disc bulge eccentrically prominent to  the left and more focally protruding disc within and beyond the left foramen has increased in size. Disc contacts the exiting and exited left L4 root. There is also some narrowing in the left lateral recess at this level. The appearance lumbar spine is otherwise unchanged.  Extraforaminal disc contacts the exited L3 roots bilaterally at L3-4, unchanged.  Mild central canal and bilateral foraminal narrowing at L2-3, unchanged.   Electronically Signed   By: Inge Rise M.D.   On: 04/11/2017 20:33   He reports that he has been smoking cigarettes. He has a 2.40 pack-year smoking history. He has never used smokeless tobacco. No results for input(s): HGBA1C, LABURIC in the last 8760 hours.  Objective:  VS:  HT:    WT:   BMI:     BP:(!) 154/77  HR:(!) 54bpm  TEMP:97.7 F (36.5 C)(Oral)  RESP:  Physical Exam  Musculoskeletal:  Patient ambulates without aid.  He has pain with extension and facet joint loading.  He has good distal strength.    Ortho Exam Imaging: No results found.  Past Medical/Family/Surgical/Social History: Medications & Allergies reviewed per EMR, new medications updated. Patient Active Problem List   Diagnosis Date Noted  . BPH (benign prostatic hyperplasia) 11/08/2017  . Rheumatoid factor positive 10/18/2017  . Hypomagnesemia 10/18/2017  . Chronic anticoagulation (ELIQUIS) 10/18/2017  . Decreased shoulder range of motion (ROM) (Left) 10/18/2017  . Tendinopathy  of rotator cuff (Left) 10/18/2017  . Labral tear of shoulder, sequela (Left) 10/18/2017  . Strain of subscapularis muscle, sequela (Left) 10/18/2017  . Chronic knee pain (Right) 10/18/2017  . Arthropathy of knee (Right) 10/18/2017  . Enthesopathy of knee region (Right) 10/18/2017  . Osteoarthritis of knee (Right) 10/18/2017  . Lumbar spondylosis 10/18/2017  . DDD (degenerative disc disease), lumbar 10/18/2017  . Lumbar facet arthropathy 10/18/2017  . Lumbar facet syndrome 10/18/2017  .  Lumbar lateral recess stenosis (Left: L3-4, L4-5) 10/18/2017  . Lumbar central spinal stenosis (L2-3) 10/18/2017  . Lumbar foraminal stenosis 10/18/2017  . History of esophageal stricture 10/18/2017  . Chronic low back pain (Primary Area of Pain) (Bilateral) (L>R) w/ sciatica (Bilateral) 09/28/2017  . Chronic lower extremity pain (Secondary Area of Pain) (Bilateral) (L>R) 09/28/2017  . Chronic pain syndrome 09/28/2017  . Long term current use of opiate analgesic 09/28/2017  . Pharmacologic therapy 09/28/2017  . Disorder of skeletal system 09/28/2017  . Problems influencing health status 09/28/2017  . Chronic thoracic back pain [facility] Area of Pain) (Midline) 09/28/2017  . Atypical chest pain 09/12/2017  . Chronic shoulder pain (Left) 06/12/2017  . Allergic rhinitis 04/24/2017  . Thrombocytopenia ([place]) 01/23/2017  . Polypharmacy 01/20/2017  . Syncope 01/19/2017  . Elevated troponin 01/19/2017  . Sinusitis 06/08/2016  . Tremor 03/23/2016  . Paresthesia 03/23/2016  . Anxiety 02/23/2016  . Typical atrial flutter ([place]) 09/21/2015  . CAD (coronary artery disease) 05/06/2015  . Aortic atherosclerosis ([place]) 03/20/2015  . History of alcohol abuse 02/04/2015  . Alcohol induced fatty liver 02/04/2015  . Obesity (BMI 30.0-34.9) 02/04/2015  . History of prostate cancer 02/04/2015  . Chronic venous insufficiency 10/12/2012  . Lesion of ulnar nerve 09/03/2012  . Tobacco abuse 04/05/2011  . Hyperlipidemia 01/21/2010  . Essential hypertension 12/16/2009  . Premature beats 12/16/2009   Past Medical History:  Diagnosis Date  . A-fib ([place])   . Abnormality of gait 09/03/2012  . Alcohol abuse   . Anxiety   . Arthritis   . Arthritis   . Esophageal stricture   . GERD (gastroesophageal reflux disease)   . Gout   . Hyperlipidemia   . Hypertension   . Internal hemorrhoids   . Lesion of ulnar nerve 09/03/2012   Right ulnar neuropathy  . Murmur, cardiac   . PAF (paroxysmal atrial  fibrillation) ([place])   . Palpitations   . Prostate cancer ([place]) 2016   treated with radioactive seed implant  . Seizures ([place])    pt states he had seizure 12/14  . Tubular adenoma of colon 03/2008   Family History  Problem Relation Age of Onset  . Diabetes Father   . Kidney disease Father   . Lung cancer Father        smoker  . Hyperlipidemia Father   . Hypertension Father   . Lung cancer Mother        smoke  . Hypertension Mother   . Colon cancer Neg Hx   . Pancreatic cancer Neg Hx   . Stomach cancer Neg Hx    Past Surgical History:  Procedure Laterality Date  . CATARACT EXTRACTION W/PHACO Right 07/03/2014   Procedure: CATARACT EXTRACTION PHACO AND INTRAOCULAR LENS PLACEMENT (IOC);  Surgeon: Lyla Glassing, MD;  Location: [facility] ORS;  Service: Ophthalmology;  Laterality: Right;  lot # [id number] H [place]: 00:32.3 AP% 9.1 CDE: 2.92  . CIRCUMCISION    . COLONOSCOPY    . ELBOW SURGERY    . ELECTROPHYSIOLOGIC STUDY N/A  11/02/2015   Procedure: CARDIOVERSION;  Surgeon: Minna Merritts, MD;  Location: [facility] ORS;  Service: Cardiovascular;  Laterality: N/A;  . ESOPHAGOGASTRODUODENOSCOPY (EGD) WITH PROPOFOL N/A 05/17/2016   Procedure: ESOPHAGOGASTRODUODENOSCOPY (EGD) WITH PROPOFOL;  Surgeon: Lucilla Lame, MD;  Location: [facility] ENDOSCOPY;  Service: Endoscopy;  Laterality: N/A;  . EYE SURGERY    . HAND SURGERY Left   . SHOULDER ARTHROSCOPY Left   . SHOULDER SURGERY     Social History   Occupational History  . Occupation: Retired    Fish farm manager: Economist  . Occupation: UTILITIES OPER.    Employer: [employer]  Tobacco Use  . Smoking status: Current Some Day Smoker    Packs/day: 0.10    Years: 24.00    Pack years: 2.40    Types: Cigarettes  . Smokeless tobacco: Never Used  . Tobacco comment: down to 4-5  ciggs a day.  Substance and Sexual Activity  . Alcohol use: Yes    Alcohol/week: 2.0 standard drinks    Types: 2 Cans of beer per week    Frequency: Never  . Drug use: No    Types:  Marijuana    Comment: 1970's occ.   Marland Kitchen Sexual activity: Not on file

## 2017-12-14 NOTE — Progress Notes (Signed)
 .  Numeric Pain Rating Scale and Functional Assessment Average Pain 1   In the last MONTH (on 0-10 scale) has pain interfered with the following?  1. General activity like being  able to carry out your everyday physical activities such as walking, climbing stairs, carrying groceries, or moving a chair?  Rating(2)   +Driver, +BT(eliquis, ok for inj), -Dye Allergies.

## 2017-12-14 NOTE — Procedures (Signed)
Lumbar Facet Joint Intra-Articular Injection(s) with Fluoroscopic Guidance  Patient: Peter [name]      Date of Birth: 1955/07/26 MRN: [id number] PCP: [name], MD      Visit Date: 11/28/2017   Universal Protocol:    Date/Time: 11/28/2017  Consent Given By: the patient  Position: PRONE   Additional Comments: Vital signs were monitored before and after the procedure. Patient was prepped and draped in the usual sterile fashion. The correct patient, procedure, and site was verified.   Injection Procedure Details:  Procedure Site One Meds Administered:  Meds ordered this encounter  Medications  . methylPREDNISolone acetate (DEPO-MEDROL) injection 80 mg     Laterality: Bilateral  Location/Site:  L4-L5  Needle size: 22 guage  Needle type: Spinal  Needle Placement: Articular  Findings:  -Comments: Excellent flow of contrast producing a partial arthrogram.  Procedure Details: The fluoroscope beam is vertically oriented in AP, and the inferior recess is visualized beneath the lower pole of the inferior apophyseal process, which represents the target point for needle insertion. When direct visualization is difficult the target point is located at the medial projection of the vertebral pedicle. The region overlying each aforementioned target is locally anesthetized with a 1 to 2 ml. volume of 1% Lidocaine without Epinephrine.   The spinal needle was inserted into each of the above mentioned facet joints using biplanar fluoroscopic guidance. A 0.25 to 0.5 ml. volume of Isovue-250 was injected and a partial facet joint arthrogram was obtained. A single spot film was obtained of the resulting arthrogram.    One to 1.25 ml of the steroid/anesthetic solution was then injected into each of the facet joints noted above.   Additional Comments:  The patient tolerated the procedure well Dressing: Band-Aid    Post-procedure details: Patient was observed during the  procedure. Post-procedure instructions were reviewed.  Patient left the clinic in stable condition.

## 2017-12-14 NOTE — Procedures (Deleted)
Lumbosacral Transforaminal Epidural Steroid Injection - Sub-Pedicular Approach with Fluoroscopic Guidance  Patient: Peter [name]      Date of Birth: 1955/06/09 MRN: [id number] PCP: [name], MD      Visit Date: 12/14/2017   Universal Protocol:    Date/Time: 12/14/2017  Consent Given By: the patient  Position: PRONE  Additional Comments: Vital signs were monitored before and after the procedure. Patient was prepped and draped in the usual sterile fashion. The correct patient, procedure, and site was verified.   Injection Procedure Details:  Procedure Site One Meds Administered:  Meds ordered this encounter  Medications  . betamethasone acetate-betamethasone sodium phosphate (CELESTONE) injection 12 mg    Laterality: Left  Location/Site:  L4-L5  Needle size: 22 G  Needle type: Spinal  Needle Placement: Transforaminal  Findings:    -Comments: Excellent flow of contrast along the nerve and into the epidural space.  Procedure Details: After squaring off the end-plates to get a true AP view, the C-arm was positioned so that an oblique view of the foramen as noted above was visualized. The target area is just inferior to the "nose of the scotty dog" or sub pedicular. The soft tissues overlying this structure were infiltrated with 2-3 ml. of 1% Lidocaine without Epinephrine.  The spinal needle was inserted toward the target using a "trajectory" view along the fluoroscope beam.  Under AP and lateral visualization, the needle was advanced so it did not puncture dura and was located close the 6 O'Clock position of the pedical in AP tracterory. Biplanar projections were used to confirm position. Aspiration was confirmed to be negative for CSF and/or blood. A 1-2 ml. volume of Isovue-250 was injected and flow of contrast was noted at each level. Radiographs were obtained for documentation purposes.   After attaining the desired flow of contrast documented above, a 0.5  to 1.0 ml test dose of 0.25% Marcaine was injected into each respective transforaminal space.  The patient was observed for 90 seconds post injection.  After no sensory deficits were reported, and normal lower extremity motor function was noted,   the above injectate was administered so that equal amounts of the injectate were placed at each foramen (level) into the transforaminal epidural space.   Additional Comments:  The patient tolerated the procedure well Dressing: Band-Aid    Post-procedure details: Patient was observed during the procedure. Post-procedure instructions were reviewed.  Patient left the clinic in stable condition.

## 2017-12-14 NOTE — Patient Instructions (Signed)

## 2017-12-18 ENCOUNTER — Other Ambulatory Visit: Payer: Self-pay | Admitting: Neurology

## 2017-12-18 NOTE — Telephone Encounter (Signed)
Per Patient : [facility] Verbal order to fill Praluent    Please call   [phone]

## 2017-12-19 NOTE — Telephone Encounter (Signed)
Left voicemail message that I would call his pharmacy and if he should have any further questions to please call back.

## 2017-12-19 NOTE — Telephone Encounter (Signed)
Attempted to call pharmacy but due to length of time had to hang up. Will try to call first thing tomorrow.

## 2017-12-21 NOTE — Progress Notes (Signed)
Peter [name] - 62 y.o. male MRN [id number]  Date of birth: [birth year]  Office Visit Note: Visit Date: 12/14/2017 PCP: [name], MD Referred by: [name], MD  Subjective: Chief Complaint  Patient presents with  . Lower Back - Pain  . Right Thigh - Pain  . Left Thigh - Pain   HPI: Peter [name] is a 62 y.o. male who comes in today Evaluation management status post facet joint block.  Patient reports that the injections seem to help some but his pain level really on average is only 1 out of 10.  He says is not really limiting what he can do although even though he tells you this when you get into more of the interview he basically tells you there is a lot of things he can do that he would like to do.  He also interjects today that he does have a hearing for disability coming up and does want copies of the dictated notes.  We did tell him to address this with the front staff in terms of getting the copies of his notes.  Nonetheless his main complaint is pain in the lower back that refers to both legs equally no real difference left to right.  This is mainly in the anterior lateral thighs but somewhat posterior laterally.  This does not seem to go past the knees.  He does endorse problems in both feet however.  He reports the injection took his pain level from a 02/12/1999.  He does continue to use tramadol.  He reports that daily activities that require more than 30 minutes makes his pain worse.  He does report heating pad does help with his pain.  He denies any focal weakness but feels weak in the legs like they are getting give out.  He has had physical therapy in the past and does not feel like that would do anything at this point.  He has had chiropractic care as well.  He has multiple medical conditions which are reviewed in the history but include the use of anticoagulation chronically as well as history of liver problems history of tobacco and alcohol abuse.  Patient does  suffer from anxiety.  He is retired.  Review of Systems  Constitutional: Positive for malaise/fatigue. Negative for chills, fever and weight loss.  HENT: Negative for hearing loss and sinus pain.   Eyes: Negative for blurred vision, double vision and photophobia.  Respiratory: Negative for cough and shortness of breath.   Cardiovascular: Negative for chest pain, palpitations and leg swelling.  Gastrointestinal: Negative for abdominal pain, nausea and vomiting.  Genitourinary: Negative for flank pain.  Musculoskeletal: Positive for back pain and joint pain. Negative for myalgias.  Skin: Negative for itching and rash.  Neurological: Positive for tingling and weakness. Negative for tremors and focal weakness.  Endo/Heme/Allergies: Negative.   Psychiatric/Behavioral: Negative for depression.  All other systems reviewed and are negative.  Otherwise per HPI.  Assessment & Plan: Visit Diagnoses:  1. Lumbar radiculopathy   2. Spondylosis without myelopathy or radiculopathy, lumbar region   3. Chronic bilateral low back pain with bilateral sciatica   4. Chronic pain syndrome     Plan: Findings:  Chronic history of back pain and chronic pain syndrome with multiple orthopedic complaints and conditions.  Lumbar spine MRI over the years has progressed and spondylosis and arthritis at L4-5 with some lateral recess narrowing on the right do to more right extraforaminal disc bulging.  He does  not have any focal compression that was cause real radicular pain bilaterally and he does have symptoms equally.  I am not sure if he is been thoroughly evaluated through rheumatology but he has had a history of rheumatoid factor positivity.  At this point no further interventions of the spine are needed.  His pain level reported to me as a 1 out of 10 even though he does report difficulty with functional activities.  I would encourage continued physical therapy and exercises.  We discussed the use of strengthening  exercises for his spine.  We discussed the fact that he has a good spine from a structural standpoint and that he could do exercises that would strengthen his core and that would help him in the long run.  We will see him back as needed and he will continue to see Dr. Durward Fortes for his orthopedic complaints.  As of note the patient did get an evaluation at the [place] [facility] with Dr. Dossie Arbour and did just not feel like that was something he wanted to continue.  I am not sure pain clinic would be great for someone with 1 out of 10 pain although I think he would benefit from a comprehensive pain standpoint given his medical and anxiety history.    Meds & Orders:  Meds ordered this encounter  Medications  . DISCONTD: betamethasone acetate-betamethasone sodium phosphate (CELESTONE) injection 12 mg   No orders of the defined types were placed in this encounter.   Follow-up: Return if symptoms worsen or fail to improve.   Procedures: No procedures performed  No notes on file   Clinical History: MRI LUMBAR SPINE WITHOUT CONTRAST  TECHNIQUE: Multiplanar, multisequence MR imaging of the lumbar spine was performed. No intravenous contrast was administered.  COMPARISON:  MRI lumbar spine 10/11/2014.  FINDINGS: Segmentation:  Standard.  Alignment:  Maintained.  Vertebrae: No fracture or worrisome lesion. Marrow signal is mildly heterogeneous with scattered degenerative endplate signal change appearing worst at L3-4.  Conus medullaris and cauda equina: Conus extends to the L1 level. Conus and cauda equina appear normal.  Paraspinal and other soft tissues: Negative.  Disc levels:  T11-12 is imaged in the sagittal plane only. There is a minimal disc bulge but no central canal or foraminal narrowing.  T12-L1: Negative.  L1-2: Negative.  L2-3: Shallow disc bulge with some endplate spurring. There is mild central canal and foraminal narrowing. No nerve root  compression. The appearance is not markedly changed.  L3-4: Shallow broad-based disc bulge is somewhat more prominent to the left. There is some narrowing in the left lateral recess. Mild to moderate foraminal narrowing is more notable on the left. Extraforaminal disc contacts the exited L3 roots. The appearance of this level is unchanged.  L4-5: The patient has a shallow disc bulge. More focally protruding disc within and beyond the left foramen is more prominent than on the prior exam and contacts the exiting and exited left L4 root. Moderate bilateral foraminal narrowing is seen. There is mild narrowing in the left lateral recess. Moderate facet arthropathy is noted.  L5-S1: Shallow disc bulge without stenosis.  IMPRESSION: Progressive degenerative disease at L4-5 where a disc bulge eccentrically prominent to the left and more focally protruding disc within and beyond the left foramen has increased in size. Disc contacts the exiting and exited left L4 root. There is also some narrowing in the left lateral recess at this level. The appearance lumbar spine is otherwise unchanged.  Extraforaminal disc contacts the  exited L3 roots bilaterally at L3-4, unchanged.  Mild central canal and bilateral foraminal narrowing at L2-3, unchanged.   Electronically Signed   By: Inge Rise M.D.   On: 04/11/2017 20:33   He reports that he has been smoking cigarettes. He has a 2.40 pack-year smoking history. He has never used smokeless tobacco. No results for input(s): HGBA1C, LABURIC in the last 8760 hours.  Objective:  VS:  HT:    WT:   BMI:     BP:(!) 155/66  HR:60bpm  TEMP:98.1 F (36.7 C)(Oral)  RESP:  Physical Exam Vitals signs and nursing note reviewed.  Constitutional:      General: He is not in acute distress.    Appearance: Normal appearance. He is well-developed. He is obese.  HENT:     Head: Normocephalic and atraumatic.  Eyes:     Conjunctiva/sclera:  Conjunctivae normal.     Pupils: Pupils are equal, round, and reactive to light.  Neck:     Musculoskeletal: Normal range of motion and neck supple. No neck rigidity.  Cardiovascular:     Rate and Rhythm: Normal rate and regular rhythm.     Pulses: Normal pulses.     Heart sounds: Normal heart sounds.  Pulmonary:     Effort: Pulmonary effort is normal. No respiratory distress.  Musculoskeletal:     Right lower leg: No edema.     Left lower leg: No edema.     Comments: Patient ambulates without aid he is somewhat slow to go from sit to stand a full extension.  Some pain with extension of the lumbar spine and facet loading.  No pain over the greater trochanters or with hip rotation.  Good distal strength.  Skin:    General: Skin is warm and dry.     Findings: No erythema or rash.  Neurological:     General: No focal deficit present.     Mental Status: He is alert and oriented to person, place, and time.     Sensory: No sensory deficit.     Coordination: Coordination normal.     Gait: Gait normal.  Psychiatric:        Mood and Affect: Mood normal.        Behavior: Behavior normal.     Ortho Exam Imaging: No results found.  Past Medical/Family/Surgical/Social History: Medications & Allergies reviewed per EMR, new medications updated. Patient Active Problem List   Diagnosis Date Noted  . BPH (benign prostatic hyperplasia) 11/08/2017  . Rheumatoid factor positive 10/18/2017  . Hypomagnesemia 10/18/2017  . Chronic anticoagulation (ELIQUIS) 10/18/2017  . Decreased shoulder range of motion (ROM) (Left) 10/18/2017  . Tendinopathy of rotator cuff (Left) 10/18/2017  . Labral tear of shoulder, sequela (Left) 10/18/2017  . Strain of subscapularis muscle, sequela (Left) 10/18/2017  . Chronic knee pain (Right) 10/18/2017  . Arthropathy of knee (Right) 10/18/2017  . Enthesopathy of knee region (Right) 10/18/2017  . Osteoarthritis of knee (Right) 10/18/2017  . Lumbar spondylosis  10/18/2017  . DDD (degenerative disc disease), lumbar 10/18/2017  . Lumbar facet arthropathy 10/18/2017  . Lumbar facet syndrome 10/18/2017  . Lumbar lateral recess stenosis (Left: L3-4, L4-5) 10/18/2017  . Lumbar central spinal stenosis (L2-3) 10/18/2017  . Lumbar foraminal stenosis 10/18/2017  . History of esophageal stricture 10/18/2017  . Chronic low back pain (Primary Area of Pain) (Bilateral) (L>R) w/ sciatica (Bilateral) 09/28/2017  . Chronic lower extremity pain (Secondary Area of Pain) (Bilateral) (L>R) 09/28/2017  . Chronic pain syndrome 09/28/2017  .  Long term current use of opiate analgesic 09/28/2017  . Pharmacologic therapy 09/28/2017  . Disorder of skeletal system 09/28/2017  . Problems influencing health status 09/28/2017  . Chronic thoracic back pain [facility] Area of Pain) (Midline) 09/28/2017  . Atypical chest pain 09/12/2017  . Chronic shoulder pain (Left) 06/12/2017  . Allergic rhinitis 04/24/2017  . Thrombocytopenia ([place]) 01/23/2017  . Polypharmacy 01/20/2017  . Syncope 01/19/2017  . Elevated troponin 01/19/2017  . Sinusitis 06/08/2016  . Tremor 03/23/2016  . Paresthesia 03/23/2016  . Anxiety 02/23/2016  . Typical atrial flutter ([place]) 09/21/2015  . CAD (coronary artery disease) 05/06/2015  . Aortic atherosclerosis ([place]) 03/20/2015  . History of alcohol abuse 02/04/2015  . Alcohol induced fatty liver 02/04/2015  . Obesity (BMI 30.0-34.9) 02/04/2015  . History of prostate cancer 02/04/2015  . Chronic venous insufficiency 10/12/2012  . Lesion of ulnar nerve 09/03/2012  . Tobacco abuse 04/05/2011  . Hyperlipidemia 01/21/2010  . Essential hypertension 12/16/2009  . Premature beats 12/16/2009   Past Medical History:  Diagnosis Date  . A-fib ([place])   . Abnormality of gait 09/03/2012  . Alcohol abuse   . Anxiety   . Arthritis   . Arthritis   . Esophageal stricture   . GERD (gastroesophageal reflux disease)   . Gout   . Hyperlipidemia   . Hypertension     . Internal hemorrhoids   . Lesion of ulnar nerve 09/03/2012   Right ulnar neuropathy  . Murmur, cardiac   . PAF (paroxysmal atrial fibrillation) ([place])   . Palpitations   . Prostate cancer ([place]) 2016   treated with radioactive seed implant  . Seizures ([place])    pt states he had seizure 12/14  . Tubular adenoma of colon 03/2008   Family History  Problem Relation Age of Onset  . Diabetes Father   . Kidney disease Father   . Lung cancer Father        smoker  . Hyperlipidemia Father   . Hypertension Father   . Lung cancer Mother        smoke  . Hypertension Mother   . Colon cancer Neg Hx   . Pancreatic cancer Neg Hx   . Stomach cancer Neg Hx    Past Surgical History:  Procedure Laterality Date  . CATARACT EXTRACTION W/PHACO Right 07/03/2014   Procedure: CATARACT EXTRACTION PHACO AND INTRAOCULAR LENS PLACEMENT (IOC);  Surgeon: Lyla Glassing, MD;  Location: [facility] ORS;  Service: Ophthalmology;  Laterality: Right;  lot # [id number] H [place]: 00:32.3 AP% 9.1 CDE: 2.92  . CIRCUMCISION    . COLONOSCOPY    . ELBOW SURGERY    . ELECTROPHYSIOLOGIC STUDY N/A 11/02/2015   Procedure: CARDIOVERSION;  Surgeon: Minna Merritts, MD;  Location: [facility] ORS;  Service: Cardiovascular;  Laterality: N/A;  . ESOPHAGOGASTRODUODENOSCOPY (EGD) WITH PROPOFOL N/A 05/17/2016   Procedure: ESOPHAGOGASTRODUODENOSCOPY (EGD) WITH PROPOFOL;  Surgeon: Lucilla Lame, MD;  Location: [facility] ENDOSCOPY;  Service: Endoscopy;  Laterality: N/A;  . EYE SURGERY    . HAND SURGERY Left   . SHOULDER ARTHROSCOPY Left   . SHOULDER SURGERY     Social History   Occupational History  . Occupation: Retired    Fish farm manager: Economist  . Occupation: UTILITIES OPER.    Employer: [employer]  Tobacco Use  . Smoking status: Current Some Day Smoker    Packs/day: 0.10    Years: 24.00    Pack years: 2.40    Types: Cigarettes  . Smokeless tobacco: Never Used  . Tobacco  comment: down to 4-5  ciggs a day.  Substance and Sexual Activity  .  Alcohol use: Yes    Alcohol/week: 2.0 standard drinks    Types: 2 Cans of beer per week    Frequency: Never  . Drug use: No    Types: Marijuana    Comment: 1970's occ.   Marland Kitchen Sexual activity: Not on file

## 2017-12-22 DIAGNOSIS — J301 Allergic rhinitis due to pollen: Secondary | ICD-10-CM | POA: Diagnosis not present

## 2017-12-25 ENCOUNTER — Ambulatory Visit: Payer: Self-pay

## 2017-12-25 NOTE — Telephone Encounter (Signed)
Pt reports non-productive cough, onset Thursday. Seen by L. Guse 10/31/17 for URI, placed on prednisone, albuterol inhaler and tessalon. CXR done 10/31/17.  States "Never went away" and saw Dr. Jewel Baize 11/08/17 , started on Zithromax. On 11/13/17 per telephone encounter, pt still having symptoms, Dr. Aundra Dubin called in Levaquin x 5 days. Pt states felt better for "A while, then these same symptoms have returned." Denies SOB, wheezing, afebrile. Has been taking OTC Coricidin with little relief. States "There's a rattle in my upper chest that goes away once I cough." Pt states cough is dry. Pt requesting "That last antibiotic be called in." (Levaquin) States he can not afford another office visit. Also requesting refill of inhaler. Pt made aware he would probably need to be seen. Reviewed Dr. Oleh Genin note recommending CT scan of chest for prolonged symptoms. Pt verbalizes understanding. Aware I would sent request for medications.  Please advise: [phone] Reason for Disposition . Cough has been present for > 3 weeks  Answer Assessment - Initial Assessment Questions 1. ONSET: "When did the cough begin?"     Thursday 2. SEVERITY: "How bad is the cough today?"      Moderate 3. RESPIRATORY DISTRESS: "Describe your breathing."     "Normal"  4. FEVER: "Do you have a fever?" If so, ask: "What is your temperature, how was it measured, and when did it start?"     no 5. HEMOPTYSIS: "Are you coughing up any blood?" If so ask: "How much?" (flecks, streaks, tablespoons, etc.)     no 6. TREATMENT: "What have you done so far to treat the cough?" (e.g., meds, fluids, humidifier)     Coricidin OTC, inhalers 7. CARDIAC HISTORY: "Do you have any history of heart disease?" (e.g., heart attack, congestive heart failure)      *No Answer* 8. LUNG HISTORY: "Do you have any history of lung disease?"  (e.g., pulmonary embolus, asthma, emphysema)     *No Answer* 9. PE RISK FACTORS: "Do you have a history of blood  clots?" (or: recent major surgery, recent prolonged travel, bedridden)     *No Answer* 10. OTHER SYMPTOMS: "Do you have any other symptoms? (e.g., runny nose, wheezing, chest pain)      Mild sore throat,  "Rattling in chest, goes away after I cough."  Protocols used: COUGH - ACUTE NON-PRODUCTIVE-A-AH

## 2017-12-25 NOTE — Telephone Encounter (Signed)
Attempted to contact patient. Left VM to return call to office. 

## 2017-12-27 ENCOUNTER — Ambulatory Visit (INDEPENDENT_AMBULATORY_CARE_PROVIDER_SITE_OTHER): Payer: 59 | Admitting: Family Medicine

## 2017-12-27 ENCOUNTER — Encounter: Payer: Self-pay | Admitting: Family Medicine

## 2017-12-27 VITALS — BP 138/70 | HR 67 | Temp 98.1°F | Ht 73.0 in | Wt 255.2 lb

## 2017-12-27 DIAGNOSIS — J069 Acute upper respiratory infection, unspecified: Secondary | ICD-10-CM | POA: Diagnosis not present

## 2017-12-27 DIAGNOSIS — R05 Cough: Secondary | ICD-10-CM

## 2017-12-27 DIAGNOSIS — J40 Bronchitis, not specified as acute or chronic: Secondary | ICD-10-CM | POA: Diagnosis not present

## 2017-12-27 DIAGNOSIS — D696 Thrombocytopenia, unspecified: Secondary | ICD-10-CM | POA: Diagnosis not present

## 2017-12-27 DIAGNOSIS — R059 Cough, unspecified: Secondary | ICD-10-CM

## 2017-12-27 MED ORDER — ALBUTEROL SULFATE HFA 108 (90 BASE) MCG/ACT IN AERS: 2.0000 | INHALATION_SPRAY | Freq: Four times a day (QID) | RESPIRATORY_TRACT | 0 refills | Status: DC | PRN

## 2017-12-27 MED ORDER — DOXYCYCLINE HYCLATE 100 MG PO TABS: 100.0000 mg | ORAL_TABLET | Freq: Two times a day (BID) | ORAL | 0 refills | Status: DC

## 2017-12-27 NOTE — Progress Notes (Signed)
Tommi Rumps, MD Phone: [phone]  Peter [name] is a 62 y.o. male who presents today for same-day visit.  CC: Bronchitis, thrombocytopenia  Patient reports symptoms started 6 days ago.  He has chest congestion with cough and some wheezing.  He notes no shortness of breath or fever.  He does note some sinus drainage and is blowing clear mucus out of his nose.  He has tried Coricidin with little benefit.  He notes about 6 weeks ago he was treated for similar symptoms with azithromycin and then Levaquin when he did not improve.  The Levaquin did resolve his symptoms for several weeks.  He has been out in public shopping for the holidays and has been around sick people.  He notes he cannot tolerate prednisone as it causes him to swell.  Patient would like his platelets rechecked.  He has been working on dietary changes with folic acid and eating more greens to see if that would help.  He notes he has abstained from alcohol.  Social History   Tobacco Use  Smoking Status Current Some Day Smoker  . Packs/day: 0.10  . Years: 24.00  . Pack years: 2.40  . Types: Cigarettes  Smokeless Tobacco Never Used  Tobacco Comment   down to 4-5  ciggs a day.     ROS see history of present illness  Objective  Physical Exam Vitals:   12/27/17 0806  BP: 138/70  Pulse: 67  Temp: 98.1 F (36.7 C)  SpO2: 94%    BP Readings from Last 3 Encounters:  12/27/17 138/70  12/14/17 (!) 155/66  11/28/17 (!) 154/77   Wt Readings from Last 3 Encounters:  12/27/17 255 lb 3.2 oz (115.8 kg)  11/10/17 252 lb (114.3 kg)  11/08/17 261 lb 12.8 oz (118.8 kg)    Physical Exam Constitutional:      General: He is not in acute distress.    Appearance: He is not diaphoretic.  HENT:     Head: Normocephalic and atraumatic.     Right Ear: Tympanic membrane normal.     Left Ear: Tympanic membrane normal.     Mouth/Throat:     Mouth: Mucous membranes are moist.     Pharynx: Posterior oropharyngeal  erythema (Mild posterior oropharyngeal) present. No oropharyngeal exudate.  Eyes:     Conjunctiva/sclera: Conjunctivae normal.     Pupils: Pupils are equal, round, and reactive to light.  Cardiovascular:     Rate and Rhythm: Normal rate and regular rhythm.     Heart sounds: Normal heart sounds.  Pulmonary:     Effort: Pulmonary effort is normal. No respiratory distress.     Breath sounds: No stridor. Wheezing (Faint scattered) present. No rhonchi or rales.  Skin:    General: Skin is warm and dry.  Neurological:     Mental Status: He is alert.      Assessment/Plan: Please see individual problem list.  Bronchitis Symptoms are concerning for bronchitis.  We will treat with doxycycline.  Discussed prednisone use though he does report some swelling with this in the past so we will defer this.  Refill of albuterol given to use as needed.  If not improving he will contact us.  If his symptoms worsen or if he develops trouble breathing or fevers we would need to consider reevaluation in the office though if his symptoms just do not improve with doxycycline we could consider treating without an additional visit.  I did discuss that I would be out of the  office part of next week and then it would be up to the discretion of the provider covering for me.  He voiced understanding.  Thrombocytopenia ([place]) Plan to recheck platelets.  I did discuss with him that he may not be able to get his platelets to improve given his chronic liver disease.  Patient has an appointment in mid January.  He will keep his appointment scheduled for now and if he is feeling well prior to that visit he will cancel it and schedule a visit for 3 to 4 months after that.  Orders Placed This Encounter  Procedures  . CBC    Meds ordered this encounter  Medications  . doxycycline (VIBRA-TABS) 100 MG tablet    Sig: Take 1 tablet (100 mg total) by mouth 2 (two) times daily.    Dispense:  14 tablet    Refill:  0  .  albuterol (PROVENTIL HFA;VENTOLIN HFA) 108 (90 Base) MCG/ACT inhaler    Sig: Inhale 2 puffs into the lungs every 6 (six) hours as needed for wheezing or shortness of breath.    Dispense:  1 Inhaler    Refill:  0     Tommi Rumps, MD [place]

## 2017-12-27 NOTE — Telephone Encounter (Signed)
Sent to PCP please advise.  

## 2017-12-27 NOTE — Patient Instructions (Signed)
Nice to see you. You likely have bronchitis.  We will treat you with doxycycline. Please try to avoid excessive sun exposure when taking this antibiotic. Please try to take a probiotic when on the antibiotic. You can use the albuterol inhaler as prescribed. If you do not improve with use of antibiotic after 7 days please contact us. If you develop worsening symptoms, shortness of breath, cough productive of blood, or any new or changing symptoms please be reevaluated.

## 2017-12-27 NOTE — Assessment & Plan Note (Signed)
Plan to recheck platelets.  I did discuss with him that he may not be able to get his platelets to improve given his chronic liver disease.

## 2017-12-27 NOTE — Assessment & Plan Note (Signed)
Symptoms are concerning for bronchitis.  We will treat with doxycycline.  Discussed prednisone use though he does report some swelling with this in the past so we will defer this.  Refill of albuterol given to use as needed.  If not improving he will contact us.  If his symptoms worsen or if he develops trouble breathing or fevers we would need to consider reevaluation in the office though if his symptoms just do not improve with doxycycline we could consider treating without an additional visit.  I did discuss that I would be out of the office part of next week and then it would be up to the discretion of the provider covering for me.  He voiced understanding.

## 2017-12-27 NOTE — Telephone Encounter (Signed)
Patient was seen in the office today. See office note.

## 2018-01-04 ENCOUNTER — Other Ambulatory Visit: Payer: Self-pay | Admitting: Family Medicine

## 2018-01-04 ENCOUNTER — Other Ambulatory Visit: Payer: Self-pay

## 2018-01-04 ENCOUNTER — Other Ambulatory Visit: Payer: Self-pay | Admitting: Pharmacist

## 2018-01-04 ENCOUNTER — Telehealth: Payer: Self-pay

## 2018-01-04 DIAGNOSIS — J069 Acute upper respiratory infection, unspecified: Secondary | ICD-10-CM

## 2018-01-04 DIAGNOSIS — R059 Cough, unspecified: Secondary | ICD-10-CM

## 2018-01-04 DIAGNOSIS — R05 Cough: Secondary | ICD-10-CM

## 2018-01-04 DIAGNOSIS — D696 Thrombocytopenia, unspecified: Secondary | ICD-10-CM | POA: Diagnosis not present

## 2018-01-04 MED ORDER — ALIROCUMAB 150 MG/ML ~~LOC~~ SOAJ: 1.0000 "pen " | SUBCUTANEOUS | 11 refills | Status: DC

## 2018-01-04 NOTE — Telephone Encounter (Signed)
Alirocumab (PRALUENT) 150 MG/ML SOAJ 2 pen 11 01/04/2018    Sig - Route: Inject 1 pen into the skin every 14 (fourteen) days. - Subcutaneous   Sent to pharmacy as: Alirocumab ([place]) 150 MG/ML Solution Auto-injector   E-Prescribing Status: Receipt confirmed by pharmacy (01/04/2018 3:38 PM EST)   Pharmacy   [place], [address]

## 2018-01-04 NOTE — Telephone Encounter (Signed)
*  STAT* If patient is at the pharmacy, call can be transferred to refill team.   1. Which medications need to be refilled? (please list name of each medication and dose if known) Praluant  2. Which pharmacy/location (including street and city if local pharmacy) is medication to be sent to? [facility]  3. Do they need a 30 day or 90 day supply?

## 2018-01-04 NOTE — Telephone Encounter (Signed)
[facility] number

## 2018-01-04 NOTE — Telephone Encounter (Signed)
Spoke with Myna Bright from [facility] regarding prior auth for Praluent  He verified that Praluent was Approved from December 04, 2017 to November 25th 2020 He will be refaxing approval letter.

## 2018-01-04 NOTE — Telephone Encounter (Signed)
Last OV 12/27/2017   Tramadol last refilled 12/04/2017 disp 60 with no refills   Doxycyline last refilled 12/23/2017 disp 14 with no refills   Sent to PCP for approval

## 2018-01-05 LAB — CBC
Hematocrit: 33.4 % — ABNORMAL LOW (ref 37.5–51.0)
Hemoglobin: 12.5 g/dL — ABNORMAL LOW (ref 13.0–17.7)
MCH: 39.3 pg — AB (ref 26.6–33.0)
MCHC: 37.4 g/dL — ABNORMAL HIGH (ref 31.5–35.7)
MCV: 105 fL — ABNORMAL HIGH (ref 79–97)
Platelets: 68 10*3/uL — CL (ref 150–450)
RBC: 3.18 x10E6/uL — ABNORMAL LOW (ref 4.14–5.80)
RDW: 13.1 % (ref 12.3–15.4)
WBC: 6.9 10*3/uL (ref 3.4–10.8)

## 2018-01-05 NOTE — Telephone Encounter (Signed)
Refills reviewed.  Tramadol sent to pharmacy to be filled on 01/07/2018.  The doxycycline is not an appropriate refill.  Please contact the patient to determine if he is continuing to have bronchitis symptoms.  If he is please find out what symptoms he is having.  Thanks.

## 2018-01-11 DIAGNOSIS — J301 Allergic rhinitis due to pollen: Secondary | ICD-10-CM | POA: Diagnosis not present

## 2018-01-22 ENCOUNTER — Telehealth: Payer: Self-pay | Admitting: Cardiovascular Disease

## 2018-01-22 NOTE — Telephone Encounter (Signed)
°*  STAT* If patient is at the pharmacy, call can be transferred to refill team.   1. Which medications need to be refilled? (please list name of each medication and dose if known) doxazosin 8 MG - 2 times daily  2. Which pharmacy/location (including street and city if local pharmacy) is medication to be sent to? [facility]   3. Do they need a 30 day or 90 day supply? 90 day  Patient states that the medication has been working but has been having to take the 4MG  4 times daily, ok per Dr Rockey Situ. Will need a refill and would like to know if he can be prescribed 8 MG instead of 4 MG so he can take 2 a day instead of 4 a day.  Please advise

## 2018-01-22 NOTE — Telephone Encounter (Signed)
Please advise if okay to refill. 

## 2018-01-23 ENCOUNTER — Encounter: Payer: Self-pay | Admitting: Family Medicine

## 2018-01-23 ENCOUNTER — Telehealth: Payer: Self-pay | Admitting: Family Medicine

## 2018-01-23 ENCOUNTER — Ambulatory Visit: Payer: 59 | Admitting: Family Medicine

## 2018-01-23 VITALS — BP 150/76 | HR 51 | Temp 98.1°F | Ht 73.0 in | Wt 258.0 lb

## 2018-01-23 DIAGNOSIS — R42 Dizziness and giddiness: Secondary | ICD-10-CM

## 2018-01-23 DIAGNOSIS — J309 Allergic rhinitis, unspecified: Secondary | ICD-10-CM

## 2018-01-23 DIAGNOSIS — R0989 Other specified symptoms and signs involving the circulatory and respiratory systems: Secondary | ICD-10-CM | POA: Diagnosis not present

## 2018-01-23 DIAGNOSIS — I10 Essential (primary) hypertension: Secondary | ICD-10-CM | POA: Diagnosis not present

## 2018-01-23 DIAGNOSIS — M542 Cervicalgia: Secondary | ICD-10-CM

## 2018-01-23 DIAGNOSIS — R5383 Other fatigue: Secondary | ICD-10-CM

## 2018-01-23 DIAGNOSIS — E669 Obesity, unspecified: Secondary | ICD-10-CM

## 2018-01-23 MED ORDER — HYDROCHLOROTHIAZIDE 12.5 MG PO TABS: 12.5000 mg | ORAL_TABLET | Freq: Every day | ORAL | 2 refills | Status: DC

## 2018-01-23 NOTE — Patient Instructions (Signed)
Nice to see you. Please try the Nasacort daily to see if that will help with your symptoms.  If your congestion or headaches do not improve with this please contact us.  Please try this for 1 week. We will check lab work and contact you with the results.

## 2018-01-23 NOTE — Assessment & Plan Note (Signed)
Patient with quite healthy diet with the exception of alcohol intake.  Discussed slowly cutting down on alcohol intake.  We will check a TSH to see if there is thyroid dysfunction contributing to his weight gain.

## 2018-01-23 NOTE — Telephone Encounter (Signed)
Please let the patient know that I forgot to advise him that I was going to order an ultrasound of his carotid arteries to evaluate for the abnormal sound I heard during his exam.  Somebody will contact him to get this set up.

## 2018-01-23 NOTE — Assessment & Plan Note (Signed)
Refill of HCTZ given.  I discussed the importance of him going to have lab work completed in the next several days.

## 2018-01-23 NOTE — Assessment & Plan Note (Signed)
I suspect this is contributing to his current congestion symptoms and likely contributing to the headaches he has been having.  I advised him to use his Nasacort consistently for the next 1 to 2 weeks and if not improving he will contact us.

## 2018-01-23 NOTE — Assessment & Plan Note (Signed)
Seems consistent with orthostasis.  Orthostatic vital signs negative today.  Advised to rise slowly.  We will check lab work through [employer] [facility] at patient request.

## 2018-01-23 NOTE — Assessment & Plan Note (Signed)
Carotid ultrasound ordered.

## 2018-01-23 NOTE — Assessment & Plan Note (Addendum)
Likely related to degenerative disc disease.  Could have some measure of arthritis as well.  This would be unlikely to be related to a vascular issue given chronicity.  He will monitor.  He will continue to follow with his rheumatologist.

## 2018-01-23 NOTE — Progress Notes (Signed)
Peter Rumps, MD Phone: [phone]  Peter [name] is a 63 y.o. male who presents today for follow up.  CC: Bronchitis, allergic rhinitis, lightheadedness, weight gain  Bronchitis/allergic rhinitis: Patient completed treatment.  Symptoms have improved somewhat though he still has some cough.  He notes he feels like he does not get a good breath in after coughing a lot.  He does note continued sinus congestion with clear nasal discharge.  Some sneezing.  Some headaches.  No postnasal drip.  No vision changes, numbness, weakness, chest pain, or shortness of breath.  He does note he has Nasacort at home though does not consistently use this.  Lightheadedness: Patient notes he is been feeling slightly lightheaded recently.  Typically occurs when he goes to stand up.  He has not had any syncopal episodes.  He is taking Bystolic, Cardura, and HCTZ.  Weight gain: Patient notes he quit smoking and since then his weight has gone up.  He eats fairly healthily with yogurt and boost for breakfast.  Sandwich and fruit cocktail for lunch.  Has steamed vegetables and lean meat or fish for dinner.  He does have 3 beers a day.  He does note some tiredness and fatigue that goes along with this.  No prior thyroid testing.  Unable to exercise given chronic muscular and joint issues for which she follows with a rheumatologist.  Neck pain: Patient notes chronic neck discomfort.  He notes it typically occurs when he is sitting up and having to lean over.  He wonders if he could have an issue with the vessels in his neck that could be contributing to this discomfort.  Social History   Tobacco Use  Smoking Status Former Smoker  . Packs/day: 0.10  . Years: 24.00  . Pack years: 2.40  . Types: Cigarettes  Smokeless Tobacco Never Used  Tobacco Comment   down to 4-5  ciggs a day.     ROS see history of present illness  Objective  Physical Exam Vitals:   01/23/18 1526  BP: (!) 150/76  Pulse: (!) 51    Temp: 98.1 F (36.7 C)  SpO2: 94%    BP Readings from Last 3 Encounters:  01/23/18 (!) 150/76  12/27/17 138/70  12/14/17 (!) 155/66   Wt Readings from Last 3 Encounters:  01/23/18 258 lb (117 kg)  12/27/17 255 lb 3.2 oz (115.8 kg)  11/10/17 252 lb (114.3 kg)    Physical Exam Constitutional:      General: He is not in acute distress.    Appearance: He is not diaphoretic.  HENT:     Head: Normocephalic and atraumatic.     Right Ear: Tympanic membrane normal.     Left Ear: Tympanic membrane normal.     Mouth/Throat:     Mouth: Mucous membranes are moist.     Pharynx: Oropharynx is clear.  Eyes:     Conjunctiva/sclera: Conjunctivae normal.     Pupils: Pupils are equal, round, and reactive to light.  Cardiovascular:     Rate and Rhythm: Normal rate and regular rhythm.     Heart sounds: Normal heart sounds.     Comments: Very slight bruit noted in the right carotid artery, no bruit left carotid artery Pulmonary:     Effort: Pulmonary effort is normal.     Breath sounds: Normal breath sounds.  Musculoskeletal:     Comments: No midline neck tenderness, no midline neck step-off, no muscular neck tenderness  Lymphadenopathy:     Cervical: No  cervical adenopathy.  Skin:    General: Skin is warm and dry.  Neurological:     Mental Status: He is alert.     Comments: CN 2-12 intact, 5/5 strength in bilateral biceps, triceps, grip, quads, hamstrings, plantar and dorsiflexion, sensation to light touch intact in bilateral UE and LE, normal gait      Assessment/Plan: Please see individual problem list.  Lightheadedness Seems consistent with orthostasis.  Orthostatic vital signs negative today.  Advised to rise slowly.  We will check lab work through [employer] [facility] at patient request.  Fatigue This may be related to his recent illness.  Discussed that he may improve as he gets further out from his prior illness.  With weight gain we will evaluate for thyroid dysfunction as well.  He  will monitor.  Obesity (BMI 30.0-34.9) Patient with quite healthy diet with the exception of alcohol intake.  Discussed slowly cutting down on alcohol intake.  We will check a TSH to see if there is thyroid dysfunction contributing to his weight gain.  Allergic rhinitis I suspect this is contributing to his current congestion symptoms and likely contributing to the headaches he has been having.  I advised him to use his Nasacort consistently for the next 1 to 2 weeks and if not improving he will contact us.  Bruit of right carotid artery Carotid ultrasound ordered.  Neck pain Likely related to degenerative disc disease.  Could have some measure of arthritis as well.  This would be unlikely to be related to a vascular issue given chronicity.  He will monitor.  He will continue to follow with his rheumatologist.  Essential hypertension Refill of HCTZ given.  I discussed the importance of him going to have lab work completed in the next several days.   Orders Placed This Encounter  Procedures  . US Carotid Duplex Bilateral    Standing Status:   Future    Standing Expiration Date:   03/24/2019    Order Specific Question:   Reason for exam:    Answer:   Carotid bruit on right    Order Specific Question:   Preferred imaging location?    Answer:   [facility]  . TSH  . B12  . Comp Met (CMET)    Meds ordered this encounter  Medications  . hydrochlorothiazide (HYDRODIURIL) 12.5 MG tablet    Sig: Take 1 tablet (12.5 mg total) by mouth daily. In the am for blood pressure    Dispense:  30 tablet    Refill:  2    Generic ok     Peter Rumps, MD [facility] Follow

## 2018-01-23 NOTE — Telephone Encounter (Signed)
Patient returning call.

## 2018-01-23 NOTE — Telephone Encounter (Signed)
No answer. Left message to call back.   

## 2018-01-23 NOTE — Assessment & Plan Note (Signed)
This may be related to his recent illness.  Discussed that he may improve as he gets further out from his prior illness.  With weight gain we will evaluate for thyroid dysfunction as well.  He will monitor.

## 2018-01-24 NOTE — Telephone Encounter (Signed)
Patient calling back.  Patient states he's been taking Doxazosin 4 mg four times a day. Last time it was changed, Dr Rockey Situ advised for patient to take 4 mg two times a day or 8 mg once a day. Patient would like a prescription for 8 mg by mouth two times a day. He says that this amount has helped keep his BP under control.  Advised I patient I would route to [facility] to verify this is ok to increase.

## 2018-01-24 NOTE — Telephone Encounter (Signed)
He was on cardura 4 BID, not 8 BID He was lightheaded with Dr. Caryl Bis 01/23/17 on cardura 4 BID Would check pressures at home after meds Also had bradycardia with dr. Caryl Bis, Suspect he will need to decrease bystolic to 10 mg daily, rate 51 bpm Would call us with BP numbers, sitting and standing

## 2018-01-24 NOTE — Telephone Encounter (Signed)
No answer. Left message to call back.   

## 2018-01-25 DIAGNOSIS — R5383 Other fatigue: Secondary | ICD-10-CM | POA: Diagnosis not present

## 2018-01-25 DIAGNOSIS — I10 Essential (primary) hypertension: Secondary | ICD-10-CM | POA: Diagnosis not present

## 2018-01-25 NOTE — Telephone Encounter (Signed)
Called patient and left a detailed VM. Advised patient to call back if needed. CRM created and sent to [facility] pool. Will try to call patient again later as well.

## 2018-01-25 NOTE — Telephone Encounter (Signed)
Sent to Dr.Gollan

## 2018-01-25 NOTE — Telephone Encounter (Signed)
Copied from [place] [phone] [phone]. Topic: General - Other >> Jan 25, 2018  3:20 PM Judyann Munson wrote: Reason for CRM: patient is returning call to [place], [place]. Please advise

## 2018-01-25 NOTE — Telephone Encounter (Signed)
Called patient to clarify medications.  Clarified that he has been taking doxazosin 4 mg four times a day for about 10 days. He increased doxazosin when he stopped bystolic 10 days ago.  So he is NOT taking bystolic at this time (removed from list now) though it was reported when he saw PCP on 01/23/18.  Patient provided most recent BP readings: 1-12 AM 113/57, 55  NOON 112/57,  53 PM 113/58,  57 1-13 AM 152/76,  54 NOON 135/62,  57 PM  NONE 1/14 AM 110/75,  54 NOON 140/67,  58 PM 143/65,  64 1/15 AM 102/52,  57 NOON 116/53,  55 PM 131/65,  53 1/16 AM 162/74,  69  He just wants Dr Donivan Scull blessing to stay off bystolic and take the doxazosin 8 mg two times a day. He says he is no longer dizzy and that when Dr Caryl Bis did orthostatic BP/HR there was not much change. He verbalized understanding to take BP sitting and standing as well and call us with readings.  He is scheduled to see Dr Rockey Situ (does not want to see APP) on 03/05/18. Routing to Dr Rockey Situ to verify med changes.

## 2018-01-25 NOTE — Telephone Encounter (Signed)
Called and spoke with patient. Pt advised and voiced understanding.  

## 2018-01-26 LAB — COMPREHENSIVE METABOLIC PANEL
ALBUMIN: 3.6 g/dL (ref 3.6–4.8)
ALT: 33 IU/L (ref 0–44)
AST: 80 IU/L — ABNORMAL HIGH (ref 0–40)
Albumin/Globulin Ratio: 1.1 — ABNORMAL LOW (ref 1.2–2.2)
Alkaline Phosphatase: 102 IU/L (ref 39–117)
BUN / CREAT RATIO: 17 (ref 10–24)
BUN: 15 mg/dL (ref 8–27)
Bilirubin Total: 2.3 mg/dL — ABNORMAL HIGH (ref 0.0–1.2)
CO2: 23 mmol/L (ref 20–29)
Calcium: 9.9 mg/dL (ref 8.6–10.2)
Chloride: 95 mmol/L — ABNORMAL LOW (ref 96–106)
Creatinine, Ser: 0.89 mg/dL (ref 0.76–1.27)
GFR calc Af Amer: 107 mL/min/{1.73_m2} (ref >59)
GFR calc non Af Amer: 92 mL/min/{1.73_m2} (ref >59)
Globulin, Total: 3.3 g/dL (ref 1.5–4.5)
Glucose: 136 mg/dL — ABNORMAL HIGH (ref 65–99)
Potassium: 3.8 mmol/L (ref 3.5–5.2)
SODIUM: 136 mmol/L (ref 134–144)
Total Protein: 6.9 g/dL (ref 6.0–8.5)

## 2018-01-26 LAB — BASIC METABOLIC PANEL
BUN/Creatinine Ratio: 18 (ref 10–24)
BUN: 15 mg/dL (ref 8–27)
CO2: 23 mmol/L (ref 20–29)
CREATININE: 0.82 mg/dL (ref 0.76–1.27)
Calcium: 9.8 mg/dL (ref 8.6–10.2)
Chloride: 96 mmol/L (ref 96–106)
GFR calc Af Amer: 110 mL/min/{1.73_m2} (ref >59)
GFR calc non Af Amer: 95 mL/min/{1.73_m2} (ref >59)
Glucose: 139 mg/dL — ABNORMAL HIGH (ref 65–99)
Potassium: 3.8 mmol/L (ref 3.5–5.2)
SODIUM: 136 mmol/L (ref 134–144)

## 2018-01-26 LAB — TSH: TSH: 0.98 u[IU]/mL (ref 0.450–4.500)

## 2018-01-26 LAB — VITAMIN B12: Vitamin B-12: >2000 pg/mL — ABNORMAL HIGH (ref 232–1245)

## 2018-01-26 NOTE — Telephone Encounter (Signed)
Patient almost out of meds and pharmacy denied too soon refill.  Please call patient to discuss. He states dr Rockey Situ told him to play with dose and adjust it until he got to a strength that helped bp.  He has settled on 8 mg po BID and states it is working.    Please call in new rx per patient

## 2018-01-26 NOTE — Telephone Encounter (Signed)
Pt is calling regarding his Doxazosin, states he is almost out . Please call and inform the status.

## 2018-01-26 NOTE — Telephone Encounter (Signed)
Left voicemail message for patient to call back regarding medication. Back on 11/23/2017 Dr. Rockey Situ did approve Cardura 8 MG once daily or Cardura 4 mg twice daily. Patient now wanting Cardura 8 mg twice daily.

## 2018-01-28 NOTE — Telephone Encounter (Signed)
Ok to refill cardura 8 BID Would take bystolic off list if he is not taking Needs BP numbers on that high a dose of cardura Would also recommend some orthostatics at home, That high a dose can cause significant drops when standing

## 2018-01-29 ENCOUNTER — Other Ambulatory Visit (INDEPENDENT_AMBULATORY_CARE_PROVIDER_SITE_OTHER): Payer: Self-pay | Admitting: Family Medicine

## 2018-01-29 ENCOUNTER — Ambulatory Visit (INDEPENDENT_AMBULATORY_CARE_PROVIDER_SITE_OTHER): Payer: 59

## 2018-01-29 DIAGNOSIS — R0989 Other specified symptoms and signs involving the circulatory and respiratory systems: Secondary | ICD-10-CM

## 2018-01-29 DIAGNOSIS — J301 Allergic rhinitis due to pollen: Secondary | ICD-10-CM | POA: Diagnosis not present

## 2018-01-29 NOTE — Telephone Encounter (Signed)
Patient returning call.

## 2018-01-29 NOTE — Telephone Encounter (Signed)
No answer. Left message to call back.   

## 2018-01-30 ENCOUNTER — Other Ambulatory Visit: Payer: Self-pay | Admitting: Family Medicine

## 2018-01-30 DIAGNOSIS — R7309 Other abnormal glucose: Secondary | ICD-10-CM

## 2018-01-30 MED ORDER — DOXAZOSIN MESYLATE 8 MG PO TABS: 8.0000 mg | ORAL_TABLET | Freq: Two times a day (BID) | ORAL | 3 refills | Status: DC

## 2018-01-30 NOTE — Telephone Encounter (Signed)
Patient calling back. He verbalized understanding of Dr Donivan Scull recommendations to check BP orthostatic and on the high dose of Cardura. Patient state he has not been dizzy lately. He was appreciative. Rx sent to pharmacy.

## 2018-02-01 ENCOUNTER — Telehealth (INDEPENDENT_AMBULATORY_CARE_PROVIDER_SITE_OTHER): Payer: Self-pay | Admitting: *Deleted

## 2018-02-01 NOTE — Telephone Encounter (Signed)
Thank you for your online Notification/Prior Authorization submission.  The notification/prior authorization case information was transmitted on 02/01/2018 at 2:51 PM CST. The notification/prior authorization reference number is [id number].  PA is pending on [facility] website.

## 2018-02-01 NOTE — Telephone Encounter (Signed)
Ok to repeat, remind me about valium, send me message back

## 2018-02-05 ENCOUNTER — Other Ambulatory Visit (INDEPENDENT_AMBULATORY_CARE_PROVIDER_SITE_OTHER): Payer: Self-pay | Admitting: Physical Medicine and Rehabilitation

## 2018-02-05 ENCOUNTER — Telehealth (INDEPENDENT_AMBULATORY_CARE_PROVIDER_SITE_OTHER): Payer: Self-pay | Admitting: *Deleted

## 2018-02-05 DIAGNOSIS — F411 Generalized anxiety disorder: Secondary | ICD-10-CM

## 2018-02-05 MED ORDER — TRIAZOLAM 0.25 MG PO TABS: ORAL_TABLET | ORAL | 0 refills | Status: DC

## 2018-02-05 NOTE — Telephone Encounter (Signed)
Pt states triazolam. Please Advise.

## 2018-02-05 NOTE — Telephone Encounter (Signed)
Pt scheduled for 02/20/2018 with driver.

## 2018-02-05 NOTE — Telephone Encounter (Signed)
done

## 2018-02-05 NOTE — Progress Notes (Signed)
Pre-procedure triazolam ordered for pre-operative anxiety.  

## 2018-02-05 NOTE — Telephone Encounter (Signed)
I can only do standard dose or triazolam, see what he thinks

## 2018-02-06 ENCOUNTER — Other Ambulatory Visit: Payer: Self-pay | Admitting: Family Medicine

## 2018-02-06 NOTE — Telephone Encounter (Signed)
Called pt and lvm #1 

## 2018-02-06 NOTE — Telephone Encounter (Signed)
Controlled substance database reviewed. Sent to pharmacy.   

## 2018-02-07 ENCOUNTER — Telehealth: Payer: Self-pay | Admitting: Cardiovascular Disease

## 2018-02-07 ENCOUNTER — Ambulatory Visit: Payer: 59 | Admitting: Lab

## 2018-02-07 DIAGNOSIS — R7309 Other abnormal glucose: Secondary | ICD-10-CM

## 2018-02-07 DIAGNOSIS — J301 Allergic rhinitis due to pollen: Secondary | ICD-10-CM | POA: Diagnosis not present

## 2018-02-07 NOTE — Progress Notes (Signed)
  Pt here today for a A1c check reading which was 5.2

## 2018-02-07 NOTE — Telephone Encounter (Signed)
Received records request Alvie Heidelberg, forwarded to [facility] for processing. Faxed and scanned to [facility]

## 2018-02-08 ENCOUNTER — Other Ambulatory Visit: Payer: Self-pay | Admitting: Family Medicine

## 2018-02-08 DIAGNOSIS — I6523 Occlusion and stenosis of bilateral carotid arteries: Secondary | ICD-10-CM

## 2018-02-08 NOTE — Progress Notes (Signed)
I have reviewed the above note and agree.  Please inform the patient that his A1c is in the normal range.  Tommi Rumps, M.D.

## 2018-02-08 NOTE — Progress Notes (Signed)
Called Pt with results, he stated he understood with no questions

## 2018-02-20 ENCOUNTER — Ambulatory Visit (INDEPENDENT_AMBULATORY_CARE_PROVIDER_SITE_OTHER): Payer: 59 | Admitting: Physical Medicine and Rehabilitation

## 2018-02-20 ENCOUNTER — Ambulatory Visit (INDEPENDENT_AMBULATORY_CARE_PROVIDER_SITE_OTHER): Payer: Self-pay

## 2018-02-20 ENCOUNTER — Encounter (INDEPENDENT_AMBULATORY_CARE_PROVIDER_SITE_OTHER): Payer: Self-pay | Admitting: Physical Medicine and Rehabilitation

## 2018-02-20 VITALS — BP 167/73 | HR 74 | Temp 98.0°F

## 2018-02-20 DIAGNOSIS — M47816 Spondylosis without myelopathy or radiculopathy, lumbar region: Secondary | ICD-10-CM | POA: Diagnosis not present

## 2018-02-20 DIAGNOSIS — M545 Low back pain: Secondary | ICD-10-CM

## 2018-02-20 DIAGNOSIS — G8929 Other chronic pain: Secondary | ICD-10-CM | POA: Diagnosis not present

## 2018-02-20 MED ORDER — METHYLPREDNISOLONE ACETATE 80 MG/ML IJ SUSP
80.0000 mg | Freq: Once | INTRAMUSCULAR | Status: AC
  Administered 2018-02-20: 80 mg

## 2018-02-20 NOTE — Progress Notes (Signed)
 .  Numeric Pain Rating Scale and Functional Assessment Average Pain 5   In the last MONTH (on 0-10 scale) has pain interfered with the following?  1. General activity like being  able to carry out your everyday physical activities such as walking, climbing stairs, carrying groceries, or moving a chair?  Rating(3)   +Driver, +BT(eliquis, ok for inj), -Dye Allergies.

## 2018-02-20 NOTE — Progress Notes (Signed)
Peter [name] - 63 y.o. male MRN [id number]  Date of birth: 1955/03/14  Office Visit Note: Visit Date: 02/20/2018 PCP: [name], MD Referred by: [name], MD  Subjective: Chief Complaint  Patient presents with  . Lower Back - Pain  . Right Thigh - Pain  . Left Thigh - Pain   HPI: Peter [name] is a 63 y.o. male who comes in today For reevaluation of low back pain and bilateral hip pain.  Last injection 12/14/2017 gave him quite a bit of relief but lasted only a month and has had slow return of symptoms since that time.  He takes tramadol for pain relief.  This should be able to be managed by his primary care physician.  He is seeking disability and has gotten notes from Dr. Durward Fortes and myself.  MRI is reviewed again below showing moderate facet arthropathy with some disc bulging more to the left inconsistent with his symptoms.  No high-grade stenosis or focal nerve compression.  Progressive spondylosis over time.  Patient has tried chronic pain management but did not like the way this was approached.  Patient may do well with diagnostic medial branch blocks and radiofrequency ablation but does not want to go that route.  We will repeat the facet joint intra-articular blocks today.  He has had no new symptoms or focal weakness.  He has some level of anxiety and depression.  We did prescribe triazolam for preprocedure sedation and this seems to work better than the Valium.  ROS Otherwise per HPI.  Assessment & Plan: Visit Diagnoses:  1. Spondylosis without myelopathy or radiculopathy, lumbar region   2. Chronic bilateral low back pain without sciatica     Plan: No additional findings.   Meds & Orders:  Meds ordered this encounter  Medications  . methylPREDNISolone acetate (DEPO-MEDROL) injection 80 mg    Orders Placed This Encounter  Procedures  . Facet Injection  . XR C-ARM NO REPORT    Follow-up: Return if symptoms worsen or fail to improve.    Procedures: No procedures performed  Lumbar Facet Joint Intra-Articular Injection(s) with Fluoroscopic Guidance  Patient: Peter [name]      Date of Birth: 10-12-1955 MRN: [id number] PCP: [name], MD      Visit Date: 02/20/2018   Universal Protocol:    Date/Time: 02/20/2018  Consent Given By: the patient  Position: PRONE   Additional Comments: Vital signs were monitored before and after the procedure. Patient was prepped and draped in the usual sterile fashion. The correct patient, procedure, and site was verified.   Injection Procedure Details:  Procedure Site One Meds Administered:  Meds ordered this encounter  Medications  . methylPREDNISolone acetate (DEPO-MEDROL) injection 80 mg     Laterality: Bilateral  Location/Site:  L4-L5  Needle size: 22 guage  Needle type: Spinal  Needle Placement: Articular  Findings:  -Comments: Excellent flow of contrast producing a partial arthrogram.  Procedure Details: The fluoroscope beam is vertically oriented in AP, and the inferior recess is visualized beneath the lower pole of the inferior apophyseal process, which represents the target point for needle insertion. When direct visualization is difficult the target point is located at the medial projection of the vertebral pedicle. The region overlying each aforementioned target is locally anesthetized with a 1 to 2 ml. volume of 1% Lidocaine without Epinephrine.   The spinal needle was inserted into each of the above mentioned facet joints using biplanar fluoroscopic guidance. A 0.25  to 0.5 ml. volume of Isovue-250 was injected and a partial facet joint arthrogram was obtained. A single spot film was obtained of the resulting arthrogram.    One to 1.25 ml of the steroid/anesthetic solution was then injected into each of the facet joints noted above.   Additional Comments:  The patient tolerated the procedure well Dressing: 2 x 2 sterile gauze and Band-Aid     Post-procedure details: Patient was observed during the procedure. Post-procedure instructions were reviewed.  Patient left the clinic in stable condition.    Clinical History: MRI LUMBAR SPINE WITHOUT CONTRAST  TECHNIQUE: Multiplanar, multisequence MR imaging of the lumbar spine was performed. No intravenous contrast was administered.  COMPARISON:  MRI lumbar spine 10/11/2014.  FINDINGS: Segmentation:  Standard.  Alignment:  Maintained.  Vertebrae: No fracture or worrisome lesion. Marrow signal is mildly heterogeneous with scattered degenerative endplate signal change appearing worst at L3-4.  Conus medullaris and cauda equina: Conus extends to the L1 level. Conus and cauda equina appear normal.  Paraspinal and other soft tissues: Negative.  Disc levels:  T11-12 is imaged in the sagittal plane only. There is a minimal disc bulge but no central canal or foraminal narrowing.  T12-L1: Negative.  L1-2: Negative.  L2-3: Shallow disc bulge with some endplate spurring. There is mild central canal and foraminal narrowing. No nerve root compression. The appearance is not markedly changed.  L3-4: Shallow broad-based disc bulge is somewhat more prominent to the left. There is some narrowing in the left lateral recess. Mild to moderate foraminal narrowing is more notable on the left. Extraforaminal disc contacts the exited L3 roots. The appearance of this level is unchanged.  L4-5: The patient has a shallow disc bulge. More focally protruding disc within and beyond the left foramen is more prominent than on the prior exam and contacts the exiting and exited left L4 root. Moderate bilateral foraminal narrowing is seen. There is mild narrowing in the left lateral recess. Moderate facet arthropathy is noted.  L5-S1: Shallow disc bulge without stenosis.  IMPRESSION: Progressive degenerative disease at L4-5 where a disc bulge eccentrically prominent to  the left and more focally protruding disc within and beyond the left foramen has increased in size. Disc contacts the exiting and exited left L4 root. There is also some narrowing in the left lateral recess at this level. The appearance lumbar spine is otherwise unchanged.  Extraforaminal disc contacts the exited L3 roots bilaterally at L3-4, unchanged.  Mild central canal and bilateral foraminal narrowing at L2-3, unchanged.   Electronically Signed   By: Inge Rise M.D.   On: 04/11/2017 20:33   He reports that he has quit smoking. His smoking use included cigarettes. He has a 2.40 pack-year smoking history. He has never used smokeless tobacco. No results for input(s): HGBA1C, LABURIC in the last 8760 hours.  Objective:  VS:  HT:    WT:   BMI:     BP:(!) 167/73  HR:74bpm  TEMP:98 F (36.7 C)(Oral)  RESP:  Physical Exam  Ortho Exam Imaging: No results found.  Past Medical/Family/Surgical/Social History: Medications & Allergies reviewed per EMR, new medications updated. Patient Active Problem List   Diagnosis Date Noted  . Alcohol withdrawal ([place]) 03/08/2018  . Carotid artery stenosis 03/06/2018  . Fatigue 01/23/2018  . Bruit of right carotid artery 01/23/2018  . Lightheadedness 01/23/2018  . Neck pain 01/23/2018  . Bronchitis 12/27/2017  . BPH (benign prostatic hyperplasia) 11/08/2017  . Rheumatoid factor positive 10/18/2017  . Hypomagnesemia  10/18/2017  . Chronic anticoagulation (ELIQUIS) 10/18/2017  . Decreased shoulder range of motion (ROM) (Left) 10/18/2017  . Tendinopathy of rotator cuff (Left) 10/18/2017  . Labral tear of shoulder, sequela (Left) 10/18/2017  . Strain of subscapularis muscle, sequela (Left) 10/18/2017  . Chronic knee pain (Right) 10/18/2017  . Arthropathy of knee (Right) 10/18/2017  . Enthesopathy of knee region (Right) 10/18/2017  . Osteoarthritis of knee (Right) 10/18/2017  . Lumbar spondylosis 10/18/2017  . DDD (degenerative  disc disease), lumbar 10/18/2017  . Lumbar facet arthropathy 10/18/2017  . Lumbar facet syndrome 10/18/2017  . Lumbar lateral recess stenosis (Left: L3-4, L4-5) 10/18/2017  . Lumbar central spinal stenosis (L2-3) 10/18/2017  . Lumbar foraminal stenosis 10/18/2017  . History of esophageal stricture 10/18/2017  . Chronic low back pain (Primary Area of Pain) (Bilateral) (L>R) w/ sciatica (Bilateral) 09/28/2017  . Chronic lower extremity pain (Secondary Area of Pain) (Bilateral) (L>R) 09/28/2017  . Chronic pain syndrome 09/28/2017  . Long term current use of opiate analgesic 09/28/2017  . Pharmacologic therapy 09/28/2017  . Disorder of skeletal system 09/28/2017  . Problems influencing health status 09/28/2017  . Chronic thoracic back pain [facility]) ([place]) 09/28/2017  . Atypical chest pain 09/12/2017  . Chronic shoulder pain (Left) 06/12/2017  . Allergic rhinitis 04/24/2017  . Thrombocytopenia ([place]) 01/23/2017  . Polypharmacy 01/20/2017  . Syncope 01/19/2017  . Elevated troponin 01/19/2017  . Sinusitis 06/08/2016  . Tremor 03/23/2016  . Paresthesia 03/23/2016  . Anxiety 02/23/2016  . Typical atrial flutter ([place]) 09/21/2015  . Coronary artery disease of native artery of native heart with stable angina pectoris ([place]) 05/06/2015  . Aortic atherosclerosis ([place]) 03/20/2015  . History of alcohol abuse 02/04/2015  . Alcohol induced fatty liver 02/04/2015  . Obesity (BMI 30.0-34.9) 02/04/2015  . History of prostate cancer 02/04/2015  . Chronic venous insufficiency 10/12/2012  . Lesion of ulnar nerve 09/03/2012  . Tobacco abuse 04/05/2011  . Hyperlipidemia 01/21/2010  . Essential hypertension 12/16/2009  . Premature beats 12/16/2009   Past Medical History:  Diagnosis Date  . A-fib ([place])   . Abnormality of gait 09/03/2012  . Alcohol abuse   . Anxiety   . Arthritis   . Arthritis   . Esophageal stricture   . GERD (gastroesophageal reflux disease)   . Gout   .  Hyperlipidemia   . Hypertension   . Internal hemorrhoids   . Lesion of ulnar nerve 09/03/2012   Right ulnar neuropathy  . Murmur, cardiac   . PAF (paroxysmal atrial fibrillation) ([place])   . Palpitations   . Prostate cancer ([place]) 2016   treated with radioactive seed implant  . Seizures ([place])    pt states he had seizure 12/14  . Tubular adenoma of colon 03/2008   Family History  Problem Relation Age of Onset  . Diabetes Father   . Kidney disease Father   . Lung cancer Father        smoker  . Hyperlipidemia Father   . Hypertension Father   . Lung cancer Mother        smoke  . Hypertension Mother   . Colon cancer Neg Hx   . Pancreatic cancer Neg Hx   . Stomach cancer Neg Hx    Past Surgical History:  Procedure Laterality Date  . CATARACT EXTRACTION W/PHACO Right 07/03/2014   Procedure: CATARACT EXTRACTION PHACO AND INTRAOCULAR LENS PLACEMENT (IOC);  Surgeon: Lyla Glassing, MD;  Location: [facility] ORS;  Service: Ophthalmology;  Laterality: Right;  lot # [id number] H [place]: 00:32.3 AP% 9.1 CDE: 2.92  . CIRCUMCISION    . COLONOSCOPY    . ELBOW SURGERY    . ELECTROPHYSIOLOGIC STUDY N/A 11/02/2015   Procedure: CARDIOVERSION;  Surgeon: Minna Merritts, MD;  Location: [facility] ORS;  Service: Cardiovascular;  Laterality: N/A;  . ESOPHAGOGASTRODUODENOSCOPY (EGD) WITH PROPOFOL N/A 05/17/2016   Procedure: ESOPHAGOGASTRODUODENOSCOPY (EGD) WITH PROPOFOL;  Surgeon: Lucilla Lame, MD;  Location: [facility] ENDOSCOPY;  Service: Endoscopy;  Laterality: N/A;  . EYE SURGERY    . HAND SURGERY Left   . SHOULDER ARTHROSCOPY Left   . SHOULDER SURGERY     Social History   Occupational History  . Occupation: Retired    Fish farm manager: Economist  . Occupation: UTILITIES OPER.    Employer: [employer]  Tobacco Use  . Smoking status: Former Smoker    Packs/day: 0.10    Years: 24.00    Pack years: 2.40    Types: Cigarettes  . Smokeless tobacco: Never Used  . Tobacco comment: down to 4-5  ciggs a day.  Substance and  Sexual Activity  . Alcohol use: Yes    Alcohol/week: 2.0 standard drinks    Types: 2 Cans of beer per week    Frequency: Never    Comment: 4 beers daily   . Drug use: No    Types: Marijuana    Comment: 1970's occ.   Marland Kitchen Sexual activity: Not on file

## 2018-03-01 DIAGNOSIS — J301 Allergic rhinitis due to pollen: Secondary | ICD-10-CM | POA: Diagnosis not present

## 2018-03-02 ENCOUNTER — Other Ambulatory Visit: Payer: Self-pay | Admitting: Family Medicine

## 2018-03-02 NOTE — Telephone Encounter (Signed)
Controlled substance database reviewed. Sent to pharmacy.   

## 2018-03-04 NOTE — Progress Notes (Deleted)
Cardiology Office Note  Date:  03/04/2018   ID:  Peter [name], Peter [name] 04/10/1955, MRN [id number]  PCP:  [name], MD   No chief complaint on file.   HPI:  Peter [name] is a 63 year old gentleman with  long history of smoking, 4 cigs a day hypertension,  daily alcohol drinking,  prostate cancer ,  statin intolerance ,    underlying liver dysfunction, elevated total bilirubin CAD, CT coronary calcium score 2778 in 04/2015 Stress test 07/2014 no ischemia Cardioversion 11/02/2015 For atrial flutter tolerating praluent , dramatic improvement in his cholesterol down to 160 from 260 who presents for follow-up of his coronary artery disease, hypertension, hyperlipidemia  3 weeks ago, Chest tightness, Went to the ER 08/26/2017 TNT 0.05  Had shoulder surgery, was having pain, Good recovery Residual pain  Blood pressure up and down O/n was 190, took extra cardura  Down to 2 cigs a day  Dr. [name] to do balloon dilation  Lots of anxiety at home  Wife having trouble with her job  He is taking bystolic in the morning  taking Cardura 1 mg in the evening Blood pressure does not seem to hold overnight  Regular exercise program  Try to eat better No falls Previous fall with alcohol and clonazepam Continues to have tremor  EKG personally reviewed by myself on todays visit Shows normal sinus rhythm rate 52 bpm no significant ST or T-wave changes  Other past medical history reviewed Previously passed out, hit his face on the left Was in the hospital, blood thinners held overnight 01/19/2017  anticoagulation held until platelets improved  hemoglobin of 9 down from  13    Echo January 2019  EF normal >65%  Previous EGD with Dr. [name],  Essentially normal procedure, no sign of bleeding  echocardiogram 02/2016 showing normal LV function  Chronic back pain He went to the ER for back pain 11/22/2015 Started On tramadol PRN  Limited by back pain , was told he could get  a "shot" "shaking" for 2 years since prostate seeds, stopped librium Still with heavy ETOH, previous notes indicating 4-5 beers per day Frequent nocturia Has been taking lisinopril 40 mg x 2, presumably because blood pressure was running high, now running out of his medication. Did not call our office  Previous stress test July 2016 showing no ischemia CT scan of the chest, CT coronary calcium score discussed with him in detail, images pulled up in the office. His scores close to 3000. He denies any anginal symptoms. Images show diffuse heavy calcification/calcified atherosclerosis in the LAD, diagonal branch, ostial and proximal RCA. No significant disease noted in the left circumflex.   he reports being debilitated when he was on Crestor, Lipitor He had diffuse muscle pain in his legs, has tried different doses, does not want to retry a statin given the debilitating side effects. Feels it made it impossible to get around, on top of his arthritis, severe knee pain, it was just too much  CT scan of the abdomen shows scattered diffuse mild aortic atherosclerosis.  Lab work reviewed with him showing hemoglobin A1c 5.5 , total cholesterol more than 230   PMH:   has a past medical history of A-fib ([place]), Abnormality of gait (09/03/2012), Alcohol abuse, Anxiety, Arthritis, Arthritis, Esophageal stricture, GERD (gastroesophageal reflux disease), Gout, Hyperlipidemia, Hypertension, Internal hemorrhoids, Lesion of ulnar nerve (09/03/2012), Murmur, cardiac, PAF (paroxysmal atrial fibrillation) ([place]), Palpitations, Prostate cancer ([place]) (2016), Seizures ([place]), and Tubular adenoma of colon (03/2008).  PSH:    Past Surgical History:  Procedure Laterality Date  . CATARACT EXTRACTION W/PHACO Right 07/03/2014   Procedure: CATARACT EXTRACTION PHACO AND INTRAOCULAR LENS PLACEMENT (IOC);  Surgeon: Lyla Glassing, MD;  Location: [facility] ORS;  Service: Ophthalmology;  Laterality: Right;  lot # [id number] H [place]:  00:32.3 AP% 9.1 CDE: 2.92  . CIRCUMCISION    . COLONOSCOPY    . ELBOW SURGERY    . ELECTROPHYSIOLOGIC STUDY N/A 11/02/2015   Procedure: CARDIOVERSION;  Surgeon: Minna Merritts, MD;  Location: [facility] ORS;  Service: Cardiovascular;  Laterality: N/A;  . ESOPHAGOGASTRODUODENOSCOPY (EGD) WITH PROPOFOL N/A 05/17/2016   Procedure: ESOPHAGOGASTRODUODENOSCOPY (EGD) WITH PROPOFOL;  Surgeon: Lucilla Lame, MD;  Location: [facility] END[facility];  Service: Endoscopy;  Laterality: N/A;  . EYE SURGERY    . HAND SURGERY Left   . SHOULDER ARTHROSCOPY Left   . SHOULDER SURGERY      Current Outpatient Medications  Medication Sig Dispense Refill  . albuterol (PROVENTIL HFA;VENTOLIN HFA) 108 (90 Base) MCG/ACT inhaler Inhale 2 puffs into the lungs every 6 (six) hours as needed for wheezing or shortness of breath. 1 Inhaler 0  . Alirocumab (PRALUENT) 150 MG/ML SOAJ Inject 1 pen into the skin every 14 (fourteen) days. 2 pen 11  . apixaban (ELIQUIS) 5 MG TABS tablet Take 5 mg by mouth 2 (two) times daily.    . B Complex-C (B-COMPLEX WITH VITAMIN C) tablet Take 1 tablet by mouth daily.    . busPIRone (BUSPAR) 10 MG tablet TAKE ONE TABLET BY MOUTH THREE TIMES A DAY 90 tablet 2  . doxazosin (CARDURA) 8 MG tablet Take 1 tablet (8 mg total) by mouth 2 (two) times daily. 180 tablet 3  . doxycycline (VIBRA-TABS) 100 MG tablet Take 1 tablet (100 mg total) by mouth 2 (two) times daily. 14 tablet 0  . FOLIC ACID PO Take by mouth daily.    . hydrochlorothiazide (HYDRODIURIL) 12.5 MG tablet Take 1 tablet (12.5 mg total) by mouth daily. In the am for blood pressure 30 tablet 2  . Magnesium 500 MG CAPS Take 1 capsule (500 mg total) by mouth 2 (two) times daily at 8 am and 10 pm. 60 capsule 5  . Melatonin 10 MG TABS Take 1 tablet by mouth at bedtime.    . milk thistle 175 MG tablet Take 175 mg by mouth daily.    . Multiple Vitamin (MULTIVITAMIN) tablet Take 1 tablet by mouth daily. Centrum Plus -Take 1 daily    . nitroGLYCERIN  (NITROSTAT) 0.4 MG SL tablet Place 1 tablet (0.4 mg total) under the tongue every 5 (five) minutes as needed for chest pain. 25 tablet 3  . [START ON 03/08/2018] traMADol (ULTRAM) 50 MG tablet TAKE ONE TABLET BY MOUTH EVERY 12 HOURS AS NEEDED FOR MODERATE PAIN 60 tablet 0  . triamcinolone cream (KENALOG) 0.1 % Apply 1 application topically daily as needed.     . triazolam (HALCION) 0.25 MG tablet Take 1 tab by mouth 1 hour prior to procedure with very light food, do not drive motor vehicle. 2 tablet 0  . VOLTAREN 1 % GEL Apply 2 g topically as needed (knees).      Current Facility-Administered Medications  Medication Dose Route Frequency Provider Last Rate Last Dose  . methylPREDNISolone acetate (DEPO-MEDROL) injection 80 mg  80 mg Other Once Magnus Sinning, MD         Allergies:   Clonazepam; Crestor [rosuvastatin]; Dust mite extract; Gabapentin; Hydrocodone bitartrate er; Lipitor [atorvastatin]; Lisinopril; and Topamax [  topiramate]   Social History:  The patient  reports that he has quit smoking. His smoking use included cigarettes. He has a 2.40 pack-year smoking history. He has never used smokeless tobacco. He reports current alcohol use of about 2.0 standard drinks of alcohol per week. He reports that he does not use drugs.   Family History:   family history includes Diabetes in his father; Hyperlipidemia in his father; Hypertension in his father and mother; Kidney disease in his father; Lung cancer in his father and mother.    Review of Systems: Review of Systems  Constitutional: Negative.   Respiratory: Negative.   Cardiovascular: Negative.   Gastrointestinal: Negative.   Musculoskeletal: Negative.   Psychiatric/Behavioral: The patient is nervous/anxious and has insomnia.   All other systems reviewed and are negative.    PHYSICAL EXAM: VS:  There were no vitals taken for this visit. , BMI There is no height or weight on file to calculate BMI.  No significant change compared to  previous visit Constitutional:  oriented to person, place, and time. No distress.  HENT:  Head: Normocephalic and atraumatic.  Eyes:  no discharge. No scleral icterus.  Neck: Normal range of motion. Neck supple. No JVD present.  Cardiac: RRR; 1-2+ SEM RSB murmurs, rubs, or gallops, No edema No murmur heard. Pulmonary/Chest: Effort normal and breath sounds normal. No stridor. No respiratory distress.  no wheezes.  no rales.  no tenderness.  Abdominal: Soft.  no distension.  no tenderness.  Musculoskeletal: Normal range of motion.  no  tenderness or deformity.  Neurological:  normal muscle tone. Coordination normal. No atrophy Skin: Skin is warm and dry. No rash noted. not diaphoretic.  Psychiatric:  normal mood and affect. behavior is normal. Thought content normal.   Recent Labs: 10/02/2017: Magnesium 1.3 01/04/2018: Hemoglobin 12.5; Platelets 68 01/25/2018: ALT 33; BUN 15; Creatinine, Ser 0.82; Potassium 3.8; Sodium 136; TSH 0.980    Lipid Panel Lab Results  Component Value Date   CHOL 147 09/18/2017   HDL 65 09/18/2017   LDLCALC 56 09/18/2017   TRIG 132 09/18/2017      Wt Readings from Last 3 Encounters:  01/23/18 258 lb (117 kg)  12/27/17 255 lb 3.2 oz (115.8 kg)  11/10/17 252 lb (114.3 kg)      ASSESSMENT AND PLAN:   Essential hypertension - Plan: EKG 12-Lead Long discussion concerning his blood pressure Will avoid clonidine given symptoms of fatigue  Previous history of leg edema, will avoid calcium channel blockers Did not tolerate isosorbide in the past Off lisinopril given his reported symptoms of leg swelling Recommend he stay on beta-blocker in the morning and increase the Cardura up to 2 mg in the evening Labile blood pressure likely exacerbated by anxiety  Pure hypercholesterolemia - Plan: EKG 12-Lead Tolerating  praluent Goal LDL less than 70  Order placed for liver and lipid  Typical atrial flutter ([place]) - Plan: EKG 12-Lead Maintaining normal sinus  rhythm  Continue anticoagulation Stable  Coronary artery disease involving native coronary artery of native heart without angina pectoris - Plan: EKG 12-Lead Recent episodes of chest pain Likely secondary to GI etiology.  At rest Nitro given for possible esophageal spasm Recommend he proceed with esophageal dilatation with Dr. [name]  Anxiety/fatigue/depression insomnia Previously was on Ambien History of alcohol abuse Having more anxiety concerning finances  Alcohol use Not discussed with him today Chronic tremor, affecting his sleep  Smoke  down to 2 Cigarettes per day Discussed with him in detail  Total encounter time more than 45 minutes  Greater than 50% was spent in counseling and coordination of care with the patient   Disposition:   F/U  12 months   No orders of the defined types were placed in this encounter.    Signed, Esmond Plants, M.D., Ph.D. 03/04/2018  [place], [employer]

## 2018-03-05 ENCOUNTER — Ambulatory Visit: Payer: 59 | Admitting: Cardiovascular Disease

## 2018-03-06 ENCOUNTER — Ambulatory Visit: Payer: 59

## 2018-03-06 ENCOUNTER — Ambulatory Visit: Payer: 59 | Admitting: Internal Medicine

## 2018-03-06 ENCOUNTER — Encounter: Payer: Self-pay | Admitting: Internal Medicine

## 2018-03-06 VITALS — BP 168/72 | HR 82 | Temp 97.6°F | Ht 73.0 in | Wt 263.6 lb

## 2018-03-06 DIAGNOSIS — F1011 Alcohol abuse, in remission: Secondary | ICD-10-CM

## 2018-03-06 DIAGNOSIS — Z91148 Patient's other noncompliance with medication regimen for other reason: Secondary | ICD-10-CM

## 2018-03-06 DIAGNOSIS — J321 Chronic frontal sinusitis: Secondary | ICD-10-CM | POA: Diagnosis not present

## 2018-03-06 DIAGNOSIS — G894 Chronic pain syndrome: Secondary | ICD-10-CM

## 2018-03-06 DIAGNOSIS — I10 Essential (primary) hypertension: Secondary | ICD-10-CM | POA: Diagnosis not present

## 2018-03-06 DIAGNOSIS — R519 Headache, unspecified: Secondary | ICD-10-CM

## 2018-03-06 DIAGNOSIS — Z9114 Patient's other noncompliance with medication regimen: Secondary | ICD-10-CM

## 2018-03-06 DIAGNOSIS — I6523 Occlusion and stenosis of bilateral carotid arteries: Secondary | ICD-10-CM

## 2018-03-06 DIAGNOSIS — R51 Headache: Secondary | ICD-10-CM | POA: Diagnosis not present

## 2018-03-06 DIAGNOSIS — I6529 Occlusion and stenosis of unspecified carotid artery: Secondary | ICD-10-CM | POA: Insufficient documentation

## 2018-03-06 MED ORDER — DOXYCYCLINE HYCLATE 100 MG PO TABS: 100.0000 mg | ORAL_TABLET | Freq: Two times a day (BID) | ORAL | 0 refills | Status: DC

## 2018-03-06 NOTE — Patient Instructions (Addendum)
Cut bystolic in 1/2 pill=5 mg for x 1 week and then increase to 10 mg daily like previously -If your blood pressure is <130/<80 that's good it <90/<60 too low and so consider cut bystolic in 1/2 =5 mg consistently     Call Dr. Lucky Cowboy to schedule follow up appt    Sinusitis, Adult Sinusitis is inflammation of your sinuses. Sinuses are hollow spaces in the bones around your face. Your sinuses are located:  Around your eyes.  In the middle of your forehead.  Behind your nose.  In your cheekbones. Mucus normally drains out of your sinuses. When your nasal tissues become inflamed or swollen, mucus can become trapped or blocked. This allows bacteria, viruses, and fungi to grow, which leads to infection. Most infections of the sinuses are caused by a virus. Sinusitis can develop quickly. It can last for up to 4 weeks (acute) or for more than 12 weeks (chronic). Sinusitis often develops after a cold. What are the causes? This condition is caused by anything that creates swelling in the sinuses or stops mucus from draining. This includes:  Allergies.  Asthma.  Infection from bacteria or viruses.  Deformities or blockages in your nose or sinuses.  Abnormal growths in the nose (nasal polyps).  Pollutants, such as chemicals or irritants in the air.  Infection from fungi (rare). What increases the risk? You are more likely to develop this condition if you:  Have a weak body defense system (immune system).  Do a lot of swimming or diving.  Overuse nasal sprays.  Smoke. What are the signs or symptoms? The main symptoms of this condition are pain and a feeling of pressure around the affected sinuses. Other symptoms include:  Stuffy nose or congestion.  Thick drainage from your nose.  Swelling and warmth over the affected sinuses.  Headache.  Upper toothache.  A cough that may get worse at night.  Extra mucus that collects in the throat or the back of the nose (postnasal  drip).  Decreased sense of smell and taste.  Fatigue.  A fever.  Sore throat.  Bad breath. How is this diagnosed? This condition is diagnosed based on:  Your symptoms.  Your medical history.  A physical exam.  Tests to find out if your condition is acute or chronic. This may include: ? Checking your nose for nasal polyps. ? Viewing your sinuses using a device that has a light (endoscope). ? Testing for allergies or bacteria. ? Imaging tests, such as an MRI or CT scan. In rare cases, a bone biopsy may be done to rule out more serious types of fungal sinus disease. How is this treated? Treatment for sinusitis depends on the cause and whether your condition is chronic or acute.  If caused by a virus, your symptoms should go away on their own within 10 days. You may be given medicines to relieve symptoms. They include: ? Medicines that shrink swollen nasal passages (topical intranasal decongestants). ? Medicines that treat allergies (antihistamines). ? A spray that eases inflammation of the nostrils (topical intranasal corticosteroids). ? Rinses that help get rid of thick mucus in your nose (nasal saline washes).  If caused by bacteria, your health care provider may recommend waiting to see if your symptoms improve. Most bacterial infections will get better without antibiotic medicine. You may be given antibiotics if you have: ? A severe infection. ? A weak immune system.  If caused by narrow nasal passages or nasal polyps, you may need to have  surgery. Follow these instructions at home: Medicines  Take, use, or apply over-the-counter and prescription medicines only as told by your health care provider. These may include nasal sprays.  If you were prescribed an antibiotic medicine, take it as told by your health care provider. Do not stop taking the antibiotic even if you start to feel better. Hydrate and humidify   Drink enough fluid to keep your urine pale yellow.  Staying hydrated will help to thin your mucus.  Use a cool mist humidifier to keep the humidity level in your home above 50%.  Inhale steam for 10-15 minutes, 3-4 times a day, or as told by your health care provider. You can do this in the bathroom while a hot shower is running.  Limit your exposure to cool or dry air. Rest  Rest as much as possible.  Sleep with your head raised (elevated).  Make sure you get enough sleep each night. General instructions   Apply a warm, moist washcloth to your face 3-4 times a day or as told by your health care provider. This will help with discomfort.  Wash your hands often with soap and water to reduce your exposure to germs. If soap and water are not available, use hand sanitizer.  Do not smoke. Avoid being around people who are smoking (secondhand smoke).  Keep all follow-up visits as told by your health care provider. This is important. Contact a health care provider if:  You have a fever.  Your symptoms get worse.  Your symptoms do not improve within 10 days. Get help right away if:  You have a severe headache.  You have persistent vomiting.  You have severe pain or swelling around your face or eyes.  You have vision problems.  You develop confusion.  Your neck is stiff.  You have trouble breathing. Summary  Sinusitis is soreness and inflammation of your sinuses. Sinuses are hollow spaces in the bones around your face.  This condition is caused by nasal tissues that become inflamed or swollen. The swelling traps or blocks the flow of mucus. This allows bacteria, viruses, and fungi to grow, which leads to infection.  If you were prescribed an antibiotic medicine, take it as told by your health care provider. Do not stop taking the antibiotic even if you start to feel better.  Keep all follow-up visits as told by your health care provider. This is important. This information is not intended to replace advice given to you by  your health care provider. Make sure you discuss any questions you have with your health care provider. Document Released: 12/27/2004 Document Revised: 05/29/2017 Document Reviewed: 05/29/2017 Elsevier Interactive Patient Education  2019 [employer]   Carotid Artery Disease both carotids blocked 60-79%   The carotid arteries are the two main arteries on either side of the neck. They supply blood to the brain, face, and neck. Carotid artery disease, also called carotid artery stenosis, is the narrowing or blockage of one or both carotid arteries. Carotid artery disease increases your risk for a stroke or a transient ischemic attack (TIA). A TIA is an episode in which blood flow to the brain is temporarily blocked. A TIA is considered a "warning stroke." What are the causes? This condition is primarily caused by buildup of plaque inside the carotid arteries (atherosclerosis). What increases the risk? The following factors may make you more likely to develop this condition:  High cholesterol (dyslipidemia).  High blood pressure (hypertension).  Smoking.  Obesity.  Diabetes.  Family history of cardiovascular disease.  Inactivity or lack of regular exercise.  Being male. Men have an increased risk of developing atherosclerosis earlier in life than women.  Old age. What are the signs or symptoms? This condition may not have any signs or symptoms until a stroke or TIA occurs. In some cases, your doctor may be able to hear a whooshing sound (bruit) which can indicate a change in blood flow due to plaque buildup. An eye exam can also help identify signs of the condition. How is this diagnosed? This condition may be diagnosed by:  A physical exam.  Your family and medical history.  Specific tests that look at the blood flow in the carotid arteries. These tests include: ? Carotid artery ultrasound. This test uses sound waves to create pictures of your arteries and show whether they  are narrow or blocked. ? Carotid or cerebral angiography. During this test, a special dye will be injected into a vein. The dye will show up on an X-ray to help highlight your arteries. ? Computerized tomographic angiography (CTA). During this test, a special dye will be injected into a vein. The dye will show up on the CT scan to help highlight your arteries. ? Magnetic resonance angiography (MRA). During this test, a special dye will be injected into a vein. The dye will show up on the MRI to help highlight your arteries. How is this treated? Treatment of this condition may include a combination of treatments. Treatment options include:  Lifestyle changes such as: ? Quitting smoking. ? Exercising regularly or as directed by your health care provider. ? Eating a heart-healthy diet. ? Managing stress. ? Maintaining a healthy weight.  Medicines to control: ? Blood pressure. ? Cholesterol. ? Blood clotting.  Surgery. You may have: ? A carotid endarterectomy. This is a surgery to remove the blockages in the carotid arteries. ? A carotid angioplasty with stenting. This is a procedure in which a wire mesh (stent) is used to widen the blocked carotid arteries. Follow these instructions at home: Lifestyle  Follow your health care provider's instructions about your diet. It is important to eat a healthy diet that is low in saturated fats and includes plenty of fresh fruits, vegetables, and lean meats. You should avoid high-fat, high-sodium foods as well as foods that are fried, overly processed, or have poor nutritional value.  Maintain a healthy weight.  Stay physically active. It is recommended that you get at least 150 minutes of moderate exercise or 75 minutes of strenuous exercise each week.  Do not use any products that contain nicotine or tobacco, such as cigarettes and e-cigarettes. If you need help quitting, ask your health care provider.  Limit alcohol intake to no more than 1  drink a day for non-pregnant women and 2 drinks a day for men. One drink equals 12 oz. of beer, 5 oz. of wine, or 1 oz. of hard liquor.  Do not use illegal drugs.  Manage your stress. Ask your health care provider for stress management tips. General instructions  Take over-the-counter and prescription medicines only as told by your health care provider.  Keep all follow-up visits as told by your health care provider. This is important. Get help right away if:  You have any symptoms of stroke or TIA. The acronym BEFAST is an easy way to remember the main warning signs of stroke. ? B = Balance problems. Signs include dizziness, sudden trouble walking, or loss of balance ? E = Eye  problems. This includes trouble seeing or a sudden change in vision. ? F = Face changes. This includes sudden weakness or numbness of the face, or the face or eyelid drooping to one side. ? A = Arm weakness or numbness. This happens suddenly and usually on one side of the body. ? S = Speech problems. This includes trouble speaking or trouble understanding speech. ? T = Time. Time to call 911 or seek emergency care. Do not wait to see if symptoms go away. Make note of the time your symptoms started.  Other signs of stroke may include: ? A sudden, severe headache with no known cause. ? Nausea or vomiting. ? Seizure. These symptoms may represent a serious problem that is an emergency. Do not wait to see if the symptoms will go away. Get medical help right away. Call your local emergency services (911 in the [place] Do not drive yourself to the hospital. Summary  Carotid artery disease, also called carotid artery stenosis, is the narrowing or blockage of one or both carotid arteries.  Carotid artery disease increases your risk for a stroke or a transient ischemic attack (TIA).  This condition can be treated with lifestyle changes, medicines, surgery, or a combination of these treatments.  Do not use any products  that contain nicotine or tobacco, such as cigarettes and e-cigarettes. If you need help quitting, ask your health care provider. This information is not intended to replace advice given to you by your health care provider. Make sure you discuss any questions you have with your health care provider. Document Released: 03/21/2011 Document Revised: 02/01/2016 Document Reviewed: 02/01/2016 Elsevier Interactive Patient Education  2019 [place] Headache Without Cause A headache is pain or discomfort felt around the head or neck area. The specific cause of a headache may not be found. There are many causes and types of headaches. A few common ones are:  Tension headaches.  Migraine headaches.  Cluster headaches.  Chronic daily headaches. Follow these instructions at home: Watch your condition for any changes. Let your health care provider know about them. Take these steps to help with your condition: Managing pain      Take over-the-counter and prescription medicines only as told by your health care provider.  Lie down in a dark, quiet room when you have a headache.  If directed, put ice on your head and neck area: ? Put ice in a plastic bag. ? Place a towel between your skin and the bag. ? Leave the ice on for 20 minutes, 2-3 times per day.  If directed, apply heat to the affected area. Use the heat source that your health care provider recommends, such as a moist heat pack or a heating pad. ? Place a towel between your skin and the heat source. ? Leave the heat on for 20-30 minutes. ? Remove the heat if your skin turns bright red. This is especially important if you are unable to feel pain, heat, or cold. You may have a greater risk of getting burned.  Keep lights dim if bright lights bother you or make your headaches worse. Eating and drinking  Eat meals on a regular schedule.  If you drink alcohol: ? Limit how much you use to:  0-1 drink a day for women.  0-2  drinks a day for men. ? Be aware of how much alcohol is in your drink. In the [place] one drink equals one 12 oz bottle of beer (355 mL), one 5 oz  glass of wine (148 mL), or one 1 oz glass of hard liquor (44 mL).  Stop drinking caffeine, or decrease the amount of caffeine you drink. General instructions   Keep a headache journal to help find out what may trigger your headaches. For example, write down: ? What you eat and drink. ? How much sleep you get. ? Any change to your diet or medicines.  Try massage or other relaxation techniques.  Limit stress.  Sit up straight, and do not tense your muscles.  Do not use any products that contain nicotine or tobacco, such as cigarettes, e-cigarettes, and chewing tobacco. If you need help quitting, ask your health care provider.  Exercise regularly as told by your health care provider.  Sleep on a regular schedule. Get 7-9 hours of sleep each night, or the amount recommended by your health care provider.  Keep all follow-up visits as told by your health care provider. This is important. Contact a health care provider if:  Your symptoms are not helped by medicine.  You have a headache that is different from the usual headache.  You have nausea or you vomit.  You have a fever. Get help right away if:  Your headache becomes severe quickly.  Your headache gets worse after moderate to intense physical activity.  You have repeated vomiting.  You have a stiff neck.  You have a loss of vision.  You have problems with speech.  You have pain in the eye or ear.  You have muscular weakness or loss of muscle control.  You lose your balance or have trouble walking.  You feel faint or pass out.  You have confusion.  You have a seizure. Summary  A headache is pain or discomfort felt around the head or neck area.  There are many causes and types of headaches. In some cases, the cause may not be found.  Keep a headache journal to  help find out what may trigger your headaches. Watch your condition for any changes. Let your health care provider know about them.  Contact a health care provider if you have a headache that is different from the usual headache, or if your symptoms are not helped by medicine.  Get help right away if your headache becomes severe, you vomit, you have a loss of vision, you lose your balance, or you have a seizure. This information is not intended to replace advice given to you by your health care provider. Make sure you discuss any questions you have with your health care provider. Document Released: 12/27/2004 Document Revised: 07/17/2017 Document Reviewed: 07/17/2017 Elsevier Interactive Patient Education  2019 [employer].

## 2018-03-06 NOTE — Progress Notes (Signed)
Pre visit review using our clinic review tool, if applicable. No additional management support is needed unless otherwise documented below in the visit note. 

## 2018-03-07 ENCOUNTER — Encounter: Payer: Self-pay | Admitting: Emergency Medicine

## 2018-03-07 ENCOUNTER — Other Ambulatory Visit: Payer: Self-pay

## 2018-03-07 ENCOUNTER — Emergency Department: Payer: Medicare Other

## 2018-03-07 DIAGNOSIS — D6959 Other secondary thrombocytopenia: Secondary | ICD-10-CM | POA: Diagnosis present

## 2018-03-07 DIAGNOSIS — Z8349 Family history of other endocrine, nutritional and metabolic diseases: Secondary | ICD-10-CM

## 2018-03-07 DIAGNOSIS — N400 Enlarged prostate without lower urinary tract symptoms: Secondary | ICD-10-CM | POA: Diagnosis present

## 2018-03-07 DIAGNOSIS — M109 Gout, unspecified: Secondary | ICD-10-CM | POA: Diagnosis present

## 2018-03-07 DIAGNOSIS — M549 Dorsalgia, unspecified: Secondary | ICD-10-CM | POA: Diagnosis present

## 2018-03-07 DIAGNOSIS — M1990 Unspecified osteoarthritis, unspecified site: Secondary | ICD-10-CM | POA: Diagnosis present

## 2018-03-07 DIAGNOSIS — K760 Fatty (change of) liver, not elsewhere classified: Secondary | ICD-10-CM | POA: Diagnosis present

## 2018-03-07 DIAGNOSIS — M5136 Other intervertebral disc degeneration, lumbar region: Secondary | ICD-10-CM | POA: Diagnosis present

## 2018-03-07 DIAGNOSIS — I482 Chronic atrial fibrillation, unspecified: Secondary | ICD-10-CM | POA: Diagnosis present

## 2018-03-07 DIAGNOSIS — Z7901 Long term (current) use of anticoagulants: Secondary | ICD-10-CM

## 2018-03-07 DIAGNOSIS — K219 Gastro-esophageal reflux disease without esophagitis: Secondary | ICD-10-CM | POA: Diagnosis present

## 2018-03-07 DIAGNOSIS — I480 Paroxysmal atrial fibrillation: Secondary | ICD-10-CM | POA: Diagnosis present

## 2018-03-07 DIAGNOSIS — F419 Anxiety disorder, unspecified: Secondary | ICD-10-CM | POA: Diagnosis present

## 2018-03-07 DIAGNOSIS — I10 Essential (primary) hypertension: Secondary | ICD-10-CM | POA: Diagnosis present

## 2018-03-07 DIAGNOSIS — I248 Other forms of acute ischemic heart disease: Secondary | ICD-10-CM | POA: Diagnosis present

## 2018-03-07 DIAGNOSIS — I483 Typical atrial flutter: Secondary | ICD-10-CM | POA: Diagnosis present

## 2018-03-07 DIAGNOSIS — Z833 Family history of diabetes mellitus: Secondary | ICD-10-CM

## 2018-03-07 DIAGNOSIS — Z8249 Family history of ischemic heart disease and other diseases of the circulatory system: Secondary | ICD-10-CM

## 2018-03-07 DIAGNOSIS — E785 Hyperlipidemia, unspecified: Secondary | ICD-10-CM | POA: Diagnosis present

## 2018-03-07 DIAGNOSIS — E871 Hypo-osmolality and hyponatremia: Secondary | ICD-10-CM | POA: Diagnosis not present

## 2018-03-07 DIAGNOSIS — E7800 Pure hypercholesterolemia, unspecified: Secondary | ICD-10-CM | POA: Diagnosis present

## 2018-03-07 DIAGNOSIS — Z801 Family history of malignant neoplasm of trachea, bronchus and lung: Secondary | ICD-10-CM

## 2018-03-07 DIAGNOSIS — Z8546 Personal history of malignant neoplasm of prostate: Secondary | ICD-10-CM

## 2018-03-07 DIAGNOSIS — I160 Hypertensive urgency: Secondary | ICD-10-CM | POA: Diagnosis present

## 2018-03-07 DIAGNOSIS — F10239 Alcohol dependence with withdrawal, unspecified: Principal | ICD-10-CM | POA: Diagnosis present

## 2018-03-07 DIAGNOSIS — Z841 Family history of disorders of kidney and ureter: Secondary | ICD-10-CM

## 2018-03-07 DIAGNOSIS — G8929 Other chronic pain: Secondary | ICD-10-CM | POA: Diagnosis present

## 2018-03-07 DIAGNOSIS — Z888 Allergy status to other drugs, medicaments and biological substances status: Secondary | ICD-10-CM

## 2018-03-07 DIAGNOSIS — Z87891 Personal history of nicotine dependence: Secondary | ICD-10-CM

## 2018-03-07 DIAGNOSIS — E876 Hypokalemia: Secondary | ICD-10-CM | POA: Diagnosis not present

## 2018-03-07 DIAGNOSIS — I25118 Atherosclerotic heart disease of native coronary artery with other forms of angina pectoris: Secondary | ICD-10-CM | POA: Diagnosis present

## 2018-03-07 DIAGNOSIS — Z923 Personal history of irradiation: Secondary | ICD-10-CM

## 2018-03-07 DIAGNOSIS — R079 Chest pain, unspecified: Secondary | ICD-10-CM | POA: Diagnosis not present

## 2018-03-07 LAB — CBC WITH DIFFERENTIAL/PLATELET
Abs Immature Granulocytes: 0.02 10*3/uL (ref 0.00–0.07)
BASOS ABS: 0.1 10*3/uL (ref 0.0–0.1)
Basophils Relative: 1 %
Eosinophils Absolute: 0 10*3/uL (ref 0.0–0.5)
Eosinophils Relative: 0 %
HCT: 36.2 % — ABNORMAL LOW (ref 39.0–52.0)
Hemoglobin: 13.4 g/dL (ref 13.0–17.0)
Immature Granulocytes: 0 %
LYMPHS ABS: 1 10*3/uL (ref 0.7–4.0)
Lymphocytes Relative: 13 %
MCH: 39.6 pg — ABNORMAL HIGH (ref 26.0–34.0)
MCHC: 37 g/dL — AB (ref 30.0–36.0)
MCV: 107.1 fL — ABNORMAL HIGH (ref 80.0–100.0)
Monocytes Absolute: 0.8 10*3/uL (ref 0.1–1.0)
Monocytes Relative: 11 %
Neutro Abs: 5.4 10*3/uL (ref 1.7–7.7)
Neutrophils Relative %: 75 %
Platelets: 66 10*3/uL — ABNORMAL LOW (ref 150–400)
RBC: 3.38 MIL/uL — ABNORMAL LOW (ref 4.22–5.81)
RDW: 13.3 % (ref 11.5–15.5)
WBC: 7.2 10*3/uL (ref 4.0–10.5)
nRBC: 0 % (ref 0.0–0.2)

## 2018-03-07 NOTE — ED Triage Notes (Signed)
Pt to triage via w/c with no distress noted; pt reports mid CP radiating into neck/back; denies any accomp symptoms; took NTG hr PTA with some relief; st hx of same with afib; st BP at home 212/80

## 2018-03-08 ENCOUNTER — Inpatient Hospital Stay
Admission: EM | Admit: 2018-03-08 | Discharge: 2018-03-10 | DRG: 897 | Disposition: A | Discharge status: Home / Self Care | Payer: Medicare Other | Attending: Internal Medicine | Admitting: Internal Medicine

## 2018-03-08 ENCOUNTER — Ambulatory Visit
Admission: RE | Admit: 2018-03-08 | Discharge: 2018-03-08 | Disposition: A | Discharge status: Home / Self Care | Payer: 59 | Source: Ambulatory Visit | Attending: Internal Medicine | Admitting: Internal Medicine

## 2018-03-08 ENCOUNTER — Other Ambulatory Visit: Payer: Self-pay

## 2018-03-08 DIAGNOSIS — Z841 Family history of disorders of kidney and ureter: Secondary | ICD-10-CM | POA: Diagnosis not present

## 2018-03-08 DIAGNOSIS — I248 Other forms of acute ischemic heart disease: Secondary | ICD-10-CM | POA: Diagnosis not present

## 2018-03-08 DIAGNOSIS — Z888 Allergy status to other drugs, medicaments and biological substances status: Secondary | ICD-10-CM | POA: Diagnosis not present

## 2018-03-08 DIAGNOSIS — I160 Hypertensive urgency: Secondary | ICD-10-CM | POA: Diagnosis not present

## 2018-03-08 DIAGNOSIS — E785 Hyperlipidemia, unspecified: Secondary | ICD-10-CM | POA: Diagnosis present

## 2018-03-08 DIAGNOSIS — I483 Typical atrial flutter: Secondary | ICD-10-CM | POA: Diagnosis present

## 2018-03-08 DIAGNOSIS — N400 Enlarged prostate without lower urinary tract symptoms: Secondary | ICD-10-CM | POA: Diagnosis present

## 2018-03-08 DIAGNOSIS — Z923 Personal history of irradiation: Secondary | ICD-10-CM | POA: Diagnosis not present

## 2018-03-08 DIAGNOSIS — I25118 Atherosclerotic heart disease of native coronary artery with other forms of angina pectoris: Secondary | ICD-10-CM | POA: Diagnosis not present

## 2018-03-08 DIAGNOSIS — R079 Chest pain, unspecified: Secondary | ICD-10-CM

## 2018-03-08 DIAGNOSIS — M5136 Other intervertebral disc degeneration, lumbar region: Secondary | ICD-10-CM | POA: Diagnosis present

## 2018-03-08 DIAGNOSIS — R519 Headache, unspecified: Secondary | ICD-10-CM

## 2018-03-08 DIAGNOSIS — I482 Chronic atrial fibrillation, unspecified: Secondary | ICD-10-CM | POA: Diagnosis not present

## 2018-03-08 DIAGNOSIS — M1990 Unspecified osteoarthritis, unspecified site: Secondary | ICD-10-CM | POA: Diagnosis present

## 2018-03-08 DIAGNOSIS — Z801 Family history of malignant neoplasm of trachea, bronchus and lung: Secondary | ICD-10-CM | POA: Diagnosis not present

## 2018-03-08 DIAGNOSIS — K219 Gastro-esophageal reflux disease without esophagitis: Secondary | ICD-10-CM | POA: Diagnosis present

## 2018-03-08 DIAGNOSIS — I10 Essential (primary) hypertension: Secondary | ICD-10-CM | POA: Diagnosis not present

## 2018-03-08 DIAGNOSIS — F10231 Alcohol dependence with withdrawal delirium: Secondary | ICD-10-CM | POA: Diagnosis not present

## 2018-03-08 DIAGNOSIS — I361 Nonrheumatic tricuspid (valve) insufficiency: Secondary | ICD-10-CM | POA: Diagnosis not present

## 2018-03-08 DIAGNOSIS — I480 Paroxysmal atrial fibrillation: Secondary | ICD-10-CM | POA: Diagnosis present

## 2018-03-08 DIAGNOSIS — Z87891 Personal history of nicotine dependence: Secondary | ICD-10-CM | POA: Diagnosis not present

## 2018-03-08 DIAGNOSIS — Z833 Family history of diabetes mellitus: Secondary | ICD-10-CM | POA: Diagnosis not present

## 2018-03-08 DIAGNOSIS — E871 Hypo-osmolality and hyponatremia: Secondary | ICD-10-CM | POA: Diagnosis not present

## 2018-03-08 DIAGNOSIS — F419 Anxiety disorder, unspecified: Secondary | ICD-10-CM | POA: Diagnosis present

## 2018-03-08 DIAGNOSIS — M109 Gout, unspecified: Secondary | ICD-10-CM | POA: Diagnosis present

## 2018-03-08 DIAGNOSIS — R51 Headache: Secondary | ICD-10-CM | POA: Diagnosis not present

## 2018-03-08 DIAGNOSIS — Z8546 Personal history of malignant neoplasm of prostate: Secondary | ICD-10-CM | POA: Diagnosis not present

## 2018-03-08 DIAGNOSIS — F10239 Alcohol dependence with withdrawal, unspecified: Principal | ICD-10-CM

## 2018-03-08 DIAGNOSIS — F10939 Alcohol use, unspecified with withdrawal, unspecified: Secondary | ICD-10-CM | POA: Diagnosis present

## 2018-03-08 DIAGNOSIS — Z8249 Family history of ischemic heart disease and other diseases of the circulatory system: Secondary | ICD-10-CM | POA: Diagnosis not present

## 2018-03-08 DIAGNOSIS — Z8349 Family history of other endocrine, nutritional and metabolic diseases: Secondary | ICD-10-CM | POA: Diagnosis not present

## 2018-03-08 DIAGNOSIS — Z7901 Long term (current) use of anticoagulants: Secondary | ICD-10-CM | POA: Diagnosis not present

## 2018-03-08 LAB — TROPONIN I
TROPONIN I: 0.18 ng/mL — AB (ref <0.03)
TROPONIN I: 0.21 ng/mL — AB (ref <0.03)
Troponin I: 0.1 ng/mL (ref <0.03)
Troponin I: 0.11 ng/mL (ref <0.03)
Troponin I: 0.16 ng/mL (ref <0.03)

## 2018-03-08 LAB — COMPREHENSIVE METABOLIC PANEL
ALBUMIN: 3.6 g/dL (ref 3.5–5.0)
ALT: 40 U/L (ref 0–44)
ANION GAP: 13 (ref 5–15)
AST: 97 U/L — ABNORMAL HIGH (ref 15–41)
Alkaline Phosphatase: 92 U/L (ref 38–126)
BILIRUBIN TOTAL: 3.9 mg/dL — AB (ref 0.3–1.2)
BUN: 13 mg/dL (ref 8–23)
CO2: 26 mmol/L (ref 22–32)
Calcium: 9.5 mg/dL (ref 8.9–10.3)
Chloride: 95 mmol/L — ABNORMAL LOW (ref 98–111)
Creatinine, Ser: 0.76 mg/dL (ref 0.61–1.24)
GFR calc Af Amer: >60 mL/min (ref >60)
GFR calc non Af Amer: >60 mL/min (ref >60)
Glucose, Bld: 172 mg/dL — ABNORMAL HIGH (ref 70–99)
Potassium: 3.7 mmol/L (ref 3.5–5.1)
Sodium: 134 mmol/L — ABNORMAL LOW (ref 135–145)
Total Protein: 7.6 g/dL (ref 6.5–8.1)

## 2018-03-08 MED ORDER — LORAZEPAM 2 MG PO TABS
0.0000 mg | ORAL_TABLET | Freq: Four times a day (QID) | ORAL | Status: AC
  Administered 2018-03-08 – 2018-03-09 (×3): 1 mg via ORAL
  Filled 2018-03-08 (×3): qty 1

## 2018-03-08 MED ORDER — HYDRALAZINE HCL 50 MG PO TABS
50.0000 mg | ORAL_TABLET | Freq: Four times a day (QID) | ORAL | Status: DC
  Administered 2018-03-08 – 2018-03-09 (×3): 50 mg via ORAL
  Filled 2018-03-08 (×3): qty 1

## 2018-03-08 MED ORDER — ALBUTEROL SULFATE (2.5 MG/3ML) 0.083% IN NEBU: 2.5000 mg | INHALATION_SOLUTION | Freq: Four times a day (QID) | RESPIRATORY_TRACT | Status: DC | PRN

## 2018-03-08 MED ORDER — ENOXAPARIN SODIUM 40 MG/0.4ML ~~LOC~~ SOLN
40.0000 mg | SUBCUTANEOUS | Status: DC
  Filled 2018-03-08: qty 0.4

## 2018-03-08 MED ORDER — LORAZEPAM 2 MG/ML IJ SOLN: 0.0000 mg | Freq: Two times a day (BID) | INTRAMUSCULAR | Status: DC

## 2018-03-08 MED ORDER — TRAMADOL HCL 50 MG PO TABS
50.0000 mg | ORAL_TABLET | Freq: Three times a day (TID) | ORAL | Status: DC | PRN
  Administered 2018-03-08 – 2018-03-10 (×2): 50 mg via ORAL
  Filled 2018-03-08 (×2): qty 1

## 2018-03-08 MED ORDER — NITROGLYCERIN 2 % TD OINT
1.0000 [in_us] | TOPICAL_OINTMENT | Freq: Once | TRANSDERMAL | Status: AC
  Administered 2018-03-08: 1 [in_us] via TOPICAL

## 2018-03-08 MED ORDER — ONDANSETRON HCL 4 MG PO TABS
4.0000 mg | ORAL_TABLET | Freq: Four times a day (QID) | ORAL | Status: DC | PRN
  Filled 2018-03-08: qty 1

## 2018-03-08 MED ORDER — BUSPIRONE HCL 10 MG PO TABS
10.0000 mg | ORAL_TABLET | Freq: Three times a day (TID) | ORAL | Status: DC
  Administered 2018-03-08 – 2018-03-10 (×6): 10 mg via ORAL
  Filled 2018-03-08: qty 2
  Filled 2018-03-08 (×9): qty 1

## 2018-03-08 MED ORDER — THIAMINE HCL 100 MG/ML IJ SOLN: 100.0000 mg | Freq: Every day | INTRAMUSCULAR | Status: DC

## 2018-03-08 MED ORDER — HYDRALAZINE HCL 20 MG/ML IJ SOLN
10.0000 mg | Freq: Four times a day (QID) | INTRAMUSCULAR | Status: DC | PRN
  Administered 2018-03-08 (×2): 10 mg via INTRAVENOUS
  Filled 2018-03-08: qty 1
  Filled 2018-03-08: qty 0.5
  Filled 2018-03-08: qty 1

## 2018-03-08 MED ORDER — APIXABAN 5 MG PO TABS
5.0000 mg | ORAL_TABLET | Freq: Two times a day (BID) | ORAL | Status: DC
  Administered 2018-03-08 – 2018-03-10 (×6): 5 mg via ORAL
  Filled 2018-03-08 (×7): qty 1

## 2018-03-08 MED ORDER — FOLIC ACID 1 MG PO TABS
1.0000 mg | ORAL_TABLET | Freq: Every day | ORAL | Status: DC
  Administered 2018-03-08 – 2018-03-10 (×3): 1 mg via ORAL
  Filled 2018-03-08 (×3): qty 1

## 2018-03-08 MED ORDER — HYDROCHLOROTHIAZIDE 25 MG PO TABS
12.5000 mg | ORAL_TABLET | Freq: Every day | ORAL | Status: DC
  Administered 2018-03-08 – 2018-03-10 (×3): 12.5 mg via ORAL
  Filled 2018-03-08 (×3): qty 1

## 2018-03-08 MED ORDER — ADULT MULTIVITAMIN W/MINERALS CH
1.0000 | ORAL_TABLET | Freq: Every day | ORAL | Status: DC
  Administered 2018-03-08 – 2018-03-10 (×3): 1 via ORAL
  Filled 2018-03-08 (×3): qty 1

## 2018-03-08 MED ORDER — MAGNESIUM OXIDE 400 (241.3 MG) MG PO TABS
400.0000 mg | ORAL_TABLET | Freq: Two times a day (BID) | ORAL | Status: DC
  Administered 2018-03-08 – 2018-03-10 (×5): 400 mg via ORAL
  Filled 2018-03-08 (×6): qty 1

## 2018-03-08 MED ORDER — LORAZEPAM 2 MG PO TABS: 0.0000 mg | ORAL_TABLET | Freq: Two times a day (BID) | ORAL | Status: DC

## 2018-03-08 MED ORDER — SODIUM CHLORIDE 0.9 % IV SOLN
Freq: Once | INTRAVENOUS | Status: AC
  Administered 2018-03-08: 07:00:00 via INTRAVENOUS

## 2018-03-08 MED ORDER — NITROGLYCERIN 0.4 MG SL SUBL: 0.4000 mg | SUBLINGUAL_TABLET | SUBLINGUAL | Status: DC | PRN

## 2018-03-08 MED ORDER — BISACODYL 10 MG RE SUPP
10.0000 mg | Freq: Every day | RECTAL | Status: DC | PRN
  Filled 2018-03-08: qty 1

## 2018-03-08 MED ORDER — ONDANSETRON HCL 4 MG/2ML IJ SOLN: 4.0000 mg | Freq: Four times a day (QID) | INTRAMUSCULAR | Status: DC | PRN

## 2018-03-08 MED ORDER — ACETAMINOPHEN 325 MG PO TABS
650.0000 mg | ORAL_TABLET | Freq: Four times a day (QID) | ORAL | Status: DC | PRN
  Filled 2018-03-08: qty 2

## 2018-03-08 MED ORDER — ACETAMINOPHEN 650 MG RE SUPP
650.0000 mg | Freq: Four times a day (QID) | RECTAL | Status: DC | PRN
  Filled 2018-03-08: qty 1

## 2018-03-08 MED ORDER — B COMPLEX-C PO TABS
1.0000 | ORAL_TABLET | Freq: Every day | ORAL | Status: DC
  Administered 2018-03-08 – 2018-03-10 (×3): 1 via ORAL
  Filled 2018-03-08 (×4): qty 1

## 2018-03-08 MED ORDER — ASPIRIN 81 MG PO CHEW
81.0000 mg | CHEWABLE_TABLET | Freq: Every day | ORAL | Status: DC
  Administered 2018-03-08 – 2018-03-10 (×3): 81 mg via ORAL
  Filled 2018-03-08 (×3): qty 1

## 2018-03-08 MED ORDER — NEBIVOLOL HCL 10 MG PO TABS
10.0000 mg | ORAL_TABLET | Freq: Every day | ORAL | Status: DC
  Administered 2018-03-08 – 2018-03-10 (×3): 10 mg via ORAL
  Filled 2018-03-08 (×3): qty 1

## 2018-03-08 MED ORDER — LORAZEPAM 2 MG/ML IJ SOLN
0.0000 mg | Freq: Four times a day (QID) | INTRAMUSCULAR | Status: AC
  Administered 2018-03-08 (×4): 1 mg via INTRAVENOUS
  Administered 2018-03-09: 2 mg via INTRAVENOUS
  Filled 2018-03-08 (×5): qty 1

## 2018-03-08 MED ORDER — LORAZEPAM 2 MG/ML IJ SOLN
0.5000 mg | Freq: Once | INTRAMUSCULAR | Status: AC
  Administered 2018-03-08: 0.5 mg via INTRAVENOUS
  Filled 2018-03-08: qty 1

## 2018-03-08 MED ORDER — VITAMIN B-1 100 MG PO TABS
100.0000 mg | ORAL_TABLET | Freq: Every day | ORAL | Status: DC
  Administered 2018-03-08 – 2018-03-10 (×3): 100 mg via ORAL
  Filled 2018-03-08 (×3): qty 1

## 2018-03-08 MED ORDER — DOXYCYCLINE HYCLATE 100 MG PO TABS
100.0000 mg | ORAL_TABLET | Freq: Two times a day (BID) | ORAL | Status: DC
  Administered 2018-03-08 – 2018-03-10 (×6): 100 mg via ORAL
  Filled 2018-03-08 (×7): qty 1

## 2018-03-08 MED ORDER — DOXAZOSIN MESYLATE 8 MG PO TABS
8.0000 mg | ORAL_TABLET | Freq: Two times a day (BID) | ORAL | Status: DC
  Administered 2018-03-08 – 2018-03-10 (×5): 8 mg via ORAL
  Filled 2018-03-08 (×9): qty 1

## 2018-03-08 NOTE — ED Notes (Signed)
ED TO INPATIENT HANDOFF REPORT  Name/Age/Gender Peter [name] 63 y.o. male  Code Status    Code Status Orders  (From admission, onward)         Start     Ordered   03/08/18 0337  Full code  Continuous     03/08/18 0336        Code Status History    Date Active Date Inactive Code Status Order ID Comments User Context   03/08/2018 0108 03/08/2018 0336 Full Code [id number]  Gregor Hams, MD ED   01/19/2017 2249 01/20/2017 1755 Full Code [id number]  Lance Coon, MD Inpatient   05/16/2016 2151 05/17/2016 1654 Full Code [id number]  Henreitta Leber, MD Inpatient      Home/SNF/Other Home  Chief Complaint elevated BP  Level of Care/Admitting Diagnosis ED Disposition    ED Disposition Condition [facility] Area: [place] [100120]  Level of Care: Telemetry [5]  Diagnosis: Alcohol withdrawal ([place]) [291.81.ICD-9-CM]  Admitting Physician: Odessa Fleming  Attending Physician: Odessa Fleming  Estimated length of stay: past midnight tomorrow  Certification:: I certify this patient will need inpatient services for at least 2 midnights  PT Class (Do Not Modify): Inpatient [101]  PT Acc Code (Do Not Modify): Private [1]       Medical History Past Medical History:  Diagnosis Date  . A-fib ([place])   . Abnormality of gait 09/03/2012  . Alcohol abuse   . Anxiety   . Arthritis   . Arthritis   . Esophageal stricture   . GERD (gastroesophageal reflux disease)   . Gout   . Hyperlipidemia   . Hypertension   . Internal hemorrhoids   . Lesion of ulnar nerve 09/03/2012   Right ulnar neuropathy  . Murmur, cardiac   . PAF (paroxysmal atrial fibrillation) ([place])   . Palpitations   . Prostate cancer ([place]) 2016   treated with radioactive seed implant  . Seizures ([place])    pt states he had seizure 12/14  . Tubular adenoma of colon 03/2008    Allergies Allergies  Allergen Reactions  . Hydrocodone Bitartrate Er Other (See Comments)     Makes him "feel funny"  . Clonazepam     Syncope  . Crestor [Rosuvastatin] Other (See Comments)    Bloating, swelling of legs, fatty liver  . Dust Mite Extract   . Gabapentin     Leg twitching at night, ineffective   . Lipitor [Atorvastatin] Other (See Comments)    Myalgias  . Lisinopril Swelling  . Topamax [Topiramate]     Cognitive clouding    IV Location/Drains/Wounds Patient Lines/Drains/Airways Status   Active Line/Drains/Airways    Name:   Placement date:   Placement time:   Site:   Days:   Peripheral IV 03/08/18 Right Wrist   03/08/18    0054    Wrist   less than 1          Labs/Imaging Results for orders placed or performed during the hospital encounter of 03/08/18 (from the past 48 hour(s))  CBC with Differential     Status: Abnormal   Collection Time: 03/07/18 10:52 PM  Result Value Ref Range   WBC 7.2 4.0 - 10.5 K/uL   RBC 3.38 (L) 4.22 - 5.81 MIL/uL   Hemoglobin 13.4 13.0 - 17.0 g/dL   HCT 36.2 (L) 39.0 - 52.0 %   MCV 107.1 (H) 80.0 - 100.0 fL   MCH 39.6 (H) 26.0 -  34.0 pg   MCHC 37.0 (H) 30.0 - 36.0 g/dL   RDW 13.3 11.5 - 15.5 %   Platelets 66 (L) 150 - 400 K/uL    Comment: SPECIMEN CHECKED FOR CLOTS Immature Platelet Fraction may be clinically indicated, consider ordering this additional test [id number]    nRBC 0.0 0.0 - 0.2 %   Neutrophils Relative % 75 %   Neutro Abs 5.4 1.7 - 7.7 K/uL   Lymphocytes Relative 13 %   Lymphs Abs 1.0 0.7 - 4.0 K/uL   Monocytes Relative 11 %   Monocytes Absolute 0.8 0.1 - 1.0 K/uL   Eosinophils Relative 0 %   Eosinophils Absolute 0.0 0.0 - 0.5 K/uL   Basophils Relative 1 %   Basophils Absolute 0.1 0.0 - 0.1 K/uL   Immature Granulocytes 0 %   Abs Immature Granulocytes 0.02 0.00 - 0.07 K/uL    Comment: Performed at [facility], [address] [place], [place] [address]  Comprehensive metabolic panel     Status: Abnormal   Collection Time: 03/07/18 10:52 PM  Result Value Ref Range   Sodium 134 (L) 135 -  145 mmol/L   Potassium 3.7 3.5 - 5.1 mmol/L   Chloride 95 (L) 98 - 111 mmol/L   CO2 26 22 - 32 mmol/L   Glucose, Bld 172 (H) 70 - 99 mg/dL   BUN 13 8 - 23 mg/dL   Creatinine, Ser 0.76 0.61 - 1.24 mg/dL   Calcium 9.5 8.9 - 10.3 mg/dL   Total Protein 7.6 6.5 - 8.1 g/dL   Albumin 3.6 3.5 - 5.0 g/dL   AST 97 (H) 15 - 41 U/L   ALT 40 0 - 44 U/L   Alkaline Phosphatase 92 38 - 126 U/L   Total Bilirubin 3.9 (H) 0.3 - 1.2 mg/dL   GFR calc non Af Amer >60 >60 mL/min   GFR calc Af Amer >60 >60 mL/min   Anion gap 13 5 - 15    Comment: Performed at [facility], [address] [place], [place] [address]  Troponin I - ONCE - STAT     Status: Abnormal   Collection Time: 03/07/18 10:52 PM  Result Value Ref Range   Troponin I 0.10 (HH) <0.03 ng/mL    Comment: CRITICAL RESULT CALLED TO, READ BACK BY AND VERIFIED WITH JENNA ELLINGTON AT 0003 03/08/2018 SMA Performed at [facility], [address] [place], [place] [address]   Troponin I - ONCE - STAT     Status: Abnormal   Collection Time: 03/08/18  2:19 AM  Result Value Ref Range   Troponin I 0.11 (HH) <0.03 ng/mL    Comment: CRITICAL VALUE NOTED. VALUE IS CONSISTENT WITH PREVIOUSLY REPORTED/CALLED VALUE SMA Performed at [facility], [address] [place], [place] [address]   Troponin I - Now Then Q6H     Status: Abnormal   Collection Time: 03/08/18  9:57 AM  Result Value Ref Range   Troponin I 0.18 (HH) <0.03 ng/mL    Comment: CRITICAL VALUE NOTED. VALUE IS CONSISTENT WITH PREVIOUSLY REPORTED/CALLED VALUE FMW Performed at [facility], [address] [place], [place] [address]    Dg Chest 2 View  Result Date: 03/07/2018 CLINICAL DATA:  Mid chest pain radiating to the neck. EXAM: CHEST - 2 VIEW COMPARISON:  10/31/2017 FINDINGS: Heart size and pulmonary vascularity are normal. Mild hyperinflation suggesting emphysema. Peribronchial thickening consistent with chronic bronchitis. No airspace disease  or consolidation in the lungs. No  blunting of costophrenic angles. No pneumothorax. Mediastinal contours appear intact. Calcification of the aorta. Old fracture deformity of the distal right clavicle. Degenerative changes in the spine. IMPRESSION: Emphysematous and chronic bronchitic changes in the lungs. No evidence of active pulmonary disease. Electronically Signed   By: Lucienne Capers M.D.   On: 03/07/2018 23:18   Mr Brain Wo Contrast  Result Date: 03/08/2018 CLINICAL DATA:  Periorbital headache for 10 days. Hypertensive. History of hypertension, hyperlipidemia, alcohol abuse, seizures. EXAM: MRI HEAD WITHOUT CONTRAST TECHNIQUE: Multiplanar, multiecho pulse sequences of the brain and surrounding structures were obtained without intravenous contrast. COMPARISON:  CT HEAD January 19, 2017 FINDINGS: INTRACRANIAL CONTENTS: No reduced diffusion to suggest acute ischemia. No susceptibility artifact to suggest hemorrhage. Moderate parenchymal brain volume loss for age. No hydrocephalus. Patchy supratentorial white matter FLAIR T2 hyperintensities. No suspicious parenchymal signal, masses, mass effect. No abnormal extra-axial fluid collections. No extra-axial masses. VASCULAR: Normal major intracranial vascular flow voids present at skull base. SKULL AND UPPER CERVICAL SPINE: No abnormal sell[place] expansion. No suspicious calvarial bone marrow signal. Craniocervical junction maintained. SINUSES/ORBITS: Trace RIGHT mastoid effusion. Minimal paranasal sinus mucosal thickening.The included ocular globes and orbital contents are non-suspicious. Status post bilateral ocular lens implants. OTHER: None. IMPRESSION: 1. No acute intracranial process. 2. Moderate parenchymal brain volume loss, advanced for age. 3. Mild chronic small vessel ischemic changes. Electronically Signed   By: Elon Alas M.D.   On: 03/08/2018 06:28    Pending Labs Unresulted Labs (From admission, onward)    Start     Ordered   03/08/18 1537   Troponin I - Now Then Q6H  Now then every 6 hours,   STAT     03/08/18 1043          Vitals/Pain Today's Vitals   03/08/18 1217 03/08/18 1238 03/08/18 1300 03/08/18 1312  BP: (!) 207/73 (!) 193/70 (!) 190/68 (!) 179/60  Pulse: (!) 59 (!) 59 (!) 56 62  Resp: 14 (!) 21 14 16   Temp:      TempSrc:      SpO2: 96% 96% 92% 93%  Weight:      Height:      PainSc:        Isolation Precautions No active isolations  Medications Medications  LORazepam (ATIVAN) injection 0-4 mg (1 mg Intravenous Given 03/08/18 1234)    Or  LORazepam (ATIVAN) tablet 0-4 mg ( Oral See Alternative 03/08/18 1234)  LORazepam (ATIVAN) injection 0-4 mg (has no administration in time range)    Or  LORazepam (ATIVAN) tablet 0-4 mg (has no administration in time range)  thiamine (VITAMIN B-1) tablet 100 mg (100 mg Oral Given 03/08/18 0953)    Or  thiamine (B-1) injection 100 mg ( Intravenous See Alternative 03/08/18 0953)  traMADol (ULTRAM) tablet 50 mg (50 mg Oral Given 03/08/18 0240)  doxycycline (VIBRA-TABS) tablet 100 mg (100 mg Oral Given 03/08/18 0953)  doxazosin (CARDURA) tablet 8 mg (0 mg Oral Hold 03/08/18 1147)  hydrochlorothiazide (HYDRODIURIL) tablet 12.5 mg (12.5 mg Oral Given 03/08/18 0954)  nebivolol (BYSTOLIC) tablet 10 mg (10 mg Oral Given 03/08/18 1145)  nitroGLYCERIN (NITROSTAT) SL tablet 0.4 mg (has no administration in time range)  busPIRone (BUSPAR) tablet 10 mg (10 mg Oral Given 03/08/18 0954)  apixaban (ELIQUIS) tablet 5 mg (5 mg Oral Given 7/00/17 4944)  folic acid (FOLVITE) tablet 1 mg (1 mg Oral Given 03/08/18 0954)  B-complex with vitamin C tablet 1 tablet (1 tablet Oral Given 03/08/18 1146)  magnesium  oxide (MAG-OX) tablet 400 mg (400 mg Oral Given 03/08/18 0952)  multivitamin with minerals tablet 1 tablet (1 tablet Oral Given 03/08/18 0952)  albuterol (PROVENTIL) (2.5 MG/3ML) 0.083% nebulizer solution 2.5 mg (has no administration in time range)  acetaminophen (TYLENOL) tablet 650 mg (has no  administration in time range)    Or  acetaminophen (TYLENOL) suppository 650 mg (has no administration in time range)  bisacodyl (DULCOLAX) suppository 10 mg (has no administration in time range)  ondansetron (ZOFRAN) tablet 4 mg (has no administration in time range)    Or  ondansetron (ZOFRAN) injection 4 mg (has no administration in time range)  aspirin chewable tablet 81 mg (81 mg Oral Given 03/08/18 0953)  hydrALAZINE (APRESOLINE) injection 10 mg (10 mg Intravenous Given 03/08/18 1229)  LORazepam (ATIVAN) injection 0.5 mg (0.5 mg Intravenous Given 03/08/18 0056)  0.9 %  sodium chloride infusion ( Intravenous Stopped 03/08/18 1126)  nitroGLYCERIN (NITROGLYN) 2 % ointment 1 inch (1 inch Topical Given 03/08/18 0212)    Mobility walks

## 2018-03-08 NOTE — Consult Note (Signed)
Cardiology Consultation:   Patient ID: SERJIO [name] MRN: [id number]; DOB: 1955/05/02  Admit date: 03/08/2018 Date of Consult: 03/08/2018  Primary Care Provider: [name], MD Primary Cardiologist:Dr. Arvid Right Primary Electrophysiologist:  None    Patient Profile:   Peter [name] is a 63 y.o. male with a hx of CAD medically managed, atrial flutter s/p DCCV 11/02/2015 on Eliquis, daily alcohol abuse, prostate cancer, hypertension, tobacco abuse, hyperlipidemia with statin intolerance on Praluent, GI bleed 05/2016, chronic back pain, GERD, gout, seizure disorder, anxiety who is being seen today for the evaluation of chest pain following presentation to [facility] ED for alcohol withdrawal at the request of Dr. Stark Jock.  History of Present Illness:   [name] is a 63 year old male with PMH as above.  Recently followed by vascular surgery for carotid disease.  Stress test 08/07/2014 without ischemia. CT scan performed with CAC score 2778 (04/2015). Images were significant for diffuse calcification/calcified atherosclerosis in the LAD, diagonal branch, ostial and proximal RCA.  No significant disease is noted in the left circumflex. Patient denied any anginal symptoms at that time.  Cardioversion 11/02/2015 for atrial flutter with ongoing EtOH abuse.  Echo January 2019 with EF normal and greater than 65%, G1DD, biatrial enlargement.    Presented to [facility] ED 08/26/2017 with complaint of chest tightness found to be d/t elevated blood pressure.  Etiology of chest pain noted as likely secondary to GI etiology. At rest, CP alleviated by nitro, which was administered for possible esophageal spasm.  Patient was recommended at follow-up in the office to proceed with esophageal dilation with Dr. [name].    He has known daily alcohol use, estimated at a 12 pack daily. Most recently reported 4 cigarettes daily. No known regular exercise program. Per EMR, statin intolerance d/t "being debilitated when on  Crestor and Lipitor, even at different doses." H/o bilateral leg edema with calcium channel blockers.  He has not tolerated isosorbide in the past.  He has reported leg swelling with lisinopril.    He presented to [facility] ED 2/27 for alcohol withdrawal and CIWA protocol activated with last use EtoOH the evening to AM of 2/25-2/26. He stated he decided to quit "cold [place];" however, he awoke 2/26 with chest tightness and associated anxiety after his last drink. He took his BP and noted SBP over 200 with several measurements (up to 3). He took 1 nitro with temporary alleviation of the pain for ~ 1 hour. He also took 4 of his ACE inhibitor with decrease in his BP. Given his elevated BP and CP, he presented to [facility] ED. In the ED, he was noted to have mildly elevated troponin in the setting of alcohol withdrawal and rebound HTN. CXR without acute changes. NSR on EKG. Troponin minimally elevated at 0.10 and has since trended to 0.18. Started on PRN IV hydralazine and ASA administered. CIWA protocol. Receiving cardura, hydralazine, HCTZ, bystolic, and nitro as well as ativan with labile SBP at the time of consultation.  Past Medical History:  Diagnosis Date  . A-fib ([place])   . Abnormality of gait 09/03/2012  . Alcohol abuse   . Anxiety   . Arthritis   . Arthritis   . Esophageal stricture   . GERD (gastroesophageal reflux disease)   . Gout   . Hyperlipidemia   . Hypertension   . Internal hemorrhoids   . Lesion of ulnar nerve 09/03/2012   Right ulnar neuropathy  . Murmur, cardiac   . PAF (paroxysmal atrial fibrillation) ([place])   .  Palpitations   . Prostate cancer ([place]) 2016   treated with radioactive seed implant  . Seizures ([place])    pt states he had seizure 12/14  . Tubular adenoma of colon 03/2008    Past Surgical History:  Procedure Laterality Date  . CATARACT EXTRACTION W/PHACO Right 07/03/2014   Procedure: CATARACT EXTRACTION PHACO AND INTRAOCULAR LENS PLACEMENT (IOC);  Surgeon: Lyla Glassing,  MD;  Location: [facility] ORS;  Service: Ophthalmology;  Laterality: Right;  lot # [id number] H [place]: 00:32.3 AP% 9.1 CDE: 2.92  . CIRCUMCISION    . COLONOSCOPY    . ELBOW SURGERY    . ELECTROPHYSIOLOGIC STUDY N/A 11/02/2015   Procedure: CARDIOVERSION;  Surgeon: Minna Merritts, MD;  Location: [facility] ORS;  Service: Cardiovascular;  Laterality: N/A;  . ESOPHAGOGASTRODUODENOSCOPY (EGD) WITH PROPOFOL N/A 05/17/2016   Procedure: ESOPHAGOGASTRODUODENOSCOPY (EGD) WITH PROPOFOL;  Surgeon: Lucilla Lame, MD;  Location: [facility] ENDOSCOPY;  Service: Endoscopy;  Laterality: N/A;  . EYE SURGERY    . HAND SURGERY Left   . SHOULDER ARTHROSCOPY Left   . SHOULDER SURGERY       Home Medications:  Prior to Admission medications   Medication Sig Start Date End Date Taking? Authorizing Provider  Alirocumab (PRALUENT) 150 MG/ML SOAJ Inject 1 pen into the skin every 14 (fourteen) days. 01/04/18  Yes Gollan, Kathlene November, MD  apixaban (ELIQUIS) 5 MG TABS tablet Take 5 mg by mouth 2 (two) times daily.   Yes [provider]  B Complex-C (B-COMPLEX WITH VITAMIN C) tablet Take 1 tablet by mouth daily.   Yes [provider]  busPIRone (BUSPAR) 10 MG tablet TAKE ONE TABLET BY MOUTH THREE TIMES A DAY 12/18/17  Yes Kathrynn Ducking, MD  doxazosin (CARDURA) 8 MG tablet Take 1 tablet (8 mg total) by mouth 2 (two) times daily. 01/30/18  Yes Gollan, Kathlene November, MD  doxycycline (VIBRA-TABS) 100 MG tablet Take 1 tablet (100 mg total) by mouth 2 (two) times daily. X 10 days with food 03/06/18  Yes McLean-Scocuzza, Nino Glow, MD  FOLIC ACID PO Take by mouth daily.   Yes [provider]  hydrochlorothiazide (HYDRODIURIL) 12.5 MG tablet Take 1 tablet (12.5 mg total) by mouth daily. In the am for blood pressure 01/23/18  Yes [name], MD  Melatonin 10 MG TABS Take 1 tablet by mouth at bedtime.   Yes [provider]  milk thistle 175 MG tablet Take 175 mg by mouth daily.   Yes [provider]  Multiple  Vitamin (MULTIVITAMIN) tablet Take 1 tablet by mouth daily. Centrum Plus -Take 1 daily   Yes [provider]  nebivolol (BYSTOLIC) 10 MG tablet Take 10 mg by mouth daily.   Yes [provider]  nitroGLYCERIN (NITROSTAT) 0.4 MG SL tablet Place 1 tablet (0.4 mg total) under the tongue every 5 (five) minutes as needed for chest pain. 09/15/17  Yes Gollan, Kathlene November, MD  traMADol (ULTRAM) 50 MG tablet TAKE ONE TABLET BY MOUTH EVERY 12 HOURS AS NEEDED FOR MODERATE PAIN 03/08/18  Yes [name], MD  triamcinolone cream (KENALOG) 0.1 % Apply 1 application topically daily as needed.  12/24/14  Yes [provider]  VOLTAREN 1 % GEL Apply 2 g topically as needed (knees).  04/02/13  Yes [provider]  albuterol (PROVENTIL HFA;VENTOLIN HFA) 108 (90 Base) MCG/ACT inhaler Inhale 2 puffs into the lungs every 6 (six) hours as needed for wheezing or shortness of breath. 12/27/17   [name], MD  Inpatient Medications: Scheduled Meds: . apixaban  5 mg Oral BID  . aspirin  81 mg Oral Daily  . B-complex with vitamin C  1 tablet Oral Daily  . busPIRone  10 mg Oral TID  . doxazosin  8 mg Oral BID  . doxycycline  100 mg Oral BID  . enoxaparin (LOVENOX) injection  40 mg Subcutaneous Q24H  . folic acid  1 mg Oral Daily  . hydrochlorothiazide  12.5 mg Oral Daily  . LORazepam  0-4 mg Intravenous Q6H   Or  . LORazepam  0-4 mg Oral Q6H  . [START ON 03/10/2018] LORazepam  0-4 mg Intravenous Q12H   Or  . [START ON 03/10/2018] LORazepam  0-4 mg Oral Q12H  . magnesium oxide  400 mg Oral BID AC & HS  . methylPREDNISolone acetate  80 mg Other Once  . multivitamin with minerals  1 tablet Oral Daily  . nebivolol  10 mg Oral Daily  . thiamine  100 mg Oral Daily   Or  . thiamine  100 mg Intravenous Daily   Continuous Infusions:  PRN Meds: acetaminophen **OR** acetaminophen, albuterol, bisacodyl, hydrALAZINE, nitroGLYCERIN, ondansetron **OR** ondansetron (ZOFRAN) IV,  traMADol  Allergies:    Allergies  Allergen Reactions  . Hydrocodone Bitartrate Er Other (See Comments)    Makes him "feel funny"  . Clonazepam     Syncope  . Crestor [Rosuvastatin] Other (See Comments)    Bloating, swelling of legs, fatty liver  . Dust Mite Extract   . Gabapentin     Leg twitching at night, ineffective   . Lipitor [Atorvastatin] Other (See Comments)    Myalgias  . Lisinopril Swelling  . Topamax [Topiramate]     Cognitive clouding    Social History:   Social History   Socioeconomic History  . Marital status: Married    Spouse name: Not on file  . Number of children: 0  . Years of education: hs  . Highest education level: Not on file  Occupational History  . Occupation: Retired    Fish farm manager: Economist  . Occupation: UTILITIES OPER.    Employer: [employer]  Social Needs  . Financial resource strain: Not on file  . Food insecurity:    Worry: Not on file    Inability: Not on file  . Transportation needs:    Medical: Not on file    Non-medical: Not on file  Tobacco Use  . Smoking status: Former Smoker    Packs/day: 0.10    Years: 24.00    Pack years: 2.40    Types: Cigarettes  . Smokeless tobacco: Never Used  . Tobacco comment: down to 4-5  ciggs a day.  Substance and Sexual Activity  . Alcohol use: Yes    Alcohol/week: 2.0 standard drinks    Types: 2 Cans of beer per week    Frequency: Never  . Drug use: No    Types: Marijuana    Comment: 1970's occ.   Marland Kitchen Sexual activity: Not on file  Lifestyle  . Physical activity:    Days per week: Not on file    Minutes per session: Not on file  . Stress: Not on file  Relationships  . Social connections:    Talks on phone: Not on file    Gets together: Not on file    Attends religious service: Not on file    Active member of club or organization: Not on file    Attends meetings of clubs or organizations: Not  on file    Relationship status: Not on file  . Intimate partner violence:    Fear of  current or ex partner: Not on file    Emotionally abused: Not on file    Physically abused: Not on file    Forced sexual activity: Not on file  Other Topics Concern  . Not on file  Social History Narrative   Lives with wife   Caffeine use: Drinks one cup coffee per morning   No soda or tea    Family History:    Family History  Problem Relation Age of Onset  . Diabetes Father   . Kidney disease Father   . Lung cancer Father        smoker  . Hyperlipidemia Father   . Hypertension Father   . Lung cancer Mother        smoke  . Hypertension Mother   . Colon cancer Neg Hx   . Pancreatic cancer Neg Hx   . Stomach cancer Neg Hx      ROS:  Please see the history of present illness.   Review of Systems  Constitutional: Negative for fever and weight loss.  Respiratory: Negative for hemoptysis and sputum production.   Cardiovascular: Positive for chest pain. Negative for palpitations, orthopnea, claudication, leg swelling and PND.  Gastrointestinal: Negative for abdominal pain and vomiting.  Genitourinary: Negative for hematuria.  Musculoskeletal: Positive for back pain and joint pain.       S/p shoulder surgery, chronic LBP  Neurological: Positive for tremors. Negative for speech change, focal weakness and loss of consciousness.       Chronic tremor  Psychiatric/Behavioral:       Anxiety  All other systems reviewed and are negative.   All other ROS reviewed and negative.     Physical Exam/Data:   Vitals:   03/08/18 0506 03/08/18 0630 03/08/18 0638 03/08/18 0741  BP:  (!) 184/75 (!) 184/75 (!) 191/74  Pulse: (!) 56  60 65  Resp:  17 18   Temp:      TempSrc:      SpO2:  97% 97%   Weight:      Height:       No intake or output data in the 24 hours ending 03/08/18 0809 Filed Weights   03/07/18 2249  Weight: 118.8 kg   Body mass index is 34.57 kg/m.  General:  Well nourished, well developed, in no acute distress, joined by wife HEENT: normal Neck: no  JVD Vascular: Radial pulses 2+ bilaterally   Cardiac:  Tachycardic rate, regular rhythm S1, S2; no murmur  Lungs:  Slight wheezes bilaterally, no rhonchi or rales  Abd: soft, nontender Ext: no bilateral lower extremity edema Musculoskeletal:  No deformities Skin: warm and dry  Neuro:  no focal abnormalities noted Psych:  Normal affect   EKG: Refer to HPI- NSR Telemetry:  Telemetry was personally reviewed and demonstrates:  NSR  CV Studies:   Relevant CV Studies: 01/20/2017 Study Conclusions - Left ventricle: The cavity size was normal. There was mild   concentric hypertrophy. Systolic function was vigorous. The   estimated ejection fraction was in the range of 65% to 70%. Wall   motion was normal; there were no regional wall motion   abnormalities. Doppler parameters are consistent with abnormal   left ventricular relaxation (grade 1 diastolic dysfunction). - Left atrium: The atrium was mildly dilated. - Right atrium: The atrium was mildly dilated.  Laboratory Data:  Chemistry Recent Labs  Lab 03/07/18 2252  NA 134*  K 3.7  CL 95*  CO2 26  GLUCOSE 172*  BUN 13  CREATININE 0.76  CALCIUM 9.5  GFRNONAA >60  GFRAA >60  ANIONGAP 13    Recent Labs  Lab 03/07/18 2252  PROT 7.6  ALBUMIN 3.6  AST 97*  ALT 40  ALKPHOS 92  BILITOT 3.9*   Hematology Recent Labs  Lab 03/07/18 2252  WBC 7.2  RBC 3.38*  HGB 13.4  HCT 36.2*  MCV 107.1*  MCH 39.6*  MCHC 37.0*  RDW 13.3  PLT 66*   Cardiac Enzymes Recent Labs  Lab 03/07/18 2252 03/08/18 0219  TROPONINI 0.10* 0.11*   No results for input(s): TROPIPOC in the last 168 hours.  BNPNo results for input(s): BNP, PROBNP in the last 168 hours.  DDimer No results for input(s): DDIMER in the last 168 hours.  Radiology/Studies:  Dg Chest 2 View  Result Date: 03/07/2018 CLINICAL DATA:  Mid chest pain radiating to the neck. EXAM: CHEST - 2 VIEW COMPARISON:  10/31/2017 FINDINGS: Heart size and pulmonary vascularity are  normal. Mild hyperinflation suggesting emphysema. Peribronchial thickening consistent with chronic bronchitis. No airspace disease or consolidation in the lungs. No blunting of costophrenic angles. No pneumothorax. Mediastinal contours appear intact. Calcification of the aorta. Old fracture deformity of the distal right clavicle. Degenerative changes in the spine. IMPRESSION: Emphysematous and chronic bronchitic changes in the lungs. No evidence of active pulmonary disease. Electronically Signed   By: Lucienne Capers M.D.   On: 03/07/2018 23:18   Mr Brain Wo Contrast  Result Date: 03/08/2018 CLINICAL DATA:  Periorbital headache for 10 days. Hypertensive. History of hypertension, hyperlipidemia, alcohol abuse, seizures. EXAM: MRI HEAD WITHOUT CONTRAST TECHNIQUE: Multiplanar, multiecho pulse sequences of the brain and surrounding structures were obtained without intravenous contrast. COMPARISON:  CT HEAD January 19, 2017 FINDINGS: INTRACRANIAL CONTENTS: No reduced diffusion to suggest acute ischemia. No susceptibility artifact to suggest hemorrhage. Moderate parenchymal brain volume loss for age. No hydrocephalus. Patchy supratentorial white matter FLAIR T2 hyperintensities. No suspicious parenchymal signal, masses, mass effect. No abnormal extra-axial fluid collections. No extra-axial masses. VASCULAR: Normal major intracranial vascular flow voids present at skull base. SKULL AND UPPER CERVICAL SPINE: No abnormal sellar expansion. No suspicious calvarial bone marrow signal. Craniocervical junction maintained. SINUSES/ORBITS: Trace RIGHT mastoid effusion. Minimal paranasal sinus mucosal thickening.The included ocular globes and orbital contents are non-suspicious. Status post bilateral ocular lens implants. OTHER: None. IMPRESSION: 1. No acute intracranial process. 2. Moderate parenchymal brain volume loss, advanced for age. 3. Mild chronic small vessel ischemic changes. Electronically Signed   By: Elon Alas M.D.   On: 03/08/2018 06:28    Assessment and Plan:   Atypical chest pain with elevated troponin  -Atypical CP rated severe with alcohol abuse and likely elevated pressures d/t rebound HTN with last drink shortly before elevated BP and CP. CP was severe, non-pleuritic, non-radiating. Alleviated with reduction of pressures (nitro and antihypertensives), aggravated with higher pressures. Previous [facility] ED admission as above  - Risk factors for ischemic etiology of chest pain include history of CAD with CAC score elevated as above, current smoker, alcohol abuse within the last 24-48h, hypertension suboptimally controlled, hyperlipidemia, and sedentary / no exercise at baseline. -No CP at this time. Initial EKG without acute changes. Troponin elevated as above. Continue to cycle until peaks and down-trending from most recent troponin 0.18. Consider elevation of troponin in setting of supply demand ischemia d/t elevated BP with alcohol withdrawal. D/t  risk factors for cardiac etiology as above however (CAC score elevated) and last stress test was in 2016, reasonable to consider repeat NM Study / Lexiscan before discharge or as an outpatient and only  once pressures are controlled with medications. Could also update TTE given uncontrolled HTN as an outpatient. Primary goal at this time is elevated pressures as below.  Essential hypertension -Blood pressure suboptimally controlled and labile. Continue medical management with current antihypertensives including bystolic, cardura, HCTZ, PRN hydralazine. Titrate as HR and BP allow for gradual and steady decrease of pressures. Patient does not appear volume overloaded on exam and renal function at baseline and Cr 0.76.  Once pressures stable, wean as tolerated.   Pure hypercholesterolemia - Statin intolerant  Hypokalemia - Replete with goal 4.0. Check Mg with goal 2.0.  Typical atrial flutter, maintaining NSR - S/p ablation. Continue Eliquis. Daily  CBC  Current Alcohol abuse, Nicotine Abuse - Cessation advised - CIWA protocol     For questions or updates, please contact [facility] Please consult [email or web address] for contact info under     Signed, Arvil Chaco, PA-C  03/08/2018 8:09 AM

## 2018-03-08 NOTE — ED Notes (Signed)
Pt's IV site redressed per pt request. Towel given to pt per request. Pt's linens resituated.

## 2018-03-08 NOTE — Progress Notes (Signed)
Patient admitted to 2A 249 via bed from ED. Patient oriented to floor and surroundings.  Resting comfortably at this time.  Tele placed.

## 2018-03-08 NOTE — H&P (Signed)
[facility] NAME: Peter [name]    MR#:  [id number]  DATE OF BIRTH:  02/18/1955  DATE OF ADMISSION:  03/08/2018  PRIMARY CARE PHYSICIAN: [name], MD   REQUESTING/REFERRING PHYSICIAN: Dr. Owens Shark  CHIEF COMPLAINT:   I'm undergoing alcohol withdrawal and had some chest tightness  -Wife in the ER room HISTORY OF PRESENT ILLNESS:  Peter [name]  is a 63 y.o. male with a known history of atrial fibrillation on eliquis, alcohol abuse, chronic anxiety, arthritis, chronic back pain follows at the pain clinic in [place] comes to the emergency room after he decided to go cold [place] with drinking alcohol. Patient drinks 12 pack of beer on a daily basis. Today he decided to give up alcohol. He is been feeling anxious. His blood pressure has been on the higher side and took extra doses of lisinopril. Patient also noticed some chest tightness came to the emergency room where he is hypertensive and mildly tachycardic. Troponin was .10. No history of coronary artery disease. He sees Dr. Rockey Situ for atrial fibrillation.  pt has been started on alcohol withdrawal CIWA protocol in the ER.  PAST MEDICAL HISTORY:   Past Medical History:  Diagnosis Date  . A-fib ([place])   . Abnormality of gait 09/03/2012  . Alcohol abuse   . Anxiety   . Arthritis   . Arthritis   . Esophageal stricture   . GERD (gastroesophageal reflux disease)   . Gout   . Hyperlipidemia   . Hypertension   . Internal hemorrhoids   . Lesion of ulnar nerve 09/03/2012   Right ulnar neuropathy  . Murmur, cardiac   . PAF (paroxysmal atrial fibrillation) ([place])   . Palpitations   . Prostate cancer ([place]) 2016   treated with radioactive seed implant  . Seizures ([place])    pt states he had seizure 12/14  . Tubular adenoma of colon 03/2008    PAST SURGICAL HISTOIRY:   Past Surgical History:  Procedure Laterality Date  . CATARACT EXTRACTION W/PHACO Right 07/03/2014    Procedure: CATARACT EXTRACTION PHACO AND INTRAOCULAR LENS PLACEMENT (IOC);  Surgeon: Lyla Glassing, MD;  Location: [facility] ORS;  Service: Ophthalmology;  Laterality: Right;  lot # [id number] H [place]: 00:32.3 AP% 9.1 CDE: 2.92  . CIRCUMCISION    . COLONOSCOPY    . ELBOW SURGERY    . ELECTROPHYSIOLOGIC STUDY N/A 11/02/2015   Procedure: CARDIOVERSION;  Surgeon: Minna Merritts, MD;  Location: [facility] ORS;  Service: Cardiovascular;  Laterality: N/A;  . ESOPHAGOGASTRODUODENOSCOPY (EGD) WITH PROPOFOL N/A 05/17/2016   Procedure: ESOPHAGOGASTRODUODENOSCOPY (EGD) WITH PROPOFOL;  Surgeon: Lucilla Lame, MD;  Location: [facility] ENDOSCOPY;  Service: Endoscopy;  Laterality: N/A;  . EYE SURGERY    . HAND SURGERY Left   . SHOULDER ARTHROSCOPY Left   . SHOULDER SURGERY      SOCIAL HISTORY:   Social History   Tobacco Use  . Smoking status: Former Smoker    Packs/day: 0.10    Years: 24.00    Pack years: 2.40    Types: Cigarettes  . Smokeless tobacco: Never Used  . Tobacco comment: down to 4-5  ciggs a day.  Substance Use Topics  . Alcohol use: Yes    Alcohol/week: 2.0 standard drinks    Types: 2 Cans of beer per week    Frequency: Never    FAMILY HISTORY:   Family History  Problem Relation Age of Onset  . Diabetes Father   . Kidney  disease Father   . Lung cancer Father        smoker  . Hyperlipidemia Father   . Hypertension Father   . Lung cancer Mother        smoke  . Hypertension Mother   . Colon cancer Neg Hx   . Pancreatic cancer Neg Hx   . Stomach cancer Neg Hx     DRUG ALLERGIES:   Allergies  Allergen Reactions  . Clonazepam     Syncope  . Crestor [Rosuvastatin] Other (See Comments)    Bloating, swelling of legs, fatty liver  . Dust Mite Extract   . Gabapentin     Leg twitching at night, ineffective   . Hydrocodone Bitartrate Er   . Lipitor [Atorvastatin] Other (See Comments)    Myalgias  . Lisinopril Swelling  . Topamax [Topiramate]     Cognitive clouding    REVIEW OF  SYSTEMS:  Review of Systems  Constitutional: Negative for chills, fever and weight loss.  HENT: Negative for ear discharge, ear pain and nosebleeds.   Eyes: Negative for blurred vision, pain and discharge.  Respiratory: Negative for sputum production, shortness of breath, wheezing and stridor.   Cardiovascular: Positive for chest pain. Negative for palpitations, orthopnea and PND.  Gastrointestinal: Negative for abdominal pain, diarrhea, nausea and vomiting.  Genitourinary: Negative for frequency and urgency.  Musculoskeletal: Positive for back pain. Negative for joint pain.  Neurological: Positive for tremors. Negative for sensory change, speech change, focal weakness and weakness.  Psychiatric/Behavioral: Negative for depression and hallucinations. The patient is nervous/anxious.      MEDICATIONS AT HOME:   Prior to Admission medications   Medication Sig Start Date End Date Taking? Authorizing Provider  albuterol (PROVENTIL HFA;VENTOLIN HFA) 108 (90 Base) MCG/ACT inhaler Inhale 2 puffs into the lungs every 6 (six) hours as needed for wheezing or shortness of breath. 12/27/17   [name], MD  Alirocumab (PRALUENT) 150 MG/ML SOAJ Inject 1 pen into the skin every 14 (fourteen) days. 01/04/18   Minna Merritts, MD  apixaban (ELIQUIS) 5 MG TABS tablet Take 5 mg by mouth 2 (two) times daily.    [provider]  B Complex-C (B-COMPLEX WITH VITAMIN C) tablet Take 1 tablet by mouth daily.    [provider]  busPIRone (BUSPAR) 10 MG tablet TAKE ONE TABLET BY MOUTH THREE TIMES A DAY 12/18/17   Kathrynn Ducking, MD  doxazosin (CARDURA) 8 MG tablet Take 1 tablet (8 mg total) by mouth 2 (two) times daily. 01/30/18   Minna Merritts, MD  doxycycline (VIBRA-TABS) 100 MG tablet Take 1 tablet (100 mg total) by mouth 2 (two) times daily. X 10 days with food 03/06/18   McLean-Scocuzza, Nino Glow, MD  FOLIC ACID PO Take by mouth daily.    [provider]   hydrochlorothiazide (HYDRODIURIL) 12.5 MG tablet Take 1 tablet (12.5 mg total) by mouth daily. In the am for blood pressure 01/23/18   [name], MD  Magnesium 500 MG CAPS Take 1 capsule (500 mg total) by mouth 2 (two) times daily at 8 am and 10 pm. 10/18/17 04/16/18  Milinda Pointer, MD  Melatonin 10 MG TABS Take 1 tablet by mouth at bedtime.    [provider]  milk thistle 175 MG tablet Take 175 mg by mouth daily.    [provider]  Multiple Vitamin (MULTIVITAMIN) tablet Take 1 tablet by mouth daily. Centrum Plus -Take 1 daily    [provider]  nebivolol (BYSTOLIC) 10 MG tablet Take 10 mg by mouth daily.    [provider]  nitroGLYCERIN (NITROSTAT) 0.4 MG SL tablet Place 1 tablet (0.4 mg total) under the tongue every 5 (five) minutes as needed for chest pain. 09/15/17   Minna Merritts, MD  traMADol (ULTRAM) 50 MG tablet TAKE ONE TABLET BY MOUTH EVERY 12 HOURS AS NEEDED FOR MODERATE PAIN 03/08/18   [name], MD  triamcinolone cream (KENALOG) 0.1 % Apply 1 application topically daily as needed.  12/24/14   [provider]  triazolam (HALCION) 0.25 MG tablet Take 1 tab by mouth 1 hour prior to procedure with very light food, do not drive motor vehicle. 02/05/18   Magnus Sinning, MD  VOLTAREN 1 % GEL Apply 2 g topically as needed (knees).  04/02/13   [provider]      VITAL SIGNS:  Blood pressure (!) 190/62, pulse (!) 58, temperature 98.6 F (37 C), temperature source Oral, resp. rate 14, height 6\' 1"  (1.854 m), weight 118.8 kg, SpO2 98 %.  PHYSICAL EXAMINATION:  GENERAL:  63 y.o.-year-old patient lying in the bed with no acute distress.  EYES: Pupils equal, round, reactive to light and accommodation. No scleral icterus. Extraocular muscles intact.  HEENT: Head atraumatic, normocephalic. Oropharynx and nasopharynx clear.  NECK:  Supple, no jugular venous distention. No thyroid enlargement, no tenderness.  LUNGS:  Normal breath sounds bilaterally, no wheezing, rales,rhonchi or crepitation. No use of accessory muscles of respiration.  CARDIOVASCULAR: S1, S2 normal. No murmurs, rubs, or gallops. Tachy+ ABDOMEN: Soft, nontender, nondistended. Bowel sounds present. No organomegaly or mass.  EXTREMITIES: No pedal edema, cyanosis, or clubbing.  NEUROLOGIC: Cranial nerves II through XII are intact. Muscle strength 5/5 in all extremities. Sensation intact. Gait not checked. Tremors+ PSYCHIATRIC: The patient is alert and oriented x 3. anxious SKIN: No obvious rash, lesion, or ulcer.   LABORATORY PANEL:   CBC Recent Labs  Lab 03/07/18 2252  WBC 7.2  HGB 13.4  HCT 36.2*  PLT 66*   ------------------------------------------------------------------------------------------------------------------  Chemistries  Recent Labs  Lab 03/07/18 2252  NA 134*  K 3.7  CL 95*  CO2 26  GLUCOSE 172*  BUN 13  CREATININE 0.76  CALCIUM 9.5  AST 97*  ALT 40  ALKPHOS 92  BILITOT 3.9*   ------------------------------------------------------------------------------------------------------------------  Cardiac Enzymes Recent Labs  Lab 03/07/18 2252  TROPONINI 0.10*   ------------------------------------------------------------------------------------------------------------------  RADIOLOGY:  Dg Chest 2 View  Result Date: 03/07/2018 CLINICAL DATA:  Mid chest pain radiating to the neck. EXAM: CHEST - 2 VIEW COMPARISON:  10/31/2017 FINDINGS: Heart size and pulmonary vascularity are normal. Mild hyperinflation suggesting emphysema. Peribronchial thickening consistent with chronic bronchitis. No airspace disease or consolidation in the lungs. No blunting of costophrenic angles. No pneumothorax. Mediastinal contours appear intact. Calcification of the aorta. Old fracture deformity of the distal right clavicle. Degenerative changes in the spine. IMPRESSION: Emphysematous and chronic bronchitic changes in the lungs.  No evidence of active pulmonary disease. Electronically Signed   By: Lucienne Capers M.D.   On: 03/07/2018 23:18    EKG:   Sinus tachycardia. No acute ST elevation her depression IMPRESSION AND PLAN:    Peter [name]  is a 63 y.o. male with a known history of atrial fibrillation on eliquis, alcohol abuse, chronic anxiety, arthritis, chronic back pain follows at the pain clinic in [place] comes to the emergency room after he decided to go cold [place] with drinking alcohol.  1. Alcohol withdrawal with  anxiety -admit to telemetry -continue alcohol withdrawal inpatient protocol -consider psychiatry consultation in the morning if needed -IV fluids  2. Chest tightness with elevated troponin appears demand ischemia in the setting of hypertension and alcohol withdrawal -continue to monitor cardiac enzymes times three -cardiology consultation with [facility]. Message sent to Dr. Oval Linsey -patient's primary cardiologist is Dr. Rockey Situ -I will give one dose of aspirin 81 mg  3. Chronic a fib on anticoagulation with eliquis  4. Malignant hypertension suspected due to alcohol withdrawal -continue home meds -IV PRN hydralazine  5. DVT prophylaxis already on eliquis  6. Chronic back pain continue tramadol PRN -patient follows with pain clinic in [facility] Dr. Ernestina Patches  Discussed with patient and wife   All the records are reviewed and case discussed with ED provider.   CODE STATUS: full  TOTAL TIME TAKING CARE OF THIS PATIENT: *50* minutes.    Fritzi Mandes M.D on 03/08/2018 at 1:38 AM  Between 7am to 6pm - Pager - [phone]  After 6pm go to [email or web address] - password [email or web address] [facility]  [facility]  Office  [phone]  CC: Primary care physician; [name], MD

## 2018-03-08 NOTE — ED Provider Notes (Addendum)
[facility] Emergency Department Provider Note    First MD Initiated Contact with Patient 03/08/18 0015     (approximate)  I have reviewed the triage vital signs and the nursing notes.   HISTORY  Chief Complaint Chest Pain    HPI Peter [name] is a 63 y.o. male with below list of chronic medical conditions including atrial fibrillation seizures alcohol abuse presents to the emergency department with markedly elevated blood pressure at home today with systolic blood pressures exceeding 200 with associated central chest tightness.  Patient denies any dyspnea no lower extremity pain or swelling.  Patient states that he took a sublingual nitroglycerin before arrival with some relief of discomfort.  Of note patient states that he stopped drinking alcohol as of this morning.  Patient states last EtOH ingestion was last night.   Past Medical History:  Diagnosis Date  . A-fib ([place])   . Abnormality of gait 09/03/2012  . Alcohol abuse   . Anxiety   . Arthritis   . Arthritis   . Esophageal stricture   . GERD (gastroesophageal reflux disease)   . Gout   . Hyperlipidemia   . Hypertension   . Internal hemorrhoids   . Lesion of ulnar nerve 09/03/2012   Right ulnar neuropathy  . Murmur, cardiac   . PAF (paroxysmal atrial fibrillation) ([place])   . Palpitations   . Prostate cancer ([place]) 2016   treated with radioactive seed implant  . Seizures ([place])    pt states he had seizure 12/14  . Tubular adenoma of colon 03/2008    Patient Active Problem List   Diagnosis Date Noted  . Fatigue 01/23/2018  . Bruit of right carotid artery 01/23/2018  . Lightheadedness 01/23/2018  . Neck pain 01/23/2018  . Bronchitis 12/27/2017  . BPH (benign prostatic hyperplasia) 11/08/2017  . Rheumatoid factor positive 10/18/2017  . Hypomagnesemia 10/18/2017  . Chronic anticoagulation (ELIQUIS) 10/18/2017  . Decreased shoulder range of motion (ROM) (Left) 10/18/2017  . Tendinopathy  of rotator cuff (Left) 10/18/2017  . Labral tear of shoulder, sequela (Left) 10/18/2017  . Strain of subscapularis muscle, sequela (Left) 10/18/2017  . Chronic knee pain (Right) 10/18/2017  . Arthropathy of knee (Right) 10/18/2017  . Enthesopathy of knee region (Right) 10/18/2017  . Osteoarthritis of knee (Right) 10/18/2017  . Lumbar spondylosis 10/18/2017  . DDD (degenerative disc disease), lumbar 10/18/2017  . Lumbar facet arthropathy 10/18/2017  . Lumbar facet syndrome 10/18/2017  . Lumbar lateral recess stenosis (Left: L3-4, L4-5) 10/18/2017  . Lumbar central spinal stenosis (L2-3) 10/18/2017  . Lumbar foraminal stenosis 10/18/2017  . History of esophageal stricture 10/18/2017  . Chronic low back pain (Primary Area of Pain) (Bilateral) (L>R) w/ sciatica (Bilateral) 09/28/2017  . Chronic lower extremity pain (Secondary Area of Pain) (Bilateral) (L>R) 09/28/2017  . Chronic pain syndrome 09/28/2017  . Long term current use of opiate analgesic 09/28/2017  . Pharmacologic therapy 09/28/2017  . Disorder of skeletal system 09/28/2017  . Problems influencing health status 09/28/2017  . Chronic thoracic back pain [facility]) ([place]) 09/28/2017  . Atypical chest pain 09/12/2017  . Chronic shoulder pain (Left) 06/12/2017  . Allergic rhinitis 04/24/2017  . Thrombocytopenia ([place]) 01/23/2017  . Polypharmacy 01/20/2017  . Syncope 01/19/2017  . Elevated troponin 01/19/2017  . Sinusitis 06/08/2016  . Tremor 03/23/2016  . Paresthesia 03/23/2016  . Anxiety 02/23/2016  . Typical atrial flutter ([place]) 09/21/2015  . Coronary artery disease of native artery of native heart with  stable angina pectoris ([place]) 05/06/2015  . Aortic atherosclerosis ([place]) 03/20/2015  . History of alcohol abuse 02/04/2015  . Alcohol induced fatty liver 02/04/2015  . Obesity (BMI 30.0-34.9) 02/04/2015  . History of prostate cancer 02/04/2015  . Chronic venous insufficiency 10/12/2012  . Lesion of ulnar  nerve 09/03/2012  . Tobacco abuse 04/05/2011  . Hyperlipidemia 01/21/2010  . Essential hypertension 12/16/2009  . Premature beats 12/16/2009    Past Surgical History:  Procedure Laterality Date  . CATARACT EXTRACTION W/PHACO Right 07/03/2014   Procedure: CATARACT EXTRACTION PHACO AND INTRAOCULAR LENS PLACEMENT (IOC);  Surgeon: Lyla Glassing, MD;  Location: [facility] ORS;  Service: Ophthalmology;  Laterality: Right;  lot # [id number] H [place]: 00:32.3 AP% 9.1 CDE: 2.92  . CIRCUMCISION    . COLONOSCOPY    . ELBOW SURGERY    . ELECTROPHYSIOLOGIC STUDY N/A 11/02/2015   Procedure: CARDIOVERSION;  Surgeon: Minna Merritts, MD;  Location: [facility] ORS;  Service: Cardiovascular;  Laterality: N/A;  . ESOPHAGOGASTRODUODENOSCOPY (EGD) WITH PROPOFOL N/A 05/17/2016   Procedure: ESOPHAGOGASTRODUODENOSCOPY (EGD) WITH PROPOFOL;  Surgeon: Lucilla Lame, MD;  Location: [facility] ENDOSCOPY;  Service: Endoscopy;  Laterality: N/A;  . EYE SURGERY    . HAND SURGERY Left   . SHOULDER ARTHROSCOPY Left   . SHOULDER SURGERY      Prior to Admission medications   Medication Sig Start Date End Date Taking? Authorizing Provider  albuterol (PROVENTIL HFA;VENTOLIN HFA) 108 (90 Base) MCG/ACT inhaler Inhale 2 puffs into the lungs every 6 (six) hours as needed for wheezing or shortness of breath. 12/27/17   [name], MD  Alirocumab (PRALUENT) 150 MG/ML SOAJ Inject 1 pen into the skin every 14 (fourteen) days. 01/04/18   Minna Merritts, MD  apixaban (ELIQUIS) 5 MG TABS tablet Take 5 mg by mouth 2 (two) times daily.    [provider]  B Complex-C (B-COMPLEX WITH VITAMIN C) tablet Take 1 tablet by mouth daily.    [provider]  busPIRone (BUSPAR) 10 MG tablet TAKE ONE TABLET BY MOUTH THREE TIMES A DAY 12/18/17   Kathrynn Ducking, MD  doxazosin (CARDURA) 8 MG tablet Take 1 tablet (8 mg total) by mouth 2 (two) times daily. 01/30/18   Minna Merritts, MD  doxycycline (VIBRA-TABS) 100 MG tablet Take 1 tablet  (100 mg total) by mouth 2 (two) times daily. X 10 days with food 03/06/18   McLean-Scocuzza, Nino Glow, MD  FOLIC ACID PO Take by mouth daily.    [provider]  hydrochlorothiazide (HYDRODIURIL) 12.5 MG tablet Take 1 tablet (12.5 mg total) by mouth daily. In the am for blood pressure 01/23/18   [name], MD  Magnesium 500 MG CAPS Take 1 capsule (500 mg total) by mouth 2 (two) times daily at 8 am and 10 pm. 10/18/17 04/16/18  Milinda Pointer, MD  Melatonin 10 MG TABS Take 1 tablet by mouth at bedtime.    [provider]  milk thistle 175 MG tablet Take 175 mg by mouth daily.    [provider]  Multiple Vitamin (MULTIVITAMIN) tablet Take 1 tablet by mouth daily. Centrum Plus -Take 1 daily    [provider]  nebivolol (BYSTOLIC) 10 MG tablet Take 10 mg by mouth daily.    [provider]  nitroGLYCERIN (NITROSTAT) 0.4 MG SL tablet Place 1 tablet (0.4 mg total) under the tongue every 5 (five) minutes as needed for chest pain. 09/15/17   Minna Merritts, MD  traMADol (ULTRAM) 50 MG  tablet TAKE ONE TABLET BY MOUTH EVERY 12 HOURS AS NEEDED FOR MODERATE PAIN 03/08/18   [name], MD  triamcinolone cream (KENALOG) 0.1 % Apply 1 application topically daily as needed.  12/24/14   [provider]  triazolam (HALCION) 0.25 MG tablet Take 1 tab by mouth 1 hour prior to procedure with very light food, do not drive motor vehicle. 02/05/18   Magnus Sinning, MD  VOLTAREN 1 % GEL Apply 2 g topically as needed (knees).  04/02/13   [provider]    Allergies Clonazepam; Crestor [rosuvastatin]; Dust mite extract; Gabapentin; Hydrocodone bitartrate er; Lipitor [atorvastatin]; Lisinopril; and Topamax [topiramate]  Family History  Problem Relation Age of Onset  . Diabetes Father   . Kidney disease Father   . Lung cancer Father        smoker  . Hyperlipidemia Father   . Hypertension Father   . Lung cancer Mother        smoke  .  Hypertension Mother   . Colon cancer Neg Hx   . Pancreatic cancer Neg Hx   . Stomach cancer Neg Hx     Social History Social History   Tobacco Use  . Smoking status: Former Smoker    Packs/day: 0.10    Years: 24.00    Pack years: 2.40    Types: Cigarettes  . Smokeless tobacco: Never Used  . Tobacco comment: down to 4-5  ciggs a day.  Substance Use Topics  . Alcohol use: Yes    Alcohol/week: 2.0 standard drinks    Types: 2 Cans of beer per week    Frequency: Never  . Drug use: No    Types: Marijuana    Comment: 1970's occ.     Review of Systems Constitutional: No fever/chills Eyes: No visual changes. ENT: No sore throat. Cardiovascular: Denies chest pain. Respiratory: Denies shortness of breath. Gastrointestinal: No abdominal pain.  No nausea, no vomiting.  No diarrhea.  No constipation. Genitourinary: Negative for dysuria. Musculoskeletal: Negative for neck pain.  Negative for back pain. Integumentary: Negative for rash. Neurological: Negative for headaches, focal weakness or numbness.   ____________________________________________   PHYSICAL EXAM:  VITAL SIGNS: ED Triage Vitals  Enc Vitals Group     BP 03/07/18 2251 (!) 180/63     Pulse Rate 03/07/18 2251 60     Resp 03/07/18 2251 18     Temp 03/07/18 2251 98.6 F (37 C)     Temp Source 03/07/18 2251 Oral     SpO2 03/07/18 2251 97 %     Weight 03/07/18 2249 118.8 kg (262 lb)     Height 03/07/18 2249 1.854 m (6\' 1" )     Head Circumference --      Peak Flow --      Pain Score 03/07/18 2249 4     Pain Loc --      Pain Edu? --      Excl. in [place]? --     Constitutional: Alert and oriented.  Tremulous  eyes: Conjunctivae are normal.  Mouth/Throat: Mucous membranes are moist.  Oropharynx non-erythematous. Neck: No stridor.   Cardiovascular: Normal rate, regular rhythm. Good peripheral circulation. Grossly normal heart sounds. Respiratory: Normal respiratory effort.  No retractions. Lungs  CTAB. Gastrointestinal: Soft and nontender. No distention.  Musculoskeletal: No lower extremity tenderness nor edema. No gross deformities of extremities. Neurologic:  Normal speech and language. No gross focal neurologic deficits are appreciated.  Skin:  Skin is warm, dry and intact. No  rash noted. Psychiatric: Mood and affect are normal. Speech and behavior are normal.  ____________________________________________   LABS (all labs ordered are listed, but only abnormal results are displayed)  Labs Reviewed  CBC WITH DIFFERENTIAL/PLATELET - Abnormal; Notable for the following components:      Result Value   RBC 3.38 (*)    HCT 36.2 (*)    MCV 107.1 (*)    MCH 39.6 (*)    MCHC 37.0 (*)    Platelets 66 (*)    All other components within normal limits  COMPREHENSIVE METABOLIC PANEL - Abnormal; Notable for the following components:   Sodium 134 (*)    Chloride 95 (*)    Glucose, Bld 172 (*)    AST 97 (*)    Total Bilirubin 3.9 (*)    All other components within normal limits  TROPONIN I - Abnormal; Notable for the following components:   Troponin I 0.10 (*)    All other components within normal limits   ____________________________________________  EKG ED ECG REPORT I, [place] N Renny Gunnarson, the attending physician, personally viewed and interpreted this ECG.   Date: 03/08/2018  EKG Time: 10:49 PM  Rate: 61  Rhythm: Normal sinus rhythm  Axis: Left axis deviation.  Intervals: Normal  ST&T Change: None  ____________________________________________  RADIOLOGY I, [place] N Bana Borgmeyer, personally viewed and evaluated these images (plain radiographs) as part of my medical decision making, as well as reviewing the written report by the radiologist.  ED MD interpretation: Emphysematous and chronic bronchitic changes noted on chest x-ray per radiologist.  Official radiology report(s): Dg Chest 2 View  Result Date: 03/07/2018 CLINICAL DATA:  Mid chest pain radiating to the neck.  EXAM: CHEST - 2 VIEW COMPARISON:  10/31/2017 FINDINGS: Heart size and pulmonary vascularity are normal. Mild hyperinflation suggesting emphysema. Peribronchial thickening consistent with chronic bronchitis. No airspace disease or consolidation in the lungs. No blunting of costophrenic angles. No pneumothorax. Mediastinal contours appear intact. Calcification of the aorta. Old fracture deformity of the distal right clavicle. Degenerative changes in the spine. IMPRESSION: Emphysematous and chronic bronchitic changes in the lungs. No evidence of active pulmonary disease. Electronically Signed   By: Lucienne Capers M.D.   On: 03/07/2018 23:18     .Critical Care Performed by: Gregor Hams, MD Authorized by: Gregor Hams, MD   Critical care provider statement:    Critical care time (minutes):  30   Critical care time was exclusive of:  Separately billable procedures and treating other patients (Alcohol withdrawal)   Critical care was time spent personally by me on the following activities:  Development of treatment plan with patient or surrogate, discussions with consultants, evaluation of patient's response to treatment, examination of patient, obtaining history from patient or surrogate, ordering and performing treatments and interventions, ordering and review of laboratory studies, ordering and review of radiographic studies, pulse oximetry, re-evaluation of patient's condition and review of old charts     ____________________________________________   INITIAL IMPRESSION / MDM / [place] / ED COURSE  As part of my medical decision making, I reviewed the following data within the electronic MEDICAL RECORD NUMBER  63 year old male presented with above-stated history and physical exam secondary to hypertension and chest tightness.  Considered possibility of alcohol withdrawal with markedly elevated hypertension potentially causing demand ischemia versus CAD/MI.  EKG revealed no  evidence of ischemia or infarction.  Laboratory data however positive for troponin of 0.10.  Patient given aspirin in the emergency department.  Patient also given IV Ativan.  Patient discussed with Dr. Posey Pronto for hospital admission further evaluation and management.     ____________________________________________  FINAL CLINICAL IMPRESSION(S) / ED DIAGNOSES  Final diagnoses:  Alcohol withdrawal syndrome with complication (HCC)  Chest pain, unspecified type     MEDICATIONS GIVEN DURING THIS VISIT:  Medications  LORazepam (ATIVAN) injection 0.5 mg (has no administration in time range)     ED Discharge Orders    None       Note:  This document was prepared using Dragon voice recognition software and may include unintentional dictation errors.   Gregor Hams, MD 03/08/18 0112    Gregor Hams, MD 03/19/18 2252

## 2018-03-08 NOTE — ED Notes (Addendum)
Pt coming in for HTN, takes BP at home 3 times daily and today continued to get high readings with systolic in the 278'N. Pt has taken 4 lisinopril's today which is 2 x his normal dose. Reports he had some chest tightness prior to arrival and took 1 SL nitro. Pt also endorses lower back pain w/o injury. Denies any urinary symptoms. Pt reports to RN that he is a "heavy drinker" and stopped "cold [place]" this morning. Last drink was yesterday evening, pt drinks a 12 pack/ daily on average. Focused assessment completed. Pt placed on cardiac, Spo2, and NIBP monitoring. Evaluated by MD and updated on POC. Wife at bedside and call bell within reach.

## 2018-03-08 NOTE — ED Notes (Signed)
Patient transported to MRI 

## 2018-03-08 NOTE — ED Notes (Signed)
Pt provided with lunch tray at this time.

## 2018-03-08 NOTE — ED Notes (Signed)
Dr. Gilmer Mor, cardiologist at bedside. Verbal orders to give 1mg  of ativan IV and prn hydralazine for BP. Bp is 322 systolic.

## 2018-03-08 NOTE — ED Notes (Signed)
Rounded on patient. Patient requested his covers to be adjusted, his socks to be pulled up, and head lowered

## 2018-03-08 NOTE — BH Assessment (Signed)
Assessment Note  Peter [name] is an 63 y.o. male per Triage RN: Pt to triage via w/c with no distress noted; pt reports mid CP radiating into neck/back; denies any accomp symptoms; took NTG hr PTA with some relief; st hx of same with afib; st BP at home 212/80        Pt being admitted to the medical floor.   Diagnosis: Alcohol Abuse   Past Medical History:  Past Medical History:  Diagnosis Date  . A-fib ([place])   . Abnormality of gait 09/03/2012  . Alcohol abuse   . Anxiety   . Arthritis   . Arthritis   . Esophageal stricture   . GERD (gastroesophageal reflux disease)   . Gout   . Hyperlipidemia   . Hypertension   . Internal hemorrhoids   . Lesion of ulnar nerve 09/03/2012   Right ulnar neuropathy  . Murmur, cardiac   . PAF (paroxysmal atrial fibrillation) ([place])   . Palpitations   . Prostate cancer ([place]) 2016   treated with radioactive seed implant  . Seizures ([place])    pt states he had seizure 12/14  . Tubular adenoma of colon 03/2008    Past Surgical History:  Procedure Laterality Date  . CATARACT EXTRACTION W/PHACO Right 07/03/2014   Procedure: CATARACT EXTRACTION PHACO AND INTRAOCULAR LENS PLACEMENT (IOC);  Surgeon: Lyla Glassing, MD;  Location: [facility] ORS;  Service: Ophthalmology;  Laterality: Right;  lot # [id number] [place]: 00:32.3 AP% 9.1 CDE: 2.92  . CIRCUMCISION    . COLONOSCOPY    . ELBOW SURGERY    . ELECTROPHYSIOLOGIC STUDY N/A 11/02/2015   Procedure: CARDIOVERSION;  Surgeon: Minna Merritts, MD;  Location: [facility] ORS;  Service: Cardiovascular;  Laterality: N/A;  . ESOPHAGOGASTRODUODENOSCOPY (EGD) WITH PROPOFOL N/A 05/17/2016   Procedure: ESOPHAGOGASTRODUODENOSCOPY (EGD) WITH PROPOFOL;  Surgeon: Lucilla Lame, MD;  Location: [facility] ENDOSCOPY;  Service: Endoscopy;  Laterality: N/A;  . EYE SURGERY    . HAND SURGERY Left   . SHOULDER ARTHROSCOPY Left   . SHOULDER SURGERY      Family History:  Family History  Problem Relation Age of Onset  . Diabetes Father   .  Kidney disease Father   . Lung cancer Father        smoker  . Hyperlipidemia Father   . Hypertension Father   . Lung cancer Mother        smoke  . Hypertension Mother   . Colon cancer Neg Hx   . Pancreatic cancer Neg Hx   . Stomach cancer Neg Hx     Social History:  reports that he has quit smoking. His smoking use included cigarettes. He has a 2.40 pack-year smoking history. He has never used smokeless tobacco. He reports current alcohol use of about 2.0 standard drinks of alcohol per week. He reports that he does not use drugs.  Additional Social History:     CIWA: CIWA-Ar BP: (!) 205/72 Pulse Rate: (!) 110 Nausea and Vomiting: mild nausea with no vomiting Tactile Disturbances: none Tremor: two Auditory Disturbances: not present Paroxysmal Sweats: no sweat visible Visual Disturbances: not present Anxiety: mildly anxious Headache, Fullness in Head: none present Agitation: somewhat more than normal activity Orientation and Clouding of Sensorium: oriented and can do serial additions CIWA-Ar Total: 5 COWS:    Allergies:  Allergies  Allergen Reactions  . Hydrocodone Bitartrate Er Other (See Comments)    Makes him "feel funny"  . Clonazepam     Syncope  . Crestor [Rosuvastatin]  Other (See Comments)    Bloating, swelling of legs, fatty liver  . Dust Mite Extract   . Gabapentin     Leg twitching at night, ineffective   . Lipitor [Atorvastatin] Other (See Comments)    Myalgias  . Lisinopril Swelling  . Topamax [Topiramate]     Cognitive clouding    Home Medications: (Not in a hospital admission)   OB/GYN Status:  No LMP for male patient.  General Assessment Data Assessment unable to be completed: Yes Reason for not completing assessment: Pt is being admitted medically                                                  Advance Directives (For Healthcare) Does Patient Have a Medical Advance Directive?: No Would patient like information  on creating a medical advance directive?: No - Patient declined          Disposition:     On Site Evaluation by:   Reviewed with Physician:    Trenell Concannon D Nayab Aten 03/08/2018 2:15 AM

## 2018-03-08 NOTE — Progress Notes (Signed)
[facility] NAME: Baird Polinski    MR#:  [id number]  DATE OF BIRTH:  [birth year]  SUBJECTIVE:  CHIEF COMPLAINT:   Chief Complaint  Patient presents with  . Chest Pain    No new complaint this morning.  No chest pain.  No fevers.  Had some mild tremors but no obvious evidence of withdrawal at this time.  REVIEW OF SYSTEMS:  Review of Systems  Constitutional: Negative for chills and fever.  HENT: Negative for tinnitus.   Eyes: Negative for blurred vision, double vision and photophobia.  Respiratory: Negative for cough, hemoptysis and shortness of breath.   Cardiovascular: Negative for palpitations.       Chest pain resolved  Gastrointestinal: Negative for heartburn, nausea and vomiting.  Genitourinary: Negative for dysuria and urgency.  Musculoskeletal: Negative for myalgias and neck pain.  Skin: Negative for itching and rash.  Neurological: Negative for dizziness and headaches.  Psychiatric/Behavioral: Negative for depression and suicidal ideas.    DRUG ALLERGIES:   Allergies  Allergen Reactions  . Hydrocodone Bitartrate Er Other (See Comments)    Makes him "feel funny"  . Clonazepam     Syncope  . Crestor [Rosuvastatin] Other (See Comments)    Bloating, swelling of legs, fatty liver  . Dust Mite Extract   . Gabapentin     Leg twitching at night, ineffective   . Lipitor [Atorvastatin] Other (See Comments)    Myalgias  . Lisinopril Swelling  . Topamax [Topiramate]     Cognitive clouding   VITALS:  Blood pressure (!) 179/60, pulse 62, temperature 98.6 F (37 C), temperature source Oral, resp. rate 16, height 6\' 1"  (1.854 m), weight 118.8 kg, SpO2 93 %. PHYSICAL EXAMINATION:   Physical Exam  Constitutional: He is oriented to person, place, and time. He appears well-developed and well-nourished.  HENT:  Head: Normocephalic and atraumatic.  Eyes: Pupils are equal, round, and reactive to light. Conjunctivae and  EOM are normal.  Neck: Normal range of motion. No tracheal deviation present. No thyromegaly present.  Cardiovascular: Normal rate, regular rhythm and normal heart sounds.  Respiratory: Effort normal and breath sounds normal. He has no wheezes.  GI: Soft. Bowel sounds are normal.  Musculoskeletal: Normal range of motion.        General: No edema.     Comments: Very mild tremors on exam  Neurological: He is alert and oriented to person, place, and time. He has normal reflexes. No cranial nerve deficit.  Skin: Skin is warm. He is not diaphoretic. No erythema.  Psychiatric: He has a normal mood and affect.   LABORATORY PANEL:  Male CBC Recent Labs  Lab 03/07/18 2252  WBC 7.2  HGB 13.4  HCT 36.2*  PLT 66*   ------------------------------------------------------------------------------------------------------------------ Chemistries  Recent Labs  Lab 03/07/18 2252  NA 134*  K 3.7  CL 95*  CO2 26  GLUCOSE 172*  BUN 13  CREATININE 0.76  CALCIUM 9.5  AST 97*  ALT 40  ALKPHOS 92  BILITOT 3.9*   RADIOLOGY:  Dg Chest 2 View  Result Date: 03/07/2018 CLINICAL DATA:  Mid chest pain radiating to the neck. EXAM: CHEST - 2 VIEW COMPARISON:  10/31/2017 FINDINGS: Heart size and pulmonary vascularity are normal. Mild hyperinflation suggesting emphysema. Peribronchial thickening consistent with chronic bronchitis. No airspace disease or consolidation in the lungs. No blunting of costophrenic angles. No pneumothorax. Mediastinal contours appear intact. Calcification of the aorta. Old fracture deformity of the  distal right clavicle. Degenerative changes in the spine. IMPRESSION: Emphysematous and chronic bronchitic changes in the lungs. No evidence of active pulmonary disease. Electronically Signed   By: Lucienne Capers M.D.   On: 03/07/2018 23:18   Mr Brain Wo Contrast  Result Date: 03/08/2018 CLINICAL DATA:  Periorbital headache for 10 days. Hypertensive. History of hypertension,  hyperlipidemia, alcohol abuse, seizures. EXAM: MRI HEAD WITHOUT CONTRAST TECHNIQUE: Multiplanar, multiecho pulse sequences of the brain and surrounding structures were obtained without intravenous contrast. COMPARISON:  CT HEAD January 19, 2017 FINDINGS: INTRACRANIAL CONTENTS: No reduced diffusion to suggest acute ischemia. No susceptibility artifact to suggest hemorrhage. Moderate parenchymal brain volume loss for age. No hydrocephalus. Patchy supratentorial white matter FLAIR T2 hyperintensities. No suspicious parenchymal signal, masses, mass effect. No abnormal extra-axial fluid collections. No extra-axial masses. VASCULAR: Normal major intracranial vascular flow voids present at skull base. SKULL AND UPPER CERVICAL SPINE: No abnormal sellar expansion. No suspicious calvarial bone marrow signal. Craniocervical junction maintained. SINUSES/ORBITS: Trace RIGHT mastoid effusion. Minimal paranasal sinus mucosal thickening.The included ocular globes and orbital contents are non-suspicious. Status post bilateral ocular lens implants. OTHER: None. IMPRESSION: 1. No acute intracranial process. 2. Moderate parenchymal brain volume loss, advanced for age. 3. Mild chronic small vessel ischemic changes. Electronically Signed   By: Elon Alas M.D.   On: 03/08/2018 06:28   ASSESSMENT AND PLAN:    Coolidge Gossard  is a 63 y.o. male with a known history of atrial fibrillation on eliquis, alcohol abuse, chronic anxiety, arthritis, chronic back pain follows at the pain clinic in [place] comes to the emergency room after he decided to go cold [place] with drinking alcohol.  1. Alcohol withdrawal with anxiety -admit to telemetry -continue alcohol withdrawal inpatient protocol .  Patient had some minor tremors this morning but no obvious evidence of withdrawal at this time. -consider psychiatry consultation in the morning if needed -IV fluids  2. Chest tightness with elevated troponin appears demand ischemia in  the setting of hypertension and alcohol withdrawal Maximum troponin of 0.18.  Patient seen by cardiologist.  Felt to be secondary to demand ischemia.  Patient declined ischemic work-up at this time.  Please refer to cardiology consult note for further details.  Continue aspirin, statin and beta-blocker. Patient already on anticoagulation with Eliquis as such heparin drip not initiated since unable to exclude esophageal varices or liver cirrhosis given history of chronic alcohol abuse.  Cardiologist recommends conservative management at this time without heparin.  3. Chronic a fib on anticoagulation with eliquis Continue rate control with beta-blocker  4. Malignant hypertension suspected due to alcohol withdrawal Blood pressure control improving.  Monitor. -continue home meds -IV PRN hydralazine  5. Chronic back pain continue tramadol PRN -patient follows with pain clinic in [facility] Dr. Ernestina Patches  6.  Thrombocytopenia Most likely related to chronic alcohol abuse.   DVT prophylaxis already on eliquis  All the records are reviewed and case discussed with Care Management/Social Worker. Management plans discussed with the patient, family and they are in agreement.  CODE STATUS: Full Code  TOTAL TIME TAKING CARE OF THIS PATIENT: 36 minutes.   More than 50% of the time was spent in counseling/coordination of care: YES  POSSIBLE D/C IN 2 DAYS, DEPENDING ON CLINICAL CONDITION.   Idelle Reimann M.D on 03/08/2018 at 3:25 PM  Between 7am to 6pm - Pager - [phone]  After 6pm go to [email or web address] - [employer] [facility]  Office  [phone]  CC: Primary  care physician; [name], MD  Note: This dictation was prepared with Dragon dictation along with smaller phrase technology. Any transcriptional errors that result from this process are unintentional.

## 2018-03-09 DIAGNOSIS — I480 Paroxysmal atrial fibrillation: Secondary | ICD-10-CM

## 2018-03-09 DIAGNOSIS — F10231 Alcohol dependence with withdrawal delirium: Secondary | ICD-10-CM

## 2018-03-09 LAB — CBC
HCT: 35.2 % — ABNORMAL LOW (ref 39.0–52.0)
Hemoglobin: 12.8 g/dL — ABNORMAL LOW (ref 13.0–17.0)
MCH: 38.8 pg — ABNORMAL HIGH (ref 26.0–34.0)
MCHC: 36.4 g/dL — ABNORMAL HIGH (ref 30.0–36.0)
MCV: 106.7 fL — ABNORMAL HIGH (ref 80.0–100.0)
Platelets: 65 10*3/uL — ABNORMAL LOW (ref 150–400)
RBC: 3.3 MIL/uL — ABNORMAL LOW (ref 4.22–5.81)
RDW: 13.7 % (ref 11.5–15.5)
WBC: 8.8 10*3/uL (ref 4.0–10.5)
nRBC: 0 % (ref 0.0–0.2)

## 2018-03-09 LAB — BASIC METABOLIC PANEL
Anion gap: 13 (ref 5–15)
BUN: 23 mg/dL (ref 8–23)
CO2: 25 mmol/L (ref 22–32)
Calcium: 9.3 mg/dL (ref 8.9–10.3)
Chloride: 94 mmol/L — ABNORMAL LOW (ref 98–111)
Creatinine, Ser: 0.87 mg/dL (ref 0.61–1.24)
GFR calc Af Amer: >60 mL/min (ref >60)
GFR calc non Af Amer: >60 mL/min (ref >60)
Glucose, Bld: 127 mg/dL — ABNORMAL HIGH (ref 70–99)
Potassium: 3.3 mmol/L — ABNORMAL LOW (ref 3.5–5.1)
SODIUM: 132 mmol/L — AB (ref 135–145)

## 2018-03-09 LAB — TROPONIN I
TROPONIN I: 0.27 ng/mL — AB (ref <0.03)
Troponin I: 0.26 ng/mL (ref <0.03)
Troponin I: 0.26 ng/mL (ref <0.03)
Troponin I: 0.23 ng/mL (ref <0.03)

## 2018-03-09 LAB — MAGNESIUM: Magnesium: 1.3 mg/dL — ABNORMAL LOW (ref 1.7–2.4)

## 2018-03-09 MED ORDER — AMLODIPINE BESYLATE 5 MG PO TABS
5.0000 mg | ORAL_TABLET | Freq: Every day | ORAL | Status: DC
  Administered 2018-03-09 – 2018-03-10 (×2): 5 mg via ORAL
  Filled 2018-03-09 (×2): qty 1

## 2018-03-09 MED ORDER — HYDRALAZINE HCL 50 MG PO TABS
75.0000 mg | ORAL_TABLET | Freq: Three times a day (TID) | ORAL | Status: DC
  Administered 2018-03-09 – 2018-03-10 (×2): 75 mg via ORAL
  Filled 2018-03-09 (×3): qty 1

## 2018-03-09 MED ORDER — MAGNESIUM SULFATE 2 GM/50ML IV SOLN
2.0000 g | Freq: Once | INTRAVENOUS | Status: AC
  Administered 2018-03-09: 2 g via INTRAVENOUS
  Filled 2018-03-09: qty 50

## 2018-03-09 MED ORDER — POTASSIUM CHLORIDE CRYS ER 20 MEQ PO TBCR
40.0000 meq | EXTENDED_RELEASE_TABLET | Freq: Once | ORAL | Status: AC
  Administered 2018-03-09: 40 meq via ORAL
  Filled 2018-03-09: qty 2

## 2018-03-09 MED ORDER — LORAZEPAM 2 MG/ML IJ SOLN
2.0000 mg | Freq: Once | INTRAMUSCULAR | Status: AC
  Administered 2018-03-09: 2 mg via INTRAVENOUS

## 2018-03-09 NOTE — Progress Notes (Addendum)
Progress Note  Patient Name: Peter [name] Date of Encounter: 03/09/2018  Primary Cardiologist: Ida Rogue, MD  Subjective   Agitated overnight and required IV ativan this AM.  Now very sleepy, but does awaken to answer questions.  No chest pain or sob.  Inpatient Medications    Scheduled Meds: . amLODipine  5 mg Oral Daily  . apixaban  5 mg Oral BID  . aspirin  81 mg Oral Daily  . B-complex with vitamin C  1 tablet Oral Daily  . busPIRone  10 mg Oral TID  . doxazosin  8 mg Oral BID  . doxycycline  100 mg Oral BID  . folic acid  1 mg Oral Daily  . hydrALAZINE  50 mg Oral Q6H  . hydrochlorothiazide  12.5 mg Oral Daily  . LORazepam  0-4 mg Intravenous Q6H   Or  . LORazepam  0-4 mg Oral Q6H  . [START ON 03/10/2018] LORazepam  0-4 mg Intravenous Q12H   Or  . [START ON 03/10/2018] LORazepam  0-4 mg Oral Q12H  . magnesium oxide  400 mg Oral BID AC & HS  . multivitamin with minerals  1 tablet Oral Daily  . nebivolol  10 mg Oral Daily  . thiamine  100 mg Oral Daily   Or  . thiamine  100 mg Intravenous Daily   Continuous Infusions:  PRN Meds: acetaminophen **OR** acetaminophen, albuterol, bisacodyl, hydrALAZINE, nitroGLYCERIN, ondansetron **OR** ondansetron (ZOFRAN) IV, traMADol   Vital Signs    Vitals:   03/09/18 0550 03/09/18 0600 03/09/18 0635 03/09/18 0959  BP: (!) 151/63 (!) 151/63 (!) 165/65 (!) 126/47  Pulse: 76 76 79 77  Resp: 18     Temp: 97.7 F (36.5 C)   98.2 F (36.8 C)  TempSrc: Oral   Oral  SpO2: 97%   95%  Weight:      Height:        Intake/Output Summary (Last 24 hours) at 03/09/2018 1120 Last data filed at 03/08/2018 1942 Gross per 24 hour  Intake -  Output 550 ml  Net -550 ml   Filed Weights   03/07/18 2249  Weight: 118.8 kg    Physical Exam   GEN: Well nourished, well developed, in no acute distress.  HEENT: Grossly normal.  Neck: Supple, no JVD, bilat carotid bruits, no masses. Cardiac: RRR, 2/6 syst murmur loudest @ RUSB  and apex, no rubs, or gallops. No clubbing, cyanosis, edema.  Radials/DP/PT 2+ and equal bilaterally.  Respiratory:  Respirations regular and unlabored, diminished breath sounds @ bases - poor effort. GI: Soft, nontender, nondistended, BS + x 4. MS: no deformity or atrophy. Skin: warm and dry, no rash. Neuro:  Strength and sensation are intact. Psych: AAOx3.  Normal affect.  Labs    Chemistry Recent Labs  Lab 03/07/18 2252 03/09/18 0352  NA 134* 132*  K 3.7 3.3*  CL 95* 94*  CO2 26 25  GLUCOSE 172* 127*  BUN 13 23  CREATININE 0.76 0.87  CALCIUM 9.5 9.3  PROT 7.6  --   ALBUMIN 3.6  --   AST 97*  --   ALT 40  --   ALKPHOS 92  --   BILITOT 3.9*  --   GFRNONAA >60 >60  GFRAA >60 >60  ANIONGAP 13 13     Hematology Recent Labs  Lab 03/07/18 2252 03/09/18 0352  WBC 7.2 8.8  RBC 3.38* 3.30*  HGB 13.4 12.8*  HCT 36.2* 35.2*  MCV 107.1* 106.7*  MCH 39.6* 38.8*  MCHC 37.0* 36.4*  RDW 13.3 13.7  PLT 66* 65*    Cardiac Enzymes Recent Labs  Lab 03/08/18 1605 03/08/18 2127 03/09/18 0352 03/09/18 0928  TROPONINI 0.16* 0.21* 0.26* 0.27*      Radiology    Dg Chest 2 View  Result Date: 03/07/2018 CLINICAL DATA:  Mid chest pain radiating to the neck. EXAM: CHEST - 2 VIEW COMPARISON:  10/31/2017 FINDINGS: Heart size and pulmonary vascularity are normal. Mild hyperinflation suggesting emphysema. Peribronchial thickening consistent with chronic bronchitis. No airspace disease or consolidation in the lungs. No blunting of costophrenic angles. No pneumothorax. Mediastinal contours appear intact. Calcification of the aorta. Old fracture deformity of the distal right clavicle. Degenerative changes in the spine. IMPRESSION: Emphysematous and chronic bronchitic changes in the lungs. No evidence of active pulmonary disease. Electronically Signed   By: Lucienne Capers M.D.   On: 03/07/2018 23:18   Mr Brain Wo Contrast  Result Date: 03/08/2018 CLINICAL DATA:  Periorbital headache  for 10 days. Hypertensive. History of hypertension, hyperlipidemia, alcohol abuse, seizures. EXAM: MRI HEAD WITHOUT CONTRAST TECHNIQUE: Multiplanar, multiecho pulse sequences of the brain and surrounding structures were obtained without intravenous contrast. COMPARISON:  CT HEAD January 19, 2017 FINDINGS: INTRACRANIAL CONTENTS: No reduced diffusion to suggest acute ischemia. No susceptibility artifact to suggest hemorrhage. Moderate parenchymal brain volume loss for age. No hydrocephalus. Patchy supratentorial white matter FLAIR T2 hyperintensities. No suspicious parenchymal signal, masses, mass effect. No abnormal extra-axial fluid collections. No extra-axial masses. VASCULAR: Normal major intracranial vascular flow voids present at skull base. SKULL AND UPPER CERVICAL SPINE: No abnormal sellar expansion. No suspicious calvarial bone marrow signal. Craniocervical junction maintained. SINUSES/ORBITS: Trace RIGHT mastoid effusion. Minimal paranasal sinus mucosal thickening.The included ocular globes and orbital contents are non-suspicious. Status post bilateral ocular lens implants. OTHER: None. IMPRESSION: 1. No acute intracranial process. 2. Moderate parenchymal brain volume loss, advanced for age. 3. Mild chronic small vessel ischemic changes. Electronically Signed   By: Elon Alas M.D.   On: 03/08/2018 06:28    Telemetry    Sinus w/ freq PVC's/bigeminy - Personally Reviewed  Cardiac Studies   2D Echocardiogram 01/2017  Study Conclusions   - Left ventricle: The cavity size was normal. There was mild   concentric hypertrophy. Systolic function was vigorous. The   estimated ejection fraction was in the range of 65% to 70%. Wall   motion was normal; there were no regional wall motion   abnormalities. Doppler parameters are consistent with abnormal   left ventricular relaxation (grade 1 diastolic dysfunction). - Left atrium: The atrium was mildly dilated. - Right atrium: The atrium was mildly  dilated.   Patient Profile     63 y.o. male w/ a h/o atrial flutter s/p prior DCCV (2017), etoh abuse, HTN, HL (statin intol), tob abuse, coronary calcium, GIB, (2018), back pain, GERD, gout, sz d/o, and anxiety, who was admitted 2/27 w/ c/p and etoh w/d.  Assessment & Plan    1.  Hypertensive Urgency: BP's elevated throughout the day yesterday.  More variable this MA - 120's to 160's.  Cont  blocker, HCTZ, doxazosin, and hydralazine.  Amlodipine added today.  Consolidate hydral to TID dosing.  2.  Chest pain/Demand Ischemia/CAD: Troponin cont to rise in the setting of admission w/ HTN urgency and chest pain.  Currently 0.27.  No recurrent c/p.  Known significant coronary calcium by CT in 2017 (99th percentile) predominantly in the LAD, diag, and ost/prox RCA. Was not having c/p @  that time and had low risk stress test in 2016.  No c/p this AM.  Echo pending to re-eval EF.  If EF now down or new WMA, will need to consider cath following alcohol withdrawal.  If EF nl, can consider outpt stress test (prev wished to defer inpt testing). Cont  blocker.  Intolerant to statins and was on praluent @ home. No ASA in setting of eliquis.  3.  PVC's: Freq PVC's and bigeminy in setting of hypoK and hypoMg. ? Role of ischemia.  Supplementation ordered. Cont  blocker.  F/u echo in setting of above.  4.  Hypokalemia/Hypomagnesemia:  Supplementation provided earlier this AM. Likely contributing to ventricular ectopy.  5.  ETOH abuse/withdrawal: CIWA protocol per IM. Agitated earlier this AM s/p IV ativan.  6.  Atrial Flutter:  S/p RFCA.  In sinus.  Cont nebivolol and eliquis.  7.  Systolic murmur: loudest @ RUSB and apex.  No significant valvular dzs last year. Echo pending.  8.  Carotid arterial dzs: w/ bilat bruits. U/S in Jan 2020 w/ 60-79% bilat ICA stenosis.  He has f/u with Dr. Lucky Cowboy on 3/6.  Signed, Murray Hodgkins, NP  03/09/2018, 11:20 AM    For questions or updates, please contact   Please  consult [email or web address] for contact info under Cardiology/STEMI.

## 2018-03-09 NOTE — Plan of Care (Signed)
  Problem: Education: Goal: Knowledge of General Education information will improve Description: Including pain rating scale, medication(s)/side effects and non-pharmacologic comfort measures Outcome: Progressing   Problem: Pain Managment: Goal: General experience of comfort will improve Outcome: Progressing   

## 2018-03-09 NOTE — Progress Notes (Addendum)
Pt is threatening the staff that his going to Leave AMA. Notify Supervisor and Dr. Brett Albino. Dr. Brett Albino placed an order of lorazepam (Ativan) 2 mg IV once. Will continue to monitor.  Update 0351: Lorazepam (Ativan) 2mg /27ml IV one time order was administered. Will continue to monitor.

## 2018-03-09 NOTE — Progress Notes (Signed)
Peter [name] at [place] NAME: Peter [name]    MR#:  [id number]  DATE OF BIRTH:  June 14, 1955  SUBJECTIVE:  CHIEF COMPLAINT:   Chief Complaint  Patient presents with  . Chest Pain    Patient initially admitted with symptoms of alcohol withdrawal and atypical chest pain. Said to have become more agitated and confused last night.  This was confirmed by wife at bedside.  Patient already given a dose of Ativan prior to my evaluation and was resting comfortably this morning.  REVIEW OF SYSTEMS:  Review of Systems  Constitutional: Negative for chills and fever.  HENT: Negative for tinnitus.   Eyes: Negative for blurred vision, double vision and photophobia.  Respiratory: Negative for cough, hemoptysis and shortness of breath.   Cardiovascular: Negative for palpitations.       Chest pain resolved  Gastrointestinal: Negative for heartburn, nausea and vomiting.  Genitourinary: Negative for dysuria and urgency.  Musculoskeletal: Negative for myalgias and neck pain.  Skin: Negative for itching and rash.  Neurological: Negative for dizziness and headaches.  Psychiatric/Behavioral: Negative for depression and suicidal ideas.    DRUG ALLERGIES:   Allergies  Allergen Reactions  . Hydrocodone Bitartrate Er Other (See Comments)    Makes him "feel funny"  . Clonazepam     Syncope  . Crestor [Rosuvastatin] Other (See Comments)    Bloating, swelling of legs, fatty liver  . Dust Mite Extract   . Gabapentin     Leg twitching at night, ineffective   . Lipitor [Atorvastatin] Other (See Comments)    Myalgias  . Lisinopril Swelling  . Topamax [Topiramate]     Cognitive clouding   VITALS:  Blood pressure (!) 109/44, pulse 65, temperature 98.2 F (36.8 C), temperature source Oral, resp. rate 18, height 6\' 1"  (1.854 m), weight 118.8 kg, SpO2 100 %. PHYSICAL EXAMINATION:   Physical Exam  Constitutional: He is oriented to person, place, and time. He  appears well-developed and well-nourished.  Patient resting comfortably at this time  HENT:  Head: Normocephalic and atraumatic.  Eyes: Pupils are equal, round, and reactive to light. Conjunctivae are normal. Right eye exhibits no discharge.  Neck: Normal range of motion. No tracheal deviation present. No thyromegaly present.  Cardiovascular: Normal rate, regular rhythm and normal heart sounds.  Respiratory: Effort normal and breath sounds normal. He has no wheezes.  GI: Soft. Bowel sounds are normal.  Musculoskeletal: Normal range of motion.        General: No edema.  Neurological: He is alert and oriented to person, place, and time. He has normal reflexes. No cranial nerve deficit.  Skin: Skin is warm. He is not diaphoretic. No erythema.  Psychiatric: He has a normal mood and affect.   LABORATORY PANEL:  Male CBC Recent Labs  Lab 03/09/18 0352  WBC 8.8  HGB 12.8*  HCT 35.2*  PLT 65*   ------------------------------------------------------------------------------------------------------------------ Chemistries  Recent Labs  Lab 03/07/18 2252 03/09/18 0352  NA 134* 132*  K 3.7 3.3*  CL 95* 94*  CO2 26 25  GLUCOSE 172* 127*  BUN 13 23  CREATININE 0.76 0.87  CALCIUM 9.5 9.3  MG  --  1.3*  AST 97*  --   ALT 40  --   ALKPHOS 92  --   BILITOT 3.9*  --    RADIOLOGY:  No results found. ASSESSMENT AND PLAN:    Peter [name]  is a 63 y.o. male with a known history of  atrial fibrillation on eliquis, alcohol abuse, chronic anxiety, arthritis, chronic back pain follows at the pain clinic in [place] comes to the emergency room after he decided to go cold [place] with drinking alcohol.  1. Alcohol withdrawal with anxiety -admit to telemetry -continue alcohol withdrawal inpatient protocol .  Patient reported to have become very agitated and confused last night.  Given Ativan.  This morning resting comfortably after Ativan administration  2. Chest tightness with elevated  troponin appears demand ischemia in the setting of hypertension and alcohol withdrawal Maximum troponin of 0.18.  Patient seen by cardiologist.  Felt to be secondary to demand ischemia.  Patient declined ischemic work-up at this time. Continue aspirin, statin and beta-blocker. Patient already on anticoagulation with Eliquis as such heparin drip not initiated since unable to exclude esophageal varices or liver cirrhosis given history of chronic alcohol abuse.  Cardiologist recommends conservative management at this time without heparin.  Outpatient follow-up with cardiologist  3. Chronic a fib on anticoagulation with eliquis Continue rate control with beta-blocker  4. Malignant hypertension suspected due to alcohol withdrawal Blood pressure better controlled this morning. -continue home meds -IV PRN hydralazine  5. Chronic back pain continue tramadol PRN -patient follows with pain clinic in [facility] Dr. Ernestina Patches  6.  Thrombocytopenia Most likely related to chronic alcohol abuse.   DVT prophylaxis already on eliquis  All the records are reviewed and case discussed with Care Management/Social Worker. Management plans discussed with the patient, family and they are in agreement.  CODE STATUS: Full Code  TOTAL TIME TAKING CARE OF THIS PATIENT: 27 minutes.   More than 50% of the time was spent in counseling/coordination of care: YES  POSSIBLE D/C IN 2 DAYS, DEPENDING ON CLINICAL CONDITION.   Peter [name] M.D on 03/09/2018 at 3:37 PM  Between 7am to 6pm - Pager - [phone]  After 6pm go to [email or web address] - [employer] [facility] [place]  Office  [phone]  CC: Primary care physician; Peter Haven, MD  Note: This dictation was prepared with Dragon dictation along with smaller phrase technology. Any transcriptional errors that result from this process are unintentional.

## 2018-03-09 NOTE — Progress Notes (Signed)
Patient becoming increasingly lucid throughout shift today, able to tolerate lunch tray this afternoon, most recent CIWA score negative for withdrawal symptoms. Patient resting comfortably this afternoon.

## 2018-03-10 ENCOUNTER — Inpatient Hospital Stay (HOSPITAL_COMMUNITY)
Admit: 2018-03-10 | Discharge: 2018-03-10 | Disposition: A | Discharge status: Home / Self Care | Payer: Medicare Other | Attending: Nurse Practitioner | Admitting: Nurse Practitioner

## 2018-03-10 DIAGNOSIS — I361 Nonrheumatic tricuspid (valve) insufficiency: Secondary | ICD-10-CM

## 2018-03-10 LAB — CBC
HCT: 34.2 % — ABNORMAL LOW (ref 39.0–52.0)
Hemoglobin: 12.3 g/dL — ABNORMAL LOW (ref 13.0–17.0)
MCH: 39.3 pg — ABNORMAL HIGH (ref 26.0–34.0)
MCHC: 36 g/dL (ref 30.0–36.0)
MCV: 109.3 fL — ABNORMAL HIGH (ref 80.0–100.0)
Platelets: 67 10*3/uL — ABNORMAL LOW (ref 150–400)
RBC: 3.13 MIL/uL — ABNORMAL LOW (ref 4.22–5.81)
RDW: 14.2 % (ref 11.5–15.5)
WBC: 7.7 10*3/uL (ref 4.0–10.5)
nRBC: 0 % (ref 0.0–0.2)

## 2018-03-10 LAB — BASIC METABOLIC PANEL
Anion gap: 11 (ref 5–15)
BUN: 32 mg/dL — ABNORMAL HIGH (ref 8–23)
CO2: 24 mmol/L (ref 22–32)
Calcium: 9.2 mg/dL (ref 8.9–10.3)
Chloride: 95 mmol/L — ABNORMAL LOW (ref 98–111)
Creatinine, Ser: 0.94 mg/dL (ref 0.61–1.24)
GFR calc non Af Amer: >60 mL/min (ref >60)
Glucose, Bld: 127 mg/dL — ABNORMAL HIGH (ref 70–99)
Potassium: 3.6 mmol/L (ref 3.5–5.1)
Sodium: 130 mmol/L — ABNORMAL LOW (ref 135–145)

## 2018-03-10 LAB — PHOSPHORUS: Phosphorus: 4.3 mg/dL (ref 2.5–4.6)

## 2018-03-10 LAB — ECHOCARDIOGRAM COMPLETE: Weight: 4124.8 oz

## 2018-03-10 LAB — MAGNESIUM: Magnesium: 1.5 mg/dL — ABNORMAL LOW (ref 1.7–2.4)

## 2018-03-10 MED ORDER — LORAZEPAM 2 MG/ML IJ SOLN
0.5000 mg | Freq: Once | INTRAMUSCULAR | Status: AC
  Administered 2018-03-10: 0.5 mg via INTRAVENOUS
  Filled 2018-03-10: qty 1

## 2018-03-10 MED ORDER — ASPIRIN 81 MG PO CHEW: 81.0000 mg | CHEWABLE_TABLET | Freq: Every day | ORAL | 0 refills | Status: DC

## 2018-03-10 MED ORDER — LORAZEPAM 2 MG/ML IJ SOLN
0.0000 mg | Freq: Two times a day (BID) | INTRAMUSCULAR | Status: DC
  Administered 2018-03-10: 1 mg via INTRAVENOUS
  Filled 2018-03-10: qty 1

## 2018-03-10 MED ORDER — LORAZEPAM 2 MG PO TABS: 0.0000 mg | ORAL_TABLET | Freq: Two times a day (BID) | ORAL | Status: DC

## 2018-03-10 NOTE — Progress Notes (Signed)
Pt is having some anxiety at this time. Talked to the NP [facility] and ordered Lorazepam 0.5 mg IV once. Will continue to monitor.

## 2018-03-10 NOTE — Discharge Summary (Signed)
[facility] NAME: Peter [name]    MR#:  [id number]  DATE OF BIRTH:  November 15, 1955  DATE OF ADMISSION:  03/08/2018   ADMITTING PHYSICIAN: Fritzi Mandes, MD  DATE OF DISCHARGE: 03/10/2018 12:13 PM  PRIMARY CARE PHYSICIAN: [name], MD   ADMISSION DIAGNOSIS:  Alcohol withdrawal syndrome with complication ([place]) [I78.676] Chest pain, unspecified type [R07.9] DISCHARGE DIAGNOSIS:  Active Problems:   Alcohol withdrawal ([place])  SECONDARY DIAGNOSIS:   Past Medical History:  Diagnosis Date  . A-fib ([place])   . Abnormality of gait 09/03/2012  . Alcohol abuse   . Anxiety   . Arthritis   . Arthritis   . Esophageal stricture   . GERD (gastroesophageal reflux disease)   . Gout   . Hyperlipidemia   . Hypertension   . Internal hemorrhoids   . Lesion of ulnar nerve 09/03/2012   Right ulnar neuropathy  . Murmur, cardiac   . PAF (paroxysmal atrial fibrillation) ([place])   . Palpitations   . Prostate cancer ([place]) 2016   treated with radioactive seed implant  . Seizures ([place])    pt states he had seizure 12/14  . Tubular adenoma of colon 03/2008   HOSPITAL COURSE:   GregoryThomasis a63 y.o.malewith a known history of atrial fibrillation on eliquis, alcohol abuse, chronic anxiety, arthritis, chronic back pain follows at the pain clinic in [place] who came to the emergency room after he decided to go cold [place] with drinking alcohol admitted for alcohol withdrawal with anxiety.   Per RN patient was very eager to leave and go home/AMA prior to evaluation for discharge today but stayed.   Plan   1.Alcohol withdrawal with anxiety - admitted to telemetry - continued alcohol withdrawal inpatient protocol/CIWA. Patient reported to have become very agitated and confused two nights ago. Given Ativan. This morning resting comfortably, no overnight events or agitation. Provided counseling and education on alcohol withdrawal  syndrome, alcohol use disorder and cessation, discussed outpatient substance abuse counseling, offered to coordinate AA with SW/CM. Patient declined repeatedly. States he is not an alcoholic and can control his drinking, quitting whenever he wants. Overall in the pre-contemplation stage of change. Encouraged to follow-up with PCP within 5 days for ongoing management and overall hospital follow-up.   2.Chest tightness with elevated troponin appeared to be demand ischemia in the setting of hypertension and alcohol withdrawal. In the setting of coronary artery disease with stable angina. Maximum troponin of 0.18.  Patient seen by cardiologist/Dr. Elenora Gamma, appreciate input. Felt to be secondary to demand ischemia. Patient declined ischemic work-up at this time. Prior stress test several years ago.  Continue aspirin and beta-blocker.   Patient is reportedly intolerant to statins and is on Praluent at home. Continue.  Follow up in 1-2 weeks with Dr. Donivan Scull group outpatient, ischemic workup to be re-considered. Patient already on anticoagulation with Eliquis as such heparin drip not initiated since unable to exclude esophageal varices or liver cirrhosis given history of chronic alcohol abuse.  Cardiologist recommended conservative management without heparin.  3.Chronic a fib/flutter on anticoagulation with Eliquis Continue rate control with beta-blocker and anticoagulation with Eliquis   4.Malignant hypertension suspected due to alcohol withdrawal Blood pressure better controlled this morning. - continue home meds - IV PRN hydralazine while admitted   5.Chronic back pain continue tramadol PRN. He was self medicating for pain with alcohol per chart review. - patient follows with pain clinic in [facility] Dr. Ernestina Patches  6.  Thrombocytopenia Most likely related to chronic alcohol abuse. Monitor with PCP ongoing.  7. Hyponatremia 130 on day of discharge, patient eager to leave.  Follow-up BMET with PCP and encouraged oral intake.   DVT prophylaxis already on eliquis  DISCHARGE CONDITIONS:  stable CONSULTS OBTAINED:  Treatment Team:  Otila Back, MD DRUG ALLERGIES:   Allergies  Allergen Reactions  . Hydrocodone Bitartrate Er Other (See Comments)    Makes him "feel funny"  . Clonazepam     Syncope  . Crestor [Rosuvastatin] Other (See Comments)    Bloating, swelling of legs, fatty liver  . Dust Mite Extract   . Gabapentin     Leg twitching at night, ineffective   . Lipitor [Atorvastatin] Other (See Comments)    Myalgias  . Lisinopril Swelling  . Topamax [Topiramate]     Cognitive clouding   DISCHARGE MEDICATIONS:   Allergies as of 03/10/2018      Reactions   Hydrocodone Bitartrate Er Other (See Comments)   Makes him "feel funny"   Clonazepam    Syncope   Crestor [rosuvastatin] Other (See Comments)   Bloating, swelling of legs, fatty liver   Dust Mite Extract    Gabapentin    Leg twitching at night, ineffective    Lipitor [atorvastatin] Other (See Comments)   Myalgias   Lisinopril Swelling   Topamax [topiramate]    Cognitive clouding      Medication List    TAKE these medications   albuterol 108 (90 Base) MCG/ACT inhaler Commonly known as:  PROVENTIL HFA;VENTOLIN HFA Inhale 2 puffs into the lungs every 6 (six) hours as needed for wheezing or shortness of breath.   Alirocumab 150 MG/ML Soaj Commonly known as:  PRALUENT Inject 1 pen into the skin every 14 (fourteen) days.   apixaban 5 MG Tabs tablet Commonly known as:  ELIQUIS Take 5 mg by mouth 2 (two) times daily.   aspirin 81 MG chewable tablet Chew 1 tablet (81 mg total) by mouth daily. Start taking on:  March 11, 2018   B-complex with vitamin C tablet Take 1 tablet by mouth daily.   busPIRone 10 MG tablet Commonly known as:  BUSPAR TAKE ONE TABLET BY MOUTH THREE TIMES A DAY   doxazosin 8 MG tablet Commonly known as:  CARDURA Take 1 tablet (8 mg total) by mouth 2 (two)  times daily.   doxycycline 100 MG tablet Commonly known as:  VIBRA-TABS Take 1 tablet (100 mg total) by mouth 2 (two) times daily. X 10 days with food   FOLIC ACID PO Take by mouth daily.   hydrochlorothiazide 12.5 MG tablet Commonly known as:  HYDRODIURIL Take 1 tablet (12.5 mg total) by mouth daily. In the am for blood pressure   Melatonin 10 MG Tabs Take 1 tablet by mouth at bedtime.   milk thistle 175 MG tablet Take 175 mg by mouth daily.   multivitamin tablet Take 1 tablet by mouth daily. Centrum Plus -Take 1 daily   nebivolol 10 MG tablet Commonly known as:  BYSTOLIC Take 10 mg by mouth daily.   nitroGLYCERIN 0.4 MG SL tablet Commonly known as:  NITROSTAT Place 1 tablet (0.4 mg total) under the tongue every 5 (five) minutes as needed for chest pain.   traMADol 50 MG tablet Commonly known as:  ULTRAM TAKE ONE TABLET BY MOUTH EVERY 12 HOURS AS NEEDED FOR MODERATE PAIN   triamcinolone cream 0.1 % Commonly known as:  KENALOG Apply 1 application topically daily as  needed.   VOLTAREN 1 % Gel Generic drug:  diclofenac sodium Apply 2 g topically as needed (knees).       DISCHARGE INSTRUCTIONS:   DIET:  Cardiac diet DISCHARGE CONDITION:  Stable ACTIVITY:  Activity as tolerated OXYGEN:  Home Oxygen: No.  Oxygen Delivery: room air DISCHARGE LOCATION:  home   If you experience worsening of your admission symptoms, develop shortness of breath, life threatening emergency, suicidal or homicidal thoughts you must seek medical attention immediately by calling 911 or calling your MD immediately if your symptoms are severe.  You Must read complete instructions/literature along with all the possible adverse reactions/side effects for all the medicines you take and that have been prescribed to you. Take any new medicines only after you have completely understood and accept all the possible adverse reactions/side effects.   Please note  You were cared for by a  hospitalist during your hospital stay. If you have any questions about your discharge medications or the care you received while you were in the hospital after you are discharged, you can call the unit and asked to speak with the hospitalist on call if the hospitalist that took care of you is not available. Once you are discharged, your primary care physician will handle any further medical issues. Please note that NO REFILLS for any discharge medications will be authorized once you are discharged, as it is imperative that you return to your primary care physician (or establish a relationship with a primary care physician if you do not have one) for your aftercare needs so that they can reassess your need for medications and monitor your lab values.    On the day of Discharge:  VITAL SIGNS:  Blood pressure (!) 112/54, pulse 66, temperature 98.4 F (36.9 C), temperature source Oral, resp. rate 18, height 6\' 1"  (1.854 m), weight 116.9 kg, SpO2 93 %. PHYSICAL EXAMINATION:  GENERAL:  63 y.o.-year-old patient lying in the bed with no acute distress.  EYES: Pupils equal, round, reactive to light and accommodation. No scleral icterus. Extraocular muscles intact.  HEENT: Head atraumatic, normocephalic. Oropharynx and nasopharynx clear.  NECK:  Supple, no jugular venous distention. No thyroid enlargement, no tenderness.  LUNGS: Normal breath sounds bilaterally, no wheezing, rales,rhonchi or crepitation. No use of accessory muscles of respiration.  CARDIOVASCULAR: S1, S2 normal. No murmurs, rubs, or gallops.  ABDOMEN: Soft, non-tender, non-distended. Bowel sounds present. No organomegaly or mass.  EXTREMITIES: No pedal edema, cyanosis, or clubbing.  NEUROLOGIC: Cranial nerves II through XII are intact. Muscle strength 5/5 in all extremities. Sensation intact. Gait not checked.  PSYCHIATRIC: The patient is alert and oriented x 3.  SKIN: No obvious rash, lesion, or ulcer.  DATA REVIEW:   CBC Recent Labs    Lab 03/10/18 0606  WBC 7.7  HGB 12.3*  HCT 34.2*  PLT 67*    Chemistries  Recent Labs  Lab 03/07/18 2252  03/10/18 0606  NA 134*   < > 130*  K 3.7   < > 3.6  CL 95*   < > 95*  CO2 26   < > 24  GLUCOSE 172*   < > 127*  BUN 13   < > 32*  CREATININE 0.76   < > 0.94  CALCIUM 9.5   < > 9.2  MG  --    < > 1.5*  AST 97*  --   --   ALT 40  --   --   ALKPHOS 92  --   --  BILITOT 3.9*  --   --    < > = values in this interval not displayed.     Microbiology Results  Results for orders placed or performed in visit on 10/31/17  Upper Respiratory Culture     Status: None   Collection Time: 10/31/17 11:55 AM  Result Value Ref Range Status   MICRO NUMBER: [id number]  Final   SPECIMEN QUALITY: ADEQUATE  Final   Source NOT GIVEN  Final   STATUS: FINAL  Final   Result: No oropharyngeal pathogens recovered.  Final    RADIOLOGY:  No results found.   Management plans discussed with the patient and/or family and they are in agreement.  CODE STATUS: Full Code   TOTAL TIME TAKING CARE OF THIS PATIENT: 45 minutes.    Ripley Fraise PA-C on 03/10/2018 at 3:48 PM  Between 7am to 6pm - Pager - [phone]  After 6pm go to [email or web address] - [employer] [facility]  Office  [phone]  CC: Primary care physician; [name], MD

## 2018-03-10 NOTE — Plan of Care (Signed)

## 2018-03-10 NOTE — Progress Notes (Signed)
[facility] to be D/C'd Home  per MD order.  Discussed prescriptions and follow up appointments with the patient. Prescriptions were e-prescribed, medication list explained in detail. Pt verbalized understanding.  Allergies as of 03/10/2018      Reactions   Hydrocodone Bitartrate Er Other (See Comments)   Makes him "feel funny"   Clonazepam    Syncope   Crestor [rosuvastatin] Other (See Comments)   Bloating, swelling of legs, fatty liver   Dust Mite Extract    Gabapentin    Leg twitching at night, ineffective    Lipitor [atorvastatin] Other (See Comments)   Myalgias   Lisinopril Swelling   Topamax [topiramate]    Cognitive clouding      Medication List    TAKE these medications   albuterol 108 (90 Base) MCG/ACT inhaler Commonly known as:  PROVENTIL HFA;VENTOLIN HFA Inhale 2 puffs into the lungs every 6 (six) hours as needed for wheezing or shortness of breath.   Alirocumab 150 MG/ML Soaj Commonly known as:  PRALUENT Inject 1 pen into the skin every 14 (fourteen) days.   apixaban 5 MG Tabs tablet Commonly known as:  ELIQUIS Take 5 mg by mouth 2 (two) times daily.   aspirin 81 MG chewable tablet Chew 1 tablet (81 mg total) by mouth daily. Start taking on:  March 11, 2018   B-complex with vitamin C tablet Take 1 tablet by mouth daily.   busPIRone 10 MG tablet Commonly known as:  BUSPAR TAKE ONE TABLET BY MOUTH THREE TIMES A DAY   doxazosin 8 MG tablet Commonly known as:  CARDURA Take 1 tablet (8 mg total) by mouth 2 (two) times daily.   doxycycline 100 MG tablet Commonly known as:  VIBRA-TABS Take 1 tablet (100 mg total) by mouth 2 (two) times daily. X 10 days with food   FOLIC ACID PO Take by mouth daily.   hydrochlorothiazide 12.5 MG tablet Commonly known as:  HYDRODIURIL Take 1 tablet (12.5 mg total) by mouth daily. In the am for blood pressure   Melatonin 10 MG Tabs Take 1 tablet by mouth at bedtime.   milk thistle 175 MG tablet Take 175 mg by  mouth daily.   multivitamin tablet Take 1 tablet by mouth daily. Centrum Plus -Take 1 daily   nebivolol 10 MG tablet Commonly known as:  BYSTOLIC Take 10 mg by mouth daily.   nitroGLYCERIN 0.4 MG SL tablet Commonly known as:  NITROSTAT Place 1 tablet (0.4 mg total) under the tongue every 5 (five) minutes as needed for chest pain.   traMADol 50 MG tablet Commonly known as:  ULTRAM TAKE ONE TABLET BY MOUTH EVERY 12 HOURS AS NEEDED FOR MODERATE PAIN   triamcinolone cream 0.1 % Commonly known as:  KENALOG Apply 1 application topically daily as needed.   VOLTAREN 1 % Gel Generic drug:  diclofenac sodium Apply 2 g topically as needed (knees).       Vitals:   03/10/18 0812 03/10/18 1014  BP: (!) 120/52 (!) 112/54  Pulse: 69 66  Resp: 16 18  Temp: 98.4 F (36.9 C)   SpO2: 94% 93%    Tele box removed and returned. Skin clean, dry and intact without evidence of skin break down, no evidence of skin tears noted. IV catheter discontinued intact. Site without signs and symptoms of complications. Dressing and pressure applied. Pt denies pain at this time. No complaints noted.  An After Visit Summary was printed and given to the patient. Patient escorted  via WC, and D/C home via private auto.  Rolley Sims

## 2018-03-10 NOTE — Discharge Instructions (Signed)
Start taking aspirin 81 mg daily and follow-up within 1-2 weeks with your cardiologist Dr. Rockey Situ.  Follow-up with your primary doctor within 5 days. We will make an appointment for you.   Continue to discuss managing your alcohol use and strategies for support in alcohol cessation with your primary doctor. At our discussion on discharge you were offered services for help with drinking and declined. We discussed how drinking then quitting "cold [place]" puts you at risk for withdrawal syndrome.

## 2018-03-11 ENCOUNTER — Encounter: Payer: Self-pay | Admitting: Internal Medicine

## 2018-03-11 NOTE — Progress Notes (Signed)
Chief Complaint  Patient presents with  . Headache  . Sinusitis   Acute visit  1. H/o frontal, temporal, cheeks and ears and neck pain with imaging scan prior mild degenerative changes in neck. He has h/o allergies and gets allergy shots. He is getting white mucous out of nose. He is taking Advil and Tylenol  2. HTN uncontrolled home BP meter reads 111-170/70s at home he has not taken cardura 8 mg bid nor 12.5 mg hctz as he states when he takes medication at times he feels dizzy/off balance and did not take it today due to having to come here but BP has been improved since on hctz. He has not been taking bystolic though this was rec by cardiology 10 mg qd he reports episodes of dizziness and low blood pressure  He drinks 4 beers daily 3. Chronic back pain taking Tramadol 50 mg tid more than Rx  4. Carotid US CAS b/l 60-79%   Review of Systems  Constitutional: Negative for weight loss.  HENT: Positive for congestion and sinus pain. Negative for hearing loss.   Eyes: Negative for blurred vision.  Respiratory: Negative for sputum production.   Cardiovascular: Negative for chest pain.  Gastrointestinal: Negative for abdominal pain.  Genitourinary: Negative.   Musculoskeletal: Positive for back pain and neck pain.  Skin: Negative for rash.  Neurological: Positive for headaches.  Psychiatric/Behavioral: Negative for depression.       Drinking 4 beers daily     Past Medical History:  Diagnosis Date  . A-fib ([place])   . Abnormality of gait 09/03/2012  . Alcohol abuse   . Anxiety   . Arthritis   . Arthritis   . Esophageal stricture   . GERD (gastroesophageal reflux disease)   . Gout   . Hyperlipidemia   . Hypertension   . Internal hemorrhoids   . Lesion of ulnar nerve 09/03/2012   Right ulnar neuropathy  . Murmur, cardiac   . PAF (paroxysmal atrial fibrillation) ([place])   . Palpitations   . Prostate cancer ([place]) 2016   treated with radioactive seed implant  . Seizures ([place])    pt  states he had seizure 12/14  . Tubular adenoma of colon 03/2008   Past Surgical History:  Procedure Laterality Date  . CATARACT EXTRACTION W/PHACO Right 07/03/2014   Procedure: CATARACT EXTRACTION PHACO AND INTRAOCULAR LENS PLACEMENT (IOC);  Surgeon: Lyla Glassing, MD;  Location: [facility] ORS;  Service: Ophthalmology;  Laterality: Right;  lot # [id number] H [place]: 00:32.3 AP% 9.1 CDE: 2.92  . CIRCUMCISION    . COLONOSCOPY    . ELBOW SURGERY    . ELECTROPHYSIOLOGIC STUDY N/A 11/02/2015   Procedure: CARDIOVERSION;  Surgeon: Minna Merritts, MD;  Location: [facility] ORS;  Service: Cardiovascular;  Laterality: N/A;  . ESOPHAGOGASTRODUODENOSCOPY (EGD) WITH PROPOFOL N/A 05/17/2016   Procedure: ESOPHAGOGASTRODUODENOSCOPY (EGD) WITH PROPOFOL;  Surgeon: Lucilla Lame, MD;  Location: [facility] ENDOSCOPY;  Service: Endoscopy;  Laterality: N/A;  . EYE SURGERY    . HAND SURGERY Left   . SHOULDER ARTHROSCOPY Left   . SHOULDER SURGERY     Family History  Problem Relation Age of Onset  . Diabetes Father   . Kidney disease Father   . Lung cancer Father        smoker  . Hyperlipidemia Father   . Hypertension Father   . Lung cancer Mother        smoke  . Hypertension Mother   . Colon cancer Neg Hx   . Pancreatic  cancer Neg Hx   . Stomach cancer Neg Hx    Social History   Socioeconomic History  . Marital status: Married    Spouse name: Not on file  . Number of children: 0  . Years of education: hs  . Highest education level: Not on file  Occupational History  . Occupation: Retired    Fish farm manager: Economist  . Occupation: UTILITIES OPER.    Employer: [employer]  Social Needs  . Financial resource strain: Not on file  . Food insecurity:    Worry: Not on file    Inability: Not on file  . Transportation needs:    Medical: Not on file    Non-medical: Not on file  Tobacco Use  . Smoking status: Former Smoker    Packs/day: 0.10    Years: 24.00    Pack years: 2.40    Types: Cigarettes  . Smokeless  tobacco: Never Used  . Tobacco comment: down to 4-5  ciggs a day.  Substance and Sexual Activity  . Alcohol use: Yes    Alcohol/week: 2.0 standard drinks    Types: 2 Cans of beer per week    Frequency: Never    Comment: 4 beers daily   . Drug use: No    Types: Marijuana    Comment: 1970's occ.   Marland Kitchen Sexual activity: Not on file  Lifestyle  . Physical activity:    Days per week: Not on file    Minutes per session: Not on file  . Stress: Not on file  Relationships  . Social connections:    Talks on phone: Not on file    Gets together: Not on file    Attends religious service: Not on file    Active member of club or organization: Not on file    Attends meetings of clubs or organizations: Not on file    Relationship status: Not on file  . Intimate partner violence:    Fear of current or ex partner: Not on file    Emotionally abused: Not on file    Physically abused: Not on file    Forced sexual activity: Not on file  Other Topics Concern  . Not on file  Social History Narrative   Lives with wife   Caffeine use: Drinks one cup coffee per morning   No soda or tea   Current Meds  Medication Sig  . albuterol (PROVENTIL HFA;VENTOLIN HFA) 108 (90 Base) MCG/ACT inhaler Inhale 2 puffs into the lungs every 6 (six) hours as needed for wheezing or shortness of breath.  . Alirocumab (PRALUENT) 150 MG/ML SOAJ Inject 1 pen into the skin every 14 (fourteen) days.  Marland Kitchen apixaban (ELIQUIS) 5 MG TABS tablet Take 5 mg by mouth 2 (two) times daily.  . B Complex-C (B-COMPLEX WITH VITAMIN C) tablet Take 1 tablet by mouth daily.  . busPIRone (BUSPAR) 10 MG tablet TAKE ONE TABLET BY MOUTH THREE TIMES A DAY  . doxazosin (CARDURA) 8 MG tablet Take 1 tablet (8 mg total) by mouth 2 (two) times daily.  Marland Kitchen FOLIC ACID PO Take by mouth daily.  . hydrochlorothiazide (HYDRODIURIL) 12.5 MG tablet Take 1 tablet (12.5 mg total) by mouth daily. In the am for blood pressure  . Melatonin 10 MG TABS Take 1 tablet by  mouth at bedtime.  . milk thistle 175 MG tablet Take 175 mg by mouth daily.  . Multiple Vitamin (MULTIVITAMIN) tablet Take 1 tablet by mouth daily. Centrum Plus -Take 1 daily  .  nebivolol (BYSTOLIC) 10 MG tablet Take 10 mg by mouth daily.  . nitroGLYCERIN (NITROSTAT) 0.4 MG SL tablet Place 1 tablet (0.4 mg total) under the tongue every 5 (five) minutes as needed for chest pain.  . traMADol (ULTRAM) 50 MG tablet TAKE ONE TABLET BY MOUTH EVERY 12 HOURS AS NEEDED FOR MODERATE PAIN  . triamcinolone cream (KENALOG) 0.1 % Apply 1 application topically daily as needed.   . VOLTAREN 1 % GEL Apply 2 g topically as needed (knees).   . [DISCONTINUED] Magnesium 500 MG CAPS Take 1 capsule (500 mg total) by mouth 2 (two) times daily at 8 am and 10 pm.  . [DISCONTINUED] triazolam (HALCION) 0.25 MG tablet Take 1 tab by mouth 1 hour prior to procedure with very light food, do not drive motor vehicle.   Current Facility-Administered Medications for the 03/06/18 encounter (Office Visit) with McLean-Scocuzza, Nino Glow, MD  Medication  . methylPREDNISolone acetate (DEPO-MEDROL) injection 80 mg   Allergies  Allergen Reactions  . Hydrocodone Bitartrate Er Other (See Comments)    Makes him "feel funny"  . Clonazepam     Syncope  . Crestor [Rosuvastatin] Other (See Comments)    Bloating, swelling of legs, fatty liver  . Dust Mite Extract   . Gabapentin     Leg twitching at night, ineffective   . Lipitor [Atorvastatin] Other (See Comments)    Myalgias  . Lisinopril Swelling  . Topamax [Topiramate]     Cognitive clouding   Recent Results (from the past 2160 hour(s))  CBC     Status: Abnormal   Collection Time: 01/04/18  2:05 PM  Result Value Ref Range   WBC 6.9 3.4 - 10.8 x10E3/uL   RBC 3.18 (L) 4.14 - 5.80 x10E6/uL   Hemoglobin 12.5 (L) 13.0 - 17.7 g/dL   Hematocrit 33.4 (L) 37.5 - 51.0 %   MCV 105 (H) 79 - 97 fL   MCH 39.3 (H) 26.6 - 33.0 pg   MCHC 37.4 (H) 31.5 - 35.7 g/dL   RDW 13.1 12.3 - 15.4 %     Comment: **Effective January 15, 2018, the RDW pediatric reference**   interval will be removed and the adult reference interval   will be changing to:                             Male 11.7 - 15.4                                                      Male 11.6 - 15.4    Platelets 68 (LL) 150 - 450 x10E3/uL   Hematology Comments: Note:     Comment: Verified by microscopic examination.  Comp Met (CMET)     Status: Abnormal   Collection Time: 01/25/18  1:40 PM  Result Value Ref Range   Glucose 136 (H) 65 - 99 mg/dL   BUN 15 8 - 27 mg/dL   Creatinine, Ser 0.89 0.76 - 1.27 mg/dL   GFR calc non Af Amer 92 >59 mL/min/1.73   GFR calc Af Amer 107 >59 mL/min/1.73   BUN/Creatinine Ratio 17 10 - 24   Sodium 136 134 - 144 mmol/L   Potassium 3.8 3.5 - 5.2 mmol/L   Chloride 95 (L) 96 - 106 mmol/L   CO2  23 20 - 29 mmol/L   Calcium 9.9 8.6 - 10.2 mg/dL   Total Protein 6.9 6.0 - 8.5 g/dL   Albumin 3.6 3.6 - 4.8 g/dL    Comment:     **Effective January 29, 2018 Albumin reference**       interval will be changing to:              Age                Male          Male           0 -  7 days        3.6 - 4.9      3.6 - 4.9           8 - 30 days        3.4 - 4.7      3.4 - 4.7           1 -  6 month       3.7 - 4.8      3.7 - 4.8    7 months -  2 years       3.9 - 5.0      3.9 - 5.0           3 -  5 years       4.0 - 5.0      4.0 - 5.0           6 - 12 years       4.1 - 5.0      4.0 - 5.0          13 - 30 years       4.1 - 5.2      3.9 - 5.0          31 - 50 years       4.0 - 5.0      3.8 - 4.8          51 - 60 years       3.8 - 4.9      3.8 - 4.9          61 - 70 years       3.8 - 4.8      3.8 - 4.8          71 - 80 years       3.7 - 4.7      3.7 - 4.7          81 - 89 years       3.6 - 4.6      3.6 - 4.6              >89 years       3.5 - 4.6      3.5 - 4.6    Globulin, Total 3.3 1.5 - 4.5 g/dL   Albumin/Globulin Ratio 1.1 (L) 1.2 - 2.2   Bilirubin Total 2.3 (H) 0.0 - 1.2 mg/dL   Alkaline  Phosphatase 102 39 - 117 IU/L   AST 80 (H) 0 - 40 IU/L   ALT 33 0 - 44 IU/L  TSH     Status: None   Collection Time: 01/25/18  1:42 PM  Result Value Ref Range   TSH 0.980 0.450 - 4.500 uIU/mL  Basic metabolic panel     Status: Abnormal   Collection Time: 01/25/18  1:42 PM  Result Value Ref  Range   Glucose 139 (H) 65 - 99 mg/dL   BUN 15 8 - 27 mg/dL   Creatinine, Ser 0.82 0.76 - 1.27 mg/dL   GFR calc non Af Amer 95 >59 mL/min/1.73   GFR calc Af Amer 110 >59 mL/min/1.73   BUN/Creatinine Ratio 18 10 - 24   Sodium 136 134 - 144 mmol/L   Potassium 3.8 3.5 - 5.2 mmol/L   Chloride 96 96 - 106 mmol/L   CO2 23 20 - 29 mmol/L   Calcium 9.8 8.6 - 10.2 mg/dL  B12     Status: Abnormal   Collection Time: 01/25/18  1:43 PM  Result Value Ref Range   Vitamin B-12 >2000 (H) 232 - 1245 pg/mL  CBC with Differential     Status: Abnormal   Collection Time: 03/07/18 10:52 PM  Result Value Ref Range   WBC 7.2 4.0 - 10.5 K/uL   RBC 3.38 (L) 4.22 - 5.81 MIL/uL   Hemoglobin 13.4 13.0 - 17.0 g/dL   HCT 36.2 (L) 39.0 - 52.0 %   MCV 107.1 (H) 80.0 - 100.0 fL   MCH 39.6 (H) 26.0 - 34.0 pg   MCHC 37.0 (H) 30.0 - 36.0 g/dL   RDW 13.3 11.5 - 15.5 %   Platelets 66 (L) 150 - 400 K/uL    Comment: SPECIMEN CHECKED FOR CLOTS Immature Platelet Fraction may be clinically indicated, consider ordering this additional test [id number]    nRBC 0.0 0.0 - 0.2 %   Neutrophils Relative % 75 %   Neutro Abs 5.4 1.7 - 7.7 K/uL   Lymphocytes Relative 13 %   Lymphs Abs 1.0 0.7 - 4.0 K/uL   Monocytes Relative 11 %   Monocytes Absolute 0.8 0.1 - 1.0 K/uL   Eosinophils Relative 0 %   Eosinophils Absolute 0.0 0.0 - 0.5 K/uL   Basophils Relative 1 %   Basophils Absolute 0.1 0.0 - 0.1 K/uL   Immature Granulocytes 0 %   Abs Immature Granulocytes 0.02 0.00 - 0.07 K/uL    Comment: Performed at [facility], [address] [place], [place] [address]  Comprehensive metabolic panel     Status: Abnormal   Collection  Time: 03/07/18 10:52 PM  Result Value Ref Range   Sodium 134 (L) 135 - 145 mmol/L   Potassium 3.7 3.5 - 5.1 mmol/L   Chloride 95 (L) 98 - 111 mmol/L   CO2 26 22 - 32 mmol/L   Glucose, Bld 172 (H) 70 - 99 mg/dL   BUN 13 8 - 23 mg/dL   Creatinine, Ser 0.76 0.61 - 1.24 mg/dL   Calcium 9.5 8.9 - 10.3 mg/dL   Total Protein 7.6 6.5 - 8.1 g/dL   Albumin 3.6 3.5 - 5.0 g/dL   AST 97 (H) 15 - 41 U/L   ALT 40 0 - 44 U/L   Alkaline Phosphatase 92 38 - 126 U/L   Total Bilirubin 3.9 (H) 0.3 - 1.2 mg/dL   GFR calc non Af Amer >60 >60 mL/min   GFR calc Af Amer >60 >60 mL/min   Anion gap 13 5 - 15    Comment: Performed at [facility], [address] [place], [place] [address]  Troponin I - ONCE - STAT     Status: Abnormal   Collection Time: 03/07/18 10:52 PM  Result Value Ref Range   Troponin I 0.10 (HH) <0.03 ng/mL    Comment: CRITICAL RESULT CALLED TO, READ BACK BY AND VERIFIED WITH JENNA Buford Dresser  AT 0003 03/08/2018 SMA Performed at [facility], [address] [place], [place] [address]   Troponin I - ONCE - STAT     Status: Abnormal   Collection Time: 03/08/18  2:19 AM  Result Value Ref Range   Troponin I 0.11 (HH) <0.03 ng/mL    Comment: CRITICAL VALUE NOTED. VALUE IS CONSISTENT WITH PREVIOUSLY REPORTED/CALLED VALUE SMA Performed at [facility], [address] [place], [place] [address]   Troponin I - Now Then Q6H     Status: Abnormal   Collection Time: 03/08/18  9:57 AM  Result Value Ref Range   Troponin I 0.18 (HH) <0.03 ng/mL    Comment: CRITICAL VALUE NOTED. VALUE IS CONSISTENT WITH PREVIOUSLY REPORTED/CALLED VALUE FMW Performed at [facility], [address] [place], [place] [address]   Troponin I - Now Then Q6H     Status: Abnormal   Collection Time: 03/08/18  4:05 PM  Result Value Ref Range   Troponin I 0.16 (HH) <0.03 ng/mL    Comment: CRITICAL VALUE NOTED. VALUE IS CONSISTENT WITH PREVIOUSLY REPORTED/CALLED VALUE/JUW Performed  at [facility], [address] [place], [place] [address]   Troponin I - Now Then Q6H     Status: Abnormal   Collection Time: 03/08/18  9:27 PM  Result Value Ref Range   Troponin I 0.21 (HH) <0.03 ng/mL    Comment: CRITICAL VALUE NOTED. VALUE IS CONSISTENT WITH PREVIOUSLY REPORTED/CALLED VALUE SMA Performed at [facility], [address] [place], [place] [address]   Troponin I - Now Then Q6H     Status: Abnormal   Collection Time: 03/09/18  3:52 AM  Result Value Ref Range   Troponin I 0.26 (HH) <0.03 ng/mL    Comment: CRITICAL VALUE NOTED. VALUE IS CONSISTENT WITH PREVIOUSLY REPORTED/CALLED VALUE SMA Performed at [facility], [address] [place], [place] [address]   Basic metabolic panel     Status: Abnormal   Collection Time: 03/09/18  3:52 AM  Result Value Ref Range   Sodium 132 (L) 135 - 145 mmol/L   Potassium 3.3 (L) 3.5 - 5.1 mmol/L   Chloride 94 (L) 98 - 111 mmol/L   CO2 25 22 - 32 mmol/L   Glucose, Bld 127 (H) 70 - 99 mg/dL   BUN 23 8 - 23 mg/dL   Creatinine, Ser 0.87 0.61 - 1.24 mg/dL   Calcium 9.3 8.9 - 10.3 mg/dL   GFR calc non Af Amer >60 >60 mL/min   GFR calc Af Amer >60 >60 mL/min   Anion gap 13 5 - 15    Comment: Performed at [facility], [address] [place], [place] [address]  CBC     Status: Abnormal   Collection Time: 03/09/18  3:52 AM  Result Value Ref Range   WBC 8.8 4.0 - 10.5 K/uL   RBC 3.30 (L) 4.22 - 5.81 MIL/uL   Hemoglobin 12.8 (L) 13.0 - 17.0 g/dL   HCT 35.2 (L) 39.0 - 52.0 %   MCV 106.7 (H) 80.0 - 100.0 fL   MCH 38.8 (H) 26.0 - 34.0 pg   MCHC 36.4 (H) 30.0 - 36.0 g/dL   RDW 13.7 11.5 - 15.5 %   Platelets 65 (L) 150 - 400 K/uL    Comment: SPECIMEN CHECKED FOR CLOTS Immature Platelet Fraction may be clinically indicated, consider ordering this additional test [id number] CONSISTENT WITH PREVIOUS RESULT    nRBC 0.0 0.0 - 0.2 %  Comment: Performed at [facility], [address] [place], [place] [address]  Magnesium     Status: Abnormal   Collection Time: 03/09/18  3:52 AM  Result Value Ref Range   Magnesium 1.3 (L) 1.7 - 2.4 mg/dL    Comment: Performed at [facility], [address] [place], [place] [address]  Troponin I - Now Then Q6H     Status: Abnormal   Collection Time: 03/09/18  9:28 AM  Result Value Ref Range   Troponin I 0.27 (HH) <0.03 ng/mL    Comment: CRITICAL VALUE NOTED. VALUE IS CONSISTENT WITH PREVIOUSLY REPORTED/CALLED VALUE  SDR Performed at [facility], [address] [place], [place] [address]   Troponin I - Now Then Q6H     Status: Abnormal   Collection Time: 03/09/18  3:41 PM  Result Value Ref Range   Troponin I 0.26 (HH) <0.03 ng/mL    Comment: CRITICAL VALUE NOTED. VALUE IS CONSISTENT WITH PREVIOUSLY REPORTED/CALLED VALUE.PMF Performed at [facility], [address] [place], [place] [address]   Troponin I - Now Then Q6H     Status: Abnormal   Collection Time: 03/09/18  9:35 PM  Result Value Ref Range   Troponin I 0.23 (HH) <0.03 ng/mL    Comment: CRITICAL VALUE NOTED. VALUE IS CONSISTENT WITH PREVIOUSLY REPORTED/CALLED VALUE / [place] Performed at [facility], [address] [place], [place] [address]   Basic metabolic panel     Status: Abnormal   Collection Time: 03/10/18  6:06 AM  Result Value Ref Range   Sodium 130 (L) 135 - 145 mmol/L   Potassium 3.6 3.5 - 5.1 mmol/L   Chloride 95 (L) 98 - 111 mmol/L   CO2 24 22 - 32 mmol/L   Glucose, Bld 127 (H) 70 - 99 mg/dL   BUN 32 (H) 8 - 23 mg/dL   Creatinine, Ser 0.94 0.61 - 1.24 mg/dL   Calcium 9.2 8.9 - 10.3 mg/dL   GFR calc non Af Amer >60 >60 mL/min   GFR calc Af Amer >60 >60 mL/min   Anion gap 11 5 - 15    Comment: Performed at [facility], [address] [place], [place] [address]  Magnesium     Status: Abnormal   Collection Time: 03/10/18  6:06 AM  Result Value Ref Range   Magnesium 1.5 (L) 1.7 - 2.4 mg/dL     Comment: Performed at [facility], [address] [place], [place] [address]  CBC     Status: Abnormal   Collection Time: 03/10/18  6:06 AM  Result Value Ref Range   WBC 7.7 4.0 - 10.5 K/uL   RBC 3.13 (L) 4.22 - 5.81 MIL/uL   Hemoglobin 12.3 (L) 13.0 - 17.0 g/dL   HCT 34.2 (L) 39.0 - 52.0 %   MCV 109.3 (H) 80.0 - 100.0 fL   MCH 39.3 (H) 26.0 - 34.0 pg   MCHC 36.0 30.0 - 36.0 g/dL   RDW 14.2 11.5 - 15.5 %   Platelets 67 (L) 150 - 400 K/uL    Comment: Immature Platelet Fraction may be clinically indicated, consider ordering this additional test [id number]    nRBC 0.0 0.0 - 0.2 %    Comment: Performed at [facility], [address] [place], [place] [address]  Phosphorus     Status: None   Collection Time: 03/10/18  6:06 AM  Result Value Ref Range   Phosphorus 4.3 2.5 - 4.6 mg/dL  Comment: Performed at [facility], [address] [place],  [address]  ECHOCARDIOGRAM COMPLETE     Status: None   Collection Time: 03/10/18 12:01 PM  Result Value Ref Range   Weight 4,124.8 oz   BP 112/44 mmHg   Objective  Body mass index is 34.78 kg/m. Wt Readings from Last 3 Encounters:  03/06/18 263 lb 9.6 oz (119.6 kg)  01/23/18 258 lb (117 kg)   Temp Readings from Last 3 Encounters:  03/06/18 97.6 F (36.4 C) (Oral)  02/20/18 98 F (36.7 C) (Oral)   BP Readings from Last 3 Encounters:  03/06/18 (!) 168/72  02/20/18 (!) 167/73   Pulse Readings from Last 3 Encounters:  03/06/18 82  02/20/18 74    Physical Exam Vitals signs and nursing note reviewed.  Constitutional:      Appearance: Normal appearance. He is well-developed and well-groomed. He is obese.  HENT:     Head: Normocephalic and atraumatic.     Nose:     Right Sinus: Maxillary sinus tenderness and frontal sinus tenderness present.     Left Sinus: Maxillary sinus tenderness and frontal sinus tenderness present.     Mouth/Throat:     Mouth: Mucous membranes are moist.      Pharynx: Oropharynx is clear.  Eyes:     Pupils: Pupils are equal, round, and reactive to light.  Cardiovascular:     Rate and Rhythm: Normal rate and regular rhythm.     Heart sounds: Murmur present.  Pulmonary:     Effort: Pulmonary effort is normal.     Breath sounds: Normal breath sounds.  Abdominal:     General: Bowel sounds are normal.  Skin:    General: Skin is warm and dry.  Neurological:     Mental Status: He is alert and oriented to person, place, and time.     Gait: Gait normal.     Comments: CN 2-12 grossly intact  No neuro deficits  Normal motor strength upper and lower ext b/l   Psychiatric:        Attention and Perception: Attention and perception normal.        Mood and Affect: Mood and affect normal.        Speech: Speech normal.        Behavior: Behavior normal. Behavior is cooperative.        Thought Content: Thought content normal.        Cognition and Memory: Cognition and memory normal.        Judgment: Judgment normal.     Assessment   1. Headache x 10 days could be 2/2 HTN uncontrolled, sinusitis 2. HTN uncontrolled, noncompliant with meds,HTN could be due to alcoholism drinking 4 beers daily ? More than this B/l CAS 60-79% 3. Chronic back pain and neck pain with mild degenerative changes Plan   1. Stat MRI Trial of doxycycline bid x 10 days for sinusitis  Prn Tylenol with Tramadol for chronic pain  2. Cont hctz 12.5 mg qd, cardura 8 mg bid, rec he resume bystolic 5 mg x 1 week then increase to dose he should be on 10 mg which is the dose he should be on per cards Control BP  Call Vascular Dr. Lucky Cowboy to f/u recs for CAS b/l they have been trying to schedule with him but he has not made appt  F/u cards   3. F/u PCP on Tramadol 50 mg bid though taking tid   Provider: Dr. Olivia Mackie McLean-Scocuzza-Internal  Medicine

## 2018-03-12 ENCOUNTER — Telehealth: Payer: Self-pay | Admitting: Family Medicine

## 2018-03-12 DIAGNOSIS — K140 Glossitis: Secondary | ICD-10-CM | POA: Diagnosis not present

## 2018-03-12 DIAGNOSIS — J340 Abscess, furuncle and carbuncle of nose: Secondary | ICD-10-CM | POA: Diagnosis not present

## 2018-03-12 DIAGNOSIS — R040 Epistaxis: Secondary | ICD-10-CM | POA: Diagnosis not present

## 2018-03-12 DIAGNOSIS — H6123 Impacted cerumen, bilateral: Secondary | ICD-10-CM | POA: Diagnosis not present

## 2018-03-12 NOTE — Procedures (Signed)
Lumbar Facet Joint Intra-Articular Injection(s) with Fluoroscopic Guidance  Patient: Peter [name]      Date of Birth: March 17, 1955 MRN: [id number] PCP: [name], MD      Visit Date: 02/20/2018   Universal Protocol:    Date/Time: 02/20/2018  Consent Given By: the patient  Position: PRONE   Additional Comments: Vital signs were monitored before and after the procedure. Patient was prepped and draped in the usual sterile fashion. The correct patient, procedure, and site was verified.   Injection Procedure Details:  Procedure Site One Meds Administered:  Meds ordered this encounter  Medications  . methylPREDNISolone acetate (DEPO-MEDROL) injection 80 mg     Laterality: Bilateral  Location/Site:  L4-L5  Needle size: 22 guage  Needle type: Spinal  Needle Placement: Articular  Findings:  -Comments: Excellent flow of contrast producing a partial arthrogram.  Procedure Details: The fluoroscope beam is vertically oriented in AP, and the inferior recess is visualized beneath the lower pole of the inferior apophyseal process, which represents the target point for needle insertion. When direct visualization is difficult the target point is located at the medial projection of the vertebral pedicle. The region overlying each aforementioned target is locally anesthetized with a 1 to 2 ml. volume of 1% Lidocaine without Epinephrine.   The spinal needle was inserted into each of the above mentioned facet joints using biplanar fluoroscopic guidance. A 0.25 to 0.5 ml. volume of Isovue-250 was injected and a partial facet joint arthrogram was obtained. A single spot film was obtained of the resulting arthrogram.    One to 1.25 ml of the steroid/anesthetic solution was then injected into each of the facet joints noted above.   Additional Comments:  The patient tolerated the procedure well Dressing: 2 x 2 sterile gauze and Band-Aid    Post-procedure details: Patient was  observed during the procedure. Post-procedure instructions were reviewed.  Patient left the clinic in stable condition.

## 2018-03-12 NOTE — Telephone Encounter (Signed)
Transition Care Management Follow-up Telephone Call  How have you been since you were released from the hospital? Patient stated he is feeling much better denies any chest pain since discharge.   Do you understand why you were in the hospital? yes   Do you understand the discharge instrcutions? yes  Items Reviewed:  Medications reviewed: yes  Allergies reviewed: yes  Dietary changes reviewed: yes  Referrals reviewed: yes   Functional Questionnaire:   Activities of Daily Living (ADLs):   He states they are independent in the following: ambulation, bathing and hygiene, feeding, continence, grooming, toileting and dressing States they require assistance with the following: No assistance needed.   Any transportation issues/concerns?: no   Any patient concerns? no   Confirmed importance and date/time of follow-up visits scheduled: yes   Confirmed with patient if condition begins to worsen call PCP or go to the ER.  Patient was given the Call-a-Nurse line [phone]: yes

## 2018-03-15 ENCOUNTER — Other Ambulatory Visit: Payer: Self-pay | Admitting: Neurology

## 2018-03-16 ENCOUNTER — Encounter (INDEPENDENT_AMBULATORY_CARE_PROVIDER_SITE_OTHER): Payer: 59 | Admitting: Vascular Surgery

## 2018-03-16 ENCOUNTER — Other Ambulatory Visit: Payer: Self-pay | Admitting: Cardiovascular Disease

## 2018-03-16 DIAGNOSIS — J301 Allergic rhinitis due to pollen: Secondary | ICD-10-CM | POA: Diagnosis not present

## 2018-03-16 MED ORDER — APIXABAN 5 MG PO TABS: 5.0000 mg | ORAL_TABLET | Freq: Two times a day (BID) | ORAL | 1 refills | Status: DC

## 2018-03-16 NOTE — Telephone Encounter (Signed)
63 yo, 116kg, Scr 03/10/18 0.94 Last OV 09/15/17  Refill sent as requested

## 2018-03-16 NOTE — Telephone Encounter (Signed)
°*  STAT* If patient is at the pharmacy, call can be transferred to refill team.   1. Which medications need to be refilled? (please list name of each medication and dose if known) Eliquis 5 MG 2 times daily   2. Which pharmacy/location (including street and city if local pharmacy) is medication to be sent to? [facility] [address]   3. Do they need a 30 day or 90 day supply? 90 day   Patient would like refill today

## 2018-03-16 NOTE — Telephone Encounter (Signed)
Please review for refill, Thanks !  

## 2018-03-20 ENCOUNTER — Telehealth: Payer: Self-pay

## 2018-03-20 ENCOUNTER — Ambulatory Visit (INDEPENDENT_AMBULATORY_CARE_PROVIDER_SITE_OTHER): Payer: Medicare Other | Admitting: Vascular Surgery

## 2018-03-20 ENCOUNTER — Encounter (INDEPENDENT_AMBULATORY_CARE_PROVIDER_SITE_OTHER): Payer: Self-pay | Admitting: Vascular Surgery

## 2018-03-20 ENCOUNTER — Ambulatory Visit: Payer: 59 | Admitting: Family Medicine

## 2018-03-20 ENCOUNTER — Other Ambulatory Visit: Payer: Self-pay | Admitting: Family Medicine

## 2018-03-20 ENCOUNTER — Other Ambulatory Visit: Payer: Self-pay

## 2018-03-20 ENCOUNTER — Encounter: Payer: Self-pay | Admitting: Family Medicine

## 2018-03-20 VITALS — BP 119/73 | HR 73 | Resp 12 | Ht 73.0 in | Wt 254.0 lb

## 2018-03-20 VITALS — BP 140/62 | HR 61 | Temp 98.4°F | Ht 73.0 in | Wt 254.8 lb

## 2018-03-20 DIAGNOSIS — G8929 Other chronic pain: Secondary | ICD-10-CM

## 2018-03-20 DIAGNOSIS — F10931 Alcohol use, unspecified with withdrawal delirium: Secondary | ICD-10-CM

## 2018-03-20 DIAGNOSIS — I6523 Occlusion and stenosis of bilateral carotid arteries: Secondary | ICD-10-CM

## 2018-03-20 DIAGNOSIS — E871 Hypo-osmolality and hyponatremia: Secondary | ICD-10-CM

## 2018-03-20 DIAGNOSIS — R079 Chest pain, unspecified: Secondary | ICD-10-CM | POA: Diagnosis not present

## 2018-03-20 DIAGNOSIS — F10231 Alcohol dependence with withdrawal delirium: Secondary | ICD-10-CM

## 2018-03-20 DIAGNOSIS — Z79899 Other long term (current) drug therapy: Secondary | ICD-10-CM | POA: Diagnosis not present

## 2018-03-20 DIAGNOSIS — E7800 Pure hypercholesterolemia, unspecified: Secondary | ICD-10-CM

## 2018-03-20 DIAGNOSIS — Z7901 Long term (current) use of anticoagulants: Secondary | ICD-10-CM | POA: Diagnosis not present

## 2018-03-20 DIAGNOSIS — I10 Essential (primary) hypertension: Secondary | ICD-10-CM

## 2018-03-20 DIAGNOSIS — D696 Thrombocytopenia, unspecified: Secondary | ICD-10-CM | POA: Diagnosis not present

## 2018-03-20 DIAGNOSIS — F17210 Nicotine dependence, cigarettes, uncomplicated: Secondary | ICD-10-CM

## 2018-03-20 DIAGNOSIS — M5442 Lumbago with sciatica, left side: Secondary | ICD-10-CM

## 2018-03-20 DIAGNOSIS — M5441 Lumbago with sciatica, right side: Secondary | ICD-10-CM

## 2018-03-20 NOTE — Patient Instructions (Signed)
Carotid Artery Disease  The carotid arteries are arteries on both sides of the neck. They carry blood to the brain, face, and neck. Carotid artery disease happens when these arteries become smaller (narrow) or get blocked. If these arteries become smaller or get blocked, you are more likely to have a stroke or a warning stroke (transient ischemic attack). Follow these instructions at home:  Take over-the-counter and prescription medicines only as told by your doctor.  Make sure you understand all instructions about your medicines. Do not stop taking your medicines without talking to your doctor first.  Follow your doctor's diet instructions. It is important to follow a healthy diet. ? Eat foods that include plenty of: ? Fresh fruits. ? Vegetables. ? Lean meats. ? Avoid these foods: ? Foods that are high in fat. ? Foods that are high in salt (sodium). ? Foods that are fried. ? Foods that are processed. ? Foods that have few good nutrients (poor nutritional value).  Keep a healthy weight.  Stay active. Get at least 30 minutes of activity every day.  Do not smoke.  Limit alcohol use to: ? No more than 2 drinks a day for men. ? No more than 1 drink a day for women who are not pregnant.  Do not use illegal drugs.  Keep all follow-up visits as told by your doctor. This is important. Contact a doctor if: Get help right away if:  You have any symptoms of stroke or TIA. The acronym BEFAST is an easy way to remember the main warning signs of stroke. ? B = Balance problems. Signs include dizziness, sudden trouble walking, or loss of balance ? E = Eye problems. This includes trouble seeing or a sudden change in vision. ? F = Face changes. This includes sudden weakness or numbness of the face, or the face or eyelid drooping to one side. ? A = Arm weakness or numbness. This happens suddenly and usually on one side of the body. ? S = Speech problems. This includes trouble speaking or  trouble understanding. ? T = Time. Time to call 911 or seek emergency care. Do not wait to see if symptoms go away. Make note of the time your symptoms started.  Other signs of stroke may include: ? A sudden, severe headache with no known cause. ? Feeling sick to your stomach (nauseous) or throwing up (vomiting). ? Seizure. Call your local emergency services (911 in [place] Do notdrive yourself to the clinic or hospital. Summary  The carotid arteries are arteries on both sides of the neck.  If these arteries get smaller or get blocked, you are more likely to have a stroke or a warning stroke (transient ischemic attack).  Take over-the-counter and prescription medicines only as told by your doctor.  Keep all follow-up visits as told by your doctor. This is important. This information is not intended to replace advice given to you by your health care provider. Make sure you discuss any questions you have with your health care provider. Document Released: 12/14/2011 Document Revised: 12/22/2016 Document Reviewed: 12/22/2016 Elsevier Interactive Patient Education  2019 [employer]  

## 2018-03-20 NOTE — Telephone Encounter (Signed)
Copied from [place] [id number]. Topic: Quick Communication - Rx Refill/Question >> Mar 20, 2018  2:10 PM Gustavus Messing wrote: Medication: apixaban (ELIQUIS) 5 MG TABS tablet   Has the patient contacted their pharmacy? Yes.   (Agent: If yes, when and what did the pharmacy advise?) Pharmacy is having trouble faxing over request  Preferred Pharmacy (with phone number or street name): [facility], [place] [phone] (Phone) [phone] (Fax)    Agent: Please be advised that RX refills may take up to 3 business days. We ask that you follow-up with your pharmacy.

## 2018-03-20 NOTE — Assessment & Plan Note (Signed)
Recent carotid duplex shows 60 to 79% carotid artery stenosis bilaterally which is asymptomatic.  He is getting injectable therapy for cholesterol and is on a blood thinner.  No role for intervention at this level.  Discussed the pathophysiology and natural history of carotid disease.  Will continue current medical regimen and recheck in 6 months.

## 2018-03-20 NOTE — Patient Instructions (Addendum)
Nice to see you. Please get your lab work done.  If you have not heard from [place] regarding your request in the next week please contact us. If you have recurrent chest pain please be reevaluated. We will get you scheduled with cardiology for follow-up.

## 2018-03-20 NOTE — Progress Notes (Signed)
Patient ID: Peter [name], male   DOB: Aug 15, 1955, 63 y.o.   MRN: [id number]  Chief Complaint  Patient presents with  . New Patient (Initial Visit)    HPI Peter [name] is a 63 y.o. male.  I am asked to see the patient by Dr. Caryl Bis for evaluation of carotid stenosis.  The patient reports no symptoms of cerebrovascular insufficiency.  Specifically, he denies arm or leg weakness or numbness, speech or swallowing difficulty, temporary monocular blindness, or syncope.  He had a previously performed carotid duplex that was performed secondary to an asymptomatic bruit.  This demonstrates 60 to 79% stenosis bilaterally.  As such, he is referred for further evaluation and treatment.  As per risk factors, he reports his cholesterol is under much better control after getting injections.  He is a very light smoker after having previously been a heavy smoker.   Past Medical History:  Diagnosis Date  . A-fib ([place])   . Abnormality of gait 09/03/2012  . Alcohol abuse   . Anxiety   . Arthritis   . Arthritis   . Esophageal stricture   . GERD (gastroesophageal reflux disease)   . Gout   . Hyperlipidemia   . Hypertension   . Internal hemorrhoids   . Lesion of ulnar nerve 09/03/2012   Right ulnar neuropathy  . Murmur, cardiac   . PAF (paroxysmal atrial fibrillation) ([place])   . Palpitations   . Prostate cancer ([place]) 2016   treated with radioactive seed implant  . Seizures ([place])    pt states he had seizure 12/14  . Tubular adenoma of colon 03/2008    Past Surgical History:  Procedure Laterality Date  . CATARACT EXTRACTION W/PHACO Right 07/03/2014   Procedure: CATARACT EXTRACTION PHACO AND INTRAOCULAR LENS PLACEMENT (IOC);  Surgeon: Lyla Glassing, MD;  Location: [facility] ORS;  Service: Ophthalmology;  Laterality: Right;  lot # [id number] H [place]: 00:32.3 AP% 9.1 CDE: 2.92  . CIRCUMCISION    . COLONOSCOPY    . ELBOW SURGERY    . ELECTROPHYSIOLOGIC STUDY N/A 11/02/2015   Procedure:  CARDIOVERSION;  Surgeon: Minna Merritts, MD;  Location: [facility] ORS;  Service: Cardiovascular;  Laterality: N/A;  . ESOPHAGOGASTRODUODENOSCOPY (EGD) WITH PROPOFOL N/A 05/17/2016   Procedure: ESOPHAGOGASTRODUODENOSCOPY (EGD) WITH PROPOFOL;  Surgeon: Lucilla Lame, MD;  Location: [facility] ENDOSCOPY;  Service: Endoscopy;  Laterality: N/A;  . EYE SURGERY    . HAND SURGERY Left   . SHOULDER ARTHROSCOPY Left   . SHOULDER SURGERY      Family History  Problem Relation Age of Onset  . Diabetes Father   . Kidney disease Father   . Lung cancer Father        smoker  . Hyperlipidemia Father   . Hypertension Father   . Lung cancer Mother        smoke  . Hypertension Mother   . Colon cancer Neg Hx   . Pancreatic cancer Neg Hx   . Stomach cancer Neg Hx     Social History Social History   Tobacco Use  . Smoking status: Current Every Day Smoker    Packs/day: 0.10    Years: 24.00    Pack years: 2.40    Types: Cigarettes  . Smokeless tobacco: Never Used  . Tobacco comment: down to 4-5  ciggs a day.  Substance Use Topics  . Alcohol use: Yes    Frequency: Never    Comment: 4 beers daily   . Drug use: No  Types: Marijuana    Comment: 1970's occ.     Allergies  Allergen Reactions  . Hydrocodone Bitartrate Er Other (See Comments)    Makes him "feel funny"  . Clonazepam     Syncope  . Crestor [Rosuvastatin] Other (See Comments)    Bloating, swelling of legs, fatty liver  . Dust Mite Extract   . Gabapentin     Leg twitching at night, ineffective   . Lipitor [Atorvastatin] Other (See Comments)    Myalgias  . Lisinopril Swelling  . Topamax [Topiramate]     Cognitive clouding    Current Outpatient Medications  Medication Sig Dispense Refill  . albuterol (PROVENTIL HFA;VENTOLIN HFA) 108 (90 Base) MCG/ACT inhaler Inhale 2 puffs into the lungs every 6 (six) hours as needed for wheezing or shortness of breath. 1 Inhaler 0  . Alirocumab (PRALUENT) 150 MG/ML SOAJ Inject 1 pen into the skin  every 14 (fourteen) days. 2 pen 11  . apixaban (ELIQUIS) 5 MG TABS tablet Take 1 tablet (5 mg total) by mouth 2 (two) times daily. 180 tablet 1  . B Complex-C (B-COMPLEX WITH VITAMIN C) tablet Take 1 tablet by mouth daily.    . busPIRone (BUSPAR) 10 MG tablet TAKE ONE TABLET BY MOUTH THREE TIMES A DAY 90 tablet 1  . doxazosin (CARDURA) 8 MG tablet Take 1 tablet (8 mg total) by mouth 2 (two) times daily. 180 tablet 3  . FOLIC ACID PO Take by mouth daily.    . hydrochlorothiazide (HYDRODIURIL) 12.5 MG tablet Take 1 tablet (12.5 mg total) by mouth daily. In the am for blood pressure 30 tablet 2  . Melatonin 10 MG TABS Take 1 tablet by mouth at bedtime.    . milk thistle 175 MG tablet Take 175 mg by mouth daily.    . Multiple Vitamin (MULTIVITAMIN) tablet Take 1 tablet by mouth daily. Centrum Plus -Take 1 daily    . nebivolol (BYSTOLIC) 10 MG tablet Take 10 mg by mouth daily.    . nitroGLYCERIN (NITROSTAT) 0.4 MG SL tablet Place 1 tablet (0.4 mg total) under the tongue every 5 (five) minutes as needed for chest pain. 25 tablet 3  . traMADol (ULTRAM) 50 MG tablet TAKE ONE TABLET BY MOUTH EVERY 12 HOURS AS NEEDED FOR MODERATE PAIN 60 tablet 0  . triamcinolone cream (KENALOG) 0.1 % Apply 1 application topically daily as needed.     . VOLTAREN 1 % GEL Apply 2 g topically as needed (knees).     Marland Kitchen aspirin 81 MG chewable tablet Chew 1 tablet (81 mg total) by mouth daily. (Patient not taking: Reported on 03/20/2018) 30 tablet 0  . doxycycline (VIBRA-TABS) 100 MG tablet Take 1 tablet (100 mg total) by mouth 2 (two) times daily. X 10 days with food (Patient not taking: Reported on 03/20/2018) 20 tablet 0   No current facility-administered medications for this visit.       REVIEW OF SYSTEMS (Negative unless checked)  Constitutional: '[]'$ Weight loss  '[]'$ Fever  '[]'$ Chills Cardiac: '[]'$ Chest pain   '[]'$ Chest pressure   '[]'$ Palpitations   '[]'$ Shortness of breath when laying flat   '[]'$ Shortness of breath at rest   '[]'$ Shortness  of breath with exertion. Vascular:  '[]'$ Pain in legs with walking   '[]'$ Pain in legs at rest   '[]'$ Pain in legs when laying flat   '[]'$ Claudication   '[]'$ Pain in feet when walking  '[]'$ Pain in feet at rest  '[]'$ Pain in feet when laying flat   '[]'$ History of DVT   '[]'$   Phlebitis   '[]'$ Swelling in legs   '[]'$ Varicose veins   '[]'$ Non-healing ulcers Pulmonary:   '[]'$ Uses home oxygen   '[]'$ Productive cough   '[]'$ Hemoptysis   '[]'$ Wheeze  '[]'$ COPD   '[]'$ Asthma Neurologic:  '[]'$ Dizziness  '[]'$ Blackouts   '[]'$ Seizures   '[]'$ History of stroke   '[]'$ History of TIA  '[]'$ Aphasia   '[]'$ Temporary blindness   '[]'$ Dysphagia   '[]'$ Weakness or numbness in arms   '[]'$ Weakness or numbness in legs Musculoskeletal:  '[x]'$ Arthritis   '[]'$ Joint swelling   '[]'$ Joint pain   '[]'$ Low back pain Hematologic:  '[]'$ Easy bruising  '[]'$ Easy bleeding   '[]'$ Hypercoagulable state   '[]'$ Anemic  '[]'$ Hepatitis Gastrointestinal:  '[]'$ Blood in stool   '[]'$ Vomiting blood  '[x]'$ Gastroesophageal reflux/heartburn   '[]'$ Abdominal pain Genitourinary:  '[]'$ Chronic kidney disease   '[]'$ Difficult urination  '[]'$ Frequent urination  '[]'$ Burning with urination   '[]'$ Hematuria Skin:  '[]'$ Rashes   '[]'$ Ulcers   '[]'$ Wounds Psychological:  '[x]'$ History of anxiety   '[]'$  History of major depression.    Physical Exam BP 119/73 (BP Location: Left Arm, Patient Position: Sitting, Cuff Size: Large)   Pulse 73   Resp 12   Ht '6\' 1"'$  (1.854 m)   Wt 254 lb (115.2 kg)   BMI 33.51 kg/m  Gen:  WD/WN, NAD Head: [place]/AT, No temporalis wasting Ear/Nose/Throat: Hearing grossly intact, nares w/o erythema or drainage, oropharynx w/o Erythema/Exudate Eyes: Conjunctiva clear, sclera non-icteric  Neck: trachea midline.  Soft bilateral carotid bruits Pulmonary:  Good air movement, clear to auscultation bilaterally.  Cardiac: RRR, normal S1, S2 Vascular:  Vessel Right Left  Radial Palpable Palpable                                   Gastrointestinal: soft, non-tender/non-distended. Musculoskeletal: M/S 5/5 throughout.  Extremities without ischemic changes.  No  deformity or atrophy. No edema. Neurologic: Sensation grossly intact in extremities.  Symmetrical.  Speech is fluent. Motor exam as listed above. Psychiatric: Judgment intact, Mood & affect appropriate for pt's clinical situation. Dermatologic: No rashes or ulcers noted.  No cellulitis or open wounds.   Radiology Dg Chest 2 View  Result Date: 03/07/2018 CLINICAL DATA:  Mid chest pain radiating to the neck. EXAM: CHEST - 2 VIEW COMPARISON:  10/31/2017 FINDINGS: Heart size and pulmonary vascularity are normal. Mild hyperinflation suggesting emphysema. Peribronchial thickening consistent with chronic bronchitis. No airspace disease or consolidation in the lungs. No blunting of costophrenic angles. No pneumothorax. Mediastinal contours appear intact. Calcification of the aorta. Old fracture deformity of the distal right clavicle. Degenerative changes in the spine. IMPRESSION: Emphysematous and chronic bronchitic changes in the lungs. No evidence of active pulmonary disease. Electronically Signed   By: Lucienne Capers M.D.   On: 03/07/2018 23:18   Mr Brain Wo Contrast  Result Date: 03/08/2018 CLINICAL DATA:  Periorbital headache for 10 days. Hypertensive. History of hypertension, hyperlipidemia, alcohol abuse, seizures. EXAM: MRI HEAD WITHOUT CONTRAST TECHNIQUE: Multiplanar, multiecho pulse sequences of the brain and surrounding structures were obtained without intravenous contrast. COMPARISON:  CT HEAD January 19, 2017 FINDINGS: INTRACRANIAL CONTENTS: No reduced diffusion to suggest acute ischemia. No susceptibility artifact to suggest hemorrhage. Moderate parenchymal brain volume loss for age. No hydrocephalus. Patchy supratentorial white matter FLAIR T2 hyperintensities. No suspicious parenchymal signal, masses, mass effect. No abnormal extra-axial fluid collections. No extra-axial masses. VASCULAR: Normal major intracranial vascular flow voids present at skull base. SKULL AND UPPER CERVICAL SPINE: No  abnormal sellar expansion. No suspicious calvarial bone  marrow signal. Craniocervical junction maintained. SINUSES/ORBITS: Trace RIGHT mastoid effusion. Minimal paranasal sinus mucosal thickening.The included ocular globes and orbital contents are non-suspicious. Status post bilateral ocular lens implants. OTHER: None. IMPRESSION: 1. No acute intracranial process. 2. Moderate parenchymal brain volume loss, advanced for age. 3. Mild chronic small vessel ischemic changes. Electronically Signed   By: Elon Alas M.D.   On: 03/08/2018 06:28   Xr C-arm No Report  Result Date: 02/20/2018 Please see Notes tab for imaging impression.   Labs Recent Results (from the past 2160 hour(s))  CBC     Status: Abnormal   Collection Time: 01/04/18  2:05 PM  Result Value Ref Range   WBC 6.9 3.4 - 10.8 x10E3/uL   RBC 3.18 (L) 4.14 - 5.80 x10E6/uL   Hemoglobin 12.5 (L) 13.0 - 17.7 g/dL   Hematocrit 33.4 (L) 37.5 - 51.0 %   MCV 105 (H) 79 - 97 fL   MCH 39.3 (H) 26.6 - 33.0 pg   MCHC 37.4 (H) 31.5 - 35.7 g/dL   RDW 13.1 12.3 - 15.4 %    Comment: **Effective January 15, 2018, the RDW pediatric reference**   interval will be removed and the adult reference interval   will be changing to:                             Male 11.7 - 15.4                                                      Male 11.6 - 15.4    Platelets 68 (LL) 150 - 450 x10E3/uL   Hematology Comments: Note:     Comment: Verified by microscopic examination.  Comp Met (CMET)     Status: Abnormal   Collection Time: 01/25/18  1:40 PM  Result Value Ref Range   Glucose 136 (H) 65 - 99 mg/dL   BUN 15 8 - 27 mg/dL   Creatinine, Ser 0.89 0.76 - 1.27 mg/dL   GFR calc non Af Amer 92 >59 mL/min/1.73   GFR calc Af Amer 107 >59 mL/min/1.73   BUN/Creatinine Ratio 17 10 - 24   Sodium 136 134 - 144 mmol/L   Potassium 3.8 3.5 - 5.2 mmol/L   Chloride 95 (L) 96 - 106 mmol/L   CO2 23 20 - 29 mmol/L   Calcium 9.9 8.6 - 10.2 mg/dL   Total Protein 6.9 6.0  - 8.5 g/dL   Albumin 3.6 3.6 - 4.8 g/dL    Comment:     **Effective January 29, 2018 Albumin reference**       interval will be changing to:              Age                Male          Male           0 -  7 days        3.6 - 4.9      3.6 - 4.9           8 - 30 days        3.4 - 4.7      3.4 - 4.7  1 -  6 month       3.7 - 4.8      3.7 - 4.8    7 months -  2 years       3.9 - 5.0      3.9 - 5.0           3 -  5 years       4.0 - 5.0      4.0 - 5.0           6 - 12 years       4.1 - 5.0      4.0 - 5.0          13 - 30 years       4.1 - 5.2      3.9 - 5.0          31 - 50 years       4.0 - 5.0      3.8 - 4.8          51 - 60 years       3.8 - 4.9      3.8 - 4.9          61 - 70 years       3.8 - 4.8      3.8 - 4.8          71 - 80 years       3.7 - 4.7      3.7 - 4.7          81 - 89 years       3.6 - 4.6      3.6 - 4.6              >89 years       3.5 - 4.6      3.5 - 4.6    Globulin, Total 3.3 1.5 - 4.5 g/dL   Albumin/Globulin Ratio 1.1 (L) 1.2 - 2.2   Bilirubin Total 2.3 (H) 0.0 - 1.2 mg/dL   Alkaline Phosphatase 102 39 - 117 IU/L   AST 80 (H) 0 - 40 IU/L   ALT 33 0 - 44 IU/L  TSH     Status: None   Collection Time: 01/25/18  1:42 PM  Result Value Ref Range   TSH 0.980 0.450 - 4.500 uIU/mL  Basic metabolic panel     Status: Abnormal   Collection Time: 01/25/18  1:42 PM  Result Value Ref Range   Glucose 139 (H) 65 - 99 mg/dL   BUN 15 8 - 27 mg/dL   Creatinine, Ser 0.82 0.76 - 1.27 mg/dL   GFR calc non Af Amer 95 >59 mL/min/1.73   GFR calc Af Amer 110 >59 mL/min/1.73   BUN/Creatinine Ratio 18 10 - 24   Sodium 136 134 - 144 mmol/L   Potassium 3.8 3.5 - 5.2 mmol/L   Chloride 96 96 - 106 mmol/L   CO2 23 20 - 29 mmol/L   Calcium 9.8 8.6 - 10.2 mg/dL  B12     Status: Abnormal   Collection Time: 01/25/18  1:43 PM  Result Value Ref Range   Vitamin B-12 >2000 (H) 232 - 1245 pg/mL  CBC with Differential     Status: Abnormal   Collection Time: 03/07/18 10:52 PM    Result Value Ref Range   WBC 7.2 4.0 - 10.5 K/uL   RBC 3.38 (L) 4.22 - 5.81 MIL/uL   Hemoglobin 13.4 13.0 -  17.0 g/dL   HCT 36.2 (L) 39.0 - 52.0 %   MCV 107.1 (H) 80.0 - 100.0 fL   MCH 39.6 (H) 26.0 - 34.0 pg   MCHC 37.0 (H) 30.0 - 36.0 g/dL   RDW 13.3 11.5 - 15.5 %   Platelets 66 (L) 150 - 400 K/uL    Comment: SPECIMEN CHECKED FOR CLOTS Immature Platelet Fraction may be clinically indicated, consider ordering this additional test [id number]    nRBC 0.0 0.0 - 0.2 %   Neutrophils Relative % 75 %   Neutro Abs 5.4 1.7 - 7.7 K/uL   Lymphocytes Relative 13 %   Lymphs Abs 1.0 0.7 - 4.0 K/uL   Monocytes Relative 11 %   Monocytes Absolute 0.8 0.1 - 1.0 K/uL   Eosinophils Relative 0 %   Eosinophils Absolute 0.0 0.0 - 0.5 K/uL   Basophils Relative 1 %   Basophils Absolute 0.1 0.0 - 0.1 K/uL   Immature Granulocytes 0 %   Abs Immature Granulocytes 0.02 0.00 - 0.07 K/uL    Comment: Performed at [facility], [address] [place], [place] [address]  Comprehensive metabolic panel     Status: Abnormal   Collection Time: 03/07/18 10:52 PM  Result Value Ref Range   Sodium 134 (L) 135 - 145 mmol/L   Potassium 3.7 3.5 - 5.1 mmol/L   Chloride 95 (L) 98 - 111 mmol/L   CO2 26 22 - 32 mmol/L   Glucose, Bld 172 (H) 70 - 99 mg/dL   BUN 13 8 - 23 mg/dL   Creatinine, Ser 0.76 0.61 - 1.24 mg/dL   Calcium 9.5 8.9 - 10.3 mg/dL   Total Protein 7.6 6.5 - 8.1 g/dL   Albumin 3.6 3.5 - 5.0 g/dL   AST 97 (H) 15 - 41 U/L   ALT 40 0 - 44 U/L   Alkaline Phosphatase 92 38 - 126 U/L   Total Bilirubin 3.9 (H) 0.3 - 1.2 mg/dL   GFR calc non Af Amer >60 >60 mL/min   GFR calc Af Amer >60 >60 mL/min   Anion gap 13 5 - 15    Comment: Performed at [facility], [address] [place], [place] [address]  Troponin I - ONCE - STAT     Status: Abnormal   Collection Time: 03/07/18 10:52 PM  Result Value Ref Range   Troponin I 0.10 (HH) <0.03 ng/mL    Comment: CRITICAL RESULT CALLED TO, READ  BACK BY AND VERIFIED WITH JENNA ELLINGTON AT 0003 03/08/2018 SMA Performed at [facility], [address] [place], [place] [address]   Troponin I - ONCE - STAT     Status: Abnormal   Collection Time: 03/08/18  2:19 AM  Result Value Ref Range   Troponin I 0.11 (HH) <0.03 ng/mL    Comment: CRITICAL VALUE NOTED. VALUE IS CONSISTENT WITH PREVIOUSLY REPORTED/CALLED VALUE SMA Performed at [facility], [address] [place], [place] [address]   Troponin I - Now Then Q6H     Status: Abnormal   Collection Time: 03/08/18  9:57 AM  Result Value Ref Range   Troponin I 0.18 (HH) <0.03 ng/mL    Comment: CRITICAL VALUE NOTED. VALUE IS CONSISTENT WITH PREVIOUSLY REPORTED/CALLED VALUE FMW Performed at [facility], [address] [place], [place] [address]   Troponin I - Now Then Q6H     Status: Abnormal   Collection Time: 03/08/18  4:05 PM  Result Value Ref Range   Troponin  I 0.16 (HH) <0.03 ng/mL    Comment: CRITICAL VALUE NOTED. VALUE IS CONSISTENT WITH PREVIOUSLY REPORTED/CALLED VALUE/JUW Performed at [facility], [address] [place], [place] [address]   Troponin I - Now Then Q6H     Status: Abnormal   Collection Time: 03/08/18  9:27 PM  Result Value Ref Range   Troponin I 0.21 (HH) <0.03 ng/mL    Comment: CRITICAL VALUE NOTED. VALUE IS CONSISTENT WITH PREVIOUSLY REPORTED/CALLED VALUE SMA Performed at [facility], [address] [place], [place] [address]   Troponin I - Now Then Q6H     Status: Abnormal   Collection Time: 03/09/18  3:52 AM  Result Value Ref Range   Troponin I 0.26 (HH) <0.03 ng/mL    Comment: CRITICAL VALUE NOTED. VALUE IS CONSISTENT WITH PREVIOUSLY REPORTED/CALLED VALUE SMA Performed at [facility], [address] [place], [place] [address]   Basic metabolic panel     Status: Abnormal   Collection Time: 03/09/18  3:52 AM  Result Value Ref Range   Sodium 132 (L) 135 - 145 mmol/L   Potassium 3.3  (L) 3.5 - 5.1 mmol/L   Chloride 94 (L) 98 - 111 mmol/L   CO2 25 22 - 32 mmol/L   Glucose, Bld 127 (H) 70 - 99 mg/dL   BUN 23 8 - 23 mg/dL   Creatinine, Ser 0.87 0.61 - 1.24 mg/dL   Calcium 9.3 8.9 - 10.3 mg/dL   GFR calc non Af Amer >60 >60 mL/min   GFR calc Af Amer >60 >60 mL/min   Anion gap 13 5 - 15    Comment: Performed at [facility], [address] [place], [place] [address]  CBC     Status: Abnormal   Collection Time: 03/09/18  3:52 AM  Result Value Ref Range   WBC 8.8 4.0 - 10.5 K/uL   RBC 3.30 (L) 4.22 - 5.81 MIL/uL   Hemoglobin 12.8 (L) 13.0 - 17.0 g/dL   HCT 35.2 (L) 39.0 - 52.0 %   MCV 106.7 (H) 80.0 - 100.0 fL   MCH 38.8 (H) 26.0 - 34.0 pg   MCHC 36.4 (H) 30.0 - 36.0 g/dL   RDW 13.7 11.5 - 15.5 %   Platelets 65 (L) 150 - 400 K/uL    Comment: SPECIMEN CHECKED FOR CLOTS Immature Platelet Fraction may be clinically indicated, consider ordering this additional test [id number] CONSISTENT WITH PREVIOUS RESULT    nRBC 0.0 0.0 - 0.2 %    Comment: Performed at [facility], [address] [place], [place] [address]  Magnesium     Status: Abnormal   Collection Time: 03/09/18  3:52 AM  Result Value Ref Range   Magnesium 1.3 (L) 1.7 - 2.4 mg/dL    Comment: Performed at [facility], [address] [place], [place] [address]  Troponin I - Now Then Q6H     Status: Abnormal   Collection Time: 03/09/18  9:28 AM  Result Value Ref Range   Troponin I 0.27 (HH) <0.03 ng/mL    Comment: CRITICAL VALUE NOTED. VALUE IS CONSISTENT WITH PREVIOUSLY REPORTED/CALLED VALUE  SDR Performed at [facility], [address] [place], [place] [address]   Troponin I - Now Then Q6H     Status: Abnormal   Collection Time: 03/09/18  3:41 PM  Result Value Ref Range   Troponin I 0.26 (HH) <0.03 ng/mL    Comment: CRITICAL VALUE NOTED. VALUE IS CONSISTENT WITH PREVIOUSLY REPORTED/CALLED VALUE.PMF  Performed at [facility], [address]  [place], [place] [address]   Troponin I - Now Then Q6H     Status: Abnormal   Collection Time: 03/09/18  9:35 PM  Result Value Ref Range   Troponin I 0.23 (HH) <0.03 ng/mL    Comment: CRITICAL VALUE NOTED. VALUE IS CONSISTENT WITH PREVIOUSLY REPORTED/CALLED VALUE / [place] Performed at [facility], [address] [place], [place] [address]   Basic metabolic panel     Status: Abnormal   Collection Time: 03/10/18  6:06 AM  Result Value Ref Range   Sodium 130 (L) 135 - 145 mmol/L   Potassium 3.6 3.5 - 5.1 mmol/L   Chloride 95 (L) 98 - 111 mmol/L   CO2 24 22 - 32 mmol/L   Glucose, Bld 127 (H) 70 - 99 mg/dL   BUN 32 (H) 8 - 23 mg/dL   Creatinine, Ser 0.94 0.61 - 1.24 mg/dL   Calcium 9.2 8.9 - 10.3 mg/dL   GFR calc non Af Amer >60 >60 mL/min   GFR calc Af Amer >60 >60 mL/min   Anion gap 11 5 - 15    Comment: Performed at [facility], [address] [place], [place] [address]  Magnesium     Status: Abnormal   Collection Time: 03/10/18  6:06 AM  Result Value Ref Range   Magnesium 1.5 (L) 1.7 - 2.4 mg/dL    Comment: Performed at [facility], [address] [place], [place] [address]  CBC     Status: Abnormal   Collection Time: 03/10/18  6:06 AM  Result Value Ref Range   WBC 7.7 4.0 - 10.5 K/uL   RBC 3.13 (L) 4.22 - 5.81 MIL/uL   Hemoglobin 12.3 (L) 13.0 - 17.0 g/dL   HCT 34.2 (L) 39.0 - 52.0 %   MCV 109.3 (H) 80.0 - 100.0 fL   MCH 39.3 (H) 26.0 - 34.0 pg   MCHC 36.0 30.0 - 36.0 g/dL   RDW 14.2 11.5 - 15.5 %   Platelets 67 (L) 150 - 400 K/uL    Comment: Immature Platelet Fraction may be clinically indicated, consider ordering this additional test [id number]    nRBC 0.0 0.0 - 0.2 %    Comment: Performed at [facility], [address], [place], [place] [address]  Phosphorus     Status: None   Collection Time: 03/10/18  6:06 AM  Result Value Ref Range   Phosphorus 4.3 2.5 - 4.6 mg/dL    Comment: Performed at [facility], [address] [place], [place] [address]  ECHOCARDIOGRAM COMPLETE     Status: None   Collection Time: 03/10/18 12:01 PM  Result Value Ref Range   Weight 4,124.8 oz   BP 112/44 mmHg    Assessment/Plan:  Hyperlipidemia lipid control important in reducing the progression of atherosclerotic disease. Intolerant to statins.  Now on an injectable medicine with a marked reduction in his cholesterol   Essential hypertension blood pressure control important in reducing the progression of atherosclerotic disease. On appropriate oral medications.   Carotid artery stenosis Recent carotid duplex shows 60 to 79% carotid artery stenosis bilaterally which is asymptomatic.  He is getting injectable therapy for cholesterol and is on a blood thinner.  No role for intervention at this level.  Discussed the pathophysiology and natural history of carotid disease.  Will continue current medical regimen and recheck in 6 months.      Leotis Pain 03/20/2018, 3:00 PM  This note was created with Dragon medical transcription system.  Any errors from dictation are unintentional.

## 2018-03-20 NOTE — Assessment & Plan Note (Addendum)
lipid control important in reducing the progression of atherosclerotic disease. Intolerant to statins.  Now on an injectable medicine with a marked reduction in his cholesterol

## 2018-03-20 NOTE — Assessment & Plan Note (Signed)
blood pressure control important in reducing the progression of atherosclerotic disease. On appropriate oral medications.  

## 2018-03-21 NOTE — Telephone Encounter (Signed)
Patient calling States that he spoke with [facility] and they have not received the prescription for Eliquis Patient is requesting that we call pharmacist Parks Neptune directly at [phone] Please advise

## 2018-03-21 NOTE — Telephone Encounter (Signed)
Spoke w/ pharmacist, apparently our systems are not communicating, as she has sent request over several times and has not received a response on her end.  Gave verbal order for Eliquis 5 mg BID, #180 w/ 1 refill.  She is appreciative of the call and will let pt know that rx is being filled.

## 2018-03-22 DIAGNOSIS — J301 Allergic rhinitis due to pollen: Secondary | ICD-10-CM | POA: Diagnosis not present

## 2018-03-23 ENCOUNTER — Telehealth (INDEPENDENT_AMBULATORY_CARE_PROVIDER_SITE_OTHER): Payer: Self-pay | Admitting: *Deleted

## 2018-03-25 ENCOUNTER — Telehealth: Payer: Self-pay | Admitting: Family Medicine

## 2018-03-25 DIAGNOSIS — R079 Chest pain, unspecified: Secondary | ICD-10-CM | POA: Insufficient documentation

## 2018-03-25 DIAGNOSIS — E871 Hypo-osmolality and hyponatremia: Secondary | ICD-10-CM | POA: Insufficient documentation

## 2018-03-25 NOTE — Assessment & Plan Note (Signed)
This needs to be rechecked.  I discussed this been rechecked with the patient and placed an order for this to be done at [facility].

## 2018-03-25 NOTE — Telephone Encounter (Signed)
Patient seen in the office last week.  Discussed his concerns with Peter [name].  I will forward this message to her to contact the patient to discuss his concerns.  Peter [name], could you also please contact the patient and see if he has been able to have his lab work completed.  If he has not he needs to go to [facility] to have this done as soon as he is able to.  We additionally discussed that he has been drinking 4 beers daily during his visit.  Please advise him that he should not suddenly stop drinking alcohol again and if he is going to stop drinking alcohol he should taper down on this.  Please let him know somebody should be contacting him regarding his concerns that we discussed during his office visit.  If he does not hear anything early this week he should contact us.  Thanks.

## 2018-03-25 NOTE — Progress Notes (Signed)
Tommi Rumps, MD Phone: [phone]  Peter [name] is a 63 y.o. male who presents today for hospital follow-up.  Patient was hospitalized from 03/08/2018-03/10/2018 for alcohol withdrawal syndrome and chest pain.  Per the H&P the patient presented noting that he was undergoing alcohol withdrawal and had some chest tightness.  They noted that he came to the emergency room after he decided to go cold [place] with drinking alcohol.  It was noted that he drank a 12 pack of beer on a daily basis and then decided to give up alcohol.  Today he notes that he has only been drinking 4 beers a day and that they are Michelob ultras.  He has not been drinking 12 beers a day per his report today.  He notes he can stop drinking whenever he likes.  He does note he was a heavy drinker in his younger years and did drink a 12 pack of beer daily when he was younger.  He noted he had a high pressure job at that time.  He notes he is not an alcoholic and he would like this removed from his chart.  He notes that he feels he has been unfairly labeled.  He notes on my chart it appears that he has a diagnosis of alcohol abuse.  He notes he has a friend who is a substance abuse counselor who has noted that the patient is not an alcoholic.  This is per the patient's report.  He does note he has legal resources that he can contact to get it expunged from his record.  He notes he is not going to sue anybody though he could involve a lawyer to help get this removed.  His chest tightness was evaluated with troponin testing which did reveal a mildly elevated troponin that was felt to be related to demand ischemia.  It was noted that he declined inpatient ischemic work-up and was to follow-up with cardiology as an outpatient.  He notes he has had no recurrence of chest pain.  He was found to be hyponatremic on lab work and is due for follow-up labs which he prefers to get through [facility].  He does have chronic pain and takes tramadol  twice daily for this.  He wonders what else can be done for his pain.  He does follow with his pain specialist though they are not prescribing him any pain medication currently.  Discharge summary reviewed.  Medications reviewed.  Social History   Tobacco Use  Smoking Status Current Every Day Smoker   Packs/day: 0.10   Years: 24.00   Pack years: 2.40   Types: Cigarettes  Smokeless Tobacco Never Used  Tobacco Comment   down to 4-5  ciggs a day.     ROS see history of present illness  Objective  Physical Exam Vitals:   03/20/18 1137 03/20/18 1236  BP: (!) 158/70 140/62  Pulse: 61   Temp: 98.4 F (36.9 C)   SpO2: 98%     BP Readings from Last 3 Encounters:  03/20/18 119/73  03/20/18 140/62  03/10/18 (!) 112/54   Wt Readings from Last 3 Encounters:  03/20/18 254 lb (115.2 kg)  03/20/18 254 lb 12.8 oz (115.6 kg)  03/10/18 257 lb 12.8 oz (116.9 kg)    Physical Exam Constitutional:      General: He is not in acute distress.    Appearance: He is not diaphoretic.  Cardiovascular:     Rate and Rhythm: Normal rate and regular rhythm.  Heart sounds: Normal heart sounds.  Pulmonary:     Effort: Pulmonary effort is normal.     Breath sounds: Normal breath sounds.  Musculoskeletal:     Right lower leg: No edema.     Left lower leg: No edema.  Skin:    General: Skin is warm and dry.  Neurological:     Mental Status: He is alert.      Assessment/Plan: Please see individual problem list.  Alcohol withdrawal ([place]) Diagnosed with alcohol withdrawal during hospitalization.  Today he reports that his alcohol intake is significantly less than what is reported from his ED and hospitalization notes.  Patient notes that he would like the diagnosis of alcohol abuse removed from his chart.  I advised that I could check into this and see if this is possible though based on review of his current problem list it appears that he has history of alcohol abuse on his problem list  which would be accurate given his prior history of drinking a 12 pack of beer on a daily basis.  I advised the patient that we would check into this for him.  Chest pain This has not recurred.  Troponin elevation likely related to demand ischemia from elevated blood pressure.  Discussed the need for him to follow-up with cardiology.  Referral placed.  Given return precautions.  Hyponatremia This needs to be rechecked.  I discussed this been rechecked with the patient and placed an order for this to be done at [facility].  Chronic low back pain (Primary Area of Pain) (Bilateral) (L>R) w/ sciatica (Bilateral) Patient continues on tramadol for his pain.  Discussed that his dosage is limited given his chronic liver disease.  I advised that he would need to see a pain specialist for consideration of anything stronger than this.  I will forward my note to his pain specialist so that they are aware of this and to determine if they would like to proceed with anything additional for his pain.    Orders Placed This Encounter  Procedures   Basic Metabolic Panel (BMET)   CBC   Ambulatory referral to Cardiology    Referral Priority:   Routine    Referral Type:   Consultation    Referral Reason:   Specialty Services Required    Requested Specialty:   Cardiology    Number of Visits Requested:   1    No orders of the defined types were placed in this encounter.    Tommi Rumps, MD [place]

## 2018-03-25 NOTE — Assessment & Plan Note (Signed)
Diagnosed with alcohol withdrawal during hospitalization.  Today he reports that his alcohol intake is significantly less than what is reported from his ED and hospitalization notes.  Patient notes that he would like the diagnosis of alcohol abuse removed from his chart.  I advised that I could check into this and see if this is possible though based on review of his current problem list it appears that he has history of alcohol abuse on his problem list which would be accurate given his prior history of drinking a 12 pack of beer on a daily basis.  I advised the patient that we would check into this for him.

## 2018-03-25 NOTE — Assessment & Plan Note (Signed)
Patient continues on tramadol for his pain.  Discussed that his dosage is limited given his chronic liver disease.  I advised that he would need to see a pain specialist for consideration of anything stronger than this.  I will forward my note to his pain specialist so that they are aware of this and to determine if they would like to proceed with anything additional for his pain.

## 2018-03-25 NOTE — Assessment & Plan Note (Addendum)
This has not recurred.  Troponin elevation likely related to demand ischemia from elevated blood pressure.  Discussed the need for him to follow-up with cardiology.  Referral placed.  Given return precautions.

## 2018-03-26 ENCOUNTER — Telehealth: Payer: Self-pay | Admitting: Family Medicine

## 2018-03-26 ENCOUNTER — Telehealth (INDEPENDENT_AMBULATORY_CARE_PROVIDER_SITE_OTHER): Payer: Self-pay | Admitting: *Deleted

## 2018-03-26 NOTE — Telephone Encounter (Signed)
Called pt and lvm #1 

## 2018-03-26 NOTE — Telephone Encounter (Signed)
Please let the patient know I heard back from Dr Ernestina Patches regarding the patients pain. He recommended that the patient be followed in a chronic pain management program which Dr Ernestina Patches noted that they were not. I can refer him to a chronic pain management program if the patient is willing. Thanks.

## 2018-03-26 NOTE — Telephone Encounter (Signed)
Pt states he would like to proceed with RFA, pt had one Bil L4-5 facet on 11/28/17. Pt also said injection do help, but only lasts for about 2 months. Ok to proceed with repeat of Bil L4-5 facet?

## 2018-03-26 NOTE — Telephone Encounter (Signed)
His request for pain medicines have been made in the past and he needs to be referred to a pain management physician for that or he can talk to his primary care physician.  If the injections were not very helpful for any length of time he needs to consider radiofrequency ablation which she has had in the past he does not want to consider.

## 2018-03-26 NOTE — Telephone Encounter (Signed)
-----   Message from Magnus Sinning, MD sent at 03/26/2018  5:58 AM EDT ----- Randall Hiss,  His pain management has been a big issue as is he is mainly seen by Dr. Joni Fears in orthopedics.  He has been sent to Dr. Milinda Pointer in the past for pain management and did not like going there and only saw him on one occasion.  I do think he would be better looked after in a chronic pain management program which we are just not at this office.  He has some facet arthropathy and degenerative changes but no severe stenosis in his spine.  I think there are other issues going on with him in general and he has been looking for disability for a while.  Tramadol is about the only thing that I or you probably would do at this point rather than getting him into a pain management program.  He has been unwilling to discuss radiofrequency ablation for his lumbar spine which would be the next step given that he mainly has facet arthropathy.  I have spoken to him and his wife about this on numerous occasions.  ----- Message ----- From: [name], MD Sent: 03/25/2018  11:06 AM EDT To: Magnus Sinning, MD  Hi Dr Ernestina Patches,   I saw Mr Bialas for hospital follow-up this past week. He was requesting additional intervention or other medication for his chronic back pain. Tramadol is the limit of what I am going to prescribe for him and I waned to see if there was anything additional to be done for him regarding procedures or medication from your perspective. Thanks for your help.   Tommi Rumps

## 2018-03-26 NOTE — Telephone Encounter (Signed)
Notification or Prior Authorization is not required for the requested services  This [employer] plan does not currently require a prior authorization for [phone].  Decision ID #:[id number]

## 2018-03-26 NOTE — Telephone Encounter (Signed)
Yes need to do one more facet/Medial Branch block for double block per insurance and make sure we have notes from chiro or PT - this is crucial for him as he is hard to pin down on what he will do and has done for his back.

## 2018-03-27 NOTE — Telephone Encounter (Signed)
Called and spoke with pt. Pt advised and voiced understanding. Pt stated to hold off on referral for now to see pain management. Pt plans to still see Dr. Ernestina Patches since he has been seeing him for the past 12 years.   Sent to PCP as an [place]

## 2018-03-27 NOTE — Telephone Encounter (Signed)
Called and spoke with pt. Pt stated that he has NOT had the lab work done and does NOT plan to have it done anytime soon due to the situation with the coronavirus but once this passes pt is willing to do lab work.    Pt has not heard back from our office as of yet but will contact if he does not hear from anyone.

## 2018-03-30 ENCOUNTER — Other Ambulatory Visit: Payer: Self-pay | Admitting: Family Medicine

## 2018-03-30 DIAGNOSIS — E871 Hypo-osmolality and hyponatremia: Secondary | ICD-10-CM | POA: Diagnosis not present

## 2018-03-30 DIAGNOSIS — D696 Thrombocytopenia, unspecified: Secondary | ICD-10-CM | POA: Diagnosis not present

## 2018-03-30 NOTE — Telephone Encounter (Signed)
Last OV 03/20/2018  Last refilled 03/08/2018 disp 60 with no refills   Next appt none scheduled   Sent to PCP for approval

## 2018-03-31 LAB — CBC
Hematocrit: 36.2 % — ABNORMAL LOW (ref 37.5–51.0)
Hemoglobin: 13 g/dL (ref 13.0–17.7)
MCH: 39.3 pg — ABNORMAL HIGH (ref 26.6–33.0)
MCHC: 35.9 g/dL — ABNORMAL HIGH (ref 31.5–35.7)
MCV: 109 fL — ABNORMAL HIGH (ref 79–97)
Platelets: 101 10*3/uL — ABNORMAL LOW (ref 150–450)
RBC: 3.31 x10E6/uL — ABNORMAL LOW (ref 4.14–5.80)
RDW: 13.2 % (ref 11.6–15.4)
WBC: 9.3 10*3/uL (ref 3.4–10.8)

## 2018-03-31 LAB — BASIC METABOLIC PANEL WITH GFR
BUN/Creatinine Ratio: 18 (ref 10–24)
BUN: 16 mg/dL (ref 8–27)
CO2: 22 mmol/L (ref 20–29)
Calcium: 9.6 mg/dL (ref 8.6–10.2)
Chloride: 97 mmol/L (ref 96–106)
Creatinine, Ser: 0.91 mg/dL (ref 0.76–1.27)
GFR calc Af Amer: 105 mL/min/1.73
GFR calc non Af Amer: 91 mL/min/1.73
Glucose: 106 mg/dL — ABNORMAL HIGH (ref 65–99)
Potassium: 3.7 mmol/L (ref 3.5–5.2)
Sodium: 134 mmol/L (ref 134–144)

## 2018-04-02 NOTE — Telephone Encounter (Signed)
New Message  Pt returning call wanting to speak with Tonisha in regards to scheduling.

## 2018-04-03 NOTE — Telephone Encounter (Signed)
Called patient and let him know that we will call him to schedule after we have gotten all currently scheduled patients rescheduled.

## 2018-04-05 NOTE — Telephone Encounter (Signed)
Called pt and lvm

## 2018-04-18 NOTE — Telephone Encounter (Signed)
Pt is scheduled 05/30/2018.  Pt would like to have valium prior to injection 05/30/2018, pharmacy is updated. Pt would like to have tramadol to hold him up until injection. I have advised per what you stated in last message.

## 2018-04-18 NOTE — Telephone Encounter (Signed)
error 

## 2018-04-26 ENCOUNTER — Other Ambulatory Visit: Payer: Self-pay | Admitting: Family Medicine

## 2018-04-26 DIAGNOSIS — I10 Essential (primary) hypertension: Secondary | ICD-10-CM

## 2018-04-27 NOTE — Telephone Encounter (Signed)
Refilled: 04/04/2018 Last OV: 03/20/2018 Next OV: not scheduled

## 2018-04-30 ENCOUNTER — Telehealth (INDEPENDENT_AMBULATORY_CARE_PROVIDER_SITE_OTHER): Payer: Self-pay | Admitting: Orthopaedic Surgery

## 2018-04-30 NOTE — Telephone Encounter (Signed)
Patient left a message on voicemail requesting something for pain from Dr. Durward Fortes Dr. Ernestina Patches for his back. Patient scheduled an appt with Dr. Ernestina Patches for another injection for June. Per Dr. Ernestina Patches, patients GP is to manage pain rx's for patient ,and assistant advised patient of that when his appt with Dr. Ernestina Patches was made. Please call to advise.

## 2018-05-01 NOTE — Telephone Encounter (Signed)
Called patient. No answer. Left message on machine that he needed to contact his PCP for pain medicine because Dr.Whitfield would not prescribe that for him.

## 2018-05-01 NOTE — Telephone Encounter (Signed)
Medical doctor prescribed

## 2018-05-01 NOTE — Telephone Encounter (Signed)
See below. Please call patient and advise. Thank you.

## 2018-05-01 NOTE — Telephone Encounter (Signed)
Please advise 

## 2018-05-02 ENCOUNTER — Telehealth (INDEPENDENT_AMBULATORY_CARE_PROVIDER_SITE_OTHER): Payer: Self-pay | Admitting: Orthopaedic Surgery

## 2018-05-02 NOTE — Telephone Encounter (Signed)
Patient left a voicemail stating he received our message regarding pain medicine.  Patient states he has already checked with his "medical doctor and states that he is not authorized to prescribe anything stronger than Tramadol."  Patient states he is scheduled to see Dr. Ernestina Patches for back injection on 05/30/18, but "has severe back pain and can barely get out of bed."  Patient states "Dr. Durward Fortes prescribed Oxycodone when he had his shoulder operated on about a year and a half ago."  Patient states "he is a 30+ year patient of Dr. Durward Fortes" and "just wants to know if he could call something in or email me a hard copy that I can take to my pharmacy."  Patient states he is only requesting enough medicine to get him to May 20. Patient requested a return call.

## 2018-05-03 ENCOUNTER — Telehealth (INDEPENDENT_AMBULATORY_CARE_PROVIDER_SITE_OTHER): Payer: Self-pay | Admitting: Physical Medicine and Rehabilitation

## 2018-05-03 NOTE — Telephone Encounter (Signed)
Please advise. I have already called him this week to notify him that we would not be prescribing pain medicine, and Dr.Newton has told him the same thing.

## 2018-05-03 NOTE — Telephone Encounter (Signed)
Left message to advise. Asked patient to call back if he wants a prescription for hydrocodone.

## 2018-05-03 NOTE — Telephone Encounter (Signed)
Spoke to him.  Said Dr Ernestina Patches was going to call in norco for him

## 2018-05-03 NOTE — Telephone Encounter (Signed)
I too am not prescribing oxycodone, he may wish to get in a pain clinic. Is PCP can help with that. I will do small script of hydrocodone if he wishes. I have reviewed his images of his spine numerous times in my opinion does not warrant chronic oxycodone.

## 2018-05-04 MED ORDER — HYDROCODONE-ACETAMINOPHEN 5-325 MG PO TABS: 1.0000 | ORAL_TABLET | Freq: Three times a day (TID) | ORAL | 0 refills | Status: AC | PRN

## 2018-05-04 NOTE — Telephone Encounter (Signed)
Mr. Bertino has multiple issues with chronic pain.  He has called into his primary care physician who evidently has told him that he cannot prescribe anything more than tramadol and he has been getting tramadol 60 tablets a month with his last being on 04/28/2018.  The patient had a hospitalization in February for alcohol withdrawal.  We have had recent MRI.  He is a patient long-term with Dr. Durward Fortes who also said he was not going to prescribe any stronger narcotic.  The patient is on a waiting list because of the coronavirus pandemic to have an injection performed.  He has gotten some relief with the injection but not great.  In good faith I am going to give him 15 tablets of hydrocodone.  He carries an intolerance to extended release hydrocodone but this will be short acting so I do not think to be an issue.  I have already spoken with him through our staff that we will not prescribe anything stronger.  He has spondylosis and arthritic change at L4-5 with some left-sided disc protrusion.  If he needs stronger medication he will need to go to a pain clinic.  We are not a pain clinic here in the office.  He might do well with surgical discectomy and/or fusion at L4-5 and I think that something he needs to at least consider.  He also would likely need pain psychology for coping strategies but this is hard to come by but I think with his level of anxiety and other issues if he is not had psychological ongoing treatment that is something his primary care physician needs to consider.

## 2018-05-04 NOTE — Telephone Encounter (Signed)
See my documentation below.  I also sent a staff message to his primary care physician Dr. Tommi Rumps.  He will need to follow-up there for the medication is not helpful and we have not been able to do the injection yet.

## 2018-05-04 NOTE — Telephone Encounter (Signed)
Called patient to advise that prescription was sent to his pharmacy.

## 2018-05-04 NOTE — Telephone Encounter (Signed)
Patient would like a prescription for hydrocodone. [facility].

## 2018-05-10 DIAGNOSIS — J301 Allergic rhinitis due to pollen: Secondary | ICD-10-CM | POA: Diagnosis not present

## 2018-05-14 ENCOUNTER — Other Ambulatory Visit: Payer: Self-pay | Admitting: Neurology

## 2018-05-17 DIAGNOSIS — J301 Allergic rhinitis due to pollen: Secondary | ICD-10-CM | POA: Diagnosis not present

## 2018-05-24 ENCOUNTER — Other Ambulatory Visit: Payer: Self-pay | Admitting: Physical Medicine and Rehabilitation

## 2018-05-24 DIAGNOSIS — F411 Generalized anxiety disorder: Secondary | ICD-10-CM

## 2018-05-24 DIAGNOSIS — J301 Allergic rhinitis due to pollen: Secondary | ICD-10-CM | POA: Diagnosis not present

## 2018-05-24 MED ORDER — TRIAZOLAM 0.25 MG PO TABS: ORAL_TABLET | ORAL | 0 refills | Status: DC

## 2018-05-24 NOTE — Progress Notes (Signed)
Pre-procedure triazolam ordered for pre-operative anxiety.  

## 2018-05-24 NOTE — Telephone Encounter (Signed)
done

## 2018-05-30 ENCOUNTER — Ambulatory Visit: Payer: 59 | Admitting: Physical Medicine and Rehabilitation

## 2018-05-30 ENCOUNTER — Ambulatory Visit: Payer: 59

## 2018-05-30 ENCOUNTER — Encounter: Payer: Self-pay | Admitting: Physical Medicine and Rehabilitation

## 2018-05-30 ENCOUNTER — Other Ambulatory Visit: Payer: Self-pay

## 2018-05-30 VITALS — BP 180/92 | HR 60

## 2018-05-30 DIAGNOSIS — M47816 Spondylosis without myelopathy or radiculopathy, lumbar region: Secondary | ICD-10-CM | POA: Diagnosis not present

## 2018-05-30 DIAGNOSIS — M545 Low back pain, unspecified: Secondary | ICD-10-CM

## 2018-05-30 DIAGNOSIS — G8929 Other chronic pain: Secondary | ICD-10-CM | POA: Diagnosis not present

## 2018-05-30 DIAGNOSIS — G894 Chronic pain syndrome: Secondary | ICD-10-CM

## 2018-05-30 MED ORDER — BUPIVACAINE HCL 0.5 % IJ SOLN
3.0000 mL | Freq: Once | INTRAMUSCULAR | Status: AC
  Administered 2018-05-30: 3 mL

## 2018-05-30 MED ORDER — METHYLPREDNISOLONE ACETATE 80 MG/ML IJ SUSP
80.0000 mg | Freq: Once | INTRAMUSCULAR | Status: AC
  Administered 2018-05-30: 80 mg

## 2018-05-30 NOTE — Progress Notes (Signed)
Peter [name] - 63 y.o. male MRN [id number]  Date of birth: 04/24/1955  Office Visit Note: Visit Date: 05/30/2018 PCP: [name], MD Referred by: [name], MD  Subjective: Chief Complaint  Patient presents with   Lower Back - Pain   Right Leg - Pain   HPI:  Peter [name] is a 63 y.o. male who comes in today For planned second diagnostic facet block at L4-5.  Prior injection gave him quite a bit of relief more than 60% relief for a few weeks.  He says overall maybe even for a couple months it was somewhat better but the symptoms returned with a vengeance.  He did call in to Dr. Durward Fortes and myself asking for increased pain medications.  He also talked to his primary care physician.  I actually sent a message to Dr. Caryl Bis about the patient's case.  I did provide a small amount of hydrocodone which she said did help.  He says he does try to use this minimally.  There are documented issues in the chart with alcoholism including emergency department visit.  He has chronic low back pain with facet arthropathy with no nerve compression.  He has pain he says sometimes in the calf.  He reports he does not want surgery.  I did discuss with him that it does not seem like a surgical issue and the only surgery that likely will even be looked at would be a fusion surgery which I do not think he would do well with.  He currently is not really doing much in the way of exercise or activity do to the basic quarantining and social distancing.  I do think it is an issue where he is really an active at this point.  He states he has a high dollar exercise bike but he cannot do it because his knee hurts.  He says he tries to walk with his wife but cannot do that because his back hurts.  We are going to try medial branch blocks a second block paradigm with pain diary.  We will try to get radiofrequency ablation approved if this is beneficial.  Ablation may help him for a lot longer we do see  pain relief sometimes up to a year.  How much is going to help overall will be something we will have to follow.  He did ask me about pain management today and I did give him overall scope of the differences in different pain management approaches and I did indicate that all doctors are essentially pain management doctors and just a matter of was the best treatment course for the patient.  I did tell him there are pain management physicians through [facility] and even privately through [facility] some other folks.  Lastly patient's course is complicated by anticoagulation.  These procedures for the facet joints and now back transforaminal injections do not need discontinuation of the anticoagulation prior to the procedure.  The patient went ahead though and discontinued his Eliquis because he thought this was a good idea.  I told him in the past not to do that without specific instructions from [place].  He will return to taking that today.  ROS Otherwise per HPI.  Assessment & Plan: Visit Diagnoses:  1. Spondylosis without myelopathy or radiculopathy, lumbar region   2. Chronic bilateral low back pain without sciatica   3. Chronic pain syndrome     Plan: No additional findings.   Meds & Orders:  Meds ordered this encounter  Medications   bupivacaine (MARCAINE) 0.5 % (with pres) injection 3 mL   methylPREDNISolone acetate (DEPO-MEDROL) injection 80 mg    Orders Placed This Encounter  Procedures   Facet Injection   XR C-ARM NO REPORT    Follow-up: Return for Review Pain Diary.   Procedures: No procedures performed  Lumbar Diagnostic Facet Joint Nerve Block with Fluoroscopic Guidance   Patient: Peter [name]      Date of Birth: 05-31-1955 MRN: [id number] PCP: [name], MD      Visit Date: 05/30/2018   Universal Protocol:    Date/Time: 05/20/202:01 PM  Consent Given By: the patient  Position: PRONE  Additional Comments: Vital signs were monitored  before and after the procedure. Patient was prepped and draped in the usual sterile fashion. The correct patient, procedure, and site was verified.   Injection Procedure Details:  Procedure Site One Meds Administered:  Meds ordered this encounter  Medications   bupivacaine (MARCAINE) 0.5 % (with pres) injection 3 mL   methylPREDNISolone acetate (DEPO-MEDROL) injection 80 mg     Laterality: Bilateral  Location/Site:  L4-L5  Needle size: 22 ga.  Needle type:spinal  Needle Placement: Oblique pedical  Findings:   -Comments: There was excellent flow of contrast along the articular pillars without intravascular flow.  Procedure Details: The fluoroscope beam is vertically oriented in AP and then obliqued 15 to 20 degrees to the ipsilateral side of the desired nerve to achieve the Scotty dog appearance.  The skin over the target area of the junction of the superior articulating process and the transverse process (sacral ala if blocking the L5 dorsal rami) was locally anesthetized with a 1 ml volume of 1% Lidocaine without Epinephrine.  The spinal needle was inserted and advanced in a trajectory view down to the target.   After contact with periosteum and negative aspirate for blood and CSF, correct placement without intravascular or epidural spread was confirmed by injecting 0.5 ml. of Isovue-250.  A spot radiograph was obtained of this image.    Next, a 0.5 ml. volume of the injectate described above was injected. The needle was then redirected to the other facet joint nerves mentioned above if needed.  Prior to the procedure, the patient was given a Pain Diary which was completed for baseline measurements.  After the procedure, the patient rated their pain every 30 minutes and will continue rating at this frequency for a total of 5 hours.  The patient has been asked to complete the Diary and return to [place] by mail, fax or hand delivered as soon as possible.   Additional Comments:   The patient tolerated the procedure well Dressing: 2 x 2 sterile gauze and Band-Aid    Post-procedure details: Patient was observed during the procedure. Post-procedure instructions were reviewed.  Patient left the clinic in stable condition.   Clinical History: MRI LUMBAR SPINE WITHOUT CONTRAST  TECHNIQUE: Multiplanar, multisequence MR imaging of the lumbar spine was performed. No intravenous contrast was administered.  COMPARISON:  MRI lumbar spine 10/11/2014.  FINDINGS: Segmentation:  Standard.  Alignment:  Maintained.  Vertebrae: No fracture or worrisome lesion. Marrow signal is mildly heterogeneous with scattered degenerative endplate signal change appearing worst at L3-4.  Conus medullaris and cauda equina: Conus extends to the L1 level. Conus and cauda equina appear normal.  Paraspinal and other soft tissues: Negative.  Disc levels:  T11-12 is imaged in the sagittal plane only. There is a minimal disc bulge but  no central canal or foraminal narrowing.  T12-L1: Negative.  L1-2: Negative.  L2-3: Shallow disc bulge with some endplate spurring. There is mild central canal and foraminal narrowing. No nerve root compression. The appearance is not markedly changed.  L3-4: Shallow broad-based disc bulge is somewhat more prominent to the left. There is some narrowing in the left lateral recess. Mild to moderate foraminal narrowing is more notable on the left. Extraforaminal disc contacts the exited L3 roots. The appearance of this level is unchanged.  L4-5: The patient has a shallow disc bulge. More focally protruding disc within and beyond the left foramen is more prominent than on the prior exam and contacts the exiting and exited left L4 root. Moderate bilateral foraminal narrowing is seen. There is mild narrowing in the left lateral recess. Moderate facet arthropathy is noted.  L5-S1: Shallow disc bulge without  stenosis.  IMPRESSION: Progressive degenerative disease at L4-5 where a disc bulge eccentrically prominent to the left and more focally protruding disc within and beyond the left foramen has increased in size. Disc contacts the exiting and exited left L4 root. There is also some narrowing in the left lateral recess at this level. The appearance lumbar spine is otherwise unchanged.  Extraforaminal disc contacts the exited L3 roots bilaterally at L3-4, unchanged.  Mild central canal and bilateral foraminal narrowing at L2-3, unchanged.   Electronically Signed   By: Inge Rise M.D.   On: 04/11/2017 20:33     Objective:  VS:  HT:     WT:    BMI:      BP:(!) 180/92   HR:60bpm   TEMP: ( )   RESP:  Physical Exam  Ortho Exam Imaging: Xr C-arm No Report  Result Date: 05/30/2018 Please see Notes tab for imaging impression.

## 2018-05-30 NOTE — Procedures (Signed)
Lumbar Diagnostic Facet Joint Nerve Block with Fluoroscopic Guidance   Patient: Peter [name]      Date of Birth: 1955/08/10 MRN: [id number] PCP: [name], MD      Visit Date: 05/30/2018   Universal Protocol:    Date/Time: 05/20/202:01 PM  Consent Given By: the patient  Position: PRONE  Additional Comments: Vital signs were monitored before and after the procedure. Patient was prepped and draped in the usual sterile fashion. The correct patient, procedure, and site was verified.   Injection Procedure Details:  Procedure Site One Meds Administered:  Meds ordered this encounter  Medications  . bupivacaine (MARCAINE) 0.5 % (with pres) injection 3 mL  . methylPREDNISolone acetate (DEPO-MEDROL) injection 80 mg     Laterality: Bilateral  Location/Site:  L4-L5  Needle size: 22 ga.  Needle type:spinal  Needle Placement: Oblique pedical  Findings:   -Comments: There was excellent flow of contrast along the articular pillars without intravascular flow.  Procedure Details: The fluoroscope beam is vertically oriented in AP and then obliqued 15 to 20 degrees to the ipsilateral side of the desired nerve to achieve the "Scotty dog" appearance.  The skin over the target area of the junction of the superior articulating process and the transverse process (sacral ala if blocking the L5 dorsal rami) was locally anesthetized with a 1 ml volume of 1% Lidocaine without Epinephrine.  The spinal needle was inserted and advanced in a trajectory view down to the target.   After contact with periosteum and negative aspirate for blood and CSF, correct placement without intravascular or epidural spread was confirmed by injecting 0.5 ml. of Isovue-250.  A spot radiograph was obtained of this image.    Next, a 0.5 ml. volume of the injectate described above was injected. The needle was then redirected to the other facet joint nerves mentioned above if needed.  Prior to the procedure,  the patient was given a Pain Diary which was completed for baseline measurements.  After the procedure, the patient rated their pain every 30 minutes and will continue rating at this frequency for a total of 5 hours.  The patient has been asked to complete the Diary and return to [place] by mail, fax or hand delivered as soon as possible.   Additional Comments:  The patient tolerated the procedure well Dressing: 2 x 2 sterile gauze and Band-Aid    Post-procedure details: Patient was observed during the procedure. Post-procedure instructions were reviewed.  Patient left the clinic in stable condition.

## 2018-05-30 NOTE — Progress Notes (Signed)
 .  Numeric Pain Rating Scale and Functional Assessment Average Pain 7   In the last MONTH (on 0-10 scale) has pain interfered with the following?  1. General activity like being  able to carry out your everyday physical activities such as walking, climbing stairs, carrying groceries, or moving a chair?  Rating(6)   +Driver, +BT(eliquis, ok for injection), -Dye Allergies.

## 2018-05-31 DIAGNOSIS — J301 Allergic rhinitis due to pollen: Secondary | ICD-10-CM | POA: Diagnosis not present

## 2018-06-08 DIAGNOSIS — J301 Allergic rhinitis due to pollen: Secondary | ICD-10-CM | POA: Diagnosis not present

## 2018-06-09 ENCOUNTER — Telehealth: Payer: Self-pay | Admitting: Internal Medicine

## 2018-06-09 DIAGNOSIS — Z20822 Contact with and (suspected) exposure to covid-19: Secondary | ICD-10-CM

## 2018-06-09 NOTE — Telephone Encounter (Signed)
Pt. returned call.  Advised that he was potentially exposed to someone at the Orthopedic office, recently. Offered an appt. For testing for COVID 19.  Pt. Accepted an appt. On 06/11/18 @ 2:00 PM.  Verb. Understanding.

## 2018-06-09 NOTE — Telephone Encounter (Signed)
Left message for patient to return call regarding possible exposure to Covid-19 virus at office visit at [facility] 05/30/18 to [phone].

## 2018-06-09 NOTE — Addendum Note (Signed)
Addended by: Denman George on: 06/09/2018 12:10 PM   Modules accepted: Orders

## 2018-06-11 ENCOUNTER — Other Ambulatory Visit: Payer: 59

## 2018-06-11 ENCOUNTER — Other Ambulatory Visit: Payer: Self-pay | Admitting: Hematology

## 2018-06-11 DIAGNOSIS — Z20822 Contact with and (suspected) exposure to covid-19: Secondary | ICD-10-CM

## 2018-06-11 NOTE — Progress Notes (Signed)
COVID screen released in error.  Screening has been reordered.

## 2018-06-12 LAB — NOVEL CORONAVIRUS, NAA: SARS-CoV-2, NAA: NOT DETECTED

## 2018-06-13 ENCOUNTER — Telehealth: Payer: Self-pay | Admitting: Cardiovascular Disease

## 2018-06-13 ENCOUNTER — Telehealth: Payer: Self-pay | Admitting: *Deleted

## 2018-06-13 ENCOUNTER — Other Ambulatory Visit: Payer: Self-pay | Admitting: Family Medicine

## 2018-06-13 NOTE — Telephone Encounter (Signed)
Pt called to report that he has been taking his Cradura 8mg  bid and his BO in the morning has been fine but his afternoon B around 2-3pm has been elevated and he develops a headache..   152/72 160/80 198/77 202/75 182/70 154/77  Heart rates have been 70-90.   He says his next dose of Cardura is not usually until 6pm.. His PCP started him on HCTZ 12.5mg  in the morning and it help but he sometime will take it in the afternoon also and it brings it back down.   He denies chest pain, palpitations.Marland Kitchen   He reports that he has been having a lot of anxiety watching the news about the riots/ protesting... he has been upset. His PCP started him on Buspar 10mg  tid and he feels well but thinks it is still affecting his BP.   Will forward to Dr. Rockey Situ for review... pt has OV 06/25/18 but requesting "in person".... he is really reluctant for a virtual visit.  Last BMET 03/30/18.

## 2018-06-13 NOTE — Telephone Encounter (Signed)
Notification or Prior Authorization is not required for the requested services  This [employer] plan does not currently require a prior authorization for [phone]  Decision ID #:[id number]

## 2018-06-13 NOTE — Telephone Encounter (Signed)
STAT if HR is under 50 or over 120 (normal HR is 60-100 beats per minute)  1) What is your heart rate? 75-90, usually in the 60s  2) Do you have a log of your heart rate readings (document readings)?   3) Do you have any other symptoms? States that he has noticed his HR and BP has been different lately.  BP has been somewhat high at 160/80, 190/85.  The medicine does help to lower but then gets a headache.   Would like to know if he can come in sooner, requesting a possible nurse visit for an EKG Patient is scheduled for an appt with Dr Rockey Situ on 6/15 -advise if it should be virtual or in office  Please call to discuss and advise on appt. Ok to leave a detailed message

## 2018-06-15 ENCOUNTER — Telehealth: Payer: Self-pay

## 2018-06-15 NOTE — Telephone Encounter (Signed)
Sent message to provider to see if I could add on for virtual call today.

## 2018-06-15 NOTE — Telephone Encounter (Signed)
Spoke with patient.  Made him aware that Dr. Rockey Situ is limiting patients coming into the office due to [place].  Offered Telehealth appointment.   Patient stated that he really needs an EKG. He stated his HR has been crazy as well as his BP.  He stated that he didn't mine Dr. Rockey Situ giving him a call but he would like to see someone about getting an EKG dto to make sure everything looks good.   Made him aware that I would make someone aware and to be expecting a call back.

## 2018-06-15 NOTE — Telephone Encounter (Signed)
2 encounters in chart. Secure message sent to provider to see if I could add on today for virtual call

## 2018-06-15 NOTE — Telephone Encounter (Signed)
Spoke with patient and he reports that he stopped Bystolic and that he went to PCP and they added HCTZ. He states that since last call his blood pressures have been pretty good except in the early morning when he gets up. This morning it was 181/135 HR 91 and after medications 123/51 HR 68. Last night readings were good as well. He reports checking it frequently and reviewed that he needs to make sure to take 2 hours after medications and to make sure when he is checking in the morning that he sits for 10 minutes with no talking and then check it. Two other readings from yesterday were 144/73 HR 72 and 136/66 HR 73. Confirmed his upcoming appointment with provider in office because he wants to make sure he is not back in afib. Reviewed that current heart rates do not indicate that but reviewed signs and symptoms to monitor for. Advised that Dr. Rockey Situ is aware and will review for any further recommendations.   He did want to mention that last month he went in for a back shot and said he was exposed to [place]. He was tested and has been negative. He has not had any symptoms since then.

## 2018-06-15 NOTE — Telephone Encounter (Signed)
Secure chat message sent to provider to review call and appointment information for this patient. He will look at later time to review all of his information.

## 2018-06-15 NOTE — Telephone Encounter (Signed)
Spoke with patient and confirmed upcoming appointment information. He would like to keep that appointment so he can see provider and make sure his is not back in afib. He had no further questions and see other telephone encounter for blood pressures and note sent to provider.

## 2018-06-15 NOTE — Telephone Encounter (Signed)
2 different encounters. Left voicemail for patient to call back.

## 2018-06-15 NOTE — Telephone Encounter (Signed)
Left voicemail message that I was calling to follow up and review for appointment. Dr. Rockey Situ aware of message and will review later.

## 2018-06-17 NOTE — Telephone Encounter (Signed)
Thanks Pam! Very important note you made about him not taking the bystolic. That was not mentioned on the initial triage call.  Good pick up! It changes everything High 5!

## 2018-06-18 ENCOUNTER — Other Ambulatory Visit: Payer: Self-pay | Admitting: Family Medicine

## 2018-06-18 ENCOUNTER — Other Ambulatory Visit: Payer: Self-pay | Admitting: Neurology

## 2018-06-21 ENCOUNTER — Other Ambulatory Visit: Payer: Self-pay | Admitting: Physical Medicine and Rehabilitation

## 2018-06-21 NOTE — Telephone Encounter (Signed)
Please advise 

## 2018-06-22 ENCOUNTER — Telehealth: Payer: Self-pay

## 2018-06-22 NOTE — Telephone Encounter (Signed)

## 2018-06-22 NOTE — Telephone Encounter (Signed)
He got impatient and had Pharmacy send request, so this is done

## 2018-06-22 NOTE — Telephone Encounter (Signed)
Called patient to review COVID SCREEN questions.  No answer. LMOV. Awaiting call back.

## 2018-06-24 NOTE — Progress Notes (Signed)
Cardiology Office Note  Date:  06/25/2018   ID:  Peter [name], DOB 1955/04/18, MRN [id number]  PCP:  [name], MD   Chief Complaint  Patient presents with  . Other    Patient c/o blood pressure and HR issue. Meds reviewed verbally with patient.     HPI:  [name] is a 63 year old gentleman with  long history of smoking, 3 cigs a day hypertension,  daily alcohol drinking,  prostate cancer ,  statin intolerance ,    underlying liver dysfunction, elevated total bilirubin CAD, CT coronary calcium score 2778 in 04/2015 Stress test 07/2014 no ischemia Cardioversion 11/02/2015 For atrial flutter tolerating praluent , dramatic improvement in his cholesterol down to 160 from 260 who presents for follow-up of his coronary artery disease, hypertension, hyperlipidemia  Severe back pain,  Was unable to get cortisone shot for quite some time Only recently had a cortisone Has not been exercising, weight trending upwards, eating more  Taking HCTZ 12.5 daily Reports that he stopped his bystolic Sometimes is checking his blood pressure before taking Cardura Was previously taking this twice a day now only takes it once at nighttime Blood pressures at home typically 382 or higher systolic  Denies any chest tightness, shortness of breath No significant leg swelling  Down to several cigarettes a day Previous esophageal dilatation with Dr. [name]  Chronic tremor  LDL 56, total chol 147  EKG personally reviewed by myself on todays visit Shows normal sinus rhythm rate 85 bpm no significant ST or T-wave changes Other past medical history reviewed Prior Chest tightness, Went to the ER 08/26/2017 TNT 0.05  Other past medical history reviewed Previously passed out, hit his face on the left Was in the hospital, blood thinners held overnight 01/19/2017  anticoagulation held until platelets improved  hemoglobin of 9 down from  13    Echo January 2019  EF normal >65%  Previous  EGD with Dr. [name],  Essentially normal procedure, no sign of bleeding  echocardiogram 02/2016 showing normal LV function  Chronic back pain He went to the ER for back pain 11/22/2015 Started On tramadol PRN  Limited by back pain , was told he could get a "shot" "shaking" for 2 years since prostate seeds, stopped librium Still with heavy ETOH, previous notes indicating 4-5 beers per day Frequent nocturia Has been taking lisinopril 40 mg x 2, presumably because blood pressure was running high, now running out of his medication. Did not call our office  Previous stress test July 2016 showing no ischemia CT scan of the chest, CT coronary calcium score discussed with him in detail, images pulled up in the office. His scores close to 3000. He denies any anginal symptoms. Images show diffuse heavy calcification/calcified atherosclerosis in the LAD, diagonal branch, ostial and proximal RCA. No significant disease noted in the left circumflex.   he reports being debilitated when he was on Crestor, Lipitor He had diffuse muscle pain in his legs, has tried different doses, does not want to retry a statin given the debilitating side effects. Feels it made it impossible to get around, on top of his arthritis, severe knee pain, it was just too much  CT scan of the abdomen shows scattered diffuse mild aortic atherosclerosis.  Lab work reviewed with him showing hemoglobin A1c 5.5 , total cholesterol more than 230   PMH:   has a past medical history of A-fib ([place]), Abnormality of gait (09/03/2012), Alcohol abuse, Anxiety, Arthritis, Arthritis, Esophageal stricture, GERD (  gastroesophageal reflux disease), Gout, Hyperlipidemia, Hypertension, Internal hemorrhoids, Lesion of ulnar nerve (09/03/2012), Murmur, cardiac, PAF (paroxysmal atrial fibrillation) ([place]), Palpitations, Prostate cancer ([place]) (2016), Seizures ([place]), and Tubular adenoma of colon (03/2008).  PSH:    Past Surgical History:   Procedure Laterality Date  . CATARACT EXTRACTION W/PHACO Right 07/03/2014   Procedure: CATARACT EXTRACTION PHACO AND INTRAOCULAR LENS PLACEMENT (IOC);  Surgeon: Lyla Glassing, MD;  Location: [facility] ORS;  Service: Ophthalmology;  Laterality: Right;  lot # [id number] H [place]: 00:32.3 AP% 9.1 CDE: 2.92  . CIRCUMCISION    . COLONOSCOPY    . ELBOW SURGERY    . ELECTROPHYSIOLOGIC STUDY N/A 11/02/2015   Procedure: CARDIOVERSION;  Surgeon: Minna Merritts, MD;  Location: [facility] ORS;  Service: Cardiovascular;  Laterality: N/A;  . ESOPHAGOGASTRODUODENOSCOPY (EGD) WITH PROPOFOL N/A 05/17/2016   Procedure: ESOPHAGOGASTRODUODENOSCOPY (EGD) WITH PROPOFOL;  Surgeon: Lucilla Lame, MD;  Location: [facility] ENDOSCOPY;  Service: Endoscopy;  Laterality: N/A;  . EYE SURGERY    . HAND SURGERY Left   . SHOULDER ARTHROSCOPY Left   . SHOULDER SURGERY      Current Outpatient Medications  Medication Sig Dispense Refill  . albuterol (PROVENTIL HFA;VENTOLIN HFA) 108 (90 Base) MCG/ACT inhaler Inhale 2 puffs into the lungs every 6 (six) hours as needed for wheezing or shortness of breath. 1 Inhaler 0  . Alirocumab (PRALUENT) 150 MG/ML SOAJ Inject 1 pen into the skin every 14 (fourteen) days. 2 pen 11  . apixaban (ELIQUIS) 5 MG TABS tablet Take 1 tablet (5 mg total) by mouth 2 (two) times daily. 180 tablet 1  . aspirin 81 MG chewable tablet Chew 1 tablet (81 mg total) by mouth daily. 30 tablet 0  . B Complex-C (B-COMPLEX WITH VITAMIN C) tablet Take 1 tablet by mouth daily.    . busPIRone (BUSPAR) 10 MG tablet TAKE ONE TABLET BY MOUTH THREE TIMES A DAY 90 tablet 0  . doxazosin (CARDURA) 8 MG tablet Take 1 tablet (8 mg total) by mouth 2 (two) times daily. 180 tablet 3  . FOLIC ACID PO Take by mouth daily.    . hydrochlorothiazide (HYDRODIURIL) 12.5 MG tablet TAKE ONE TABLET BY MOUTH EVERY MORNING FOR BLOOD PRESSURE 90 tablet 1  . Melatonin 10 MG TABS Take 1 tablet by mouth at bedtime.    . milk thistle 175 MG tablet Take 175 mg by  mouth daily.    . Multiple Vitamin (MULTIVITAMIN) tablet Take 1 tablet by mouth daily. Centrum Plus -Take 1 daily    . nitroGLYCERIN (NITROSTAT) 0.4 MG SL tablet Place 1 tablet (0.4 mg total) under the tongue every 5 (five) minutes as needed for chest pain. 25 tablet 3  . traMADol (ULTRAM) 50 MG tablet TAKE ONE TABLET BY MOUTH EVERY 12 HOURS AS NEEDED FOR MODERATE PAIN 60 tablet 0  . triamcinolone cream (KENALOG) 0.1 % Apply 1 application topically daily as needed.     . triazolam (HALCION) 0.25 MG tablet TAKE 1 TABLET BY MOUTH 1 HOUR PRIOR TO PROCEDURE. TAKE WITH LIGHT FOOD. DO NOT DRIVE MOTOR VEHICLE. 2 tablet 0  . VOLTAREN 1 % GEL Apply 2 g topically as needed (knees).      No current facility-administered medications for this visit.      Allergies:   Hydrocodone bitartrate er, Clonazepam, Crestor [rosuvastatin], Dust mite extract, Gabapentin, Lipitor [atorvastatin], Lisinopril, and Topamax [topiramate]   Social History:  The patient  reports that he has been smoking cigarettes. He has a 2.40 pack-year smoking history. He has  never used smokeless tobacco. He reports current alcohol use. He reports that he does not use drugs.   Family History:   family history includes Diabetes in his father; Hyperlipidemia in his father; Hypertension in his father and mother; Kidney disease in his father; Lung cancer in his father and mother.    Review of Systems: Review of Systems  Constitutional: Negative.   HENT: Negative.   Respiratory: Negative.   Cardiovascular: Negative.   Gastrointestinal: Negative.   Musculoskeletal: Negative.   Psychiatric/Behavioral: The patient is nervous/anxious and has insomnia.   All other systems reviewed and are negative.    PHYSICAL EXAM: VS:  BP (!) 174/80 (BP Location: Left Arm, Patient Position: Sitting, Cuff Size: Normal)   Pulse 85   Ht 6\' 1"  (1.854 m)   Wt 268 lb 12 oz (121.9 kg)   BMI 35.46 kg/m  , BMI Body mass index is 35.46 kg/m.  No significant  change compared to previous visit Constitutional:  oriented to person, place, and time. No distress.  HENT:  Head: Normocephalic and atraumatic.  Eyes:  no discharge. No scleral icterus.  Neck: Normal range of motion. Neck supple. No JVD present.  Cardiac: RRR; 1-2+ SEM RSB murmurs, rubs, or gallops, No edema No murmur heard. Pulmonary/Chest: Effort normal and breath sounds normal. No stridor. No respiratory distress.  no wheezes.  no rales.  no tenderness.  Abdominal: Soft.  no distension.  no tenderness.  Musculoskeletal: Normal range of motion.  no  tenderness or deformity.  Neurological:  normal muscle tone. Coordination normal. No atrophy Skin: Skin is warm and dry. No rash noted. not diaphoretic.  Psychiatric:  normal mood and affect. behavior is normal. Thought content normal.   Recent Labs: 01/25/2018: TSH 0.980 03/07/2018: ALT 40 03/10/2018: Magnesium 1.5 03/30/2018: BUN 16; Creatinine, Ser 0.91; Hemoglobin 13.0; Platelets 101; Potassium 3.7; Sodium 134    Lipid Panel Lab Results  Component Value Date   CHOL 147 09/18/2017   HDL 65 09/18/2017   LDLCALC 56 09/18/2017   TRIG 132 09/18/2017      Wt Readings from Last 3 Encounters:  06/25/18 268 lb 12 oz (121.9 kg)  03/20/18 254 lb (115.2 kg)  03/20/18 254 lb 12.8 oz (115.6 kg)      ASSESSMENT AND PLAN:   Essential hypertension - Plan: EKG 12-Lead Long discussion concerning his blood pressure Will avoid clonidine given symptoms of fatigue  Previous history of leg edema, will avoid calcium channel blockers Did not tolerate isosorbide in the past Off lisinopril given his reported symptoms of leg swelling Step 1 Please take cardura 4 mg twice a day With HCTZ 12.5 mg daily Step 2 If blood pressure runs high, Start bystolic 1/2 (20 mg cut in 1/2 ) daily Step 3 If rate runs high, we may need to increase the bystolic up to 20 mg daily (whole pill)  Pure hypercholesterolemia - Plan: EKG 12-Lead Tolerating   praluent Goal LDL less than 70  Lipids discussed with him in detail, at goal  Typical atrial flutter ([place]) - Plan: EKG 12-Lead Maintaining normal sinus rhythm  Continue anticoagulation Restart beta-blocker as detailed above  Coronary artery disease involving native coronary artery of native heart without angina pectoris - Plan: EKG 12-Lead Recent episodes of chest pain Likely secondary to GI etiology.  At rest Nitro given for possible esophageal spasm  Anxiety/fatigue/depression insomnia Previously was on Ambien History of alcohol abuse  Alcohol use Not discussed with him today Chronic tremor, affecting his sleep  Smoke  down to 3 Cigarettes per day He does not want Chantix    Total encounter time more than 25 minutes  Greater than 50% was spent in counseling and coordination of care with the patient   Disposition:   F/U  6 months Recommended he call with blood pressure measurements    Orders Placed This Encounter  Procedures  . EKG 12-Lead     Signed, Esmond Plants, M.D., Ph.D. 06/25/2018  [place], [place]

## 2018-06-25 ENCOUNTER — Ambulatory Visit (INDEPENDENT_AMBULATORY_CARE_PROVIDER_SITE_OTHER): Payer: Medicare Other | Admitting: Cardiovascular Disease

## 2018-06-25 ENCOUNTER — Encounter: Payer: Self-pay | Admitting: Cardiovascular Disease

## 2018-06-25 ENCOUNTER — Other Ambulatory Visit: Payer: Self-pay

## 2018-06-25 VITALS — BP 174/80 | HR 85 | Ht 73.0 in | Wt 268.8 lb

## 2018-06-25 DIAGNOSIS — I10 Essential (primary) hypertension: Secondary | ICD-10-CM | POA: Diagnosis not present

## 2018-06-25 DIAGNOSIS — I483 Typical atrial flutter: Secondary | ICD-10-CM | POA: Diagnosis not present

## 2018-06-25 DIAGNOSIS — I700 Atherosclerosis of aorta: Secondary | ICD-10-CM | POA: Diagnosis not present

## 2018-06-25 DIAGNOSIS — I872 Venous insufficiency (chronic) (peripheral): Secondary | ICD-10-CM

## 2018-06-25 DIAGNOSIS — E7800 Pure hypercholesterolemia, unspecified: Secondary | ICD-10-CM

## 2018-06-25 DIAGNOSIS — Z720 Tobacco use: Secondary | ICD-10-CM | POA: Diagnosis not present

## 2018-06-25 DIAGNOSIS — I25118 Atherosclerotic heart disease of native coronary artery with other forms of angina pectoris: Secondary | ICD-10-CM

## 2018-06-25 NOTE — Patient Instructions (Addendum)
Medication Instructions:  Step 1 Please take cardura 4 mg twice a day With HCTZ 12.5 mg daily  Step 2 If blood pressure runs high, Start bystolic 1/2 (20 mg cut in 1/2 ) daily  Step 3 If rate runs high, we may need to increase the bystolic up to 20 mg daily (whole pill)  Monitor BP and pulse   If you need a refill on your cardiac medications before your next appointment, please call your pharmacy.    Lab work: No new labs needed   If you have labs (blood work) drawn today and your tests are completely normal, you will receive your results only by: Marland Kitchen MyChart Message (if you have MyChart) OR . A paper copy in the mail If you have any lab test that is abnormal or we need to change your treatment, we will call you to review the results.   Testing/Procedures: No new testing needed   Follow-Up: At [facility], you and your health needs are our priority.  As part of our continuing mission to provide you with exceptional heart care, we have created designated Provider Care Teams.  These Care Teams include your primary Cardiologist (physician) and Advanced Practice Providers (APPs -  Physician Assistants and Nurse Practitioners) who all work together to provide you with the care you need, when you need it.  . You will need a follow up appointment in 6 months .   Please call our office 2 months in advance to schedule this appointment.    . Providers on your designated Care Team:   . Murray Hodgkins, NP . Christell Faith, PA-C . Marrianne Mood, PA-C  Any Other Special Instructions Will Be Listed Below (If Applicable).  For educational health videos Log in to : [email or web address] Or : [email or web address], password : [email or web address]   How to Take Your Blood Pressure You can take your blood pressure at home with a machine. You may need to check your blood pressure at home:  To check if you have high blood pressure (hypertension).  To check your blood pressure over time.  To make sure your  blood pressure medicine is working. Supplies needed: You will need a blood pressure machine, or monitor. You can buy one at a drugstore or online. When choosing one:  Choose one with an arm cuff.  Choose one that wraps around your upper arm. Only one finger should fit between your arm and the cuff.  Do not choose one that measures your blood pressure from your wrist or finger. Your doctor can suggest a monitor. How to prepare Avoid these things for 30 minutes before checking your blood pressure:  Drinking caffeine.  Drinking alcohol.  Eating.  Smoking.  Exercising. Five minutes before checking your blood pressure:  Pee.  Sit in a dining chair. Avoid sitting in a soft couch or armchair.  Be quiet. Do not talk. How to take your blood pressure Follow the instructions that came with your machine. If you have a digital blood pressure monitor, these may be the instructions: 1. Sit up straight. 2. Place your feet on the floor. Do not cross your ankles or legs. 3. Rest your left arm at the level of your heart. You may rest it on a table, desk, or chair. 4. Pull up your shirt sleeve. 5. Wrap the blood pressure cuff around the upper part of your left arm. The cuff should be 1 inch (2.5 cm) above your elbow. It is best to wrap the cuff around bare skin.  6. Fit the cuff snugly around your arm. You should be able to place only one finger between the cuff and your arm. 7. Put the cord inside the groove of your elbow. 8. Press the power button. 9. Sit quietly while the cuff fills with air and loses air. 10. Write down the numbers on the screen. 11. Wait 2-3 minutes and then repeat steps 1-10. What do the numbers mean? Two numbers make up your blood pressure. The first number is called systolic pressure. The second is called diastolic pressure. An example of a blood pressure reading is "120 over 80" (or 120/80). If you are an adult and do not have a medical condition, use this guide to  find out if your blood pressure is normal: Normal  First number: below 120.  Second number: below 80. Elevated  First number: 120-129.  Second number: below 80. Hypertension stage 1  First number: 130-139.  Second number: 80-89. Hypertension stage 2  First number: 140 or above.  Second number: 53 or above. Your blood pressure is above normal even if only the top or bottom number is above normal. Follow these instructions at home:  Check your blood pressure as often as your doctor tells you to.  Take your monitor to your next doctor's appointment. Your doctor will: ? Make sure you are using it correctly. ? Make sure it is working right.  Make sure you understand what your blood pressure numbers should be.  Tell your doctor if your medicines are causing side effects. Contact a doctor if:  Your blood pressure keeps being high. Get help right away if:  Your first blood pressure number is higher than 180.  Your second blood pressure number is higher than 120. This information is not intended to replace advice given to you by your health care provider. Make sure you discuss any questions you have with your health care provider. Document Released: 12/10/2007 Document Revised: 11/25/2015 Document Reviewed: 06/05/2015 Elsevier Interactive Patient Education  2019 [employer] Inc[employer]  Blood Pressure Record Sheet To take your blood pressure, you will need a blood pressure machine. You can buy a blood pressure machine (blood pressure monitor) at your clinic, drug store, or online. When choosing one, consider:  An automatic monitor that has an arm cuff.  A cuff that wraps snugly around your upper arm. You should be able to fit only one finger between your arm and the cuff.  A device that stores blood pressure reading results.  Do not choose a monitor that measures your blood pressure from your wrist or finger. Follow your health care provider's instructions for how to take your  blood pressure. To use this form:  Get one reading in the morning (a.m.) before you take any medicines.  Get one reading in the evening (p.m.) before supper.  Take at least 2 readings with each blood pressure check. This makes sure the results are correct. Wait 1-2 minutes between measurements.  Write down the results in the spaces on this form.  Repeat this once a week, or as told by your health care provider.  Make a follow-up appointment with your health care provider to discuss the results. Blood pressure log Date: _______________________  a.m. _____________________(1st reading) _____________________(2nd reading)  p.m. _____________________(1st reading) _____________________(2nd reading) Date: _______________________  a.m. _____________________(1st reading) _____________________(2nd reading)  p.m. _____________________(1st reading) _____________________(2nd reading) Date: _______________________  a.m. _____________________(1st reading) _____________________(2nd reading)  p.m. _____________________(1st reading) _____________________(2nd reading) Date: _______________________  a.m. _____________________(1st reading) _____________________(2nd reading)  p.m. _____________________(1st  reading) _____________________(2nd reading) Date: _______________________  a.m. _____________________(1st reading) _____________________(2nd reading)  p.m. _____________________(1st reading) _____________________(2nd reading) This information is not intended to replace advice given to you by your health care provider. Make sure you discuss any questions you have with your health care provider. Document Released: 09/25/2002 Document Revised: 12/27/2016 Document Reviewed: 12/27/2016 Elsevier Interactive Patient Education  2019 [employer].

## 2018-07-04 ENCOUNTER — Ambulatory Visit: Payer: Self-pay

## 2018-07-04 ENCOUNTER — Ambulatory Visit (INDEPENDENT_AMBULATORY_CARE_PROVIDER_SITE_OTHER): Payer: Medicare Other | Admitting: Physical Medicine and Rehabilitation

## 2018-07-04 ENCOUNTER — Other Ambulatory Visit: Payer: Self-pay

## 2018-07-04 ENCOUNTER — Encounter: Payer: Self-pay | Admitting: Physical Medicine and Rehabilitation

## 2018-07-04 VITALS — BP 198/90 | HR 91

## 2018-07-04 DIAGNOSIS — M47816 Spondylosis without myelopathy or radiculopathy, lumbar region: Secondary | ICD-10-CM

## 2018-07-04 MED ORDER — METHYLPREDNISOLONE ACETATE 80 MG/ML IJ SUSP: 80.0000 mg | Freq: Once | INTRAMUSCULAR | Status: DC

## 2018-07-04 NOTE — Progress Notes (Signed)
 .  Numeric Pain Rating Scale and Functional Assessment Average Pain 9   In the last MONTH (on 0-10 scale) has pain interfered with the following?  1. General activity like being  able to carry out your everyday physical activities such as walking, climbing stairs, carrying groceries, or moving a chair?  Rating(9)   +Driver, -BT, -Dye Allergies.  

## 2018-07-12 NOTE — Progress Notes (Signed)
Peter [name] - 63 y.o. male MRN [id number]  Date of birth: Dec 26, 1955  Office Visit Note: Visit Date: 07/04/2018 PCP: [name], MD Referred by: [name], MD  Subjective: Chief Complaint  Patient presents with  . Lower Back - Pain   HPI:  Peter [name] is a 63 y.o. male who comes in today For planned bilateral L4-5 facet joint radiofrequency ablation.  This would be ablation of the L3 and L4 medial branches to effectively denervate the L4-5 facet joints bilaterally.  This is been a long road for [name] he has had chronic back pain none remittent with any sort of treatment other than injection for many years now.  He has some other chronic issues that are well-documented through the charts.  He has been requesting stronger pain medications and despite physical therapy and updated imaging showing some progressive arthropathy at L4-5 he is just not gotten much relief with any of it.  He has no high-grade stenosis he has no focal nerve compression.  He has had double diagnostic medial branch blocks.  He has a history of prior intra-articular injections of the facet joints which were beneficial.  He is not found relief with other medications including opioid medications.  He has had some significant weight gain and feels like that is from being an active tear in the coronavirus and because his back hurts he cannot exercise.  He tells me that he eats very well and watches his diet meticulously.  ROS Otherwise per HPI.  Assessment & Plan: Visit Diagnoses:  1. Spondylosis without myelopathy or radiculopathy, lumbar region     Plan: No additional findings.   Meds & Orders:  Meds ordered this encounter  Medications  . methylPREDNISolone acetate (DEPO-MEDROL) injection 80 mg    Orders Placed This Encounter  Procedures  . Radiofrequency,Lumbar  . XR C-ARM NO REPORT    Follow-up: No follow-ups on file.   Procedures: No procedures performed  Lumbar Facet Joint  Nerve Denervation  Patient: Peter [name]      Date of Birth: 1955-09-01 MRN: [id number] PCP: [name], MD      Visit Date: 07/04/2018   Universal Protocol:    Date/Time: 07/11/2008:27 PM  Consent Given By: the patient  Position: PRONE  Additional Comments: Vital signs were monitored before and after the procedure. Patient was prepped and draped in the usual sterile fashion. The correct patient, procedure, and site was verified.   Injection Procedure Details:  Procedure Site One Meds Administered:  Meds ordered this encounter  Medications  . methylPREDNISolone acetate (DEPO-MEDROL) injection 80 mg     Laterality: Bilateral  Location/Site: Bilateral L3 and L4 medial branches to effectively denervate the bilateral L4-5 facet joints L4-L5  Needle size: 18 G  Needle type: Radiofrequency cannula  Needle Placement: Along juncture of superior articular process and transverse pocess  Findings:  -Comments:  Procedure Details: For each desired target nerve, the corresponding transverse process (sacral ala for the L5 dorsal rami) was identified and the fluoroscope was positioned to square off the endplates of the corresponding vertebral body to achieve a true AP midline view.  The beam was then obliqued 15 to 20 degrees and caudally tilted 15 to 20 degrees to line up a trajectory along the target nerves. The skin over the target of the junction of superior articulating process and transverse process (sacral ala for the L5 dorsal rami) was infiltrated with 46ml of 1% Lidocaine without Epinephrine.  The  18 gauge 49mm active tip outer cannula was advanced in trajectory view to the target.  This procedure was repeated for each target nerve.  Then, for all levels, the outer cannula placement was fine-tuned and the position was then confirmed with bi-planar imaging.    Test stimulation was done both at sensory and motor levels to ensure there was no radicular stimulation. The  target tissues were then infiltrated with 1 ml of 1% Lidocaine without Epinephrine. Subsequently, a percutaneous neurotomy was carried out for 90 seconds at 80 degrees Celsius.  After the completion of the lesion, 1 ml of injectate was delivered. It was then repeated for each facet joint nerve mentioned above. Appropriate radiographs were obtained to verify the probe placement during the neurotomy.   Additional Comments:  The patient tolerated the procedure well Dressing: 2 x 2 sterile gauze and Band-Aid    Post-procedure details: Patient was observed during the procedure. Post-procedure instructions were reviewed.  Patient left the clinic in stable condition.      Clinical History: MRI LUMBAR SPINE WITHOUT CONTRAST  TECHNIQUE: Multiplanar, multisequence MR imaging of the lumbar spine was performed. No intravenous contrast was administered.  COMPARISON:  MRI lumbar spine 10/11/2014.  FINDINGS: Segmentation:  Standard.  Alignment:  Maintained.  Vertebrae: No fracture or worrisome lesion. Marrow signal is mildly heterogeneous with scattered degenerative endplate signal change appearing worst at L3-4.  Conus medullaris and cauda equina: Conus extends to the L1 level. Conus and cauda equina appear normal.  Paraspinal and other soft tissues: Negative.  Disc levels:  T11-12 is imaged in the sagittal plane only. There is a minimal disc bulge but no central canal or foraminal narrowing.  T12-L1: Negative.  L1-2: Negative.  L2-3: Shallow disc bulge with some endplate spurring. There is mild central canal and foraminal narrowing. No nerve root compression. The appearance is not markedly changed.  L3-4: Shallow broad-based disc bulge is somewhat more prominent to the left. There is some narrowing in the left lateral recess. Mild to moderate foraminal narrowing is more notable on the left. Extraforaminal disc contacts the exited L3 roots. The appearance of  this level is unchanged.  L4-5: The patient has a shallow disc bulge. More focally protruding disc within and beyond the left foramen is more prominent than on the prior exam and contacts the exiting and exited left L4 root. Moderate bilateral foraminal narrowing is seen. There is mild narrowing in the left lateral recess. Moderate facet arthropathy is noted.  L5-S1: Shallow disc bulge without stenosis.  IMPRESSION: Progressive degenerative disease at L4-5 where a disc bulge eccentrically prominent to the left and more focally protruding disc within and beyond the left foramen has increased in size. Disc contacts the exiting and exited left L4 root. There is also some narrowing in the left lateral recess at this level. The appearance lumbar spine is otherwise unchanged.  Extraforaminal disc contacts the exited L3 roots bilaterally at L3-4, unchanged.  Mild central canal and bilateral foraminal narrowing at L2-3, unchanged.   Electronically Signed   By: Inge Rise M.D.   On: 04/11/2017 20:33     Objective:  VS:  HT:    WT:   BMI:     BP:(!) 198/90  HR:91bpm  TEMP: ( )  RESP:97 % Physical Exam Constitutional:      General: He is not in acute distress.    Appearance: He is obese.     Comments: Somnolent on preprocedure triazolam  Musculoskeletal:     Comments:  Pain with extension and facet loading of the lumbar spine     Ortho Exam Imaging: No results found.

## 2018-07-12 NOTE — Procedures (Signed)
Lumbar Facet Joint Nerve Denervation  Patient: Peter [name]      Date of Birth: 12-13-1955 MRN: [id number] PCP: [name], MD      Visit Date: 07/04/2018   Universal Protocol:    Date/Time: 07/11/2008:27 PM  Consent Given By: the patient  Position: PRONE  Additional Comments: Vital signs were monitored before and after the procedure. Patient was prepped and draped in the usual sterile fashion. The correct patient, procedure, and site was verified.   Injection Procedure Details:  Procedure Site One Meds Administered:  Meds ordered this encounter  Medications  . methylPREDNISolone acetate (DEPO-MEDROL) injection 80 mg     Laterality: Bilateral  Location/Site: Bilateral L3 and L4 medial branches to effectively denervate the bilateral L4-5 facet joints L4-L5  Needle size: 18 G  Needle type: Radiofrequency cannula  Needle Placement: Along juncture of superior articular process and transverse pocess  Findings:  -Comments:  Procedure Details: For each desired target nerve, the corresponding transverse process (sacral ala for the L5 dorsal rami) was identified and the fluoroscope was positioned to square off the endplates of the corresponding vertebral body to achieve a true AP midline view.  The beam was then obliqued 15 to 20 degrees and caudally tilted 15 to 20 degrees to line up a trajectory along the target nerves. The skin over the target of the junction of superior articulating process and transverse process (sacral ala for the L5 dorsal rami) was infiltrated with 41ml of 1% Lidocaine without Epinephrine.  The 18 gauge 55mm active tip outer cannula was advanced in trajectory view to the target.  This procedure was repeated for each target nerve.  Then, for all levels, the outer cannula placement was fine-tuned and the position was then confirmed with bi-planar imaging.    Test stimulation was done both at sensory and motor levels to ensure there was no  radicular stimulation. The target tissues were then infiltrated with 1 ml of 1% Lidocaine without Epinephrine. Subsequently, a percutaneous neurotomy was carried out for 90 seconds at 80 degrees Celsius.  After the completion of the lesion, 1 ml of injectate was delivered. It was then repeated for each facet joint nerve mentioned above. Appropriate radiographs were obtained to verify the probe placement during the neurotomy.   Additional Comments:  The patient tolerated the procedure well Dressing: 2 x 2 sterile gauze and Band-Aid    Post-procedure details: Patient was observed during the procedure. Post-procedure instructions were reviewed.  Patient left the clinic in stable condition.

## 2018-07-16 ENCOUNTER — Other Ambulatory Visit: Payer: Self-pay | Admitting: Neurology

## 2018-07-16 ENCOUNTER — Telehealth: Payer: Self-pay | Admitting: Family Medicine

## 2018-07-17 NOTE — Telephone Encounter (Signed)
Refilled: 06/16/2018 Last OV: 03/20/2018 Next OV: not scheduled

## 2018-07-17 NOTE — Telephone Encounter (Signed)
Patient needs to schedule follow-up.  Please contact him to get this scheduled for sometime in the next 2 to 3 months.

## 2018-07-18 NOTE — Telephone Encounter (Signed)
Returned patient's call.  Virtual visit for f/u scheduled for 09/18/18 @ 2:00 pm.

## 2018-07-18 NOTE — Telephone Encounter (Signed)
Pt returned call

## 2018-07-18 NOTE — Telephone Encounter (Signed)
LMTCB. Need to schedule pt a follow up appt with Dr. Caryl Bis in the next 2 to 3 months.

## 2018-07-26 DIAGNOSIS — J301 Allergic rhinitis due to pollen: Secondary | ICD-10-CM | POA: Diagnosis not present

## 2018-08-02 DIAGNOSIS — J301 Allergic rhinitis due to pollen: Secondary | ICD-10-CM | POA: Diagnosis not present

## 2018-08-06 ENCOUNTER — Telehealth: Payer: Self-pay

## 2018-08-06 DIAGNOSIS — E7800 Pure hypercholesterolemia, unspecified: Secondary | ICD-10-CM

## 2018-08-06 DIAGNOSIS — D696 Thrombocytopenia, unspecified: Secondary | ICD-10-CM

## 2018-08-06 DIAGNOSIS — I10 Essential (primary) hypertension: Secondary | ICD-10-CM

## 2018-08-06 NOTE — Telephone Encounter (Signed)
Copied from [place] [id number]. Topic: General - Inquiry >> Aug 06, 2018  2:34 PM Rainey Pines A wrote: Patient would like a callback from Dr. Caryl Bis  nurse in regards to having bloodwork done before his appt on 09/18/2018

## 2018-08-06 NOTE — Telephone Encounter (Signed)
Pt had a appointment with you on 09/18/2018 and he would like labs ordered and scheduled before this date.  Britaney Espaillat,cma

## 2018-08-06 NOTE — Telephone Encounter (Signed)
Orders placed.

## 2018-08-06 NOTE — Addendum Note (Signed)
Addended by: [name] on: 08/06/2018 05:06 PM   Modules accepted: Orders

## 2018-08-09 NOTE — Telephone Encounter (Signed)
Called and left a message that the patient needs to call the office to schedule a lab appointment before is office visit.  Brantlee Penn,cma

## 2018-08-16 ENCOUNTER — Other Ambulatory Visit: Payer: Self-pay

## 2018-08-16 ENCOUNTER — Telehealth: Payer: Self-pay | Admitting: Cardiovascular Disease

## 2018-08-16 MED ORDER — PRALUENT 150 MG/ML ~~LOC~~ SOAJ: 1.0000 "pen " | SUBCUTANEOUS | 11 refills | Status: DC

## 2018-08-16 NOTE — Telephone Encounter (Signed)
Call to patient to discuss tachycardia seen on [employer] BP machine. Pt reports hx a a fib. Normal rate 65-70.  8/4 BP 136/80 95 109/72 76 162/94 HR 92  8/5 BP 118/75, HR 89 116/71, HR 99 116/76, HR 76  8/6 171/89, HR 90 161/96, 92 110/64, 101  He denies chest pain, SOB, palpitations. Does endorse a slight headache.   Made appt with Dr. Rockey Situ on Monday for EKG review.

## 2018-08-16 NOTE — Telephone Encounter (Signed)
STAT if HR is under 50 or over 120 (normal HR is 60-100 beats per minute)  1) What is your heart rate? 110  2) Do you have a log of your heart rate readings (document readings)?   8/5 89 99 76  8/4 90 92 101  8/3 104 89 99  8/2 95 105 85    3) Do you have any other symptoms? Slight headache (taking ibuprofen), occasional nausea  .  Has a history of a fib so patient is concerned. Has recently gotten his bp under control form last visit

## 2018-08-18 NOTE — Progress Notes (Deleted)
Cardiology Office Note  Date:  08/18/2018   ID:  Peter [name], DOB [birth year], MRN [id number]  PCP:  [name], MD   No chief complaint on file.   HPI:  Peter [name] is a 63 year old gentleman with  long history of smoking, 3 cigs a day hypertension,  daily alcohol drinking,  prostate cancer ,  statin intolerance ,    underlying liver dysfunction, elevated total bilirubin CAD, CT coronary calcium score 2778 in 04/2015 Stress test 07/2014 no ischemia Cardioversion 11/02/2015 For atrial flutter tolerating praluent , dramatic improvement in his cholesterol down to 160 from 260 who presents for follow-up of his coronary artery disease, hypertension, hyperlipidemia  Severe back pain,  Was unable to get cortisone shot for quite some time Only recently had a cortisone Has not been exercising, weight trending upwards, eating more  Taking HCTZ 12.5 daily Reports that he stopped his bystolic Sometimes is checking his blood pressure before taking Cardura Was previously taking this twice a day now only takes it once at nighttime Blood pressures at home typically 409 or higher systolic  Denies any chest tightness, shortness of breath No significant leg swelling  Down to several cigarettes a day Previous esophageal dilatation with Dr. [name]  Chronic tremor  LDL 56, total chol 147  EKG personally reviewed by myself on todays visit Shows normal sinus rhythm rate 85 bpm no significant ST or T-wave changes Other past medical history reviewed Prior Chest tightness, Went to the ER 08/26/2017 TNT 0.05  Other past medical history reviewed Previously passed out, hit his face on the left Was in the hospital, blood thinners held overnight 01/19/2017  anticoagulation held until platelets improved  hemoglobin of 9 down from  13    Echo January 2019  EF normal >65%  Previous EGD with Dr. [name],  Essentially normal procedure, no sign of bleeding  echocardiogram 02/2016 showing  normal LV function  Chronic back pain He went to the ER for back pain 11/22/2015 Started On tramadol PRN  Limited by back pain , was told he could get a "shot" "shaking" for 2 years since prostate seeds, stopped librium Still with heavy ETOH, previous notes indicating 4-5 beers per day Frequent nocturia Has been taking lisinopril 40 mg x 2, presumably because blood pressure was running high, now running out of his medication. Did not call our office  Previous stress test July 2016 showing no ischemia CT scan of the chest, CT coronary calcium score discussed with him in detail, images pulled up in the office. His scores close to 3000. He denies any anginal symptoms. Images show diffuse heavy calcification/calcified atherosclerosis in the LAD, diagonal branch, ostial and proximal RCA. No significant disease noted in the left circumflex.   he reports being debilitated when he was on Crestor, Lipitor He had diffuse muscle pain in his legs, has tried different doses, does not want to retry a statin given the debilitating side effects. Feels it made it impossible to get around, on top of his arthritis, severe knee pain, it was just too much  CT scan of the abdomen shows scattered diffuse mild aortic atherosclerosis.  Lab work reviewed with him showing hemoglobin A1c 5.5 , total cholesterol more than 230   PMH:   has a past medical history of A-fib ([place]), Abnormality of gait (09/03/2012), Alcohol abuse, Anxiety, Arthritis, Arthritis, Esophageal stricture, GERD (gastroesophageal reflux disease), Gout, Hyperlipidemia, Hypertension, Internal hemorrhoids, Lesion of ulnar nerve (09/03/2012), Murmur, cardiac, PAF (paroxysmal atrial fibrillation) ([place]), Palpitations,  Prostate cancer ([place]) (2016), Seizures ([place]), and Tubular adenoma of colon (03/2008).  PSH:    Past Surgical History:  Procedure Laterality Date  . CATARACT EXTRACTION W/PHACO Right 07/03/2014   Procedure: CATARACT EXTRACTION  PHACO AND INTRAOCULAR LENS PLACEMENT (IOC);  Surgeon: Lyla Glassing, MD;  Location: [facility] ORS;  Service: Ophthalmology;  Laterality: Right;  lot # [id number] H [place]: 00:32.3 AP% 9.1 CDE: 2.92  . CIRCUMCISION    . COLONOSCOPY    . ELBOW SURGERY    . ELECTROPHYSIOLOGIC STUDY N/A 11/02/2015   Procedure: CARDIOVERSION;  Surgeon: Minna Merritts, MD;  Location: [facility] ORS;  Service: Cardiovascular;  Laterality: N/A;  . ESOPHAGOGASTRODUODENOSCOPY (EGD) WITH PROPOFOL N/A 05/17/2016   Procedure: ESOPHAGOGASTRODUODENOSCOPY (EGD) WITH PROPOFOL;  Surgeon: Lucilla Lame, MD;  Location: [facility] ENDOSCOPY;  Service: Endoscopy;  Laterality: N/A;  . EYE SURGERY    . HAND SURGERY Left   . SHOULDER ARTHROSCOPY Left   . SHOULDER SURGERY      Current Outpatient Medications  Medication Sig Dispense Refill  . albuterol (PROVENTIL HFA;VENTOLIN HFA) 108 (90 Base) MCG/ACT inhaler Inhale 2 puffs into the lungs every 6 (six) hours as needed for wheezing or shortness of breath. 1 Inhaler 0  . Alirocumab (PRALUENT) 150 MG/ML SOAJ Inject 1 pen into the skin every 14 (fourteen) days. 2 pen 11  . apixaban (ELIQUIS) 5 MG TABS tablet Take 1 tablet (5 mg total) by mouth 2 (two) times daily. 180 tablet 1  . aspirin 81 MG chewable tablet Chew 1 tablet (81 mg total) by mouth daily. 30 tablet 0  . B Complex-C (B-COMPLEX WITH VITAMIN C) tablet Take 1 tablet by mouth daily.    . busPIRone (BUSPAR) 10 MG tablet TAKE ONE TABLET BY MOUTH THREE TIMES A DAY 90 tablet 0  . doxazosin (CARDURA) 8 MG tablet Take 1 tablet (8 mg total) by mouth 2 (two) times daily. 180 tablet 3  . FOLIC ACID PO Take by mouth daily.    . hydrochlorothiazide (HYDRODIURIL) 12.5 MG tablet TAKE ONE TABLET BY MOUTH EVERY MORNING FOR BLOOD PRESSURE 90 tablet 1  . Melatonin 10 MG TABS Take 1 tablet by mouth at bedtime.    . milk thistle 175 MG tablet Take 175 mg by mouth daily.    . Multiple Vitamin (MULTIVITAMIN) tablet Take 1 tablet by mouth daily. Centrum Plus -Take 1  daily    . nitroGLYCERIN (NITROSTAT) 0.4 MG SL tablet Place 1 tablet (0.4 mg total) under the tongue every 5 (five) minutes as needed for chest pain. 25 tablet 3  . traMADol (ULTRAM) 50 MG tablet TAKE ONE TABLET BY MOUTH EVERY 12 HOURS AS NEEDED FOR MODERATE PAIN.  Please call the office to schedule follow-up. 60 tablet 0  . triamcinolone cream (KENALOG) 0.1 % Apply 1 application topically daily as needed.     . triazolam (HALCION) 0.25 MG tablet TAKE 1 TABLET BY MOUTH 1 HOUR PRIOR TO PROCEDURE. TAKE WITH LIGHT FOOD. DO NOT DRIVE MOTOR VEHICLE. 2 tablet 0  . VOLTAREN 1 % GEL Apply 2 g topically as needed (knees).      Current Facility-Administered Medications  Medication Dose Route Frequency Provider Last Rate Last Dose  . methylPREDNISolone acetate (DEPO-MEDROL) injection 80 mg  80 mg Other Once Magnus Sinning, MD         Allergies:   Hydrocodone bitartrate er, Clonazepam, Crestor [rosuvastatin], Dust mite extract, Gabapentin, Lipitor [atorvastatin], Lisinopril, and Topamax [topiramate]   Social History:  The patient  reports that he has  been smoking cigarettes. He has a 2.40 pack-year smoking history. He has never used smokeless tobacco. He reports current alcohol use. He reports that he does not use drugs.   Family History:   family history includes Diabetes in his father; Hyperlipidemia in his father; Hypertension in his father and mother; Kidney disease in his father; Lung cancer in his father and mother.    Review of Systems: Review of Systems  Constitutional: Negative.   HENT: Negative.   Respiratory: Negative.   Cardiovascular: Negative.   Gastrointestinal: Negative.   Musculoskeletal: Negative.   Psychiatric/Behavioral: The patient is nervous/anxious and has insomnia.   All other systems reviewed and are negative.    PHYSICAL EXAM: VS:  There were no vitals taken for this visit. , BMI There is no height or weight on file to calculate BMI.  No significant change compared  to previous visit Constitutional:  oriented to person, place, and time. No distress.  HENT:  Head: Normocephalic and atraumatic.  Eyes:  no discharge. No scleral icterus.  Neck: Normal range of motion. Neck supple. No JVD present.  Cardiac: RRR; 1-2+ SEM RSB murmurs, rubs, or gallops, No edema No murmur heard. Pulmonary/Chest: Effort normal and breath sounds normal. No stridor. No respiratory distress.  no wheezes.  no rales.  no tenderness.  Abdominal: Soft.  no distension.  no tenderness.  Musculoskeletal: Normal range of motion.  no  tenderness or deformity.  Neurological:  normal muscle tone. Coordination normal. No atrophy Skin: Skin is warm and dry. No rash noted. not diaphoretic.  Psychiatric:  normal mood and affect. behavior is normal. Thought content normal.   Recent Labs: 01/25/2018: TSH 0.980 03/07/2018: ALT 40 03/10/2018: Magnesium 1.5 03/30/2018: BUN 16; Creatinine, Ser 0.91; Hemoglobin 13.0; Platelets 101; Potassium 3.7; Sodium 134    Lipid Panel Lab Results  Component Value Date   CHOL 147 09/18/2017   HDL 65 09/18/2017   LDLCALC 56 09/18/2017   TRIG 132 09/18/2017      Wt Readings from Last 3 Encounters:  06/25/18 268 lb 12 oz (121.9 kg)  03/20/18 254 lb (115.2 kg)  03/20/18 254 lb 12.8 oz (115.6 kg)      ASSESSMENT AND PLAN:   Essential hypertension - Plan: EKG 12-Lead Long discussion concerning his blood pressure Will avoid clonidine given symptoms of fatigue  Previous history of leg edema, will avoid calcium channel blockers Did not tolerate isosorbide in the past Off lisinopril given his reported symptoms of leg swelling Step 1 Please take cardura 4 mg twice a day With HCTZ 12.5 mg daily Step 2 If blood pressure runs high, Start bystolic 1/2 (20 mg cut in 1/2 ) daily Step 3 If rate runs high, we may need to increase the bystolic up to 20 mg daily (whole pill)  Pure hypercholesterolemia - Plan: EKG 12-Lead Tolerating  praluent Goal LDL less  than 70  Lipids discussed with him in detail, at goal  Typical atrial flutter ([place]) - Plan: EKG 12-Lead Maintaining normal sinus rhythm  Continue anticoagulation Restart beta-blocker as detailed above  Coronary artery disease involving native coronary artery of native heart without angina pectoris - Plan: EKG 12-Lead Recent episodes of chest pain Likely secondary to GI etiology.  At rest Nitro given for possible esophageal spasm  Anxiety/fatigue/depression insomnia Previously was on Ambien History of alcohol abuse  Alcohol use Not discussed with him today Chronic tremor, affecting his sleep  Smoke  down to 3 Cigarettes per day He does not want Chantix  Total encounter time more than 25 minutes  Greater than 50% was spent in counseling and coordination of care with the patient   Disposition:   F/U  6 months Recommended he call with blood pressure measurements    No orders of the defined types were placed in this encounter.    Signed, Esmond Plants, M.D., Ph.D. 08/18/2018  [place], [place]

## 2018-08-20 ENCOUNTER — Telehealth: Payer: Self-pay | Admitting: Cardiovascular Disease

## 2018-08-20 ENCOUNTER — Ambulatory Visit: Payer: 59 | Admitting: Cardiovascular Disease

## 2018-08-20 NOTE — Telephone Encounter (Signed)
Spoke with patient and he had time down for 3 pm today and felt strongly that is what time he was told to be here. Reviewed that we can reschedule and repeated date and time several times for them to write down. Also advised that Mychart would have the time listed as well. He was appreciative for the call with no further questions at this time.

## 2018-08-22 ENCOUNTER — Telehealth: Payer: Self-pay | Admitting: Neurology

## 2018-08-22 NOTE — Telephone Encounter (Signed)
Pt has called stating he is willing to schedule his f/u but would like to know if Dr Jannifer Franklin will prefer he come in or if a virtual is an option

## 2018-08-23 ENCOUNTER — Telehealth: Payer: Self-pay

## 2018-08-23 DIAGNOSIS — E7800 Pure hypercholesterolemia, unspecified: Secondary | ICD-10-CM

## 2018-08-23 DIAGNOSIS — E669 Obesity, unspecified: Secondary | ICD-10-CM

## 2018-08-23 DIAGNOSIS — I10 Essential (primary) hypertension: Secondary | ICD-10-CM

## 2018-08-23 DIAGNOSIS — Z125 Encounter for screening for malignant neoplasm of prostate: Secondary | ICD-10-CM

## 2018-08-23 DIAGNOSIS — E66811 Obesity, class 1: Secondary | ICD-10-CM

## 2018-08-23 DIAGNOSIS — J301 Allergic rhinitis due to pollen: Secondary | ICD-10-CM | POA: Diagnosis not present

## 2018-08-23 DIAGNOSIS — D696 Thrombocytopenia, unspecified: Secondary | ICD-10-CM

## 2018-08-23 NOTE — Telephone Encounter (Signed)
I reached out to the Peter [name] and advised we could schedule a virtual f/u with Dr. Jannifer Franklin. Peter [name] scheduled for 10/04/2018 at 11 am.

## 2018-08-23 NOTE — Telephone Encounter (Signed)
Copied from [employer] [id number]. Topic: General - Other >> Aug 23, 2018  3:20 PM Yvette Rack wrote: Reason for CRM: Pt called to request lab orders to be sent to [facility] on [address]. Pt also requests that a PSA be included in the lab order.

## 2018-08-23 NOTE — Telephone Encounter (Signed)
Pt called to request lab orders to be sent to [facility] on [facility]. Pt also requests that a PSA be included in the lab order.

## 2018-08-23 NOTE — Telephone Encounter (Signed)
Pt called again wanting to know the update on this. Please advise.

## 2018-08-25 NOTE — Telephone Encounter (Signed)
Ordered

## 2018-08-28 DIAGNOSIS — I10 Essential (primary) hypertension: Secondary | ICD-10-CM | POA: Diagnosis not present

## 2018-08-28 DIAGNOSIS — D696 Thrombocytopenia, unspecified: Secondary | ICD-10-CM | POA: Diagnosis not present

## 2018-08-28 DIAGNOSIS — Z125 Encounter for screening for malignant neoplasm of prostate: Secondary | ICD-10-CM | POA: Diagnosis not present

## 2018-08-28 DIAGNOSIS — E669 Obesity, unspecified: Secondary | ICD-10-CM | POA: Diagnosis not present

## 2018-08-28 DIAGNOSIS — E7800 Pure hypercholesterolemia, unspecified: Secondary | ICD-10-CM | POA: Diagnosis not present

## 2018-08-29 ENCOUNTER — Other Ambulatory Visit: Payer: Self-pay | Admitting: Family Medicine

## 2018-08-29 DIAGNOSIS — E871 Hypo-osmolality and hyponatremia: Secondary | ICD-10-CM

## 2018-08-29 LAB — HEMOGLOBIN A1C
Est. average glucose Bld gHb Est-mCnc: 103 mg/dL
Hgb A1c MFr Bld: 5.2 % (ref 4.8–5.6)

## 2018-08-29 LAB — COMPREHENSIVE METABOLIC PANEL
ALT: 38 IU/L (ref 0–44)
AST: 88 IU/L — ABNORMAL HIGH (ref 0–40)
Albumin/Globulin Ratio: 1.3 (ref 1.2–2.2)
Albumin: 3.9 g/dL (ref 3.8–4.8)
Alkaline Phosphatase: 100 IU/L (ref 39–117)
BUN/Creatinine Ratio: 12 (ref 10–24)
BUN: 12 mg/dL (ref 8–27)
Bilirubin Total: 4 mg/dL — ABNORMAL HIGH (ref 0.0–1.2)
CO2: 25 mmol/L (ref 20–29)
Calcium: 9.8 mg/dL (ref 8.6–10.2)
Chloride: 89 mmol/L — ABNORMAL LOW (ref 96–106)
Creatinine, Ser: 1.03 mg/dL (ref 0.76–1.27)
GFR calc Af Amer: 90 mL/min/{1.73_m2} (ref >59)
GFR calc non Af Amer: 77 mL/min/{1.73_m2} (ref >59)
Globulin, Total: 3 g/dL (ref 1.5–4.5)
Glucose: 128 mg/dL — ABNORMAL HIGH (ref 65–99)
Potassium: 3.6 mmol/L (ref 3.5–5.2)
Sodium: 131 mmol/L — ABNORMAL LOW (ref 134–144)
Total Protein: 6.9 g/dL (ref 6.0–8.5)

## 2018-08-29 LAB — CBC
Hematocrit: 36.5 % — ABNORMAL LOW (ref 37.5–51.0)
Hemoglobin: 13.6 g/dL (ref 13.0–17.7)
MCH: 40.1 pg — ABNORMAL HIGH (ref 26.6–33.0)
MCHC: 37.3 g/dL — ABNORMAL HIGH (ref 31.5–35.7)
MCV: 108 fL — ABNORMAL HIGH (ref 79–97)
Platelets: 75 10*3/uL — CL (ref 150–450)
RBC: 3.39 x10E6/uL — ABNORMAL LOW (ref 4.14–5.80)
RDW: 12.9 % (ref 11.6–15.4)
WBC: 6 10*3/uL (ref 3.4–10.8)

## 2018-08-29 LAB — LIPID PANEL
Chol/HDL Ratio: 1.6 ratio (ref 0.0–5.0)
Cholesterol, Total: 154 mg/dL (ref 100–199)
HDL: 94 mg/dL (ref >39)
LDL Calculated: 47 mg/dL (ref 0–99)
Triglycerides: 63 mg/dL (ref 0–149)
VLDL Cholesterol Cal: 13 mg/dL (ref 5–40)

## 2018-08-29 LAB — PSA: Prostate Specific Ag, Serum: <0.1 ng/mL (ref 0.0–4.0)

## 2018-09-01 NOTE — Progress Notes (Deleted)
Cardiology Office Note  Date:  09/01/2018   ID:  Peter [name], Peter [name] [birth year], MRN [id number]  PCP:  [name], MD   No chief complaint on file.   HPI:  Peter [name] is a 63 year old gentleman with  long history of smoking, 3 cigs a day hypertension,  daily alcohol drinking,  prostate cancer ,  statin intolerance ,    underlying liver dysfunction, elevated total bilirubin CAD, CT coronary calcium score 2778 in 04/2015 Stress test 07/2014 no ischemia Cardioversion 11/02/2015 For atrial flutter tolerating praluent , dramatic improvement in his cholesterol down to 160 from 260 who presents for follow-up of his coronary artery disease, hypertension, hyperlipidemia  Severe back pain,  Was unable to get cortisone shot for quite some time Only recently had a cortisone Has not been exercising, weight trending upwards, eating more  Taking HCTZ 12.5 daily Reports that Peter [name] stopped his bystolic Sometimes is checking his blood pressure before taking Cardura Was previously taking this twice a day now only takes it once at nighttime Blood pressures at home typically [id number] or higher systolic  Denies any chest tightness, shortness of breath No significant leg swelling  Down to several cigarettes a day Previous esophageal dilatation with Dr. [name]  Chronic tremor  LDL 56, total chol 147  EKG personally reviewed by myself on todays visit Shows normal sinus rhythm rate 85 bpm no significant ST or T-wave changes Other past medical history reviewed Prior Chest tightness, Went to the ER 08/26/2017 TNT 0.05  Other past medical history reviewed Previously passed out, hit his face on the left Was in the hospital, blood thinners held overnight 01/19/2017  anticoagulation held until platelets improved  hemoglobin of 9 down from  13    Echo January 2019  EF normal >65%  Previous EGD with Dr. [name],  Essentially normal procedure, no sign of bleeding  echocardiogram 02/2016 showing  normal LV function  Chronic back pain Peter [name] went to the ER for back pain 11/22/2015 Started On tramadol PRN  Limited by back pain , was told Peter [name] could get a "shot" "shaking" for 2 years since prostate seeds, stopped librium Still with heavy ETOH, previous notes indicating 4-5 beers per day Frequent nocturia Has been taking lisinopril 40 mg x 2, presumably because blood pressure was running high, now running out of his medication. Did not call our office  Previous stress test July 2016 showing no ischemia CT scan of the chest, CT coronary calcium score discussed with him in detail, images pulled up in the office. His scores close to 3000. Peter [name] denies any anginal symptoms. Images show diffuse heavy calcification/calcified atherosclerosis in the LAD, diagonal branch, ostial and proximal RCA. No significant disease noted in the left circumflex.   Peter [name] reports being debilitated when Peter [name] was on Crestor, Lipitor Peter [name] had diffuse muscle pain in his legs, has tried different doses, does not want to retry a statin given the debilitating side effects. Feels it made it impossible to get around, on top of his arthritis, severe knee pain, it was just too much  CT scan of the abdomen shows scattered diffuse mild aortic atherosclerosis.  Lab work reviewed with him showing hemoglobin A1c 5.5 , total cholesterol more than 230   PMH:   has a past medical history of A-fib ([place]), Abnormality of gait (09/03/2012), Alcohol abuse, Anxiety, Arthritis, Arthritis, Esophageal stricture, GERD (gastroesophageal reflux disease), Gout, Hyperlipidemia, Hypertension, Internal hemorrhoids, Lesion of ulnar nerve (09/03/2012), Murmur, cardiac, PAF (paroxysmal atrial fibrillation) ([place]), Palpitations,  Prostate cancer ([place]) (2016), Seizures ([place]), and Tubular adenoma of colon (03/2008).  PSH:    Past Surgical History:  Procedure Laterality Date  . CATARACT EXTRACTION W/PHACO Right 07/03/2014   Procedure: CATARACT EXTRACTION  PHACO AND INTRAOCULAR LENS PLACEMENT (IOC);  Surgeon: Lyla Glassing, MD;  Location: [facility] ORS;  Service: Ophthalmology;  Laterality: Right;  lot # [id number] H [place]: 00:32.3 AP% 9.1 CDE: 2.92  . CIRCUMCISION    . COLONOSCOPY    . ELBOW SURGERY    . ELECTROPHYSIOLOGIC STUDY N/A 11/02/2015   Procedure: CARDIOVERSION;  Surgeon: Minna Merritts, MD;  Location: [facility] ORS;  Service: Cardiovascular;  Laterality: N/A;  . ESOPHAGOGASTRODUODENOSCOPY (EGD) WITH PROPOFOL N/A 05/17/2016   Procedure: ESOPHAGOGASTRODUODENOSCOPY (EGD) WITH PROPOFOL;  Surgeon: Lucilla Lame, MD;  Location: [facility] ENDOSCOPY;  Service: Endoscopy;  Laterality: N/A;  . EYE SURGERY    . HAND SURGERY Left   . SHOULDER ARTHROSCOPY Left   . SHOULDER SURGERY      Current Outpatient Medications  Medication Sig Dispense Refill  . albuterol (PROVENTIL HFA;VENTOLIN HFA) 108 (90 Base) MCG/ACT inhaler Inhale 2 puffs into the lungs every 6 (six) hours as needed for wheezing or shortness of breath. 1 Inhaler 0  . Alirocumab (PRALUENT) 150 MG/ML SOAJ Inject 1 pen into the skin every 14 (fourteen) days. 2 pen 11  . apixaban (ELIQUIS) 5 MG TABS tablet Take 1 tablet (5 mg total) by mouth 2 (two) times daily. 180 tablet 1  . aspirin 81 MG chewable tablet Chew 1 tablet (81 mg total) by mouth daily. 30 tablet 0  . B Complex-C (B-COMPLEX WITH VITAMIN C) tablet Take 1 tablet by mouth daily.    . busPIRone (BUSPAR) 10 MG tablet TAKE ONE TABLET BY MOUTH THREE TIMES A DAY 90 tablet 0  . doxazosin (CARDURA) 8 MG tablet Take 1 tablet (8 mg total) by mouth 2 (two) times daily. 180 tablet 3  . FOLIC ACID PO Take by mouth daily.    . hydrochlorothiazide (HYDRODIURIL) 12.5 MG tablet TAKE ONE TABLET BY MOUTH EVERY MORNING FOR BLOOD PRESSURE 90 tablet 1  . Melatonin 10 MG TABS Take 1 tablet by mouth at bedtime.    . milk thistle 175 MG tablet Take 175 mg by mouth daily.    . Multiple Vitamin (MULTIVITAMIN) tablet Take 1 tablet by mouth daily. Centrum Plus -Take 1  daily    . nitroGLYCERIN (NITROSTAT) 0.4 MG SL tablet Place 1 tablet (0.4 mg total) under the tongue every 5 (five) minutes as needed for chest pain. 25 tablet 3  . traMADol (ULTRAM) 50 MG tablet TAKE ONE TABLET BY MOUTH EVERY 12 HOURS AS NEEDED FOR MODERATE PAIN.  Please call the office to schedule follow-up. 60 tablet 0  . triamcinolone cream (KENALOG) 0.1 % Apply 1 application topically daily as needed.     . triazolam (HALCION) 0.25 MG tablet TAKE 1 TABLET BY MOUTH 1 HOUR PRIOR TO PROCEDURE. TAKE WITH LIGHT FOOD. DO NOT DRIVE MOTOR VEHICLE. 2 tablet 0  . VOLTAREN 1 % GEL Apply 2 g topically as needed (knees).      Current Facility-Administered Medications  Medication Dose Route Frequency Provider Last Rate Last Dose  . methylPREDNISolone acetate (DEPO-MEDROL) injection 80 mg  80 mg Other Once Magnus Sinning, MD         Allergies:   Hydrocodone bitartrate er, Clonazepam, Crestor [rosuvastatin], Dust mite extract, Gabapentin, Lipitor [atorvastatin], Lisinopril, and Topamax [topiramate]   Social History:  The patient  reports that Peter [name] has  been smoking cigarettes. Peter [name] has a 2.40 pack-year smoking history. Peter [name] has never used smokeless tobacco. Peter [name] reports current alcohol use. Peter [name] reports that Peter [name] does not use drugs.   Family History:   family history includes Diabetes in his father; Hyperlipidemia in his father; Hypertension in his father and mother; Kidney disease in his father; Lung cancer in his father and mother.    Review of Systems: Review of Systems  Constitutional: Negative.   HENT: Negative.   Respiratory: Negative.   Cardiovascular: Negative.   Gastrointestinal: Negative.   Musculoskeletal: Negative.   Psychiatric/Behavioral: The patient is nervous/anxious and has insomnia.   All other systems reviewed and are negative.    PHYSICAL EXAM: VS:  There were no vitals taken for this visit. , BMI There is no height or weight on file to calculate BMI.  No significant change compared  to previous visit Constitutional:  oriented to person, place, and time. No distress.  HENT:  Head: Normocephalic and atraumatic.  Eyes:  no discharge. No scleral icterus.  Neck: Normal range of motion. Neck supple. No JVD present.  Cardiac: RRR; 1-2+ SEM RSB murmurs, rubs, or gallops, No edema No murmur heard. Pulmonary/Chest: Effort normal and breath sounds normal. No stridor. No respiratory distress.  no wheezes.  no rales.  no tenderness.  Abdominal: Soft.  no distension.  no tenderness.  Musculoskeletal: Normal range of motion.  no  tenderness or deformity.  Neurological:  normal muscle tone. Coordination normal. No atrophy Skin: Skin is warm and dry. No rash noted. not diaphoretic.  Psychiatric:  normal mood and affect. behavior is normal. Thought content normal.   Recent Labs: 01/25/2018: TSH 0.980 03/10/2018: Magnesium 1.5 08/28/2018: ALT 38; BUN 12; Creatinine, Ser 1.03; Hemoglobin 13.6; Platelets 75; Potassium 3.6; Sodium 131    Lipid Panel Lab Results  Component Value Date   CHOL 154 08/28/2018   HDL 94 08/28/2018   LDLCALC 47 08/28/2018   TRIG 63 08/28/2018      Wt Readings from Last 3 Encounters:  06/25/18 268 lb 12 oz (121.9 kg)  03/20/18 254 lb (115.2 kg)  03/20/18 254 lb 12.8 oz (115.6 kg)      ASSESSMENT AND PLAN:   Essential hypertension - Plan: EKG 12-Lead Long discussion concerning his blood pressure Will avoid clonidine given symptoms of fatigue  Previous history of leg edema, will avoid calcium channel blockers Did not tolerate isosorbide in the past Off lisinopril given his reported symptoms of leg swelling Step 1 Please take cardura 4 mg twice a day With HCTZ 12.5 mg daily Step 2 If blood pressure runs high, Start bystolic 1/2 (20 mg cut in 1/2 ) daily Step 3 If rate runs high, we may need to increase the bystolic up to 20 mg daily (whole pill)  Pure hypercholesterolemia - Plan: EKG 12-Lead Tolerating  praluent Goal LDL less than 70   Lipids discussed with him in detail, at goal  Typical atrial flutter ([place]) - Plan: EKG 12-Lead Maintaining normal sinus rhythm  Continue anticoagulation Restart beta-blocker as detailed above  Coronary artery disease involving native coronary artery of native heart without angina pectoris - Plan: EKG 12-Lead Recent episodes of chest pain Likely secondary to GI etiology.  At rest Nitro given for possible esophageal spasm  Anxiety/fatigue/depression insomnia Previously was on Ambien History of alcohol abuse  Alcohol use Not discussed with him today Chronic tremor, affecting his sleep  Smoke  down to 3 Cigarettes per day Peter [name] does not want Chantix  Total encounter time more than 25 minutes  Greater than 50% was spent in counseling and coordination of care with the patient   Disposition:   F/U  6 months Recommended Peter [name] call with blood pressure measurements    No orders of the defined types were placed in this encounter.    Signed, Esmond Plants, M.D., Ph.D. 09/01/2018  [place], [place]

## 2018-09-03 ENCOUNTER — Ambulatory Visit: Payer: 59 | Admitting: Cardiovascular Disease

## 2018-09-05 ENCOUNTER — Other Ambulatory Visit: Payer: Self-pay | Admitting: Family Medicine

## 2018-09-05 DIAGNOSIS — E871 Hypo-osmolality and hyponatremia: Secondary | ICD-10-CM

## 2018-09-07 DIAGNOSIS — J301 Allergic rhinitis due to pollen: Secondary | ICD-10-CM | POA: Diagnosis not present

## 2018-09-13 ENCOUNTER — Other Ambulatory Visit: Payer: Self-pay | Admitting: Family Medicine

## 2018-09-13 ENCOUNTER — Telehealth: Payer: Self-pay | Admitting: Neurology

## 2018-09-13 DIAGNOSIS — R7989 Other specified abnormal findings of blood chemistry: Secondary | ICD-10-CM

## 2018-09-13 DIAGNOSIS — E871 Hypo-osmolality and hyponatremia: Secondary | ICD-10-CM | POA: Diagnosis not present

## 2018-09-13 DIAGNOSIS — R945 Abnormal results of liver function studies: Secondary | ICD-10-CM

## 2018-09-13 MED ORDER — BUSPIRONE HCL 10 MG PO TABS: 10.0000 mg | ORAL_TABLET | Freq: Three times a day (TID) | ORAL | 0 refills | Status: DC

## 2018-09-13 NOTE — Telephone Encounter (Signed)
Pt is needing a refill on his busPIRone (BUSPAR) 10 MG tablet sent to the [facility] in [place] Pt would like to know if he can get it filled because he will be out by the time his 9/24 appt comes a long. Please advise.

## 2018-09-13 NOTE — Telephone Encounter (Addendum)
30 day supply has been submitted to the pharmacy. Pt notified via vm.

## 2018-09-14 LAB — BASIC METABOLIC PANEL
BUN/Creatinine Ratio: 10 (ref 10–24)
BUN: 9 mg/dL (ref 8–27)
CO2: 20 mmol/L (ref 20–29)
Calcium: 9.4 mg/dL (ref 8.6–10.2)
Chloride: 88 mmol/L — ABNORMAL LOW (ref 96–106)
Creatinine, Ser: 0.93 mg/dL (ref 0.76–1.27)
GFR calc Af Amer: 101 mL/min/{1.73_m2} (ref >59)
GFR calc non Af Amer: 88 mL/min/{1.73_m2} (ref >59)
Glucose: 124 mg/dL — ABNORMAL HIGH (ref 65–99)
Potassium: 3.5 mmol/L (ref 3.5–5.2)
Sodium: 130 mmol/L — ABNORMAL LOW (ref 134–144)

## 2018-09-15 ENCOUNTER — Other Ambulatory Visit: Payer: Self-pay | Admitting: Family Medicine

## 2018-09-15 DIAGNOSIS — E871 Hypo-osmolality and hyponatremia: Secondary | ICD-10-CM

## 2018-09-18 ENCOUNTER — Ambulatory Visit (INDEPENDENT_AMBULATORY_CARE_PROVIDER_SITE_OTHER): Payer: Medicare Other | Admitting: Family Medicine

## 2018-09-18 ENCOUNTER — Other Ambulatory Visit: Payer: Self-pay

## 2018-09-18 ENCOUNTER — Encounter: Payer: Self-pay | Admitting: Family Medicine

## 2018-09-18 DIAGNOSIS — G8929 Other chronic pain: Secondary | ICD-10-CM | POA: Diagnosis not present

## 2018-09-18 DIAGNOSIS — K700 Alcoholic fatty liver: Secondary | ICD-10-CM | POA: Diagnosis not present

## 2018-09-18 DIAGNOSIS — F320 Major depressive disorder, single episode, mild: Secondary | ICD-10-CM

## 2018-09-18 DIAGNOSIS — M5441 Lumbago with sciatica, right side: Secondary | ICD-10-CM | POA: Diagnosis not present

## 2018-09-18 DIAGNOSIS — I10 Essential (primary) hypertension: Secondary | ICD-10-CM

## 2018-09-18 DIAGNOSIS — M5442 Lumbago with sciatica, left side: Secondary | ICD-10-CM

## 2018-09-18 NOTE — Progress Notes (Signed)
Virtual Visit via telephone Note  This visit type was conducted due to national recommendations for restrictions regarding the COVID-19 pandemic (e.g. social distancing).  This format is felt to be most appropriate for this patient at this time.  All issues noted in this document were discussed and addressed.  No physical exam was performed (except for noted visual exam findings with Video Visits).   I connected with Peter [name] today at  2:15 PM EDT by telephone and verified that I am speaking with the correct person using two identifiers. Location patient: home Location provider: work Persons participating in the virtual visit: patient, provider  I discussed the limitations, risks, security and privacy concerns of performing an evaluation and management service by telephone and the availability of in person appointments. I also discussed with the patient that there may be a patient responsible charge related to this service. The patient expressed understanding and agreed to proceed.  Interactive audio and video telecommunications were attempted between this provider and patient, however failed, due to patient having technical difficulties OR patient did not have access to video capability.  We continued and completed visit with audio only.   Reason for visit: follow-up  HPI: Chronic pain: He takes tramadol sparingly.  He notes he had a nerve block which helped significantly with his left-sided sciatica.  He notes no side effects from the tramadol.  He does still have some discomfort.  Depression/difficulty sleeping: Patient notes slight depression with COVID-19 and having to stay home.  He has trouble sleeping and will go to bed at 10 PM and then wake up about 2 hours later feeling restless.  He will read for an hour and then go back to sleep for a couple hours.  He feels as though the depression is playing a role in that.  He has been trying to avoid the news.  He does watch TV before  bed.  He does drink 4 alcoholic beverages daily with 2 before dinner and then 2 after dinner which is 3 to 4 hours prior to him getting in bed.  No caffeine intake.  He feels as though these help relax him.  He notes he does not get drunk off of these.  He does report a history of heavier alcohol use in the past.  Notes no SI.  Hypertension: Blood pressures have been running quite well.  Typically between 100-127/60-76.  Occasionally runs higher than that in the morning.  No chest pain or shortness of breath.  He does take hydrochlorothiazide.  He has been hyponatremic on his lab work.  Discussed with him that his hydrochlorothiazide could be contributing to this.  Alcohol induced fatty liver disease: He does have a mildly elevated AST.  He has seen GI previously.  He does drink 4 alcoholic beverages daily.   ROS: See pertinent positives and negatives per HPI.  Past Medical History:  Diagnosis Date   A-fib [facility])    Abnormality of gait 09/03/2012   Alcohol abuse    Anxiety    Arthritis    Arthritis    Esophageal stricture    GERD (gastroesophageal reflux disease)    Gout    Hyperlipidemia    Hypertension    Internal hemorrhoids    Lesion of ulnar nerve 09/03/2012   Right ulnar neuropathy   Murmur, cardiac    PAF (paroxysmal atrial fibrillation) ([place])    Palpitations    Prostate cancer ([place]) 2016   treated with radioactive seed implant   Seizures (  [place])    pt states he had seizure 12/14   Tubular adenoma of colon 03/2008    Past Surgical History:  Procedure Laterality Date   CATARACT EXTRACTION W/PHACO Right 07/03/2014   Procedure: CATARACT EXTRACTION PHACO AND INTRAOCULAR LENS PLACEMENT ([place]);  Surgeon: Lyla Glassing, MD;  Location: [facility] ORS;  Service: Ophthalmology;  Laterality: Right;  lot # [id number] H [place]: 00:32.3 AP% 9.1 CDE: 2.92   CIRCUMCISION     COLONOSCOPY     ELBOW SURGERY     ELECTROPHYSIOLOGIC STUDY N/A 11/02/2015   Procedure: CARDIOVERSION;   Surgeon: Minna Merritts, MD;  Location: [facility] ORS;  Service: Cardiovascular;  Laterality: N/A;   ESOPHAGOGASTRODUODENOSCOPY (EGD) WITH PROPOFOL N/A 05/17/2016   Procedure: ESOPHAGOGASTRODUODENOSCOPY (EGD) WITH PROPOFOL;  Surgeon: Lucilla Lame, MD;  Location: [facility] ENDOS[facility];  Service: Endoscopy;  Laterality: N/A;   EYE SURGERY     HAND SURGERY Left    SHOULDER ARTHROSCOPY Left    SHOULDER SURGERY      Family History  Problem Relation Age of Onset   Diabetes Father    Kidney disease Father    Lung cancer Father        smoker   Hyperlipidemia Father    Hypertension Father    Lung cancer Mother        smoke   Hypertension Mother    Colon cancer Neg Hx    Pancreatic cancer Neg Hx    Stomach cancer Neg Hx     SOCIAL HX: Current smoker, patient is cutting down   Current Outpatient Medications:    albuterol (PROVENTIL HFA;VENTOLIN HFA) 108 (90 Base) MCG/ACT inhaler, Inhale 2 puffs into the lungs every 6 (six) hours as needed for wheezing or shortness of breath., Disp: 1 Inhaler, Rfl: 0   Alirocumab (PRALUENT) 150 MG/ML SOAJ, Inject 1 pen into the skin every 14 (fourteen) days., Disp: 2 pen, Rfl: 11   apixaban (ELIQUIS) 5 MG TABS tablet, Take 1 tablet (5 mg total) by mouth 2 (two) times daily., Disp: 180 tablet, Rfl: 1   aspirin 81 MG chewable tablet, Chew 1 tablet (81 mg total) by mouth daily., Disp: 30 tablet, Rfl: 0   B Complex-C (B-COMPLEX WITH VITAMIN C) tablet, Take 1 tablet by mouth daily., Disp: , Rfl:    busPIRone (BUSPAR) 10 MG tablet, Take 1 tablet (10 mg total) by mouth 3 (three) times daily., Disp: 90 tablet, Rfl: 0   doxazosin (CARDURA) 8 MG tablet, Take 1 tablet (8 mg total) by mouth 2 (two) times daily., Disp: 180 tablet, Rfl: 3   FOLIC ACID PO, Take by mouth daily., Disp: , Rfl:    hydrochlorothiazide (HYDRODIURIL) 12.5 MG tablet, TAKE ONE TABLET BY MOUTH EVERY MORNING FOR BLOOD PRESSURE, Disp: 90 tablet, Rfl: 1   Melatonin 10 MG TABS, Take 1  tablet by mouth at bedtime., Disp: , Rfl:    milk thistle 175 MG tablet, Take 175 mg by mouth daily., Disp: , Rfl:    Multiple Vitamin (MULTIVITAMIN) tablet, Take 1 tablet by mouth daily. Centrum Plus -Take 1 daily, Disp: , Rfl:    nitroGLYCERIN (NITROSTAT) 0.4 MG SL tablet, Place 1 tablet (0.4 mg total) under the tongue every 5 (five) minutes as needed for chest pain., Disp: 25 tablet, Rfl: 3   OXISTAT 1 % lotion, APPLY TOPICALLY TO THE AFFECTED AREA EVERY DAY AS NEEDED FOR RASH, Disp: , Rfl:    traMADol (ULTRAM) 50 MG tablet, TAKE ONE TABLET BY MOUTH EVERY 12 HOURS AS NEEDED FOR MODERATE  PAIN.  Please call the office to schedule follow-up., Disp: 60 tablet, Rfl: 0   triamcinolone cream (KENALOG) 0.1 %, Apply 1 application topically daily as needed. , Disp: , Rfl:    triazolam (HALCION) 0.25 MG tablet, TAKE 1 TABLET BY MOUTH 1 HOUR PRIOR TO PROCEDURE. TAKE WITH LIGHT FOOD. DO NOT DRIVE MOTOR VEHICLE., Disp: 2 tablet, Rfl: 0   VOLTAREN 1 % GEL, Apply 2 g topically as needed (knees). , Disp: , Rfl:   Current Facility-Administered Medications:    methylPREDNISolone acetate (DEPO-MEDROL) injection 80 mg, 80 mg, Other, Once, Magnus Sinning, MD  EXAM: This was a telehealth telephone visit and thus no physical exam was completed.  ASSESSMENT AND PLAN:  Discussed the following assessment and plan:  Essential hypertension Very well controlled.  Given his hyponatremia I have asked him to stop his hydrochlorothiazide.  Discussed 2-week follow-up for check-in regarding his blood pressure.  Discussed rechecking his sodium in 1 week.  Advised that orders are in place for this to be done at [facility].  If BP is elevated in 2 weeks we can start on an alternative medication.  Alcohol induced fatty liver Patient's AST is mildly elevated.  We have referred him back to GI.  I counseled him on progressively decreasing alcohol use.  Discussed that the alcohol intake currently is likely contributing to  continued liver damage.  I advised him not to discontinue alcohol all of a sudden given the risk of withdrawal and death.  Advised that I would send him the number for the [facility] in [place] for alcohol use counseling and possible treatment.  He noted he would consider calling them.  Chronic low back pain (Primary Area of Pain) (Bilateral) (L>R) w/ sciatica (Bilateral) Proved with injection.  Tramadol has been beneficial and he is taking this sparingly since his back pain has improved.  Depression, major, single episode, mild ([place]) Patient with mild depression.  I think this is contributing to his sleeping issues.  I additionally discussed that his alcohol use could be contributing to depression and sleeping issues as well as it does act as a depressant and can interrupt with sleep architecture.  Discussed progressively decreasing his alcohol intake.  I will check with our clinical pharmacist regarding an appropriate medication to start him on to help with his depression and then will contact the patient with this information.    I discussed the assessment and treatment plan with the patient. The patient was provided an opportunity to ask questions and all were answered. The patient agreed with the plan and demonstrated an understanding of the instructions.   The patient was advised to call back or seek an in-person evaluation if the symptoms worsen or if the condition fails to improve as anticipated.  I provided 30 minutes of non-face-to-face time during this encounter.   Tommi Rumps, MD

## 2018-09-20 ENCOUNTER — Telehealth: Payer: Self-pay | Admitting: Family Medicine

## 2018-09-20 ENCOUNTER — Encounter: Payer: Self-pay | Admitting: Family Medicine

## 2018-09-20 DIAGNOSIS — F320 Major depressive disorder, single episode, mild: Secondary | ICD-10-CM | POA: Insufficient documentation

## 2018-09-20 DIAGNOSIS — J301 Allergic rhinitis due to pollen: Secondary | ICD-10-CM | POA: Diagnosis not present

## 2018-09-20 MED ORDER — TRAMADOL HCL 50 MG PO TABS: ORAL_TABLET | ORAL | 0 refills | Status: DC

## 2018-09-20 MED ORDER — MIRTAZAPINE 15 MG PO TABS: 15.0000 mg | ORAL_TABLET | Freq: Every day | ORAL | 2 refills | Status: AC

## 2018-09-20 NOTE — Telephone Encounter (Signed)
I called pt and left a vm to call ofc to Return in about 2 weeks (around 10/02/2018) for htn follow-up.

## 2018-09-20 NOTE — Assessment & Plan Note (Signed)
Patient with mild depression.  I think this is contributing to his sleeping issues.  I additionally discussed that his alcohol use could be contributing to depression and sleeping issues as well as it does act as a depressant and can interrupt with sleep architecture.  Discussed progressively decreasing his alcohol intake.  I will check with our clinical pharmacist regarding an appropriate medication to start him on to help with his depression and then will contact the patient with this information.

## 2018-09-20 NOTE — Assessment & Plan Note (Signed)
Patient's AST is mildly elevated.  We have referred him back to GI.  I counseled him on progressively decreasing alcohol use.  Discussed that the alcohol intake currently is likely contributing to continued liver damage.  I advised him not to discontinue alcohol all of a sudden given the risk of withdrawal and death.  Advised that I would send him the number for the [facility] in [place] for alcohol use counseling and possible treatment.  He noted he would consider calling them.

## 2018-09-20 NOTE — Telephone Encounter (Signed)
-----   Message from De Hollingshead, [facility] sent at 09/20/2018 12:43 PM EDT ----- Marykay Lex -   I agree with mirtazapine 15 mg daily. I'm not concerned with tramadol, since your note said he's using it sparingly.    Also not concerned about liver/alcohol. Obviously, would counsel to be cautious with combining too much alcohol when starting mirtazapine, until he sees how the medication affects him.   Catie  ----- Message ----- From: [name], MD Sent: 09/20/2018   9:22 AM EDT To: De Hollingshead, [facility]  Hey Catie,  Patient has had issues with depression and sleep recently.  I was considering placing him on Remeron to see if that would help with depression and sleep though I wanted to make sure that it would be okay to start him on this given his other medications (i.e. the tramadol) and his history of liver disease and alcohol use.  Please let me know which you think regarding the Remeron.  We could also consider Zoloft or Lexapro if the Remeron is not a great option.  Thanks for your help.Randall Hiss

## 2018-09-20 NOTE — Assessment & Plan Note (Signed)
Proved with injection.  Tramadol has been beneficial and he is taking this sparingly since his back pain has improved.

## 2018-09-20 NOTE — Telephone Encounter (Signed)
Patient aware and voiced understanding.  Patient requested a refill on tramadol.  Patient said that he is almost of out medication.

## 2018-09-20 NOTE — Telephone Encounter (Signed)
Please let the patient know I heard back from our clinical pharmacist regarding medication for depression.  I am going to start him on mirtazapine 15 mg daily.  He needs to monitor for any drowsiness with this and he should be cautious with combining this with too much alcohol when starting on the medication.  If he has excessive drowsiness he should let us know.  Thanks.

## 2018-09-20 NOTE — Addendum Note (Signed)
Addended by: [name] on: 09/20/2018 06:09 PM   Modules accepted: Orders

## 2018-09-20 NOTE — Telephone Encounter (Signed)
Sent to pharmacy 

## 2018-09-20 NOTE — Assessment & Plan Note (Signed)
Very well controlled.  Given his hyponatremia I have asked him to stop his hydrochlorothiazide.  Discussed 2-week follow-up for check-in regarding his blood pressure.  Discussed rechecking his sodium in 1 week.  Advised that orders are in place for this to be done at [facility].  If BP is elevated in 2 weeks we can start on an alternative medication.

## 2018-09-21 ENCOUNTER — Telehealth: Payer: Self-pay

## 2018-09-21 NOTE — Telephone Encounter (Signed)
Patient gave some positive feedback about his PCP and staff while on the phone.  Patient was appreciative of the quality of care provided by his doctor and clinic staff members.  Patient wanted the doctor and clinic administrator to know.  Informed patient that I would forward his comments.

## 2018-09-21 NOTE — Telephone Encounter (Signed)
Noted. I appreciate the feedback.

## 2018-09-21 NOTE — Telephone Encounter (Signed)
Will send previous message as a phone or staff message.

## 2018-09-21 NOTE — Telephone Encounter (Deleted)
Patient gave some positive feedback about his PCP and staff on yesterday.  Patient was appreciative of the quality of care provided by his doctor and clinic staff members.  Patient wanted the doctor and clinic administrator to know.  Informed patient that I would forward his comments.

## 2018-09-26 ENCOUNTER — Other Ambulatory Visit: Payer: Self-pay | Admitting: Cardiovascular Disease

## 2018-09-26 NOTE — Telephone Encounter (Signed)
Pt's age 63, wt 111.1 kg, SCr 0.93, CrCl 129.42, last ov w/ TG 06/25/18.

## 2018-09-26 NOTE — Telephone Encounter (Signed)
73m 111.1kg Scr 0.93 09/13/18 Lovw/gollan 06/25/18

## 2018-09-26 NOTE — Telephone Encounter (Signed)
Please review for refill. Thank you! 

## 2018-09-28 ENCOUNTER — Telehealth: Payer: Self-pay | Admitting: Radiology

## 2018-09-28 NOTE — Telephone Encounter (Signed)
Patient is calling states that is has been trying to call us all week to get an appt with Dr. Ernestina Patches (I do not see where there has been a message on this). But he states that he is in desperate need of a nerve block.  Please advise

## 2018-09-28 NOTE — Telephone Encounter (Signed)
Called pt lvm #1.  

## 2018-09-28 NOTE — Telephone Encounter (Signed)
Last procedure performed was a radiofrequency ablation in June.  Would need to find out exactly what he means by nerve block.  It appears that looking at the notes from his primary care physician that he told his primary care physician he had a nerve block in June that helped but in point of fact that was an ablation.  Can see him for an office visit for evaluation but not sure exactly what he means by needing a nerve block.  He does have some type of follow-up with Dr. Jannifer Franklin in neurology in a few days.

## 2018-10-01 ENCOUNTER — Telehealth: Payer: Self-pay | Admitting: *Deleted

## 2018-10-01 NOTE — Telephone Encounter (Signed)
Pt is scheduled for an OV, sent message to Dr. Parks Ranger prescribed tramadol to pt.

## 2018-10-01 NOTE — Telephone Encounter (Signed)
I prescribe the patient tramadol for his pain.  It appears he just got a refill on 09/23/2018 thus he should have enough to cover until he gets to his injection.  Tramadol is the strongest medication that I would prescribe for his pain.

## 2018-10-02 NOTE — Telephone Encounter (Signed)
Spoke with pt and advised

## 2018-10-04 ENCOUNTER — Encounter: Payer: Self-pay | Admitting: Neurology

## 2018-10-04 ENCOUNTER — Telehealth (INDEPENDENT_AMBULATORY_CARE_PROVIDER_SITE_OTHER): Payer: Medicare Other | Admitting: Neurology

## 2018-10-04 DIAGNOSIS — J301 Allergic rhinitis due to pollen: Secondary | ICD-10-CM | POA: Diagnosis not present

## 2018-10-04 DIAGNOSIS — G250 Essential tremor: Secondary | ICD-10-CM | POA: Diagnosis not present

## 2018-10-04 MED ORDER — BUSPIRONE HCL 10 MG PO TABS: 10.0000 mg | ORAL_TABLET | Freq: Three times a day (TID) | ORAL | 3 refills | Status: AC

## 2018-10-04 NOTE — Progress Notes (Signed)
    Virtual Visit via Video Note  I connected with Buckner Malta on 10/04/18 at 11:30 AM EDT by a video enabled telemedicine application and verified that I am speaking with the correct person using two identifiers.  Location: Patient: The patient is at home. Provider: Physician in office.   I discussed the limitations of evaluation and management by telemedicine and the availability of in person appointments. The patient expressed understanding and agreed to proceed.  History of Present Illness: Natnael Eppinger is a 63 year old right-handed white male with a history of an essential tremor and a history of low back pain.  The patient is followed through orthopedic surgery for his low back pain, he sees Dr. Laurence Spates for pain control and gets injections in the back and he has had radiofrequency ablation procedures as well.  The essential tremor has been under good control on the BuSpar taking 10 mg 3 times daily.  He tolerates the drug well.  He denies any significant issues with handwriting or feeding himself.  Currently, the tremor does not offer significant disability for him.   Observations/Objective: The patient is alert and cooperative, speech is well enunciated, not aphasic or dysarthric.  The extraocular movements are full, he is able to protrude the tongue in the midline with good lateral movement of the tongue.  He has good finger-nose-finger and heel shin bilaterally.  Minimal tremor seen with arms outstretched.  He is able to ambulate normally with good tandem gait.  Romberg is negative.  No drift is seen.  Assessment and Plan: 1.  Essential tremor  2.  Chronic low back pain  The patient is doing well with the BuSpar we will continue the prescription for now, he will follow-up in 1 year, sooner if needed.  Follow Up Instructions: 1 year follow-up, may see nurse practitioner.   I discussed the assessment and treatment plan with the patient. The patient was provided an  opportunity to ask questions and all were answered. The patient agreed with the plan and demonstrated an understanding of the instructions.   The patient was advised to call back or seek an in-person evaluation if the symptoms worsen or if the condition fails to improve as anticipated.  I provided 15 minutes of non-face-to-face time during this encounter.   Kathrynn Ducking, MD

## 2018-10-06 NOTE — Progress Notes (Signed)
Cardiology Office Note  Date:  10/08/2018   ID:  Peter [name], DOB [birth year], MRN [id number]  PCP:  Peter Haven, MD   Chief Complaint  Patient presents with  . other    Afib. Medications reviewed verbally.     HPI:  Peter [name] is a 63 year old gentleman with  long history of smoking, 3 cigs a day hypertension,  daily alcohol drinking,  prostate cancer ,  statin intolerance ,    underlying liver dysfunction, elevated total bilirubin CAD, CT coronary calcium score 2778 in 04/2015 Stress test 07/2014 no ischemia Cardioversion 11/02/2015 For atrial flutter tolerating praluent , dramatic improvement in his cholesterol down to 160 from 260 who presents for follow-up of his coronary artery disease, hypertension, hyperlipidemia  In follow-up today reports that he feels well with no complaints EKG discussed with him showing Atrial fib today Pulse up 4 weeks at home on home monitoring  On discussion of his medications Takes cardura 8 in the AM 4 mg lunch 8 mg PM  Rarely take bystiolic 10 Stop the HCTZ secondary to hyponatremia  No regular exercise or activity No chest pain, shortness of breath, no leg swelling  1 cigarette/day  Previous esophageal dilatation with Dr. Allen [name] Chronic tremor  Lab work reviewed LDL 56, total chol 147  EKG personally reviewed by myself on todays visit Shows atrial fibrillation with ventricular rate 127 bpm left axis deviation  Other past medical history reviewed Prior Chest tightness, Went to the ER 08/26/2017 TNT 0.05  Previously passed out, hit his face on the left Was in the hospital, blood thinners held overnight 01/19/2017  anticoagulation held until platelets improved  hemoglobin of 9 down from  13    Echo January 2019  EF normal >65%  Previous EGD with Dr. Allen [name],  Essentially normal procedure, no sign of bleeding  echocardiogram 02/2016 showing normal LV function  Chronic back pain He went to the ER for back pain  11/22/2015 Started On tramadol PRN  Limited by back pain , was told he could get a "shot" "shaking" for 2 years since prostate seeds, stopped librium Still with heavy ETOH, previous notes indicating 4-5 beers per day Frequent nocturia Has been taking lisinopril 40 mg x 2, presumably because blood pressure was running high, now running out of his medication. Did not call our office  Previous stress test July 2016 showing no ischemia CT scan of the chest, CT coronary calcium score discussed with him in detail, images pulled up in the office. His scores close to 3000. He denies any anginal symptoms. Images show diffuse heavy calcification/calcified atherosclerosis in the LAD, diagonal branch, ostial and proximal RCA. No significant disease noted in the left circumflex.   he reports being debilitated when he was on Crestor, Lipitor He had diffuse muscle pain in his legs, has tried different doses, does not want to retry a statin given the debilitating side effects. Feels it made it impossible to get around, on top of his arthritis, severe knee pain, it was just too much  CT scan of the abdomen shows scattered diffuse mild aortic atherosclerosis.   PMH:   has a past medical history of A-fib ([place]), Abnormality of gait (09/03/2012), Alcohol abuse, Anxiety, Arthritis, Arthritis, Esophageal stricture, GERD (gastroesophageal reflux disease), Gout, Hyperlipidemia, Hypertension, Internal hemorrhoids, Lesion of ulnar nerve (09/03/2012), Murmur, cardiac, PAF (paroxysmal atrial fibrillation) ([place]), Palpitations, Prostate cancer ([place]) (2016), Seizures ([place]), and Tubular adenoma of colon (03/2008).  PSH:    Past Surgical History:  Procedure Laterality Date  . CATARACT EXTRACTION W/PHACO Right 07/03/2014   Procedure: CATARACT EXTRACTION PHACO AND INTRAOCULAR LENS PLACEMENT (IOC);  Surgeon: Peter Glassing, MD;  Location: [facility] ORS;  Service: Ophthalmology;  Laterality: Right;  lot # [id number] H [place]:  00:32.3 AP% 9.1 CDE: 2.92  . CIRCUMCISION    . COLONOSCOPY    . ELBOW SURGERY    . ELECTROPHYSIOLOGIC STUDY N/A 11/02/2015   Procedure: CARDIOVERSION;  Surgeon: Peter Merritts, MD;  Location: [facility] ORS;  Service: Cardiovascular;  Laterality: N/A;  . ESOPHAGOGASTRODUODENOSCOPY (EGD) WITH PROPOFOL N/A 05/17/2016   Procedure: ESOPHAGOGASTRODUODENOSCOPY (EGD) WITH PROPOFOL;  Surgeon: Peter Lame, MD;  Location: [facility] ENDOSCOPY;  Service: Endoscopy;  Laterality: N/A;  . EYE SURGERY    . HAND SURGERY Left   . SHOULDER ARTHROSCOPY Left   . SHOULDER SURGERY      Current Outpatient Medications  Medication Sig Dispense Refill  . Alirocumab (PRALUENT) 150 MG/ML SOAJ Inject 1 pen into the skin every 14 (fourteen) days. 2 pen 11  . B Complex-C (B-COMPLEX WITH VITAMIN C) tablet Take 1 tablet by mouth daily.    . busPIRone (BUSPAR) 10 MG tablet Take 1 tablet (10 mg total) by mouth 3 (three) times daily. 270 tablet 3  . doxazosin (CARDURA) 8 MG tablet Take 1 tablet (8 mg total) by mouth 2 (two) times daily. 180 tablet 3  . ELIQUIS 5 MG TABS tablet TAKE ONE TABLET BY MOUTH TWICE A DAY 180 tablet 1  . FOLIC ACID PO Take by mouth daily.    . hydrochlorothiazide (HYDRODIURIL) 12.5 MG tablet TAKE ONE TABLET BY MOUTH EVERY MORNING FOR BLOOD PRESSURE 90 tablet 1  . milk thistle 175 MG tablet Take 175 mg by mouth daily.    . mirtazapine (REMERON) 15 MG tablet Take 1 tablet (15 mg total) by mouth at bedtime. 30 tablet 2  . Multiple Vitamin (MULTIVITAMIN) tablet Take 1 tablet by mouth daily. Centrum Plus -Take 1 daily    . nitroGLYCERIN (NITROSTAT) 0.4 MG SL tablet Place 1 tablet (0.4 mg total) under the tongue every 5 (five) minutes as needed for chest pain. 25 tablet 3  . OXISTAT 1 % lotion APPLY TOPICALLY TO THE AFFECTED AREA EVERY DAY AS NEEDED FOR RASH    . traMADol (ULTRAM) 50 MG tablet TAKE ONE TABLET BY MOUTH EVERY 12 HOURS AS NEEDED FOR MODERATE PAIN.  Please call the office to schedule follow-up. 60  tablet 0  . triamcinolone cream (KENALOG) 0.1 % Apply 1 application topically daily as needed.     . triazolam (HALCION) 0.25 MG tablet TAKE 1 TABLET BY MOUTH 1 HOUR PRIOR TO PROCEDURE. TAKE WITH LIGHT FOOD. DO NOT DRIVE MOTOR VEHICLE. 2 tablet 0  . VOLTAREN 1 % GEL Apply 2 g topically as needed (knees).     Marland Kitchen albuterol (PROVENTIL HFA;VENTOLIN HFA) 108 (90 Base) MCG/ACT inhaler Inhale 2 puffs into the lungs every 6 (six) hours as needed for wheezing or shortness of breath. (Patient not taking: Reported on 10/08/2018) 1 Inhaler 0   Current Facility-Administered Medications  Medication Dose Route Frequency Provider Last Rate Last Dose  . methylPREDNISolone acetate (DEPO-MEDROL) injection 80 mg  80 mg Other Once Magnus Sinning, MD         Allergies:   Hydrocodone bitartrate er, Clonazepam, Crestor [rosuvastatin], Dust mite extract, Gabapentin, Lipitor [atorvastatin], Lisinopril, and Topamax [topiramate]   Social History:  The patient  reports that he has been smoking cigarettes. He has a 2.40 pack-year smoking history. He  has never used smokeless tobacco. He reports current alcohol use. He reports that he does not use drugs.   Family History:   family history includes Diabetes in his father; Hyperlipidemia in his father; Hypertension in his father and mother; Kidney disease in his father; Lung cancer in his father and mother.    Review of Systems: Review of Systems  Constitutional: Negative.   HENT: Negative.   Respiratory: Negative.   Cardiovascular: Negative.   Gastrointestinal: Negative.   Musculoskeletal: Negative.   Psychiatric/Behavioral: Negative.   All other systems reviewed and are negative.    PHYSICAL EXAM: VS:  BP (!) 158/82 (BP Location: Left Arm, Patient Position: Sitting, Cuff Size: Normal)   Pulse (!) 127   Ht 6\' 1"  (1.854 m)   Wt 271 lb (122.9 kg)   BMI 35.75 kg/m  , BMI Body mass index is 35.75 kg/m.  Constitutional:  oriented to person, place, and time. No  distress.  HENT:  Head: Grossly normal Eyes:  no discharge. No scleral icterus.  Neck: No JVD, no carotid bruits  Cardiovascular: Regular rate and rhythm, no murmurs appreciated Pulmonary/Chest: Clear to auscultation bilaterally, no wheezes or rails Abdominal: Soft.  no distension.  no tenderness.  Musculoskeletal: Normal range of motion Neurological:  normal muscle tone. Coordination normal. No atrophy Tremor Skin: Skin warm and dry Psychiatric: normal affect, pleasant   Recent Labs: 01/25/2018: TSH 0.980 03/10/2018: Magnesium 1.5 08/28/2018: ALT 38; Hemoglobin 13.6; Platelets 75 09/13/2018: BUN 9; Creatinine, Ser 0.93; Potassium 3.5; Sodium 130    Lipid Panel Lab Results  Component Value Date   CHOL 154 08/28/2018   HDL 94 08/28/2018   LDLCALC 47 08/28/2018   TRIG 63 08/28/2018      Wt Readings from Last 3 Encounters:  10/08/18 271 lb (122.9 kg)  09/18/18 245 lb (111.1 kg)  06/25/18 268 lb 12 oz (121.9 kg)      ASSESSMENT AND PLAN:   Essential hypertension - Plan: EKG 12-Lead Tolerating Cardura at high doses We will restart bystolic 10 mg twice daily for atrial fibrillation with RVR on today's visit Will avoid clonidine given symptoms of fatigue  Previous history of leg edema, will avoid calcium channel blockers Did not tolerate isosorbide in the past Off lisinopril given his reported symptoms of leg swelling  Atrial fibrillation, persistent New finding, previously with atrial flutter in 2017 Essentially asymptomatic Compliant with his anticoagulation, Eliquis Recommend he restart bystolic 10 mg twice daily  he previously held the beta-blocker as he reported it was not helping his blood pressure.  We did discuss possible cardioversion versus starting antiarrhythmic in the future. He will call us in the next 2 weeks with pulse rates Unclear at this time if he will convert on his own to normal sinus rhythm  Pure hypercholesterolemia - Plan: EKG  12-Lead Tolerating  praluent Goal LDL less than 70  Continue current medications  Typical atrial flutter ([place]) - Plan: EKG 12-Lead Prior cardioversion 2017 Now in atrial fibrillation  Coronary artery disease involving native coronary artery of native heart without angina pectoris - Plan: EKG 12-Lead Denies any chest pain No ischemic work-up at this time Smoking cessation recommended  Anxiety/fatigue/depression History of alcohol abuse Recommended alcohol cessation Likely contributing to insomnia, tremors  Smoke  down to 1 Cigarettes per day He does not want Chantix Encouragement given    Total encounter time more than 45 minutes  Greater than 50% was spent in counseling and coordination of care with the patient  Disposition:   F/U  1 month Recommended he call with blood pressure measurements    No orders of the defined types were placed in this encounter.    Signed, Esmond Plants, M.D., Ph.D. 10/08/2018  [place], [place]

## 2018-10-08 ENCOUNTER — Ambulatory Visit (INDEPENDENT_AMBULATORY_CARE_PROVIDER_SITE_OTHER): Payer: Medicare Other | Admitting: Cardiovascular Disease

## 2018-10-08 ENCOUNTER — Other Ambulatory Visit: Payer: Self-pay

## 2018-10-08 ENCOUNTER — Encounter: Payer: Self-pay | Admitting: Cardiovascular Disease

## 2018-10-08 VITALS — BP 158/82 | HR 127 | Ht 73.0 in | Wt 271.0 lb

## 2018-10-08 DIAGNOSIS — E7800 Pure hypercholesterolemia, unspecified: Secondary | ICD-10-CM

## 2018-10-08 DIAGNOSIS — I25118 Atherosclerotic heart disease of native coronary artery with other forms of angina pectoris: Secondary | ICD-10-CM | POA: Diagnosis not present

## 2018-10-08 DIAGNOSIS — I6523 Occlusion and stenosis of bilateral carotid arteries: Secondary | ICD-10-CM

## 2018-10-08 DIAGNOSIS — I4819 Other persistent atrial fibrillation: Secondary | ICD-10-CM

## 2018-10-08 DIAGNOSIS — I483 Typical atrial flutter: Secondary | ICD-10-CM

## 2018-10-08 DIAGNOSIS — Z720 Tobacco use: Secondary | ICD-10-CM | POA: Diagnosis not present

## 2018-10-08 DIAGNOSIS — I10 Essential (primary) hypertension: Secondary | ICD-10-CM | POA: Diagnosis not present

## 2018-10-08 DIAGNOSIS — I872 Venous insufficiency (chronic) (peripheral): Secondary | ICD-10-CM

## 2018-10-08 DIAGNOSIS — I700 Atherosclerosis of aorta: Secondary | ICD-10-CM

## 2018-10-08 MED ORDER — NEBIVOLOL HCL 10 MG PO TABS: 10.0000 mg | ORAL_TABLET | Freq: Two times a day (BID) | ORAL | 2 refills | Status: DC

## 2018-10-08 NOTE — Patient Instructions (Addendum)
Medication Instructions:  Please start bystolic 10 mg twice a day Stay on cardura whole pill twice a day  monitor pulse, please call with pulse numbers  [phone]  Monitor pulse   If you need a refill on your cardiac medications before your next appointment, please call your pharmacy.    Lab work: No new labs needed   If you have labs (blood work) drawn today and your tests are completely normal, you will receive your results only by: Marland Kitchen MyChart Message (if you have MyChart) OR . A paper copy in the mail If you have any lab test that is abnormal or we need to change your treatment, we will call you to review the results.   Testing/Procedures: No new testing needed   Follow-Up: At [facility], you and your health needs are our priority.  As part of our continuing mission to provide you with exceptional heart care, we have created designated Provider Care Teams.  These Care Teams include your primary Cardiologist (physician) and Advanced Practice Providers (APPs -  Physician Assistants and Nurse Practitioners) who all work together to provide you with the care you need, when you need it.  . You will need a follow up appointment in 1 month  . Providers on your designated Care Team:   . Murray Hodgkins, NP . Christell Faith, PA-C . Marrianne Mood, PA-C  Any Other Special Instructions Will Be Listed Below (If Applicable).  For educational health videos Log in to : [email or web address] Or : [email or web address], password : [email or web address]

## 2018-10-09 ENCOUNTER — Ambulatory Visit: Payer: 59 | Admitting: Gastroenterology

## 2018-10-09 ENCOUNTER — Telehealth: Payer: Self-pay | Admitting: Neurology

## 2018-10-09 NOTE — Telephone Encounter (Signed)
I called patient attempting to set up one year follow-up per Dr. Jannifer Franklin. Patient states he would rather call back at a later date to schedule this appt.

## 2018-10-09 NOTE — Telephone Encounter (Signed)
-----   Message from Kathrynn Ducking, MD sent at 10/04/2018 11:49 AM EDT ----- 1 year follow-up, may see nurse practitioner

## 2018-10-15 ENCOUNTER — Encounter: Payer: Self-pay | Admitting: Physical Medicine and Rehabilitation

## 2018-10-16 ENCOUNTER — Other Ambulatory Visit (INDEPENDENT_AMBULATORY_CARE_PROVIDER_SITE_OTHER): Payer: Self-pay | Admitting: Orthopedic Surgery

## 2018-10-16 ENCOUNTER — Other Ambulatory Visit: Payer: Self-pay | Admitting: Family Medicine

## 2018-10-16 ENCOUNTER — Other Ambulatory Visit: Payer: Self-pay | Admitting: Physical Medicine and Rehabilitation

## 2018-10-16 NOTE — Telephone Encounter (Signed)
Please advise 

## 2018-10-17 ENCOUNTER — Encounter: Payer: Self-pay | Admitting: Physical Medicine and Rehabilitation

## 2018-10-17 ENCOUNTER — Ambulatory Visit (INDEPENDENT_AMBULATORY_CARE_PROVIDER_SITE_OTHER): Payer: Medicare Other | Admitting: Physical Medicine and Rehabilitation

## 2018-10-17 ENCOUNTER — Other Ambulatory Visit: Payer: Self-pay

## 2018-10-17 ENCOUNTER — Ambulatory Visit: Payer: Self-pay

## 2018-10-17 VITALS — BP 160/98 | HR 68 | Ht 73.0 in | Wt 268.0 lb

## 2018-10-17 DIAGNOSIS — I6523 Occlusion and stenosis of bilateral carotid arteries: Secondary | ICD-10-CM

## 2018-10-17 DIAGNOSIS — M5416 Radiculopathy, lumbar region: Secondary | ICD-10-CM | POA: Diagnosis not present

## 2018-10-17 DIAGNOSIS — M5116 Intervertebral disc disorders with radiculopathy, lumbar region: Secondary | ICD-10-CM

## 2018-10-17 DIAGNOSIS — G894 Chronic pain syndrome: Secondary | ICD-10-CM

## 2018-10-17 DIAGNOSIS — M545 Low back pain, unspecified: Secondary | ICD-10-CM

## 2018-10-17 DIAGNOSIS — G8929 Other chronic pain: Secondary | ICD-10-CM

## 2018-10-17 DIAGNOSIS — M47816 Spondylosis without myelopathy or radiculopathy, lumbar region: Secondary | ICD-10-CM | POA: Diagnosis not present

## 2018-10-17 MED ORDER — BETAMETHASONE SOD PHOS & ACET 6 (3-3) MG/ML IJ SUSP
12.0000 mg | Freq: Once | INTRAMUSCULAR | Status: AC
  Administered 2018-10-17: 11:00:00 12 mg

## 2018-10-17 NOTE — Progress Notes (Signed)
.  Numeric Pain Rating Scale and Functional Assessment Average Pain 8 Pain Right Now 6 My pain is constant, dull and aching Pain is worse with: bending Pain improves with: medication   In the last MONTH (on 0-10 scale) has pain interfered with the following?  1. General activity like being  able to carry out your everyday physical activities such as walking, climbing stairs, carrying groceries, or moving a chair?  Rating(7)  2. Relation with others like being able to carry out your usual social activities and roles such as  activities at home, at work and in your community. Rating(7)  3. Enjoyment of life such that you have  been bothered by emotional problems such as feeling anxious, depressed or irritable?  Rating(4)

## 2018-10-17 NOTE — Progress Notes (Signed)
Peter [name] - 63 y.o. male MRN [id number]  Date of birth: [birth year]  Office Visit Note: Visit Date: 10/17/2018 PCP: [name], MD Referred by: [name], MD  Subjective: Chief Complaint  Patient presents with  . Lower Back - Pain  . Left Leg - Pain   HPI: Peter [name] is a 63 y.o. male who comes in today Evaluation and management after radiofrequency ablation of the L4-5 facet joints.  By way of brief history patient has had chronic back pain has been seeing Dr. Joni Fears for many years and in that regard seeing myself through Dr. Durward Fortes.  In the past facet joint blocks were very beneficial for many many months at a time.  This is become less efficacious.  Last injections were beneficial but did not last very long so we proceeded with radiofrequency ablation.  He reports the ablation procedure helped greatly with his back pain but his pain returned after just a few weeks.  He reports more left-sided hip and thigh pain along with the back pain at this point.  The leg pain is somewhat new.  Last MRI that we had obtained does show that the patient has degenerative disc issues which by itself is not a painful condition which is hard to get him to understand but he does have disc bulging with some level of lateral recess narrowing and extraforaminal narrowing particular at L3-4 and L4-5.  I think today completing bilateral L4 transforaminal injections hopefully will go a long way to helping him out.  If it does not seem to last very long I think at that point I really do not know what else to offer the patient.  He could look at surgical referral just as a information purposes.  He has had tremendous amount of weight gain since have seen and we encouraged exercise and weight loss which he says he can do while he is in pain.  He may benefit from a comprehensive pain management approach with cognitive behavioral therapy and pain coping strategies.  Review of Systems   Constitutional: Negative for chills, fever, malaise/fatigue and weight loss.  HENT: Negative for hearing loss and sinus pain.   Eyes: Negative for blurred vision, double vision and photophobia.  Respiratory: Negative for cough and shortness of breath.   Cardiovascular: Negative for chest pain, palpitations and leg swelling.  Gastrointestinal: Negative for abdominal pain, nausea and vomiting.  Genitourinary: Negative for flank pain.  Musculoskeletal: Positive for back pain and joint pain. Negative for myalgias.  Skin: Negative for itching and rash.  Neurological: Negative for tremors, focal weakness and weakness.  Endo/Heme/Allergies: Negative.   Psychiatric/Behavioral: Negative for depression.  All other systems reviewed and are negative.  Otherwise per HPI.  Assessment & Plan: Visit Diagnoses:  1. Lumbar radiculopathy   2. Radiculopathy due to lumbar intervertebral disc disorder   3. Spondylosis without myelopathy or radiculopathy, lumbar region   4. Chronic bilateral low back pain without sciatica   5. Chronic pain syndrome     Plan: Findings:   I think today completing bilateral L4 transforaminal injections hopefully will go a long way to helping him out.  If it does not seem to last very long I think at that point I really do not know what else to offer the patient.  He could look at surgical referral just as a information purposes.  He has had tremendous amount of weight gain since have seen and we encouraged exercise and weight loss  which he says he can do while he is in pain.  He may benefit from a comprehensive pain management approach with cognitive behavioral therapy and pain coping strategies.  We spoke face-to-face for over 20 minutes concerning his care of his low back pain.  He has responded to a mild amount of tramadol in the past and this can be managed by his primary care physician Dr. Caryl Bis.  Again I hate to tell the patient noticed do not know where else to head with  him.  He has had physical therapy he has performed with that he has had medication trials he has had injection trials.     Meds & Orders:  Meds ordered this encounter  Medications  . betamethasone acetate-betamethasone sodium phosphate (CELESTONE) injection 12 mg    Orders Placed This Encounter  Procedures  . XR C-ARM NO REPORT  . Epidural Steroid injection    Follow-up: Return if symptoms worsen or fail to improve.   Procedures: No procedures performed  Lumbosacral Transforaminal Epidural Steroid Injection - Sub-Pedicular Approach with Fluoroscopic Guidance  Patient: Peter [name]      Date of Birth: Jun 22, 1955 MRN: [id number] PCP: [name], MD      Visit Date: 10/17/2018   Universal Protocol:    Date/Time: 10/17/2018  Consent Given By: the patient  Position: PRONE  Additional Comments: Vital signs were monitored before and after the procedure. Patient was prepped and draped in the usual sterile fashion. The correct patient, procedure, and site was verified.   Injection Procedure Details:  Procedure Site One Meds Administered:  Meds ordered this encounter  Medications  . betamethasone acetate-betamethasone sodium phosphate (CELESTONE) injection 12 mg    Laterality: Bilateral  Location/Site:  L4-L5  Needle size: 22 G  Needle type: Spinal  Needle Placement: Transforaminal  Findings:    -Comments: Excellent flow of contrast along the nerve and into the epidural space.  Procedure Details: After squaring off the end-plates to get a true AP view, the C-arm was positioned so that an oblique view of the foramen as noted above was visualized. The target area is just inferior to the "nose of the scotty dog" or sub pedicular. The soft tissues overlying this structure were infiltrated with 2-3 ml. of 1% Lidocaine without Epinephrine.  The spinal needle was inserted toward the target using a "trajectory" view along the fluoroscope beam.  Under AP and  lateral visualization, the needle was advanced so it did not puncture dura and was located close the 6 O'Clock position of the pedical in AP tracterory. Biplanar projections were used to confirm position. Aspiration was confirmed to be negative for CSF and/or blood. A 1-2 ml. volume of Isovue-250 was injected and flow of contrast was noted at each level. Radiographs were obtained for documentation purposes.   After attaining the desired flow of contrast documented above, a 0.5 to 1.0 ml test dose of 0.25% Marcaine was injected into each respective transforaminal space.  The patient was observed for 90 seconds post injection.  After no sensory deficits were reported, and normal lower extremity motor function was noted,   the above injectate was administered so that equal amounts of the injectate were placed at each foramen (level) into the transforaminal epidural space.   Additional Comments:  The patient tolerated the procedure well Dressing: 2 x 2 sterile gauze and Band-Aid    Post-procedure details: Patient was observed during the procedure. Post-procedure instructions were reviewed.  Patient left the clinic in stable condition.  Clinical History: MRI LUMBAR SPINE WITHOUT CONTRAST  TECHNIQUE: Multiplanar, multisequence MR imaging of the lumbar spine was performed. No intravenous contrast was administered.  COMPARISON:  MRI lumbar spine 10/11/2014.  FINDINGS: Segmentation:  Standard.  Alignment:  Maintained.  Vertebrae: No fracture or worrisome lesion. Marrow signal is mildly heterogeneous with scattered degenerative endplate signal change appearing worst at L3-4.  Conus medullaris and cauda equina: Conus extends to the L1 level. Conus and cauda equina appear normal.  Paraspinal and other soft tissues: Negative.  Disc levels:  T11-12 is imaged in the sagittal plane only. There is a minimal disc bulge but no central canal or foraminal narrowing.  T12-L1:  Negative.  L1-2: Negative.  L2-3: Shallow disc bulge with some endplate spurring. There is mild central canal and foraminal narrowing. No nerve root compression. The appearance is not markedly changed.  L3-4: Shallow broad-based disc bulge is somewhat more prominent to the left. There is some narrowing in the left lateral recess. Mild to moderate foraminal narrowing is more notable on the left. Extraforaminal disc contacts the exited L3 roots. The appearance of this level is unchanged.  L4-5: The patient has a shallow disc bulge. More focally protruding disc within and beyond the left foramen is more prominent than on the prior exam and contacts the exiting and exited left L4 root. Moderate bilateral foraminal narrowing is seen. There is mild narrowing in the left lateral recess. Moderate facet arthropathy is noted.  L5-S1: Shallow disc bulge without stenosis.  IMPRESSION: Progressive degenerative disease at L4-5 where a disc bulge eccentrically prominent to the left and more focally protruding disc within and beyond the left foramen has increased in size. Disc contacts the exiting and exited left L4 root. There is also some narrowing in the left lateral recess at this level. The appearance lumbar spine is otherwise unchanged.  Extraforaminal disc contacts the exited L3 roots bilaterally at L3-4, unchanged.  Mild central canal and bilateral foraminal narrowing at L2-3, unchanged.   Electronically Signed   By: Inge Rise M.D.   On: 04/11/2017 20:33   He reports that he has been smoking cigarettes. He has a 2.40 pack-year smoking history. He has never used smokeless tobacco.  Recent Labs    08/28/18 1304  HGBA1C 5.2    Objective:  VS:  HT:6\' 1"  (185.4 cm)   WT:268 lb (121.6 kg)  BMI:35.37    BP:(!) 160/98  HR:68bpm  TEMP: ( )  RESP:  Physical Exam Constitutional:      General: He is not in acute distress.    Appearance: Normal appearance. He is  obese. He is not ill-appearing.  HENT:     Head: Normocephalic and atraumatic.     Right Ear: External ear normal.     Left Ear: External ear normal.  Eyes:     Extraocular Movements: Extraocular movements intact.  Cardiovascular:     Rate and Rhythm: Normal rate.     Pulses: Normal pulses.  Abdominal:     General: There is no distension.     Palpations: Abdomen is soft.  Musculoskeletal:        General: No tenderness or signs of injury.     Right lower leg: No edema.     Left lower leg: No edema.     Comments: Patient has good distal strength without clonus.  Skin:    Findings: No erythema or rash.  Neurological:     General: No focal deficit present.     Mental Status:  He is alert and oriented to person, place, and time.     Sensory: No sensory deficit.     Motor: No weakness or abnormal muscle tone.     Coordination: Coordination normal.  Psychiatric:        Mood and Affect: Mood normal.        Behavior: Behavior normal.     Ortho Exam Imaging: No results found.  Past Medical/Family/Surgical/Social History: Medications & Allergies reviewed per EMR, new medications updated. Patient Active Problem List   Diagnosis Date Noted  . Radiculopathy due to lumbar intervertebral disc disorder 11/21/2018  . Tremor, essential 10/04/2018  . Depression, major, single episode, mild ([place]) 09/20/2018  . Chest pain 03/25/2018  . Hyponatremia 03/25/2018  . Alcohol withdrawal ([place]) 03/08/2018  . Carotid artery stenosis 03/06/2018  . Fatigue 01/23/2018  . Bruit of right carotid artery 01/23/2018  . Lightheadedness 01/23/2018  . Neck pain 01/23/2018  . Bronchitis 12/27/2017  . BPH (benign prostatic hyperplasia) 11/08/2017  . Rheumatoid factor positive 10/18/2017  . Hypomagnesemia 10/18/2017  . Chronic anticoagulation (ELIQUIS) 10/18/2017  . Decreased shoulder range of motion (ROM) (Left) 10/18/2017  . Tendinopathy of rotator cuff (Left) 10/18/2017  . Labral tear of shoulder,  sequela (Left) 10/18/2017  . Strain of subscapularis muscle, sequela (Left) 10/18/2017  . Chronic knee pain (Right) 10/18/2017  . Arthropathy of knee (Right) 10/18/2017  . Enthesopathy of knee region (Right) 10/18/2017  . Osteoarthritis of knee (Right) 10/18/2017  . Spondylosis without myelopathy or radiculopathy, lumbar region 10/18/2017  . DDD (degenerative disc disease), lumbar 10/18/2017  . Lumbar facet arthropathy 10/18/2017  . Lumbar facet syndrome 10/18/2017  . Lumbar lateral recess stenosis (Left: L3-4, L4-5) 10/18/2017  . Lumbar central spinal stenosis (L2-3) 10/18/2017  . Lumbar foraminal stenosis 10/18/2017  . History of esophageal stricture 10/18/2017  . Chronic bilateral low back pain without sciatica 09/28/2017  . Chronic lower extremity pain (Secondary Area of Pain) (Bilateral) (L>R) 09/28/2017  . Chronic pain syndrome 09/28/2017  . Long term current use of opiate analgesic 09/28/2017  . Pharmacologic therapy 09/28/2017  . Disorder of skeletal system 09/28/2017  . Problems influencing health status 09/28/2017  . Chronic thoracic back pain [facility] Area of Pain) ([place]) 09/28/2017  . Atypical chest pain 09/12/2017  . Chronic shoulder pain (Left) 06/12/2017  . Allergic rhinitis 04/24/2017  . Thrombocytopenia ([place]) 01/23/2017  . Polypharmacy 01/20/2017  . Syncope 01/19/2017  . Elevated troponin 01/19/2017  . Sinusitis 06/08/2016  . Tremor 03/23/2016  . Paresthesia 03/23/2016  . Anxiety 02/23/2016  . Typical atrial flutter ([place]) 09/21/2015  . Coronary artery disease of native artery of native heart with stable angina pectoris ([place]) 05/06/2015  . Aortic atherosclerosis ([place]) 03/20/2015  . History of alcohol abuse 02/04/2015  . Alcohol induced fatty liver 02/04/2015  . Obesity (BMI 30.0-34.9) 02/04/2015  . History of prostate cancer 02/04/2015  . Chronic venous insufficiency 10/12/2012  . Lesion of ulnar nerve 09/03/2012  . Tobacco abuse 04/05/2011  .  Hyperlipidemia 01/21/2010  . Essential hypertension 12/16/2009  . Premature beats 12/16/2009   Past Medical History:  Diagnosis Date  . A-fib ([place])   . Abnormality of gait 09/03/2012  . Alcohol abuse   . Anxiety   . Arthritis   . Arthritis   . Esophageal stricture   . GERD (gastroesophageal reflux disease)   . Gout   . Hyperlipidemia   . Hypertension   . Internal hemorrhoids   . Lesion of ulnar nerve 09/03/2012   Right ulnar  neuropathy  . Murmur, cardiac   . PAF (paroxysmal atrial fibrillation) ([place])   . Palpitations   . Prostate cancer ([place]) 2016   treated with radioactive seed implant  . Seizures ([place])    pt states he had seizure 12/14  . Tubular adenoma of colon 03/2008   Family History  Problem Relation Age of Onset  . Diabetes Father   . Kidney disease Father   . Lung cancer Father        smoker  . Hyperlipidemia Father   . Hypertension Father   . Lung cancer Mother        smoke  . Hypertension Mother   . Colon cancer Neg Hx   . Pancreatic cancer Neg Hx   . Stomach cancer Neg Hx    Past Surgical History:  Procedure Laterality Date  . CATARACT EXTRACTION W/PHACO Right 07/03/2014   Procedure: CATARACT EXTRACTION PHACO AND INTRAOCULAR LENS PLACEMENT (IOC);  Surgeon: Lyla Glassing, MD;  Location: [facility] ORS;  Service: Ophthalmology;  Laterality: Right;  lot # [id number] H [place]: 00:32.3 AP% 9.1 CDE: 2.92  . CIRCUMCISION    . COLONOSCOPY    . ELBOW SURGERY    . ELECTROPHYSIOLOGIC STUDY N/A 11/02/2015   Procedure: CARDIOVERSION;  Surgeon: Minna Merritts, MD;  Location: [facility] ORS;  Service: Cardiovascular;  Laterality: N/A;  . ESOPHAGOGASTRODUODENOSCOPY (EGD) WITH PROPOFOL N/A 05/17/2016   Procedure: ESOPHAGOGASTRODUODENOSCOPY (EGD) WITH PROPOFOL;  Surgeon: Lucilla Lame, MD;  Location: [facility] ENDOSCOPY;  Service: Endoscopy;  Laterality: N/A;  . EYE SURGERY    . HAND SURGERY Left   . SHOULDER ARTHROSCOPY Left   . SHOULDER SURGERY     Social History   Occupational  History  . Occupation: Retired    Fish farm manager: Economist  . Occupation: UTILITIES OPER.    Employer: [employer]  Tobacco Use  . Smoking status: Current Every Day Smoker    Packs/day: 0.10    Years: 24.00    Pack years: 2.40    Types: Cigarettes  . Smokeless tobacco: Never Used  . Tobacco comment: down to 3  ciggs a day.  Substance and Sexual Activity  . Alcohol use: Yes    Frequency: Never    Comment: 4 beers daily   . Drug use: No    Types: Marijuana    Comment: 1970's occ.   Marland Kitchen Sexual activity: Not on file

## 2018-10-18 NOTE — Telephone Encounter (Signed)
Refill sent to pharmacy. Patient needs to have follow-up scheduled for sometime in about 2 months. Please contact him to get this scheduled.

## 2018-10-18 NOTE — Telephone Encounter (Signed)
I called and spoke with the patient and he scheduled a f/up appt in December and he prefers doxy.  Peter [name],cma

## 2018-11-01 ENCOUNTER — Telehealth: Payer: Self-pay | Admitting: Physical Medicine and Rehabilitation

## 2018-11-01 NOTE — Telephone Encounter (Signed)
I would suggest either spine surgeon evaluation or his PCP discuss chronic pain management again, We have tried Ablation and epidural with little long lasting effect. He has had PT. Could try chiropractic and we can write rx and suggest specific chiro. MRI is fairly new.

## 2018-11-01 NOTE — Telephone Encounter (Signed)
Patient would like an rx for chiropractic. He would like to see a Restaurant manager, fast food near [place]. He will look up contact information and call me back with this.

## 2018-11-12 NOTE — Progress Notes (Deleted)
Cardiology Office Note  Date:  11/12/2018   ID:  Peter [name], DOB August 12, 1955, MRN [id number]  PCP:  Peter Haven, MD   No chief complaint on file.   HPI:  Peter [name] is a 63 year old gentleman with  long history of smoking, 3 cigs a day hypertension,  daily alcohol drinking,  prostate cancer ,  statin intolerance ,    underlying liver dysfunction, elevated total bilirubin CAD, CT coronary calcium score 2778 in 04/2015 Stress test 07/2014 no ischemia Cardioversion 11/02/2015 For atrial flutter tolerating praluent , dramatic improvement in his cholesterol down to 160 from 260 who presents for follow-up of his coronary artery disease, hypertension, hyperlipidemia  Severe back pain,  Was unable to get cortisone shot for quite some time Only recently had a cortisone Has not been exercising, weight trending upwards, eating more  Taking HCTZ 12.5 daily Reports that he stopped his bystolic Sometimes is checking his blood pressure before taking Cardura Was previously taking this twice a day now only takes it once at nighttime Blood pressures at home typically [id number] or higher systolic  Denies any chest tightness, shortness of breath No significant leg swelling  Down to several cigarettes a day Previous esophageal dilatation with Peter [name]  Chronic tremor  LDL 56, total chol 147  EKG personally reviewed by myself on todays visit Shows normal sinus rhythm rate 85 bpm no significant ST or T-wave changes Other past medical history reviewed Prior Chest tightness, Went to the ER 08/26/2017 TNT 0.05  Other past medical history reviewed Previously passed out, hit his face on the left Was in the hospital, blood thinners held overnight 01/19/2017  anticoagulation held until platelets improved  hemoglobin of 9 down from  13    Echo January 2019  EF normal >65%  Previous EGD with Peter [name],  Essentially normal procedure, no sign of bleeding  echocardiogram 02/2016 showing  normal LV function  Chronic back pain He went to the ER for back pain 11/22/2015 Started On tramadol PRN  Limited by back pain , was told he could get a "shot" "shaking" for 2 years since prostate seeds, stopped librium Still with heavy ETOH, previous notes indicating 4-5 beers per day Frequent nocturia Has been taking lisinopril 40 mg x 2, presumably because blood pressure was running high, now running out of his medication. Did not call our office  Previous stress test July 2016 showing no ischemia CT scan of the chest, CT coronary calcium score discussed with him in detail, images pulled up in the office. His scores close to 3000. He denies any anginal symptoms. Images show diffuse heavy calcification/calcified atherosclerosis in the LAD, diagonal branch, ostial and proximal RCA. No significant disease noted in the left circumflex.   he reports being debilitated when he was on Crestor, Lipitor He had diffuse muscle pain in his legs, has tried different doses, does not want to retry a statin given the debilitating side effects. Feels it made it impossible to get around, on top of his arthritis, severe knee pain, it was just too much  CT scan of the abdomen shows scattered diffuse mild aortic atherosclerosis.  Lab work reviewed with him showing hemoglobin A1c 5.5 , total cholesterol more than 230   PMH:   has a past medical history of A-fib ([place]), Abnormality of gait (09/03/2012), Alcohol abuse, Anxiety, Arthritis, Arthritis, Esophageal stricture, GERD (gastroesophageal reflux disease), Gout, Hyperlipidemia, Hypertension, Internal hemorrhoids, Lesion of ulnar nerve (09/03/2012), Murmur, cardiac, PAF (paroxysmal atrial fibrillation) ([place]), Palpitations,  Prostate cancer ([place]) (2016), Seizures ([place]), and Tubular adenoma of colon (03/2008).  PSH:    Past Surgical History:  Procedure Laterality Date  . CATARACT EXTRACTION W/PHACO Right 07/03/2014   Procedure: CATARACT EXTRACTION  PHACO AND INTRAOCULAR LENS PLACEMENT (IOC);  Surgeon: Peter Glassing, MD;  Location: [facility] ORS;  Service: Ophthalmology;  Laterality: Right;  lot # [id number] H [place]: 00:32.3 AP% 9.1 CDE: 2.92  . CIRCUMCISION    . COLONOSCOPY    . ELBOW SURGERY    . ELECTROPHYSIOLOGIC STUDY N/A 11/02/2015   Procedure: CARDIOVERSION;  Surgeon: Peter Merritts, MD;  Location: [facility] ORS;  Service: Cardiovascular;  Laterality: N/A;  . ESOPHAGOGASTRODUODENOSCOPY (EGD) WITH PROPOFOL N/A 05/17/2016   Procedure: ESOPHAGOGASTRODUODENOSCOPY (EGD) WITH PROPOFOL;  Surgeon: Peter Lame, MD;  Location: [facility] ENDOSCOPY;  Service: Endoscopy;  Laterality: N/A;  . EYE SURGERY    . HAND SURGERY Left   . SHOULDER ARTHROSCOPY Left   . SHOULDER SURGERY      Current Outpatient Medications  Medication Sig Dispense Refill  . albuterol (PROVENTIL HFA;VENTOLIN HFA) 108 (90 Base) MCG/ACT inhaler Inhale 2 puffs into the lungs every 6 (six) hours as needed for wheezing or shortness of breath. (Patient not taking: Reported on 10/08/2018) 1 Inhaler 0  . Alirocumab (PRALUENT) 150 MG/ML SOAJ Inject 1 pen into the skin every 14 (fourteen) days. 2 pen 11  . B Complex-C (B-COMPLEX WITH VITAMIN C) tablet Take 1 tablet by mouth daily.    . busPIRone (BUSPAR) 10 MG tablet Take 1 tablet (10 mg total) by mouth 3 (three) times daily. 270 tablet 3  . diclofenac sodium (VOLTAREN) 1 % GEL APPLY 2-4 GRAMS FOUR TIMES A DAY 500 g 1  . doxazosin (CARDURA) 8 MG tablet Take 1 tablet (8 mg total) by mouth 2 (two) times daily. 180 tablet 3  . ELIQUIS 5 MG TABS tablet TAKE ONE TABLET BY MOUTH TWICE A DAY 180 tablet 1  . FOLIC ACID PO Take by mouth daily.    . hydrochlorothiazide (HYDRODIURIL) 12.5 MG tablet TAKE ONE TABLET BY MOUTH EVERY MORNING FOR BLOOD PRESSURE 90 tablet 1  . milk thistle 175 MG tablet Take 175 mg by mouth daily.    . mirtazapine (REMERON) 15 MG tablet Take 1 tablet (15 mg total) by mouth at bedtime. 30 tablet 2  . Multiple Vitamin (MULTIVITAMIN)  tablet Take 1 tablet by mouth daily. Centrum Plus -Take 1 daily    . nebivolol (BYSTOLIC) 10 MG tablet Take 1 tablet (10 mg total) by mouth 2 (two) times daily. 60 tablet 2  . nitroGLYCERIN (NITROSTAT) 0.4 MG SL tablet Place 1 tablet (0.4 mg total) under the tongue every 5 (five) minutes as needed for chest pain. 25 tablet 3  . OXISTAT 1 % lotion APPLY TOPICALLY TO THE AFFECTED AREA EVERY DAY AS NEEDED FOR RASH    . traMADol (ULTRAM) 50 MG tablet TAKE 1 TABLET EVERY 12 HOURS AS NEEDED FOR MODERATE PAIN 60 tablet 0  . triamcinolone cream (KENALOG) 0.1 % Apply 1 application topically daily as needed.     . triazolam (HALCION) 0.25 MG tablet TAKE 1 TABLET BY MOUTH 1 HOUR PRIOR TO PROCEDURE. TAKE WITH LIGHT FOOD. DO NOT DRIVE MOTOR VEHICLE. 2 tablet 0   Current Facility-Administered Medications  Medication Dose Route Frequency Provider Last Rate Last Dose  . betamethasone acetate-betamethasone sodium phosphate (CELESTONE) injection 12 mg  12 mg Other Once Magnus Sinning, MD      . methylPREDNISolone acetate (DEPO-MEDROL) injection 80 mg  80 mg Other Once Magnus Sinning, MD         Allergies:   Hydrocodone bitartrate er, Clonazepam, Crestor [rosuvastatin], Dust mite extract, Gabapentin, Lipitor [atorvastatin], Lisinopril, and Topamax [topiramate]   Social History:  The patient  reports that he has been smoking cigarettes. He has a 2.40 pack-year smoking history. He has never used smokeless tobacco. He reports current alcohol use. He reports that he does not use drugs.   Family History:   family history includes Diabetes in his father; Hyperlipidemia in his father; Hypertension in his father and mother; Kidney disease in his father; Lung cancer in his father and mother.    Review of Systems: Review of Systems  Constitutional: Negative.   HENT: Negative.   Respiratory: Negative.   Cardiovascular: Negative.   Gastrointestinal: Negative.   Musculoskeletal: Negative.   Psychiatric/Behavioral:  The patient is nervous/anxious and has insomnia.   All other systems reviewed and are negative.    PHYSICAL EXAM: VS:  There were no vitals taken for this visit. , BMI There is no height or weight on file to calculate BMI.  No significant change compared to previous visit Constitutional:  oriented to person, place, and time. No distress.  HENT:  Head: Normocephalic and atraumatic.  Eyes:  no discharge. No scleral icterus.  Neck: Normal range of motion. Neck supple. No JVD present.  Cardiac: RRR; 1-2+ SEM RSB murmurs, rubs, or gallops, No edema No murmur heard. Pulmonary/Chest: Effort normal and breath sounds normal. No stridor. No respiratory distress.  no wheezes.  no rales.  no tenderness.  Abdominal: Soft.  no distension.  no tenderness.  Musculoskeletal: Normal range of motion.  no  tenderness or deformity.  Neurological:  normal muscle tone. Coordination normal. No atrophy Skin: Skin is warm and dry. No rash noted. not diaphoretic.  Psychiatric:  normal mood and affect. behavior is normal. Thought content normal.   Recent Labs: 01/25/2018: TSH 0.980 03/10/2018: Magnesium 1.5 08/28/2018: ALT 38; Hemoglobin 13.6; Platelets 75 09/13/2018: BUN 9; Creatinine, Ser 0.93; Potassium 3.5; Sodium 130    Lipid Panel Lab Results  Component Value Date   CHOL 154 08/28/2018   HDL 94 08/28/2018   LDLCALC 47 08/28/2018   TRIG 63 08/28/2018      Wt Readings from Last 3 Encounters:  10/17/18 268 lb (121.6 kg)  10/08/18 271 lb (122.9 kg)  09/18/18 245 lb (111.1 kg)      ASSESSMENT AND PLAN:   Essential hypertension - Plan: EKG 12-Lead Long discussion concerning his blood pressure Will avoid clonidine given symptoms of fatigue  Previous history of leg edema, will avoid calcium channel blockers Did not tolerate isosorbide in the past Off lisinopril given his reported symptoms of leg swelling Step 1 Please take cardura 4 mg twice a day With HCTZ 12.5 mg daily Step 2 If blood  pressure runs high, Start bystolic 1/2 (20 mg cut in 1/2 ) daily Step 3 If rate runs high, we may need to increase the bystolic up to 20 mg daily (whole pill)  Pure hypercholesterolemia - Plan: EKG 12-Lead Tolerating  praluent Goal LDL less than 70  Lipids discussed with him in detail, at goal  Typical atrial flutter ([place]) - Plan: EKG 12-Lead Maintaining normal sinus rhythm  Continue anticoagulation Restart beta-blocker as detailed above  Coronary artery disease involving native coronary artery of native heart without angina pectoris - Plan: EKG 12-Lead Recent episodes of chest pain Likely secondary to GI etiology.  At rest [facility] given for  possible esophageal spasm  Anxiety/fatigue/depression insomnia Previously was on Ambien History of alcohol abuse  Alcohol use Not discussed with him today Chronic tremor, affecting his sleep  Smoke  down to 3 Cigarettes per day He does not want Chantix    Total encounter time more than 25 minutes  Greater than 50% was spent in counseling and coordination of care with the patient   Disposition:   F/U  6 months Recommended he call with blood pressure measurements    No orders of the defined types were placed in this encounter.    Signed, Esmond Plants, M.D., Ph.D. 11/12/2018  [place], [place]

## 2018-11-13 ENCOUNTER — Ambulatory Visit: Payer: 59 | Admitting: Cardiovascular Disease

## 2018-11-16 ENCOUNTER — Other Ambulatory Visit: Payer: Self-pay | Admitting: Family Medicine

## 2018-11-21 ENCOUNTER — Encounter: Payer: Self-pay | Admitting: Physical Medicine and Rehabilitation

## 2018-11-21 DIAGNOSIS — M5116 Intervertebral disc disorders with radiculopathy, lumbar region: Secondary | ICD-10-CM | POA: Insufficient documentation

## 2018-11-21 NOTE — Procedures (Signed)
Lumbosacral Transforaminal Epidural Steroid Injection - Sub-Pedicular Approach with Fluoroscopic Guidance  Patient: Peter [name]      Date of Birth: [birth year] MRN: [id number] PCP: [name], MD      Visit Date: 10/17/2018   Universal Protocol:    Date/Time: 10/17/2018  Consent Given By: the patient  Position: PRONE  Additional Comments: Vital signs were monitored before and after the procedure. Patient was prepped and draped in the usual sterile fashion. The correct patient, procedure, and site was verified.   Injection Procedure Details:  Procedure Site One Meds Administered:  Meds ordered this encounter  Medications  . betamethasone acetate-betamethasone sodium phosphate (CELESTONE) injection 12 mg    Laterality: Bilateral  Location/Site:  L4-L5  Needle size: 22 G  Needle type: Spinal  Needle Placement: Transforaminal  Findings:    -Comments: Excellent flow of contrast along the nerve and into the epidural space.  Procedure Details: After squaring off the end-plates to get a true AP view, the C-arm was positioned so that an oblique view of the foramen as noted above was visualized. The target area is just inferior to the "nose of the scotty dog" or sub pedicular. The soft tissues overlying this structure were infiltrated with 2-3 ml. of 1% Lidocaine without Epinephrine.  The spinal needle was inserted toward the target using a "trajectory" view along the fluoroscope beam.  Under AP and lateral visualization, the needle was advanced so it did not puncture dura and was located close the 6 O'Clock position of the pedical in AP tracterory. Biplanar projections were used to confirm position. Aspiration was confirmed to be negative for CSF and/or blood. A 1-2 ml. volume of Isovue-250 was injected and flow of contrast was noted at each level. Radiographs were obtained for documentation purposes.   After attaining the desired flow of contrast documented above, a  0.5 to 1.0 ml test dose of 0.25% Marcaine was injected into each respective transforaminal space.  The patient was observed for 90 seconds post injection.  After no sensory deficits were reported, and normal lower extremity motor function was noted,   the above injectate was administered so that equal amounts of the injectate were placed at each foramen (level) into the transforaminal epidural space.   Additional Comments:  The patient tolerated the procedure well Dressing: 2 x 2 sterile gauze and Band-Aid    Post-procedure details: Patient was observed during the procedure. Post-procedure instructions were reviewed.  Patient left the clinic in stable condition.

## 2018-11-22 ENCOUNTER — Other Ambulatory Visit: Payer: Self-pay

## 2018-11-22 ENCOUNTER — Encounter: Payer: Self-pay | Admitting: Nurse Practitioner

## 2018-11-22 ENCOUNTER — Ambulatory Visit (INDEPENDENT_AMBULATORY_CARE_PROVIDER_SITE_OTHER): Payer: Medicare Other | Admitting: Nurse Practitioner

## 2018-11-22 VITALS — BP 150/102 | HR 87 | Temp 97.7°F | Ht 72.0 in | Wt 283.5 lb

## 2018-11-22 DIAGNOSIS — I2510 Atherosclerotic heart disease of native coronary artery without angina pectoris: Secondary | ICD-10-CM

## 2018-11-22 DIAGNOSIS — F1010 Alcohol abuse, uncomplicated: Secondary | ICD-10-CM | POA: Diagnosis not present

## 2018-11-22 DIAGNOSIS — E782 Mixed hyperlipidemia: Secondary | ICD-10-CM

## 2018-11-22 DIAGNOSIS — Z720 Tobacco use: Secondary | ICD-10-CM | POA: Diagnosis not present

## 2018-11-22 DIAGNOSIS — I4819 Other persistent atrial fibrillation: Secondary | ICD-10-CM

## 2018-11-22 DIAGNOSIS — I10 Essential (primary) hypertension: Secondary | ICD-10-CM | POA: Diagnosis not present

## 2018-11-22 DIAGNOSIS — I6523 Occlusion and stenosis of bilateral carotid arteries: Secondary | ICD-10-CM

## 2018-11-22 MED ORDER — NEBIVOLOL HCL 20 MG PO TABS: 20.0000 mg | ORAL_TABLET | Freq: Two times a day (BID) | ORAL | 3 refills | Status: DC

## 2018-11-22 NOTE — Progress Notes (Signed)
Office Visit    Patient Name: Peter [name] Date of Encounter: 11/22/2018  Primary Care Provider:  [name], MD Primary Cardiologist:  Ida Rogue, MD  Chief Complaint    A 63 year old male with a history hypertension, hyperlipidemia, prostate cancer, coronary calcium on previous CT scan of the chest (07/2014), anxiety, cardiac murmur, atrial flutter post cardioversion (10/2015), tobacco abuse, and alcohol abuse presents for a follow on asymptomatic atrial fibrillation.   Past Medical History    Past Medical History:  Diagnosis Date  . A-fib ([place])    a.Dx 09/2018-> CHA2DS2VASc = 2-->eliquis  . Abnormality of gait 09/03/2012  . Alcohol abuse   . Anxiety   . Arthritis   . Arthritis   . Atrial flutter ([place])    a. 10/2015 s/p DCCV.  Marland Kitchen Coronary artery calcification seen on CT scan    a. 07/2014 MV: No ischemia; b. 04/2015 Cardiac CT: Ca2+ 2778. Diffuse, heavy Ca2+/atherosclerosis in LAD, Diag, ost/prox RCA. No significant LCX dzs.  . Demand ischemia ([place])    a. 02/2018 Echo: EF 60-65%, impaired relaxation. Mildly enlarged RV. RVSP 35.  Marland Kitchen Esophageal stricture   . GERD (gastroesophageal reflux disease)   . Gout   . Hepatic steatosis   . Hyperlipidemia    a. Statin intolerant --> on Praluent.  . Hypertension   . Internal hemorrhoids   . Lesion of ulnar nerve 09/03/2012   Right ulnar neuropathy  . Murmur, cardiac   . Palpitations   . Prostate cancer ([place]) 2016   treated with radioactive seed implant  . Seizures ([place])    pt states he had seizure 12/14  . Tobacco abuse   . Tubular adenoma of colon 03/2008   Past Surgical History:  Procedure Laterality Date  . CATARACT EXTRACTION W/PHACO Right 07/03/2014   Procedure: CATARACT EXTRACTION PHACO AND INTRAOCULAR LENS PLACEMENT (IOC);  Surgeon: Lyla Glassing, MD;  Location: [facility] ORS;  Service: Ophthalmology;  Laterality: Right;  lot # [id number] H [place]: 00:32.3 AP% 9.1 CDE: 2.92  . CIRCUMCISION    . COLONOSCOPY    .  ELBOW SURGERY    . ELECTROPHYSIOLOGIC STUDY N/A 11/02/2015   Procedure: CARDIOVERSION;  Surgeon: Minna Merritts, MD;  Location: [facility] ORS;  Service: Cardiovascular;  Laterality: N/A;  . ESOPHAGOGASTRODUODENOSCOPY (EGD) WITH PROPOFOL N/A 05/17/2016   Procedure: ESOPHAGOGASTRODUODENOSCOPY (EGD) WITH PROPOFOL;  Surgeon: Lucilla Lame, MD;  Location: [facility] ENDOSCOPY;  Service: Endoscopy;  Laterality: N/A;  . EYE SURGERY    . HAND SURGERY Left   . SHOULDER ARTHROSCOPY Left   . SHOULDER SURGERY      Allergies  Allergies  Allergen Reactions  . Hydrocodone Bitartrate Er Other (See Comments)    Makes him "feel funny"  . Clonazepam     Syncope  . Crestor [Rosuvastatin] Other (See Comments)    Bloating, swelling of legs, fatty liver  . Dust Mite Extract   . Gabapentin     Leg twitching at night, ineffective   . Lipitor [Atorvastatin] Other (See Comments)    Myalgias  . Lisinopril Swelling  . Topamax [Topiramate]     Cognitive clouding    History of Present Illness   A 63 year old male with a history hypertension, hyperlipidemia, prostate cancer, coronary calcium on previous CT scan of the chest (07/2014), anxiety, cardiac murmur, atrial flutter status post cardioversion (10/2015), ongoing tobacco abuse, and alcohol abuse presents for a follow up on asymptomatic atrial fibrillation. Earlier this year, in 02/2018, he was admitted w/ chest pain  and mild troponin elevation.  Echo showed nl LV fxn and plan was for outpt ischemic eval.  However, Covid hit, and this has yet to be performed.  On last visit in September, he was in atrial fibrillation with ventricular rate 127 bpm. He was asymptomatic, denied chest pain or shortness of breath. Restarted Bystolic 10 mg twice daily for his A-fib with RVR with plan for f/u today to re-eval his rhythm and determine need for DCCV.  Today, he continues to be in atrial fibrillation, controlled ventricle rate 87 bpm. He denies chest pain, palpitations, dyspnea,  pnd, orthopnea, n, v, syncope, weight gain, or early satiety. He reports he has fatigue 2-3 days after each 73-month orthopedic injection for his back, but denies any currently. He states he has mild ankle edema only when he takes his Tramadol. He denies headaches or blurry vision.  He reports some dizziness when getting up during the night to urinate. Usually, he sits on the side of the bed for a minute prior to standing up. Based on last visit, he was instructed to take Bystolic 10 mg twice a day. However, he has been taking 20mg  in the AM and 10 mg in the PM.  He had 20's left over from a Rx from last year. Despite the higher dose, BP's have been trending 150's + @ home.  He has been compliant w/ eliquis and is willing to proceed w/ DCCV.  Home Medications    Prior to Admission medications   Medication Sig Start Date End Date Taking? Authorizing Provider  albuterol (PROVENTIL HFA;VENTOLIN HFA) 108 (90 Base) MCG/ACT inhaler Inhale 2 puffs into the lungs every 6 (six) hours as needed for wheezing or shortness of breath. Patient not taking: Reported on 10/08/2018 12/27/17   [name], MD  Alirocumab (PRALUENT) 150 MG/ML SOAJ Inject 1 pen into the skin every 14 (fourteen) days. 08/16/18   Minna Merritts, MD  B Complex-C (B-COMPLEX WITH VITAMIN C) tablet Take 1 tablet by mouth daily.    [provider]  busPIRone (BUSPAR) 10 MG tablet Take 1 tablet (10 mg total) by mouth 3 (three) times daily. 10/04/18   Kathrynn Ducking, MD  diclofenac sodium (VOLTAREN) 1 % GEL APPLY 2-4 GRAMS FOUR TIMES A DAY 10/18/18   Petrarca, Mike Craze, PA-C  doxazosin (CARDURA) 8 MG tablet Take 1 tablet (8 mg total) by mouth 2 (two) times daily. 01/30/18   Minna Merritts, MD  ELIQUIS 5 MG TABS tablet TAKE ONE TABLET BY MOUTH TWICE A DAY 09/26/18   Minna Merritts, MD  FOLIC ACID PO Take by mouth daily.    [provider]  hydrochlorothiazide (HYDRODIURIL) 12.5 MG tablet TAKE ONE TABLET BY MOUTH EVERY  MORNING FOR BLOOD PRESSURE 04/27/18   [name], MD  milk thistle 175 MG tablet Take 175 mg by mouth daily.    [provider]  mirtazapine (REMERON) 15 MG tablet Take 1 tablet (15 mg total) by mouth at bedtime. 09/20/18   [name], MD  Multiple Vitamin (MULTIVITAMIN) tablet Take 1 tablet by mouth daily. Centrum Plus -Take 1 daily    [provider]  nebivolol (BYSTOLIC) 10 MG tablet Take 1 tablet (10 mg total) by mouth 2 (two) times daily. 10/08/18   Minna Merritts, MD  nitroGLYCERIN (NITROSTAT) 0.4 MG SL tablet Place 1 tablet (0.4 mg total) under the tongue every 5 (five) minutes as needed for chest pain. 09/15/17   Minna Merritts, MD  OXISTAT 1 % lotion APPLY TOPICALLY TO THE AFFECTED AREA EVERY DAY AS NEEDED FOR RASH 08/30/18   [provider]  traMADol (ULTRAM) 50 MG tablet TAKE ONE TABLET BY MOUTH EVERY 12 HOURS AS NEEDED FOR MODERATE PAIN 11/20/18   [name], MD  triamcinolone cream (KENALOG) 0.1 % Apply 1 application topically daily as needed.  12/24/14   [provider]  triazolam (HALCION) 0.25 MG tablet TAKE 1 TABLET BY MOUTH 1 HOUR PRIOR TO PROCEDURE. TAKE WITH LIGHT FOOD. DO NOT DRIVE MOTOR VEHICLE. 10/16/18   Magnus Sinning, MD   Family History    Family History  Problem Relation Age of Onset  . Diabetes Father   . Kidney disease Father   . Lung cancer Father        smoker  . Hyperlipidemia Father   . Hypertension Father   . Lung cancer Mother        smoke  . Hypertension Mother   . Colon cancer Neg Hx   . Pancreatic cancer Neg Hx   . Stomach cancer Neg Hx     Social History    Social History   Socioeconomic History  . Marital status: Married    Spouse name: Not on file  . Number of children: 0  . Years of education: hs  . Highest education level: Not on file  Occupational History  . Occupation: Retired    Fish farm manager: Economist  . Occupation: UTILITIES OPER.    Employer: [employer]  Social Needs   . Financial resource strain: Not on file  . Food insecurity    Worry: Not on file    Inability: Not on file  . Transportation needs    Medical: Not on file    Non-medical: Not on file  Tobacco Use  . Smoking status: Current Every Day Smoker    Packs/day: 0.10    Years: 24.00    Pack years: 2.40    Types: Cigarettes  . Smokeless tobacco: Never Used  . Tobacco comment: down to 3  cigs a day.  Substance and Sexual Activity  . Alcohol use: Yes    Frequency: Never    Comment: 4 beers daily   . Drug use: No    Types: Marijuana    Comment: 1970's occ.   Marland Kitchen Sexual activity: Not on file  Lifestyle  . Physical activity    Days per week: Not on file    Minutes per session: Not on file  . Stress: Not on file  Relationships  . Social Herbalist on phone: Not on file    Gets together: Not on file    Attends religious service: Not on file    Active member of club or organization: Not on file    Attends meetings of clubs or organizations: Not on file    Relationship status: Not on file  . Intimate partner violence    Fear of current or ex partner: Not on file    Emotionally abused: Not on file    Physically abused: Not on file    Forced sexual activity: Not on file  Other Topics Concern  . Not on file  Social History Narrative   Lives with wife   Caffeine use: Drinks one cup coffee per morning   No soda or tea    Review of Systems    He denies chest pain, palpitations, dyspnea, pnd, orthopnea, n, v, syncope, weight gain, or early satiety. Fatigue only with  orthopedic injections. Dizzy only during the night when getting up to urinate. Bilateral mild ankle edema only when he takes Tramadol. All other systems reviewed and are otherwise negative except as noted above.  Physical Exam    VS:  BP (!) 150/102 (BP Location: Left Arm, Patient Position: Sitting, Cuff Size: Large)   Pulse 87   Temp 97.7 F (36.5 C)   Ht 6' (1.829 m)   Wt 283 lb 8 oz (128.6 kg)   SpO2 97%    BMI 38.45 kg/m  , BMI Body mass index is 38.45 kg/m. GEN: Obese, in no acute distress.  HEENT: normal. Neck: Supple, obese, difficult to gauge jvp, carotid bruits, or masses. Cardiac: IR, IR, no rubs, or gallops. Grade 2/6 systolic murmur - loudest @ the upper sternal borders, but heard throughout. No clubbing, cyanosis. Trace bilateral ankle edema.  Radials/PT 2+ and equal bilaterally.  Respiratory:  Respirations regular and unlabored, clear to auscultation bilaterally. GI: Soft, nontender, nondistended, BS + x 4. MS: no deformity or atrophy. Skin: warm and dry, no rash. Neuro:  Strength and sensation are intact. Psych: Normal affect.  Accessory Clinical Findings    ECG personally reviewed by me today - Atrial Fibrillation, 87 bpm, Right superior axis deviation  - no acute changes.  Lab Results  Component Value Date   WBC 6.0 08/28/2018   HGB 13.6 08/28/2018   HCT 36.5 (L) 08/28/2018   MCV 108 (H) 08/28/2018   PLT 75 (LL) 08/28/2018   Lab Results  Component Value Date   CREATININE 0.93 09/13/2018   BUN 9 09/13/2018   NA 130 (L) 09/13/2018   K 3.5 09/13/2018   CL 88 (L) 09/13/2018   CO2 20 09/13/2018   Lab Results  Component Value Date   ALT 38 08/28/2018   AST 88 (H) 08/28/2018   GGT 335 (H) 05/09/2017   ALKPHOS 100 08/28/2018   BILITOT 4.0 (H) 08/28/2018   Lab Results  Component Value Date   CHOL 154 08/28/2018   HDL 94 08/28/2018   LDLCALC 47 08/28/2018   TRIG 63 08/28/2018   CHOLHDL 1.6 08/28/2018     Assessment & Plan    1.  Persistent Atrial Fibrillation: Pt w/ a h/o atrial flutter s/p DCCV in 10/2015.  He has been anticoagulated on Eliquis since, in the setting of a CHA2DS2VASc = 2.  He was found to be in afib w/ RVR on 9/28, though he was asymptomatic.  He was not taking his  blocker @ that time and bystolic was re-Rx.  He continues to be in atrial fib, controlled at 87 bpm. Essentially asymptomatic with no chest pain, palpitations, shortness of breath,  or fatigue. Since last visit, he has been taking Bystolic 20 mg in the AM and 10 mg in the PM. Continues to take Eliquis as prescribed. Increased Bystolic to 20 mg twice a day. Discussed risk and benefits of cardioversion to get back into a normal sinus rhythm. He has agreed to have a cardioversion next week. Prior to procedure, he will have labs drawn and COVID test performed. Also, he has agreed to a sleep test to rule out sleep apnea and will be referred to pulmonology. He states his wife reports he snores during the night and has never had a prior study. He denies waking up during the night with the feeling of not be able to breath.   2. Essential hypertension: His blood pressure was elevated during visit, 150/102. Denies headache, blurry vision, or lightheadedness  when blood pressure is elevated. Based on readings from his portable blood pressure monitor, he has more elevated BP's than not. As previously noted , will increase Bystolic. He will continue Cardura 8 mg twice a day.    3. Tobacco Use: He reports he smokes 2 cigarettes a day, usually one at breakfast and a part of one after dinner. Encouraged him to completely stop smoking.   4. Alcohol Abuse: He reports he drinks 4-5 beers during the day and could stop drinking. We discussed that it will likely be difficult for him to maintain sinus rhythm, so long as he is drinking.  Encouraged him to stop drinking alcohol.   5. Coronary Ca2+ and history of demand ischemia:  Pt admitted w/ c/p and mild trop elevation in 02/2018. Echo showed nl EF @ the time and plan was to pursue outpt ischemic testing.  Pt has been asymptomatic from a CV standpoint since then, w/o c/p or dyspnea, however, he has yet to undergo ischemic testing.  Will discuss with him at f/u visit, but we should pursue stress testing to risk stratify, esp in light of development of afib and potential need for antiarrhythmic therapy.  6.  Disposition: Increase Bystolic 20 mg twice a day.  Cardioversion next week with CBC, BMP, and COVID test performed prior to procedure. Follow up in clinic in 3 weeks. Referral for a sleep study to rule out sleep apnea.   Murray Hodgkins, NP 11/22/2018, 1:02 PM

## 2018-11-22 NOTE — Patient Instructions (Signed)
Medication Instructions:  1- INCREASE bystolic Take 1 tablet (20 mg total) by mouth 2 (two) times daily. *If you need a refill on your cardiac medications before your next appointment, please call your pharmacy*  Lab Work: Your physician recommends that you return for lab work in: _____ at : preferred location. (CBC, BMET)  No appt is needed. Hours are M-F 7AM- 6 PM.  - CV19 Pre admit testing DRIVE THRU  Please report to the PAT testing site [facility] on ____11/16_____ date ______12:30-2:30PM_____ time for your DRIVE THRU covid testing that is required prior to your procedure.  Following covid testing, please remain in quarantine. If you must be around others, please wash hands, avoid touching face and wear your mask.    If you have labs (blood work) drawn today and your tests are completely normal, you will receive your results only by: Marland Kitchen MyChart Message (if you have MyChart) OR . A paper copy in the mail If you have any lab test that is abnormal or we need to change your treatment, we will call you to review the results.  Testing/Procedures:  You are scheduled for a Cardioversion on Thursday 11/19 with Dr. Rockey Situ.  Please arrive at the [place] [facility] at 0630 am. (1 hour prior to procedure )  DIET: Nothing to eat or drink after midnight except a sip of water with medications (see medication instructions below)  Medication Instructions: Take Bystolic   Continue your anticoagulant: Eliquis You will need to continue your anticoagulant after your procedure until you are told by your  Provider that it is safe to stop   You must have a responsible person to drive you home and stay in the waiting area during your procedure. Failure to do so could result in cancellation.  Bring your insurance cards.  *Special Note: Every effort is made to have your procedure done on time. Occasionally there are emergencies that occur at the hospital that may cause delays. Please be  patient if a delay does occur.     Follow-Up: At [facility], you and your health needs are our priority.  As part of our continuing mission to provide you with exceptional heart care, we have created designated Provider Care Teams.  These Care Teams include your primary Cardiologist (physician) and Advanced Practice Providers (APPs -  Physician Assistants and Nurse Practitioners) who all work together to provide you with the care you need, when you need it.  Your next appointment:   2 weeks  The format for your next appointment:   In Person  Provider:    You may see Ida Rogue, MD or Murray Hodgkins, NP  Other Instructions Referral to sleep study with pulmonary

## 2018-11-23 ENCOUNTER — Telehealth: Payer: Self-pay

## 2018-11-23 DIAGNOSIS — I4819 Other persistent atrial fibrillation: Secondary | ICD-10-CM | POA: Diagnosis not present

## 2018-11-23 NOTE — Telephone Encounter (Signed)
This was sent to his pharmacy on 11/20/18. Please call to let him know this. Thanks.

## 2018-11-23 NOTE — Telephone Encounter (Signed)
I caled and left a message informing the patient to call the pharamcy because his medicaiton was sent to the pharmacy on 11/20/2018.  Nina,cma

## 2018-11-24 LAB — BASIC METABOLIC PANEL
BUN/Creatinine Ratio: 10 (ref 10–24)
BUN: 10 mg/dL (ref 8–27)
CO2: 24 mmol/L (ref 20–29)
Calcium: 9.1 mg/dL (ref 8.6–10.2)
Chloride: 96 mmol/L (ref 96–106)
Creatinine, Ser: 0.96 mg/dL (ref 0.76–1.27)
GFR calc Af Amer: 98 mL/min/{1.73_m2} (ref >59)
GFR calc non Af Amer: 84 mL/min/{1.73_m2} (ref >59)
Glucose: 111 mg/dL — ABNORMAL HIGH (ref 65–99)
Potassium: 3.9 mmol/L (ref 3.5–5.2)
Sodium: 137 mmol/L (ref 134–144)

## 2018-11-24 LAB — CBC
Hematocrit: 35.6 % — ABNORMAL LOW (ref 37.5–51.0)
Hemoglobin: 13.4 g/dL (ref 13.0–17.7)
MCH: 39.6 pg — ABNORMAL HIGH (ref 26.6–33.0)
MCHC: 37.6 g/dL — ABNORMAL HIGH (ref 31.5–35.7)
MCV: 105 fL — ABNORMAL HIGH (ref 79–97)
Platelets: 66 10*3/uL — CL (ref 150–450)
RBC: 3.38 x10E6/uL — ABNORMAL LOW (ref 4.14–5.80)
RDW: 12.7 % (ref 11.6–15.4)
WBC: 8.1 10*3/uL (ref 3.4–10.8)

## 2018-11-26 ENCOUNTER — Other Ambulatory Visit
Admission: RE | Admit: 2018-11-26 | Discharge: 2018-11-26 | Disposition: A | Discharge status: Home / Self Care | Payer: Medicare Other | Source: Ambulatory Visit | Attending: Cardiovascular Disease | Admitting: Cardiovascular Disease

## 2018-11-26 ENCOUNTER — Telehealth: Payer: Self-pay

## 2018-11-26 ENCOUNTER — Other Ambulatory Visit: Payer: Self-pay

## 2018-11-26 DIAGNOSIS — Z20828 Contact with and (suspected) exposure to other viral communicable diseases: Secondary | ICD-10-CM | POA: Insufficient documentation

## 2018-11-26 DIAGNOSIS — Z01812 Encounter for preprocedural laboratory examination: Secondary | ICD-10-CM | POA: Diagnosis not present

## 2018-11-26 LAB — SARS CORONAVIRUS 2 (TAT 6-24 HRS): SARS Coronavirus 2: NEGATIVE

## 2018-11-26 NOTE — Telephone Encounter (Signed)
Reviewed lab results with patient. No further questions or orders at this time.

## 2018-11-26 NOTE — Telephone Encounter (Signed)
Attempted to call patient. LMTCB 11/26/2018

## 2018-11-26 NOTE — Telephone Encounter (Signed)
-----   Message from Theora Gianotti, NP sent at 11/26/2018  8:40 AM EST ----- H/h stable w/ stable, chronic low platelets. Lytes/renal fxn ok.  Labs acceptable for cardioversion.

## 2018-11-29 ENCOUNTER — Ambulatory Visit
Admission: RE | Admit: 2018-11-29 | Discharge: 2018-11-29 | Disposition: A | Discharge status: Home / Self Care | Payer: Medicare Other | Attending: Cardiovascular Disease | Admitting: Cardiovascular Disease

## 2018-11-29 ENCOUNTER — Encounter
Admission: RE | Disposition: A | Discharge status: Home / Self Care | Payer: Self-pay | Source: Home / Self Care | Attending: Cardiovascular Disease

## 2018-11-29 ENCOUNTER — Ambulatory Visit: Payer: Medicare Other | Admitting: Registered Nurse

## 2018-11-29 ENCOUNTER — Encounter: Payer: Self-pay | Admitting: *Deleted

## 2018-11-29 DIAGNOSIS — E785 Hyperlipidemia, unspecified: Secondary | ICD-10-CM | POA: Diagnosis not present

## 2018-11-29 DIAGNOSIS — Z8546 Personal history of malignant neoplasm of prostate: Secondary | ICD-10-CM | POA: Insufficient documentation

## 2018-11-29 DIAGNOSIS — F419 Anxiety disorder, unspecified: Secondary | ICD-10-CM | POA: Diagnosis not present

## 2018-11-29 DIAGNOSIS — Z79899 Other long term (current) drug therapy: Secondary | ICD-10-CM | POA: Insufficient documentation

## 2018-11-29 DIAGNOSIS — I2510 Atherosclerotic heart disease of native coronary artery without angina pectoris: Secondary | ICD-10-CM | POA: Insufficient documentation

## 2018-11-29 DIAGNOSIS — I4819 Other persistent atrial fibrillation: Secondary | ICD-10-CM | POA: Diagnosis not present

## 2018-11-29 DIAGNOSIS — F17210 Nicotine dependence, cigarettes, uncomplicated: Secondary | ICD-10-CM | POA: Diagnosis not present

## 2018-11-29 DIAGNOSIS — Z7901 Long term (current) use of anticoagulants: Secondary | ICD-10-CM | POA: Insufficient documentation

## 2018-11-29 DIAGNOSIS — F1010 Alcohol abuse, uncomplicated: Secondary | ICD-10-CM | POA: Insufficient documentation

## 2018-11-29 DIAGNOSIS — I10 Essential (primary) hypertension: Secondary | ICD-10-CM | POA: Diagnosis not present

## 2018-11-29 DIAGNOSIS — F329 Major depressive disorder, single episode, unspecified: Secondary | ICD-10-CM | POA: Diagnosis not present

## 2018-11-29 DIAGNOSIS — Z6838 Body mass index (BMI) 38.0-38.9, adult: Secondary | ICD-10-CM | POA: Diagnosis not present

## 2018-11-29 DIAGNOSIS — I25119 Atherosclerotic heart disease of native coronary artery with unspecified angina pectoris: Secondary | ICD-10-CM | POA: Diagnosis not present

## 2018-11-29 DIAGNOSIS — E669 Obesity, unspecified: Secondary | ICD-10-CM | POA: Diagnosis not present

## 2018-11-29 DIAGNOSIS — I4891 Unspecified atrial fibrillation: Secondary | ICD-10-CM | POA: Diagnosis not present

## 2018-11-29 DIAGNOSIS — R0602 Shortness of breath: Secondary | ICD-10-CM | POA: Diagnosis not present

## 2018-11-29 HISTORY — PX: CARDIOVERSION: SHX1299

## 2018-11-29 SURGERY — CARDIOVERSION
Anesthesia: General

## 2018-11-29 MED ORDER — FENTANYL CITRATE (PF) 100 MCG/2ML IJ SOLN: 25.0000 ug | INTRAMUSCULAR | Status: DC | PRN

## 2018-11-29 MED ORDER — SODIUM CHLORIDE 0.9% FLUSH: 3.0000 mL | Freq: Two times a day (BID) | INTRAVENOUS | Status: DC

## 2018-11-29 MED ORDER — SODIUM CHLORIDE 0.9% FLUSH: 3.0000 mL | INTRAVENOUS | Status: DC | PRN

## 2018-11-29 MED ORDER — ONDANSETRON HCL 4 MG/2ML IJ SOLN: 4.0000 mg | Freq: Once | INTRAMUSCULAR | Status: DC | PRN

## 2018-11-29 MED ORDER — PROPOFOL 10 MG/ML IV BOLUS
INTRAVENOUS | Status: DC | PRN
  Administered 2018-11-29: 120 mg via INTRAVENOUS

## 2018-11-29 MED ORDER — SODIUM CHLORIDE 0.9 % IV SOLN
250.0000 mL | INTRAVENOUS | Status: DC
  Administered 2018-11-29: 07:00:00 1000 mL via INTRAVENOUS

## 2018-11-29 MED ORDER — PROPOFOL 10 MG/ML IV BOLUS
INTRAVENOUS | Status: AC
  Filled 2018-11-29: qty 20

## 2018-11-29 NOTE — Discharge Instructions (Signed)
Call Dr Donivan Scull office to shcedule follow up apt in one week.  Dr Donivan Scull office will be setting up a sleep study to assess for sleep apnea. Sleep apnea increases risk of Atrial Fibrillation Alcohol consumption increases risk of Atrial fibrillation. Excess weight increase risk of Atrial Fibrillation. Hypertension increases risk of Atrial Fibrillation.  Electrical Cardioversion, Care After This sheet gives you information about how to care for yourself after your procedure. Your health care provider may also give you more specific instructions. If you have problems or questions, contact your health care provider. What can I expect after the procedure? After the procedure, it is common to have:  Some redness on the skin where the shocks were given. Follow these instructions at home:   Do not drive for 24 hours if you were given a medicine to help you relax (sedative).  Take over-the-counter and prescription medicines only as told by your health care provider.  Ask your health care provider how to check your pulse. Check it often.  Rest for 48 hours after the procedure or as told by your health care provider.  Avoid or limit your caffeine use as told by your health care provider. Contact a health care provider if:  You feel like your heart is beating too quickly or your pulse is not regular.  You have a serious muscle cramp that does not go away. Get help right away if:   You have discomfort in your chest.  You are dizzy or you feel faint.  You have trouble breathing or you are short of breath.  Your speech is slurred.  You have trouble moving an arm or leg on one side of your body.  Your fingers or toes turn cold or blue. This information is not intended to replace advice given to you by your health care provider. Make sure you discuss any questions you have with your health care provider. Document Released: 10/17/2012 Document Revised: 12/09/2016 Document Reviewed:  07/03/2015 Elsevier Patient Education  2020 [employer].

## 2018-11-29 NOTE — Anesthesia Post-op Follow-up Note (Signed)
Anesthesia QCDR form completed.        

## 2018-11-29 NOTE — Anesthesia Postprocedure Evaluation (Signed)
Anesthesia Post Note  Patient: Peter [name]  Procedure(s) Performed: CARDIOVERSION (N/A )  Patient location during evaluation: PACU Anesthesia Type: General Level of consciousness: awake and alert and oriented Pain management: pain level controlled Vital Signs Assessment: post-procedure vital signs reviewed and stable Respiratory status: spontaneous breathing Cardiovascular status: blood pressure returned to baseline Anesthetic complications: no     Last Vitals:  Vitals:   11/29/18 0800 11/29/18 0812  BP: (!) 153/85 (!) 156/90  Pulse: 60 62  Resp: 20 16  Temp:    SpO2: 100% 99%    Last Pain:  Vitals:   11/29/18 0812  PainSc: 0-No pain                 Seldon Barrell

## 2018-11-29 NOTE — Anesthesia Procedure Notes (Signed)
Date/Time: 11/29/2018 7:27 AM Performed by: Doreen Salvage, CRNA Pre-anesthesia Checklist: Patient identified, Emergency Drugs available, Suction available and Patient being monitored Patient Re-evaluated:Patient Re-evaluated prior to induction Oxygen Delivery Method: Nasal cannula Induction Type: IV induction Dental Injury: Teeth and Oropharynx as per pre-operative assessment  Comments: Nasal cannula with etCO2 monitoring

## 2018-11-29 NOTE — Transfer of Care (Signed)
Immediate Anesthesia Transfer of Care Note  Patient: Peter [name]  Procedure(s) Performed: Procedure(s): CARDIOVERSION (N/A)  Patient Location: PACU and Short Stay  Anesthesia Type:General  Level of Consciousness: awake, alert  and oriented  Airway & Oxygen Therapy: Patient Spontanous Breathing and Patient connected to nasal cannula oxygen  Post-op Assessment: Report given to RN and Post -op Vital signs reviewed and stable  Post vital signs: Reviewed and stable  Last Vitals:  Vitals:   11/29/18 0739 11/29/18 0740  BP:  125/64  Pulse: (!) 57   Resp: 16 16  Temp:    SpO2: 123XX123 [id number]    Complications: No apparent anesthesia complications

## 2018-11-29 NOTE — Anesthesia Preprocedure Evaluation (Signed)
Anesthesia Evaluation  Patient identified by MRN, date of birth, ID band Patient awake    Reviewed: Allergy & Precautions, NPO status , Patient's Chart, lab work & pertinent test results, reviewed documented beta blocker date and time   History of Anesthesia Complications Negative for: history of anesthetic complications  Airway Mallampati: II  TM Distance: >3 FB Neck ROM: Full    Dental  (+) Implants   Pulmonary neg sleep apnea, neg COPD, Current Smoker and Patient abstained from smoking.,    breath sounds clear to auscultation- rhonchi (-) wheezing      Cardiovascular hypertension, Pt. on medications and Pt. on home beta blockers + CAD  (-) Past MI, (-) Cardiac Stents and (-) CABG  Rhythm:Irregular Rate:Normal - Systolic murmurs and - Diastolic murmurs NM stress test 08/07/14:  Nuclear stress EF: 64%.  There was no ST segment deviation noted during stress.  This is a low risk study.  The left ventricular ejection fraction is normal (55-65%).   Small, mild partially reversible basal to mid inferolateral defect.  This could be suggestive of ischemia but would also consider soft tissue attenuation.  EF 64% with normal wall motion.   Neuro/Psych neg Seizures PSYCHIATRIC DISORDERS Anxiety Depression  Neuromuscular disease    GI/Hepatic GERD  ,NAFLD   Endo/Other  neg diabetesMorbid obesity  Renal/GU negative Renal ROS     Musculoskeletal  (+) Arthritis , Osteoarthritis,    Abdominal (+) + obese,   Peds  Hematology negative hematology ROS (+)   Anesthesia Other Findings Past Medical History: 09/03/2012: Abnormality of gait No date: Alcohol abuse No date: Anxiety No date: Arthritis No date: Esophageal stricture No date: GERD (gastroesophageal reflux disease) No date: Gout No date: Hyperlipidemia No date: Hypertension No date: Internal hemorrhoids 09/03/2012: Lesion of ulnar nerve     Comment: Right ulnar  neuropathy No date: Murmur, cardiac No date: Palpitations 2016: Prostate cancer ([place])     Comment: treated with radioactive seed implant 03/2008: Tubular adenoma of colon   Reproductive/Obstetrics                             Anesthesia Physical  Anesthesia Plan  ASA: III  Anesthesia Plan: General   Post-op Pain Management:    Induction: Intravenous  PONV Risk Score and Plan: Propofol infusion  Airway Management Planned: Nasal Cannula  Additional Equipment:   Intra-op Plan:   Post-operative Plan:   Informed Consent: I have reviewed the patients History and Physical, chart, labs and discussed the procedure including the risks, benefits and alternatives for the proposed anesthesia with the patient or authorized representative who has indicated his/her understanding and acceptance.     Dental advisory given  Plan Discussed with: CRNA and Anesthesiologist  Anesthesia Plan Comments:         Anesthesia Quick Evaluation

## 2018-11-29 NOTE — H&P (Signed)
H&P Addendum, pre-cardioversion  Patient was seen and evaluated prior to -cardioversion procedure Symptoms, prior testing details again confirmed with the patient Patient examined, no significant change from prior exam Lab work reviewed in detail personally by myself Patient understands risk and benefit of the procedure, willing to proceed  Signed, Tim Gollan, MD, Ph.D [facility]  

## 2018-11-29 NOTE — CV Procedure (Signed)
Cardioversion procedure note For atrial fibrillation, persistent   Procedure Details:  Consent: Risks of procedure as well as the alternatives and risks of each were explained to the (patient/caregiver). Consent for procedure obtained.  Time Out: Verified patient identification, verified procedure, site/side was marked, verified correct patient position, special equipment/implants available, medications/allergies/relevent history reviewed, required imaging and test results available. Performed  Patient placed on cardiac monitor, pulse oximetry, supplemental oxygen as necessary.  Sedation given: propofol IV, Dr. Kayleen Memos Pacer pads placed anterior and posterior chest.  Cardioverted 1 time(s).  Cardioverted at 150 J., 200 J Synchronized biphasic Converted to NSR   Evaluation: Findings: Post procedure EKG shows: NSR Complications: None Patient did tolerate procedure well.  Time Spent Directly with the Patient:  81 minutes   Esmond Plants, M.D., Ph.D.

## 2018-11-30 ENCOUNTER — Telehealth: Payer: Self-pay | Admitting: Cardiovascular Disease

## 2018-11-30 DIAGNOSIS — J301 Allergic rhinitis due to pollen: Secondary | ICD-10-CM | POA: Diagnosis not present

## 2018-11-30 NOTE — Telephone Encounter (Signed)
Patient not feeling well after cardioversion yesterday.  C/o fatigue headache congestion  Chills loss of appetite   Per Wife is this normal post procedure ?

## 2018-11-30 NOTE — Telephone Encounter (Signed)
Spoke with patients wife per release form and reviewed that he could experience some fatigue and headache from medications that they give for sedation. She states that she believes it is allergies and currently has 96.7 temp. Advised to continue monitoring over the weekend to see if he improves. She verbalized understanding with no further questions at this time.

## 2018-12-04 ENCOUNTER — Other Ambulatory Visit: Payer: Self-pay

## 2018-12-04 ENCOUNTER — Ambulatory Visit (INDEPENDENT_AMBULATORY_CARE_PROVIDER_SITE_OTHER): Payer: Medicare Other | Admitting: Nurse Practitioner

## 2018-12-04 ENCOUNTER — Encounter: Payer: Self-pay | Admitting: Nurse Practitioner

## 2018-12-04 VITALS — BP 150/90 | HR 66 | Ht 72.0 in | Wt 286.8 lb

## 2018-12-04 DIAGNOSIS — I10 Essential (primary) hypertension: Secondary | ICD-10-CM | POA: Diagnosis not present

## 2018-12-04 DIAGNOSIS — I6523 Occlusion and stenosis of bilateral carotid arteries: Secondary | ICD-10-CM | POA: Diagnosis not present

## 2018-12-04 DIAGNOSIS — E782 Mixed hyperlipidemia: Secondary | ICD-10-CM | POA: Diagnosis not present

## 2018-12-04 DIAGNOSIS — I4819 Other persistent atrial fibrillation: Secondary | ICD-10-CM | POA: Diagnosis not present

## 2018-12-04 DIAGNOSIS — Z720 Tobacco use: Secondary | ICD-10-CM

## 2018-12-04 DIAGNOSIS — I2584 Coronary atherosclerosis due to calcified coronary lesion: Secondary | ICD-10-CM | POA: Diagnosis not present

## 2018-12-04 DIAGNOSIS — F1010 Alcohol abuse, uncomplicated: Secondary | ICD-10-CM

## 2018-12-04 DIAGNOSIS — I2510 Atherosclerotic heart disease of native coronary artery without angina pectoris: Secondary | ICD-10-CM

## 2018-12-04 NOTE — Patient Instructions (Signed)
Medication Instructions:  Your physician recommends that you continue on your current medications as directed. Please refer to the Current Medication list given to you today.  *If you need a refill on your cardiac medications before your next appointment, please call your pharmacy*  Lab Work: None ordered  If you have labs (blood work) drawn today and your tests are completely normal, you will receive your results only by: Marland Kitchen MyChart Message (if you have MyChart) OR . A paper copy in the mail If you have any lab test that is abnormal or we need to change your treatment, we will call you to review the results.  Testing/Procedures: 1- [place]  Your caregiver has ordered a Stress Test with nuclear imaging. The purpose of this test is to evaluate the blood supply to your heart muscle. This procedure is referred to as a "Non-Invasive Stress Test." This is because other than having an IV started in your vein, nothing is inserted or "invades" your body. Cardiac stress tests are done to find areas of poor blood flow to the heart by determining the extent of coronary artery disease (CAD). Some patients exercise on a treadmill, which naturally increases the blood flow to your heart, while others who are  unable to walk on a treadmill due to physical limitations have a pharmacologic/chemical stress agent called Lexiscan . This medicine will mimic walking on a treadmill by temporarily increasing your coronary blood flow.   Please note: these test may take anywhere between 2-4 hours to complete  PLEASE REPORT TO [place] AT THE FIRST DESK WILL DIRECT YOU WHERE TO GO  Date of Procedure:_____________________________________  Arrival Time for Procedure:______________________________  Instructions regarding medication:   ____ : Hold diabetes medication morning of procedure  ____:  Hold betablocker(s) night before procedure and morning of procedure  ____:  Hold  other medications as follows:_________________________________________________________________________________________________________________________________________________________________________________________________________________________________________________________________________________________  PLEASE NOTIFY THE OFFICE AT LEAST 24 HOURS IN ADVANCE IF YOU ARE UNABLE TO KEEP YOUR APPOINTMENT.  [phone] AND  PLEASE NOTIFY NUCLEAR MEDICINE AT [facility] AT LEAST 24 HOURS IN ADVANCE IF YOU ARE UNABLE TO KEEP YOUR APPOINTMENT. [phone]  How to prepare for your Myoview test:  1. Do not eat or drink after midnight 2. No caffeine for 24 hours prior to test 3. No smoking 24 hours prior to test. 4. Your medication may be taken with water.  If your doctor stopped a medication because of this test, do not take that medication. 5. Ladies, please do not wear dresses.  Skirts or pants are appropriate. Please wear a short sleeve shirt. 6. No perfume, cologne or lotion. 7. Wear comfortable walking shoes. No heels!   Follow-Up: At [facility], you and your health needs are our priority.  As part of our continuing mission to provide you with exceptional heart care, we have created designated Provider Care Teams.  These Care Teams include your primary Cardiologist (physician) and Advanced Practice Providers (APPs -  Physician Assistants and Nurse Practitioners) who all work together to provide you with the care you need, when you need it.  Your next appointment:   3 month(s)  The format for your next appointment:   In Person  Provider:    You may see Ida Rogue, MD or Murray Hodgkins, NP.

## 2018-12-04 NOTE — Progress Notes (Signed)
Office Visit    Patient Name: Peter [name] Date of Encounter: 12/04/2018  Primary Care Provider:  [name], MD Primary Cardiologist:  Ida Rogue, MD  Chief Complaint    63 year old male with a history of hypertension, hyperlipidemia, prostate cancer, coronary calcium on previous CT scan of the chest (July 2016), anxiety, cardiac murmur, atrial flutter status post cardioversion (October 2017), tobacco abuse, and alcohol abuse, who presents for follow-up after recent cardioversion for A. fib.  Past Medical History    Past Medical History:  Diagnosis Date  . Abnormality of gait 09/03/2012  . Alcohol abuse   . Anxiety   . Arthritis   . Arthritis   . Atrial flutter ([place])    a. 10/2015 s/p DCCV.  Marland Kitchen Coronary artery calcification seen on CT scan    a. 07/2014 MV: No ischemia; b. 04/2015 Cardiac CT: Ca2+ 2778. Diffuse, heavy Ca2+/atherosclerosis in LAD, Diag, ost/prox RCA. No significant LCX dzs.  . Demand ischemia ([place])    a. 02/2018 Echo: EF 60-65%, impaired relaxation. Mildly enlarged RV. RVSP 35.  Marland Kitchen Esophageal stricture   . GERD (gastroesophageal reflux disease)   . Gout   . Hepatic steatosis   . Hyperlipidemia    a. Statin intolerant --> on Praluent.  . Hypertension   . Internal hemorrhoids   . Lesion of ulnar nerve 09/03/2012   Right ulnar neuropathy  . Murmur, cardiac   . Palpitations   . Persistent atrial fibrillation ([place])    a.Dx 09/2018-> CHA2DS2VASc = 2-->eliquis; b. 11/2018 s/p DCCV (150J & 200J).  . Prostate cancer ([place]) 2016   treated with radioactive seed implant  . Seizures ([place])    pt states he had seizure 12/14  . Tobacco abuse   . Tubular adenoma of colon 03/2008   Past Surgical History:  Procedure Laterality Date  . CARDIOVERSION N/A 11/29/2018   Procedure: CARDIOVERSION;  Surgeon: Minna Merritts, MD;  Location: [facility] ORS;  Service: Cardiovascular;  Laterality: N/A;  . CATARACT EXTRACTION W/PHACO Right 07/03/2014   Procedure: CATARACT  EXTRACTION PHACO AND INTRAOCULAR LENS PLACEMENT ([place]);  Surgeon: Lyla Glassing, MD;  Location: [facility] ORS;  Service: Ophthalmology;  Laterality: Right;  lot # [id number] H [place]: 00:32.3 AP% 9.1 CDE: 2.92  . CIRCUMCISION    . COLONOSCOPY    . ELBOW SURGERY    . ELECTROPHYSIOLOGIC STUDY N/A 11/02/2015   Procedure: CARDIOVERSION;  Surgeon: Minna Merritts, MD;  Location: [facility] ORS;  Service: Cardiovascular;  Laterality: N/A;  . ESOPHAGOGASTRODUODENOSCOPY (EGD) WITH PROPOFOL N/A 05/17/2016   Procedure: ESOPHAGOGASTRODUODENOSCOPY (EGD) WITH PROPOFOL;  Surgeon: Lucilla Lame, MD;  Location: [facility] ENDOSCOPY;  Service: Endoscopy;  Laterality: N/A;  . EYE SURGERY    . HAND SURGERY Left   . SHOULDER ARTHROSCOPY Left   . SHOULDER SURGERY      Allergies  Allergies  Allergen Reactions  . Hydrocodone Bitartrate Er Other (See Comments)    Makes him "feel funny"  . Clonazepam     dizziness  . Crestor [Rosuvastatin] Other (See Comments)    Bloating, swelling of legs, fatty liver  . Dust Mite Extract   . Gabapentin     Leg twitching at night, ineffective   . Lipitor [Atorvastatin] Other (See Comments)    Myalgias  . Lisinopril Swelling  . Topamax [Topiramate]     Cognitive clouding    History of Present Illness    63 year old male with a history of hypertension, hyperlipidemia, prostate cancer, coronary calcium on previous CT scan  of the chest (July 2016), anxiety, cardiac murmur, atrial flutter status post cardioversion (October 2017), ongoing tobacco abuse, and alcohol abuse.  In February 2020, he was admitted with chest pain and mild troponin elevation.  Echo showed normal LV function and plan was for outpatient ischemic evaluation.  However, in the setting of the Covid pandemic, stress testing was never performed.  In September, he was seen in clinic and was noted to be in atrial fibrillation with a ventricular rate of 127 bpm.  He was asymptomatic.  He had previously been on Bystolic, which he had  come off of, and this was resumed at 10 mg twice daily.  When he was seen in clinic on November 12, he remained in A. fib with improved rate of 87 bpm.  He denied any significant symptoms associated with atrial fibrillation though did note occasional orthostatic lightheadedness.  It turned out that he was taking 20 mg of Bystolic in the morning and 10 mg in the afternoon.  This was titrated to 20 mg twice daily in the setting of hypertension at clinic visit.  He was set up for cardioversion, which was successfully performed on November 19 after 2 synchronized biphasic shocks (150 and 200 J).  Post cardioversion, his wife called on November 20 reported that he was feeling fatigued with headache, but no fevers or chills.  Since his cardioversion, he has felt well.  He remains in sinus rhythm today.  He notes an improvement in his activity tolerance and reduction in fatigue.  He denies chest pain, dyspnea, palpitations, PND, orthopnea, dizziness, syncope, edema, or early satiety.  We spoke today about risk stratification with stress testing in light of admission earlier this year and he wishes to proceed with stress testing.  Home Medications    Prior to Admission medications   Medication Sig Start Date End Date Taking? Authorizing Provider  Alirocumab (PRALUENT) 150 MG/ML SOAJ Inject 1 pen into the skin every 14 (fourteen) days. Patient taking differently: Inject 150 mg into the skin every 14 (fourteen) days.  08/16/18   Minna Merritts, MD  B Complex-C (B-COMPLEX WITH VITAMIN C) tablet Take 1 tablet by mouth daily.    [provider]  busPIRone (BUSPAR) 10 MG tablet Take 1 tablet (10 mg total) by mouth 3 (three) times daily. Patient taking differently: Take 10 mg by mouth 3 (three) times daily as needed (anxiety).  10/04/18   Kathrynn Ducking, MD  diclofenac sodium (VOLTAREN) 1 % GEL APPLY 2-4 GRAMS FOUR TIMES A DAY Patient taking differently: Apply 1 application topically 4 (four) times daily  as needed (back pain).  10/18/18   Cherylann Ratel, PA-C  doxazosin (CARDURA) 8 MG tablet Take 1 tablet (8 mg total) by mouth 2 (two) times daily. 01/30/18   Gollan, Kathlene November, MD  ELIQUIS 5 MG TABS tablet TAKE ONE TABLET BY MOUTH TWICE A DAY Patient taking differently: Take 5 mg by mouth 2 (two) times daily.  09/26/18   Minna Merritts, MD  folic acid (FOLVITE) A999333 MCG tablet Take 800 mcg by mouth daily.    [provider]  Melatonin 5 MG CAPS Take 5 mg by mouth at bedtime as needed (sleep).    [provider]  milk thistle 175 MG tablet Take 175 mg by mouth daily.    [provider]  mirtazapine (REMERON) 15 MG tablet Take 1 tablet (15 mg total) by mouth at bedtime. Patient taking differently: Take 15 mg by mouth at bedtime  as needed (depression).  09/20/18   [name], MD  Multiple Vitamin (MULTIVITAMIN) tablet Take 1 tablet by mouth daily. Centrum Plus -Take 1 daily    [provider]  Naphazoline HCl (CLEAR EYES OP) Place 1 drop into both eyes daily as needed (irritation).    [provider]  Nebivolol HCl 20 MG TABS Take 1 tablet (20 mg total) by mouth 2 (two) times daily. 11/22/18   Theora Gianotti, NP  nitroGLYCERIN (NITROSTAT) 0.4 MG SL tablet Place 1 tablet (0.4 mg total) under the tongue every 5 (five) minutes as needed for chest pain. 09/15/17   Minna Merritts, MD  omeprazole (PRILOSEC OTC) 20 MG tablet Take 20 mg by mouth daily as needed (acid reflux).    [provider]  OXISTAT 1 % lotion Apply 1 application topically daily as needed (rash).  08/30/18   [provider]  traMADol (ULTRAM) 50 MG tablet TAKE ONE TABLET BY MOUTH EVERY 12 HOURS AS NEEDED FOR MODERATE PAIN Patient taking differently: Take 50 mg by mouth every 12 (twelve) hours as needed for moderate pain.  11/20/18   [name], MD  triamcinolone cream (KENALOG) 0.1 % Apply 1 application topically daily as needed (rash).  12/24/14    [provider]  triazolam (HALCION) 0.25 MG tablet TAKE 1 TABLET BY MOUTH 1 HOUR PRIOR TO PROCEDURE. TAKE WITH LIGHT FOOD. DO NOT DRIVE MOTOR VEHICLE. Patient taking differently: Take 0.25 mg by mouth See admin instructions. Take 0.5 mg by mouth 1 hour prior to injections 10/16/18   Magnus Sinning, MD    Review of Systems    He denies chest pain, palpitations, dyspnea, pnd, orthopnea, n, v, dizziness, syncope, edema, weight gain, or early satiety.  All other systems reviewed and are otherwise negative except as noted above.  Physical Exam    VS:  BP (!) 150/90 (BP Location: Left Arm, Patient Position: Sitting, Cuff Size: Normal)   Pulse 66   Ht 6' (1.829 m)   Wt 286 lb 12 oz (130.1 kg)   SpO2 97%   BMI 38.89 kg/m  , BMI Body mass index is 38.89 kg/m. GEN: Well nourished, well developed, in no acute distress. HEENT: normal. Neck: Supple, no JVD, carotid bruits, or masses. Cardiac: RRR, 2/6 systolic murmur, loudest at the upper sternal borders but heard throughout.  No rubs, or gallops. No clubbing, cyanosis.  Trace bilateral ankle edema.  Radials/PT 2+ and equal bilaterally.  Respiratory:  Respirations regular and unlabored, clear to auscultation bilaterally. GI: Soft, nontender, nondistended, BS + x 4. MS: no deformity or atrophy. Skin: warm and dry, no rash. Neuro:  Strength and sensation are intact. Psych: Normal affect.  Accessory Clinical Findings    ECG personally reviewed by me today -regular sinus rhythm, 66, first-degree AV block - no acute changes.  Lab Results  Component Value Date   WBC 8.1 11/23/2018   HGB 13.4 11/23/2018   HCT 35.6 (L) 11/23/2018   MCV 105 (H) 11/23/2018   PLT 66 (LL) 11/23/2018   Lab Results  Component Value Date   CREATININE 0.96 11/23/2018   BUN 10 11/23/2018   NA 137 11/23/2018   K 3.9 11/23/2018   CL 96 11/23/2018   CO2 24 11/23/2018   Lab Results  Component Value Date   ALT 38 08/28/2018   AST 88 (H) 08/28/2018    GGT 335 (H) 05/09/2017   ALKPHOS 100 08/28/2018   BILITOT 4.0 (H) 08/28/2018   Lab  Results  Component Value Date   CHOL 154 08/28/2018   HDL 94 08/28/2018   LDLCALC 47 08/28/2018   TRIG 63 08/28/2018   CHOLHDL 1.6 08/28/2018     Assessment & Plan    1.  Persistent atrial fibrillation: Patient with a history of atrial flutter status post cardioversion in October 2017.  He is anticoagulated with Eliquis in the setting of a CHA2DS2VASc of 2.  He was found to be in A. fib with RVR September 28, though he was asymptomatic.  With resumption of beta-blocker therapy, rate control was achieved and he remained stable on November 12.  At that time, I set him up for cardioversion, which was performed November 19, and was successful.  He is maintaining sinus rhythm today and has noted an improvement in activity tolerance since his cardioversion.  He remains on beta-blocker and Eliquis therapy.  2.  Essential hypertension: Blood pressure elevated today at 150/90, though better at home.  We had increased nebivolol up to 20 mg twice daily on his last visit.  He will continue to follow at home.  He remains on Cardura.  Could consider of amlodipine in the future.  3.  Tobacco abuse: Still smoking 2 cigarettes a day, usually 1 at breakfast and part of 1 after dinner.  Encouraged him to stop completely.  4.  Alcohol abuse: Drinking 4-5 beers during the day but says he can stop drinking.  Encouraged him to stop drinking as alcohol will contribute to recurrence of A. fib.  5.  Coronary calcium and history of demand ischemia: Patient admitted with chest pain and mild troponin elevation in February 2020.  Echo showed normal LV function at the time and plan was to pursue outpatient ischemic testing.  He has been asymptomatic from a cardiovascular standpoint since then, without chest pain or dyspnea, however he has yet to undergo ischemic testing which was put off secondary to COVID-19 pandemic.  We discussed the  importance of follow-up of this issue and risk stratification.  He is agreeable to pursue Lexiscan Myoview.  6.  Disposition: Follow-up stress testing as outlined above.  Follow-up in clinic in 3 months or sooner if necessary.    Murray Hodgkins, NP 12/04/2018, 2:53 PM

## 2018-12-05 DIAGNOSIS — H26492 Other secondary cataract, left eye: Secondary | ICD-10-CM | POA: Diagnosis not present

## 2018-12-05 DIAGNOSIS — J301 Allergic rhinitis due to pollen: Secondary | ICD-10-CM | POA: Diagnosis not present

## 2018-12-13 DIAGNOSIS — J301 Allergic rhinitis due to pollen: Secondary | ICD-10-CM | POA: Diagnosis not present

## 2018-12-17 ENCOUNTER — Encounter
Admission: RE | Admit: 2018-12-17 | Discharge: 2018-12-17 | Disposition: A | Discharge status: Home / Self Care | Payer: Medicare Other | Source: Ambulatory Visit | Attending: Nurse Practitioner | Admitting: Nurse Practitioner

## 2018-12-17 ENCOUNTER — Other Ambulatory Visit: Payer: Self-pay

## 2018-12-17 DIAGNOSIS — I2510 Atherosclerotic heart disease of native coronary artery without angina pectoris: Secondary | ICD-10-CM | POA: Diagnosis not present

## 2018-12-17 DIAGNOSIS — I2584 Coronary atherosclerosis due to calcified coronary lesion: Secondary | ICD-10-CM | POA: Insufficient documentation

## 2018-12-17 LAB — NM MYOCAR MULTI W/SPECT W/WALL MOTION / EF
Estimated workload: 1 METS
Exercise duration (min): 0 min
Exercise duration (sec): 0 s
LV dias vol: 200 mL (ref 62–150)
LV sys vol: 90 mL
MPHR: 158 {beats}/min
Peak HR: 102 {beats}/min
Percent HR: 64 %
Rest HR: 82 {beats}/min
SDS: 6
SRS: 11
SSS: 12
TID: 1.02

## 2018-12-17 MED ORDER — REGADENOSON 0.4 MG/5ML IV SOLN
0.4000 mg | Freq: Once | INTRAVENOUS | Status: AC
  Administered 2018-12-17: 11:00:00 0.4 mg via INTRAVENOUS
  Filled 2018-12-17: qty 5

## 2018-12-17 MED ORDER — TECHNETIUM TC 99M TETROFOSMIN IV KIT
30.0000 | PACK | Freq: Once | INTRAVENOUS | Status: AC | PRN
  Administered 2018-12-17: 34 via INTRAVENOUS

## 2018-12-17 MED ORDER — TECHNETIUM TC 99M TETROFOSMIN IV KIT
10.0000 | PACK | Freq: Once | INTRAVENOUS | Status: AC | PRN
  Administered 2018-12-17: 10.96 via INTRAVENOUS

## 2018-12-18 ENCOUNTER — Other Ambulatory Visit: Payer: Self-pay | Admitting: Family Medicine

## 2018-12-18 ENCOUNTER — Telehealth: Payer: Self-pay

## 2018-12-18 NOTE — Telephone Encounter (Signed)
Call returned to patient to review stress test results. Pt verbalized understanding.   No further orders at this time.   Advised pt to call for any further questions or concerns.

## 2018-12-18 NOTE — Telephone Encounter (Signed)
Attempted to call patient. LMTCB 12/18/2018

## 2018-12-18 NOTE — Telephone Encounter (Signed)
Patient is returning your call.  

## 2018-12-18 NOTE — Telephone Encounter (Signed)
-----   Message from Theora Gianotti, NP sent at 12/18/2018  7:41 AM EST ----- Low risk stress test.  Small area of questionably poor blood flow, though this may be secondary to artifact.  No areas to suggest ongoing blood flow issues.  Overall, reassuring study.

## 2018-12-19 ENCOUNTER — Telehealth: Payer: Self-pay | Admitting: Cardiovascular Disease

## 2018-12-19 ENCOUNTER — Telehealth: Payer: Self-pay | Admitting: Physical Medicine and Rehabilitation

## 2018-12-19 MED ORDER — DOXAZOSIN MESYLATE 8 MG PO TABS: 8.0000 mg | ORAL_TABLET | Freq: Two times a day (BID) | ORAL | 3 refills | Status: AC

## 2018-12-19 MED ORDER — NEBIVOLOL HCL 20 MG PO TABS: 20.0000 mg | ORAL_TABLET | Freq: Two times a day (BID) | ORAL | 3 refills | Status: AC

## 2018-12-19 NOTE — Telephone Encounter (Signed)
Reviewed that we are not able to prescribe this medication three times daily due to limitations of maxiumum dosage of 16 mg daily. He verbalized understanding and then requested just refills of his medication. Instructed him to please let us know if this is not controlling his blood pressures. He verbalized understanding with no further questions at this time.

## 2018-12-19 NOTE — Telephone Encounter (Signed)
Refilled: 11/20/2018 Last OV: 09/18/2018 Next OV: 12/25/2018

## 2018-12-19 NOTE — Telephone Encounter (Signed)
Maximum recommended dose of cardura is 16 mg daily.  I would not recommend taking more.

## 2018-12-19 NOTE — Telephone Encounter (Signed)
Please call to discuss Cardura . States he is taking 3 times a day and needs his rx changed. States he is taking Bystolic also and is working fine.

## 2018-12-19 NOTE — Telephone Encounter (Signed)
Ok to repeat 3 months from last, still should look at consultation with pain mgt and or spine surgery if injections not helping

## 2018-12-20 DIAGNOSIS — J301 Allergic rhinitis due to pollen: Secondary | ICD-10-CM | POA: Diagnosis not present

## 2018-12-20 NOTE — Telephone Encounter (Signed)
Scheduled for 1/7 at 1400 with driver.

## 2018-12-25 ENCOUNTER — Other Ambulatory Visit: Payer: Self-pay

## 2018-12-25 ENCOUNTER — Encounter: Payer: Self-pay | Admitting: Family Medicine

## 2018-12-25 ENCOUNTER — Ambulatory Visit (INDEPENDENT_AMBULATORY_CARE_PROVIDER_SITE_OTHER): Payer: Medicare Other | Admitting: Family Medicine

## 2018-12-25 VITALS — Ht 73.0 in | Wt 271.0 lb

## 2018-12-25 DIAGNOSIS — F320 Major depressive disorder, single episode, mild: Secondary | ICD-10-CM

## 2018-12-25 DIAGNOSIS — I10 Essential (primary) hypertension: Secondary | ICD-10-CM

## 2018-12-25 DIAGNOSIS — M5116 Intervertebral disc disorders with radiculopathy, lumbar region: Secondary | ICD-10-CM

## 2018-12-25 DIAGNOSIS — I483 Typical atrial flutter: Secondary | ICD-10-CM | POA: Diagnosis not present

## 2018-12-25 DIAGNOSIS — K700 Alcoholic fatty liver: Secondary | ICD-10-CM

## 2018-12-25 DIAGNOSIS — R7989 Other specified abnormal findings of blood chemistry: Secondary | ICD-10-CM

## 2018-12-25 DIAGNOSIS — Z720 Tobacco use: Secondary | ICD-10-CM

## 2018-12-25 NOTE — Assessment & Plan Note (Signed)
Much improved.  Continue Remeron and BuSpar.

## 2018-12-25 NOTE — Assessment & Plan Note (Signed)
Adequately controlled at home.  Continue current regimen.

## 2018-12-25 NOTE — Assessment & Plan Note (Signed)
He will continue to try to cut down.

## 2018-12-25 NOTE — Progress Notes (Signed)
Virtual Visit via telephone Note  This visit type was conducted due to national recommendations for restrictions regarding the COVID-19 pandemic (e.g. social distancing).  This format is felt to be most appropriate for this patient at this time.  All issues noted in this document were discussed and addressed.  No physical exam was performed (except for noted visual exam findings with Video Visits).   I connected with Peter [name] today at 12:00 PM EST by telephone and verified that I am speaking with the correct person using two identifiers. Location patient: home Location provider: work  Persons participating in the virtual visit: patient, provider  I discussed the limitations, risks, security and privacy concerns of performing an evaluation and management service by telephone and the availability of in person appointments. I also discussed with the patient that there may be a patient responsible charge related to this service. The patient expressed understanding and agreed to proceed.  Interactive audio and video telecommunications were attempted between this provider and patient, however failed, due to patient having technical difficulties OR patient did not have access to video capability.  We continued and completed visit with audio only.   Reason for visit: follow-up  HPI: Hypertension: Typically 130/85.  Taking amlodipine, Cardura, and nebivolol.  No chest pain or shortness of breath.  No lightheadedness.  A. fib: He underwent cardioversion.  He has had no issues since then.  No palpitations.  Average pulse is 58.  He continues on nebivolol and Eliquis.  Anxiety/depression: He denies anxiety.  No significant depression.  The Remeron is working well.  He continues on BuSpar as well.  No SI.  Chronic back pain: He continues to follow with orthopedics for this.  They are going to do another injection and then if that does not work he will end up seeing pain management in [place].   He tries to only take the tramadol if needed.  He notes constant back pain though no excruciating pain.  Some radiation to his legs.  He has cut down on his cigarettes to 3/day.  He is also down to 2 light beers per day.     ROS: See pertinent positives and negatives per HPI.  Past Medical History:  Diagnosis Date  . Abnormality of gait 09/03/2012  . Alcohol abuse   . Anxiety   . Arthritis   . Arthritis   . Atrial flutter ([place])    a. 10/2015 s/p DCCV.  Marland Kitchen Coronary artery calcification seen on CT scan    a. 07/2014 MV: No ischemia; b. 04/2015 Cardiac CT: Ca2+ 2778. Diffuse, heavy Ca2+/atherosclerosis in LAD, Diag, ost/prox RCA. No significant LCX dzs.  . Demand ischemia ([place])    a. 02/2018 Echo: EF 60-65%, impaired relaxation. Mildly enlarged RV. RVSP 35.  Marland Kitchen Esophageal stricture   . GERD (gastroesophageal reflux disease)   . Gout   . Hepatic steatosis   . Hyperlipidemia    a. Statin intolerant --> on Praluent.  . Hypertension   . Internal hemorrhoids   . Lesion of ulnar nerve 09/03/2012   Right ulnar neuropathy  . Murmur, cardiac   . Palpitations   . Persistent atrial fibrillation ([place])    a.Dx 09/2018-> CHA2DS2VASc = 2-->eliquis; b. 11/2018 s/p DCCV (150J & 200J).  . Prostate cancer ([place]) 2016   treated with radioactive seed implant  . Seizures ([place])    pt states he had seizure 12/14  . Tobacco abuse   . Tubular adenoma of colon 03/2008    Past  Surgical History:  Procedure Laterality Date  . CARDIOVERSION N/A 11/29/2018   Procedure: CARDIOVERSION;  Surgeon: Minna Merritts, MD;  Location: [facility] ORS;  Service: Cardiovascular;  Laterality: N/A;  . CATARACT EXTRACTION W/PHACO Right 07/03/2014   Procedure: CATARACT EXTRACTION PHACO AND INTRAOCULAR LENS PLACEMENT ([place]);  Surgeon: Lyla Glassing, MD;  Location: [facility] OR[facility];  Service: Ophthalmology;  Laterality: Right;  lot # [id number] H [place]: 00:32.3 AP% 9.1 CDE: 2.92  . CIRCUMCISION    . COLONOSCOPY    . ELBOW SURGERY    .  ELECTROPHYSIOLOGIC STUDY N/A 11/02/2015   Procedure: CARDIOVERSION;  Surgeon: Minna Merritts, MD;  Location: [facility] OR[facility];  Service: Cardiovascular;  Laterality: N/A;  . ESOPHAGOGASTRODUODENOSCOPY (EGD) WITH PROPOFOL N/A 05/17/2016   Procedure: ESOPHAGOGASTRODUODENOSCOPY (EGD) WITH PROPOFOL;  Surgeon: Lucilla Lame, MD;  Location: [facility] END[facility];  Service: Endoscopy;  Laterality: N/A;  . EYE SURGERY    . HAND SURGERY Left   . SHOULDER ARTHROSCOPY Left   . SHOULDER SURGERY      Family History  Problem Relation Age of Onset  . Diabetes Father   . Kidney disease Father   . Lung cancer Father        smoker  . Hyperlipidemia Father   . Hypertension Father   . Lung cancer Mother        smoke  . Hypertension Mother   . Colon cancer Neg Hx   . Pancreatic cancer Neg Hx   . Stomach cancer Neg Hx     SOCIAL HX: Smoker   Current Outpatient Medications:  .  Alirocumab (PRALUENT) 150 MG/ML SOAJ, Inject 1 pen into the skin every 14 (fourteen) days. (Patient taking differently: Inject 150 mg into the skin every 14 (fourteen) days. ), Disp: 2 pen, Rfl: 11 .  B Complex-C (B-COMPLEX WITH VITAMIN C) tablet, Take 1 tablet by mouth daily., Disp: , Rfl:  .  busPIRone (BUSPAR) 10 MG tablet, Take 1 tablet (10 mg total) by mouth 3 (three) times daily. (Patient taking differently: Take 10 mg by mouth 3 (three) times daily as needed (anxiety). ), Disp: 270 tablet, Rfl: 3 .  diclofenac sodium (VOLTAREN) 1 % GEL, APPLY 2-4 GRAMS FOUR TIMES A DAY (Patient taking differently: Apply 1 application topically 4 (four) times daily as needed (back pain). ), Disp: 500 g, Rfl: 1 .  doxazosin (CARDURA) 8 MG tablet, Take 1 tablet (8 mg total) by mouth 2 (two) times daily., Disp: 180 tablet, Rfl: 3 .  ELIQUIS 5 MG TABS tablet, TAKE ONE TABLET BY MOUTH TWICE A DAY (Patient taking differently: Take 5 mg by mouth 2 (two) times daily. ), Disp: 180 tablet, Rfl: 1 .  folic acid (FOLVITE) A999333 MCG tablet, Take 800 mcg by mouth daily.,  Disp: , Rfl:  .  Melatonin 5 MG CAPS, Take 5 mg by mouth at bedtime as needed (sleep)., Disp: , Rfl:  .  milk thistle 175 MG tablet, Take 175 mg by mouth daily., Disp: , Rfl:  .  mirtazapine (REMERON) 15 MG tablet, Take 1 tablet (15 mg total) by mouth at bedtime. (Patient taking differently: Take 15 mg by mouth at bedtime as needed (depression). ), Disp: 30 tablet, Rfl: 2 .  Multiple Vitamin (MULTIVITAMIN) tablet, Take 1 tablet by mouth daily. Centrum Plus -Take 1 daily, Disp: , Rfl:  .  Naphazoline HCl (CLEAR EYES OP), Place 1 drop into both eyes daily as needed (irritation)., Disp: , Rfl:  .  Nebivolol HCl 20 MG TABS, Take 1 tablet (20  mg total) by mouth 2 (two) times daily., Disp: 180 tablet, Rfl: 3 .  nitroGLYCERIN (NITROSTAT) 0.4 MG SL tablet, Place 1 tablet (0.4 mg total) under the tongue every 5 (five) minutes as needed for chest pain., Disp: 25 tablet, Rfl: 3 .  omeprazole (PRILOSEC OTC) 20 MG tablet, Take 20 mg by mouth daily as needed (acid reflux)., Disp: , Rfl:  .  OXISTAT 1 % lotion, Apply 1 application topically daily as needed (rash). , Disp: , Rfl:  .  traMADol (ULTRAM) 50 MG tablet, TAKE ONE TABLET BY MOUTH EVERY 12 HOURS AS NEEDED FOR MODERATE PAIN, Disp: 60 tablet, Rfl: 0 .  triamcinolone cream (KENALOG) 0.1 %, Apply 1 application topically daily as needed (rash). , Disp: , Rfl:   Current Facility-Administered Medications:  .  methylPREDNISolone acetate (DEPO-MEDROL) injection 80 mg, 80 mg, Other, Once, Magnus Sinning, MD  EXAM: This was a telehealth telephone visit and thus no physical exam was completed.  ASSESSMENT AND PLAN:  Discussed the following assessment and plan:  Essential hypertension Adequately controlled at home.  Continue current regimen.  Typical atrial flutter (HCC) Asymptomatic following cardioversion.  He will continue his current regimen through cardiology.  Alcohol induced fatty liver Patient has cut down on alcohol intake.  He notes he will not  completely discontinue alcohol use.  We will have him repeat liver function tests in about 2 months.  Depression, major, single episode, mild (HCC) Much improved.  Continue Remeron and BuSpar.  Tobacco abuse He will continue to try to cut down.  Radiculopathy due to lumbar intervertebral disc disorder He will keep his appointment with orthopedics.    I discussed the assessment and treatment plan with the patient. The patient was provided an opportunity to ask questions and all were answered. The patient agreed with the plan and demonstrated an understanding of the instructions.   The patient was advised to call back or seek an in-person evaluation if the symptoms worsen or if the condition fails to improve as anticipated.  I provided 17 minutes of non-face-to-face time during this encounter.   Tommi Rumps, MD

## 2018-12-25 NOTE — Assessment & Plan Note (Signed)
Patient has cut down on alcohol intake.  He notes he will not completely discontinue alcohol use.  We will have him repeat liver function tests in about 2 months.

## 2018-12-25 NOTE — Assessment & Plan Note (Signed)
He will keep his appointment with orthopedics 

## 2018-12-25 NOTE — Assessment & Plan Note (Signed)
Asymptomatic following cardioversion.  He will continue his current regimen through cardiology.

## 2018-12-31 ENCOUNTER — Other Ambulatory Visit: Payer: Self-pay | Admitting: Cardiovascular Disease

## 2019-01-01 ENCOUNTER — Telehealth: Payer: Self-pay | Admitting: *Deleted

## 2019-01-01 NOTE — Telephone Encounter (Signed)
Pt lvm stating that he would like to have pain medication, spoke with Dr. Ernestina Patches and he advised pt to go to their PCP for pain medication and to be referred to pain management. Advised to pt and he understood and states that he only gets tramadol and that it is not helping and that he will just wait until injection 01/17/2019.

## 2019-01-15 ENCOUNTER — Telehealth: Payer: Self-pay | Admitting: Physical Medicine and Rehabilitation

## 2019-01-15 ENCOUNTER — Other Ambulatory Visit: Payer: Self-pay | Admitting: Physical Medicine and Rehabilitation

## 2019-01-15 DIAGNOSIS — F411 Generalized anxiety disorder: Secondary | ICD-10-CM

## 2019-01-15 MED ORDER — TRIAZOLAM 0.25 MG PO TABS: ORAL_TABLET | ORAL | 0 refills | Status: AC

## 2019-01-15 NOTE — Progress Notes (Unsigned)
Pre-procedure diazepam ordered for pre-operative anxiety.  

## 2019-01-17 ENCOUNTER — Encounter: Payer: Self-pay | Admitting: Physical Medicine and Rehabilitation

## 2019-01-17 ENCOUNTER — Ambulatory Visit: Payer: Self-pay

## 2019-01-17 ENCOUNTER — Other Ambulatory Visit: Payer: Self-pay

## 2019-01-17 ENCOUNTER — Ambulatory Visit (INDEPENDENT_AMBULATORY_CARE_PROVIDER_SITE_OTHER): Payer: Medicare Other | Admitting: Physical Medicine and Rehabilitation

## 2019-01-17 VITALS — BP 127/84 | HR 73

## 2019-01-17 DIAGNOSIS — M5116 Intervertebral disc disorders with radiculopathy, lumbar region: Secondary | ICD-10-CM | POA: Diagnosis not present

## 2019-01-17 MED ORDER — METHYLPREDNISOLONE ACETATE 80 MG/ML IJ SUSP: 40.0000 mg | Freq: Once | INTRAMUSCULAR | Status: AC

## 2019-01-17 NOTE — Progress Notes (Signed)
Pt states pain in the lower back and radiates into both legs all the way down. Pt states last injection helped out a lot and lasted for 3 months. Walking and standing makes pain worse. Massage sessions helps with pain.   .Numeric Pain Rating Scale and Functional Assessment Average Pain 7   In the last MONTH (on 0-10 scale) has pain interfered with the following?  1. General activity like being  able to carry out your everyday physical activities such as walking, climbing stairs, carrying groceries, or moving a chair?  Rating(7)   +Driver, +BT(Eliquis) ok for injection, -Dye Allergies.

## 2019-01-18 NOTE — Telephone Encounter (Signed)
Rx sent to patient's pharmacy

## 2019-01-31 ENCOUNTER — Other Ambulatory Visit: Payer: Self-pay | Admitting: Family Medicine

## 2019-02-06 ENCOUNTER — Telehealth: Payer: Self-pay | Admitting: Internal Medicine

## 2019-02-06 NOTE — Telephone Encounter (Signed)
Dr. Caryl Bis out of the office week of 02/04/19  [place] covering FNP but unable to sign death certificate   [facility] dropped off death certificate today  Per [facility] office sparks called [phone] Dr. Sharlet Salina which was name stated and circumstances surrounding death but there is no note of details of death  Reached out to office sparks and left message today 6:17 pm 18-Feb-2019   Will sign death certificate but need to know circumstances surrounding death  Will sign on behalf of PCP    [place]

## 2019-02-08 ENCOUNTER — Telehealth: Payer: Self-pay | Admitting: Internal Medicine
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
## 2019-02-11 DEATH — deceased

## 2019-03-04 ENCOUNTER — Ambulatory Visit: Payer: Medicare Other | Admitting: Cardiovascular Disease

## 2019-06-26 ENCOUNTER — Ambulatory Visit: Payer: Medicare Other | Admitting: Family Medicine
